# Patient Record
Sex: Female | Born: 1940 | Race: White | Hispanic: No | Smoking: Former smoker
Health system: Southern US, Community
[De-identification: ages and names within clinical notes are randomized; demographics above are authoritative.]

## PROBLEM LIST (undated history)

## (undated) DIAGNOSIS — F32A Depression, unspecified: Secondary | ICD-10-CM

## (undated) DIAGNOSIS — Z9889 Other specified postprocedural states: Secondary | ICD-10-CM

## (undated) DIAGNOSIS — J302 Other seasonal allergic rhinitis: Secondary | ICD-10-CM

## (undated) DIAGNOSIS — I10 Essential (primary) hypertension: Secondary | ICD-10-CM

## (undated) DIAGNOSIS — G8929 Other chronic pain: Secondary | ICD-10-CM

## (undated) DIAGNOSIS — R519 Headache, unspecified: Secondary | ICD-10-CM

## (undated) DIAGNOSIS — M1990 Unspecified osteoarthritis, unspecified site: Secondary | ICD-10-CM

## (undated) DIAGNOSIS — M545 Low back pain, unspecified: Secondary | ICD-10-CM

## (undated) DIAGNOSIS — E7800 Pure hypercholesterolemia, unspecified: Secondary | ICD-10-CM

## (undated) DIAGNOSIS — R51 Headache: Secondary | ICD-10-CM

## (undated) DIAGNOSIS — R112 Nausea with vomiting, unspecified: Secondary | ICD-10-CM

## (undated) DIAGNOSIS — F419 Anxiety disorder, unspecified: Secondary | ICD-10-CM

## (undated) DIAGNOSIS — R0600 Dyspnea, unspecified: Secondary | ICD-10-CM

## (undated) DIAGNOSIS — N183 Chronic kidney disease, stage 3 (moderate): Secondary | ICD-10-CM

## (undated) DIAGNOSIS — J189 Pneumonia, unspecified organism: Secondary | ICD-10-CM

## (undated) DIAGNOSIS — K219 Gastro-esophageal reflux disease without esophagitis: Secondary | ICD-10-CM

## (undated) DIAGNOSIS — E039 Hypothyroidism, unspecified: Secondary | ICD-10-CM

## (undated) DIAGNOSIS — Z8719 Personal history of other diseases of the digestive system: Secondary | ICD-10-CM

## (undated) DIAGNOSIS — N390 Urinary tract infection, site not specified: Secondary | ICD-10-CM

## (undated) DIAGNOSIS — E119 Type 2 diabetes mellitus without complications: Secondary | ICD-10-CM

## (undated) DIAGNOSIS — Z9289 Personal history of other medical treatment: Secondary | ICD-10-CM

## (undated) HISTORY — PX: CATARACT EXTRACTION W/ INTRAOCULAR LENS  IMPLANT, BILATERAL: SHX1307

## (undated) HISTORY — PX: TONSILLECTOMY: SUR1361

## (undated) HISTORY — PX: DILATION AND CURETTAGE OF UTERUS: SHX78

## (undated) HISTORY — DX: Unspecified osteoarthritis, unspecified site: M19.90

## (undated) HISTORY — PX: BREAST BIOPSY: SHX20

## (undated) HISTORY — DX: Gastro-esophageal reflux disease without esophagitis: K21.9

## (undated) HISTORY — PX: BACK SURGERY: SHX140

## (undated) HISTORY — PX: POSTERIOR CERVICAL FUSION/FORAMINOTOMY: SHX5038

---
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
## 1966-10-01 HISTORY — PX: PARTIAL HYSTERECTOMY: SHX80

## 1997-12-06 ENCOUNTER — Ambulatory Visit (HOSPITAL_COMMUNITY): Admission: RE | Admit: 1997-12-06 | Discharge: 1997-12-06 | Payer: Self-pay | Admitting: Obstetrics

## 1997-12-15 ENCOUNTER — Ambulatory Visit (HOSPITAL_COMMUNITY): Admission: RE | Admit: 1997-12-15 | Discharge: 1997-12-15 | Payer: Self-pay | Admitting: Obstetrics

## 1998-03-02 ENCOUNTER — Emergency Department (HOSPITAL_COMMUNITY): Admission: EM | Admit: 1998-03-02 | Discharge: 1998-03-02 | Payer: Self-pay | Admitting: Emergency Medicine

## 2000-05-30 ENCOUNTER — Ambulatory Visit (HOSPITAL_COMMUNITY): Admission: RE | Admit: 2000-05-30 | Discharge: 2000-05-30 | Payer: Self-pay | Admitting: *Deleted

## 2004-11-17 ENCOUNTER — Emergency Department (HOSPITAL_COMMUNITY): Admission: EM | Admit: 2004-11-17 | Discharge: 2004-11-17 | Payer: Self-pay | Admitting: Emergency Medicine

## 2005-10-10 ENCOUNTER — Ambulatory Visit: Payer: Self-pay | Admitting: Gastroenterology

## 2005-10-25 ENCOUNTER — Ambulatory Visit: Payer: Self-pay | Admitting: Gastroenterology

## 2005-10-25 HISTORY — PX: COLONOSCOPY: SHX174

## 2007-06-27 ENCOUNTER — Inpatient Hospital Stay (HOSPITAL_COMMUNITY): Admission: RE | Admit: 2007-06-27 | Discharge: 2007-06-29 | Payer: Self-pay | Admitting: Orthopaedic Surgery

## 2011-02-13 NOTE — Op Note (Signed)
NAME:  Amber Gray, Amber Gray           ACCOUNT NO.:  [id number]   MEDICAL RECORD NO.:  [id number]          PATIENT TYPE:  INP   LOCATION:  5038                         FACILITY:  [facility]   PHYSICIAN:  Mark C. Ophelia Charter, M.D.    DATE OF BIRTH:  June 24, 1941   DATE OF PROCEDURE:  06/27/2007  DATE OF DISCHARGE:  06/29/2007                               OPERATIVE REPORT   PREOPERATIVE DIAGNOSIS:  Severe spinal stenosis, C4-5, C5-6 with C6-7  herniated nucleus pulposus.   POSTOPERATIVE DIAGNOSIS:  Severe spinal stenosis, C4-5, C5-6 with C6-7  herniated nucleus pulposus.   PROCEDURE:  C5 corpectomy.  C4-5 and C6-7 microdiskectomy and  decompression.  C6-7 diskectomy and fusion.  Iliac crest structural  graft for C4-C6 fusion and iliac crest bone graft for C6-7 fusion.  C4-  C7 anterior cervical plating and screws.   SURGEON:  Mark C. Ophelia Charter, M.D.   ASSISTANT:  Maud Deed, PA-C   ANESTHESIA:  General plus local.   SKIN CLOSURE:  Subcuticular.   DRAINS:  One Hemovac drain in iliac crest, one Hemovac drain in anterior  neck.   This 70 year old female has had progressive spondylosis with stenosis  and has stenosis 4/5 to 6 mm away from the C4-5, C5-6 level.  She had a  focal disk herniation at C6-7 with mild-to-moderate spondylosis at this  level.  Due to the long area of stenosis, simple diskectomy alone was  not elected, and the patient required corpectomy in order to decompress  the areas of stenosis for decompression of the cervical spinal cord.   PROCEDURE:  After induction of general anesthesia, head halter traction,  horseshoe head holder, arms tucked to the side, a gel bag underneath the  left iliac crest, standard prepping and draping was performed.  Preoperative antibiotics were given.  Timeout procedure was taken after  draping.  A sterile skin marker had been used on the neck and Betadine  Vi-Drape.  Sterile Mayo stand at the head and thyroid sheets and drapes.   Incision was  made with transverse incision starting at the midline and  extending to the left.  The platysma was divided in line with the  fibers.  Dissection down to the level of longus colli was performed, and  considerable undermining underneath the platysma was performed since  this required a three-level exposure.  Cross-table lateral C-arm was  used after draping for confirmation of the appropriate level.  Diskectomy was performed at C4-5 and C5-6.  The operative microscope was  draped and brought in.  The bur was used.  A combination of bone wax,  FloSeal, and thrombin-soaked Gelfoam was used for hemostasis.  Corpectomy was performed using a combination of the bur after diskectomy  was performed on each end.  The posterior longitudinal ligament was  taken down using the operative microscope, and then the bone of C5 was  burred away.  Once the bone was thinned down to the posterior cortex, it  was removed with a Kerrison rongeur, with care taken to make sure that  the dura was free and was not adherent.  There was significant bone spur  overlap at C4-5 and C5-6 and ligaments and spurs removed.  The  uncovertebral joints were stripped with the Cloward curettes as well as  the Carlen curettes.  Once the dura was completely exposed after the  corpectomy and spurs removed at the C4-5 and C5-6 level, a larger patty  was placed moist over the dura.  After irrigation, diskectomy was  performed at C6-7 level.  At this level, the disk was herniated and a  portion of it extruded through the ligament. Once the ligament was taken  down, all disk was removed.  Spurs removed with 1 and 2-mm Kerrisons.  After the dura was decompressed, an incision was made over the left  iliac crest.  A bone graft was harvested initially based on measurements  made in the neck for the single large corpectomy level.  Bone was cut a  little extra long so that the single level at C6-7 could be harvested  from that and was trimmed.   Bone plugs were placed at the C6-7 level,  with the good stability tricortical based on depth gauge measurement.  Bone plug had to be thinned slightly with the power bur and burred down  on the ends gradually until finally with the Halter traction it would  fit in.  Care was taken not to make it loose and also to make sure that  there was no overdistraction when it was inserted.  Basically about 3 mm  of extra length from the measurements with mild traction was used.  This  gave a good restoration of the original height, considering there had  been collapse with the spondylosis.  The graft was secure.  The patty  had been removed from the dura and field was dry and dura was intact.  Palpation with a black nerve hook along the edges showed no areas of  remaining compression.  After graft was secure, the plate was selected  and screws were inserted.  A Vue[employer] EBI plate was selected.  No  screws were placed in the corpectomy graft.  C-arm was used, AP and  lateral, to confirm good placement of the plate, screws, and corpectomy  graft as well as the C6-7 graft.  After irrigation with saline solution,  a Hemovac was placed with the in-and-out technique using the trocar in  line with the skin incision.  The platysma was closed with 3-0 Vicryl  and 4-0 Vicryl subcuticular closure.  Tincture of Benzoin, Steri-Strips.  Iliac crest had a drain placed.  Edges of the bone graft that was  harvested were rounded and smoothed.  Deep fascia closed with 0 Vicryl  and 2-0 in the subcutaneous tissue, subcuticular skin closure, and  postop dressing.  Instrument and needle count was correct.  The patient  was transferred to the recovery room and was neurologically intact in  the recovery room.      Mark C. Ophelia Charter, M.D.  Electronically Signed     MCY/MEDQ  D:  08/01/2007  T:  08/01/2007  Job:  [id number]

## 2011-07-12 LAB — APTT: aPTT: 30

## 2011-07-12 LAB — BASIC METABOLIC PANEL
BUN: 10
BUN: 5 — ABNORMAL LOW
CO2: 22
CO2: 26
Calcium: 9.2
Chloride: 106
Chloride: 108
Creatinine, Ser: 0.83
GFR calc Af Amer: >60
GFR calc non Af Amer: >60
Glucose, Bld: 110 — ABNORMAL HIGH
Glucose, Bld: 140 — ABNORMAL HIGH
Potassium: 3.9
Potassium: 4.2
Sodium: 140

## 2011-07-12 LAB — URINALYSIS, ROUTINE W REFLEX MICROSCOPIC
Ketones, ur: NEGATIVE
Nitrite: NEGATIVE
Protein, ur: NEGATIVE
Urobilinogen, UA: 0.2
pH: 6.5

## 2011-07-12 LAB — DIFFERENTIAL
Basophils Absolute: 0
Lymphocytes Relative: 32
Monocytes Absolute: 0.5
Neutro Abs: 5.4

## 2011-07-12 LAB — CBC
Hemoglobin: 14
RBC: 4.54
RDW: 13

## 2011-07-12 LAB — URINE MICROSCOPIC-ADD ON

## 2012-09-25 ENCOUNTER — Observation Stay (HOSPITAL_COMMUNITY)
Admission: EM | Admit: 2012-09-25 | Discharge: 2012-09-26 | Disposition: A | Discharge status: Home / Self Care | Payer: Medicare HMO | Attending: Emergency Medicine | Admitting: Emergency Medicine

## 2012-09-25 ENCOUNTER — Emergency Department (HOSPITAL_COMMUNITY): Payer: Medicare HMO

## 2012-09-25 ENCOUNTER — Encounter (HOSPITAL_COMMUNITY): Payer: Self-pay | Admitting: *Deleted

## 2012-09-25 DIAGNOSIS — I10 Essential (primary) hypertension: Secondary | ICD-10-CM | POA: Insufficient documentation

## 2012-09-25 DIAGNOSIS — F411 Generalized anxiety disorder: Secondary | ICD-10-CM | POA: Insufficient documentation

## 2012-09-25 DIAGNOSIS — E039 Hypothyroidism, unspecified: Secondary | ICD-10-CM | POA: Insufficient documentation

## 2012-09-25 DIAGNOSIS — R300 Dysuria: Secondary | ICD-10-CM | POA: Insufficient documentation

## 2012-09-25 DIAGNOSIS — E785 Hyperlipidemia, unspecified: Secondary | ICD-10-CM | POA: Insufficient documentation

## 2012-09-25 DIAGNOSIS — Z79899 Other long term (current) drug therapy: Secondary | ICD-10-CM | POA: Insufficient documentation

## 2012-09-25 DIAGNOSIS — R5381 Other malaise: Secondary | ICD-10-CM | POA: Insufficient documentation

## 2012-09-25 DIAGNOSIS — R079 Chest pain, unspecified: Principal | ICD-10-CM | POA: Insufficient documentation

## 2012-09-25 DIAGNOSIS — R5383 Other fatigue: Secondary | ICD-10-CM | POA: Insufficient documentation

## 2012-09-25 HISTORY — DX: Essential (primary) hypertension: I10

## 2012-09-25 HISTORY — DX: Pure hypercholesterolemia, unspecified: E78.00

## 2012-09-25 HISTORY — DX: Hypothyroidism, unspecified: E03.9

## 2012-09-25 LAB — URINALYSIS, ROUTINE W REFLEX MICROSCOPIC
Glucose, UA: NEGATIVE mg/dL
Hgb urine dipstick: NEGATIVE
Ketones, ur: 15 mg/dL — AB
Protein, ur: NEGATIVE mg/dL
Urobilinogen, UA: 1 mg/dL (ref 0.0–1.0)

## 2012-09-25 LAB — POCT I-STAT TROPONIN I
Troponin i, poc: 0 ng/mL (ref 0.00–0.08)
Troponin i, poc: 0 ng/mL (ref 0.00–0.08)

## 2012-09-25 LAB — COMPREHENSIVE METABOLIC PANEL
Alkaline Phosphatase: 67 U/L (ref 39–117)
BUN: 11 mg/dL (ref 6–23)
CO2: 20 mEq/L (ref 19–32)
Calcium: 9.2 mg/dL (ref 8.4–10.5)
GFR calc Af Amer: >90 mL/min (ref >90)
GFR calc non Af Amer: 81 mL/min — ABNORMAL LOW (ref >90)
Glucose, Bld: 110 mg/dL — ABNORMAL HIGH (ref 70–99)
Potassium: 3.7 mEq/L (ref 3.5–5.1)
Total Protein: 8.1 g/dL (ref 6.0–8.3)

## 2012-09-25 LAB — CBC WITH DIFFERENTIAL/PLATELET
Eosinophils Absolute: 0.1 10*3/uL (ref 0.0–0.7)
Eosinophils Relative: 1 % (ref 0–5)
HCT: 40.5 % (ref 36.0–46.0)
Hemoglobin: 14.3 g/dL (ref 12.0–15.0)
Lymphocytes Relative: 31 % (ref 12–46)
Lymphs Abs: 2.7 10*3/uL (ref 0.7–4.0)
MCH: 31 pg (ref 26.0–34.0)
MCV: 87.9 fL (ref 78.0–100.0)
Monocytes Relative: 7 % (ref 3–12)
RBC: 4.61 MIL/uL (ref 3.87–5.11)

## 2012-09-25 LAB — URINE MICROSCOPIC-ADD ON

## 2012-09-25 LAB — TROPONIN I: Troponin I: <0.3 ng/mL (ref <0.30)

## 2012-09-25 MED ORDER — CIPROFLOXACIN IN D5W 400 MG/200ML IV SOLN
400.0000 mg | Freq: Once | INTRAVENOUS | Status: AC
  Administered 2012-09-25: 400 mg via INTRAVENOUS
  Filled 2012-09-25: qty 200

## 2012-09-25 MED ORDER — ZOLPIDEM TARTRATE 5 MG PO TABS: 5.0000 mg | ORAL_TABLET | Freq: Every evening | ORAL | Status: DC | PRN

## 2012-09-25 MED ORDER — MORPHINE SULFATE 4 MG/ML IJ SOLN
4.0000 mg | Freq: Once | INTRAMUSCULAR | Status: AC
  Administered 2012-09-25: 4 mg via INTRAVENOUS
  Filled 2012-09-25: qty 1

## 2012-09-25 MED ORDER — ASPIRIN 325 MG PO TABS
325.0000 mg | ORAL_TABLET | Freq: Once | ORAL | Status: AC
  Administered 2012-09-25: 325 mg via ORAL
  Filled 2012-09-25: qty 1

## 2012-09-25 MED ORDER — ALPRAZOLAM 0.5 MG PO TABS
0.5000 mg | ORAL_TABLET | Freq: Once | ORAL | Status: AC
  Administered 2012-09-26: 0.5 mg via ORAL
  Filled 2012-09-25: qty 1

## 2012-09-25 NOTE — ED Notes (Signed)
Dr Rhunette Croft notified that pt took home medication and has requested something to help her sleep tonight.

## 2012-09-25 NOTE — ED Provider Notes (Signed)
History     CSN: [id number]  Arrival date & time 09/25/12  1129   First MD Initiated Contact with Patient 09/25/12 1301      Chief Complaint  Patient presents with  . Weakness  . Shortness of Breath    (Consider location/radiation/quality/duration/timing/severity/associated sxs/prior treatment) The history is provided by the patient.  Amber Gray is a 71 y.o. female hx of HTN, HL here with SOB, chest pain. She felt weak for several days. + SOB since yesterday. She said that she gets these symptoms during holidays and felt anxious. No hx of CAD or stents. No cardiologist. + dysuria but no fevers. Patient not suicidal or homicidal.     Past Medical History  Diagnosis Date  . Hypertension   . Hypercholesteremia   . Hypothyroid     History reviewed. No pertinent past surgical history.  History reviewed. No pertinent family history.  History  Substance Use Topics  . Smoking status: Never Smoker   . Smokeless tobacco: Not on file  . Alcohol Use: No    OB History    Grav Para Term Preterm Abortions TAB SAB Ect Mult Living                  Review of Systems  Respiratory: Positive for shortness of breath.   Neurological: Positive for weakness.  All other systems reviewed and are negative.    Allergies  Review of patient's allergies indicates no known allergies.  Home Medications   Current Outpatient Rx  Name  Route  Sig  Dispense  Refill  . ALBUTEROL SULFATE HFA 108 (90 BASE) MCG/ACT IN AERS   Inhalation   Inhale 2 puffs into the lungs every 6 (six) hours as needed. For wheezing.         Marland Kitchen ALPRAZOLAM 0.5 MG PO TABS   Oral   Take 0.25-0.5 mg by mouth at bedtime as needed. For sleep.         . OMEGA-3 180-270 MG PO CAPS   Oral   Take 1 capsule by mouth daily.         Marland Kitchen LEVOTHYROXINE SODIUM 75 MCG PO TABS   Oral   Take 75 mcg by mouth daily.         Marland Kitchen LISINOPRIL 20 MG PO TABS   Oral   Take 20 mg by mouth daily.         Marland Kitchen SIMVASTATIN  20 MG PO TABS   Oral   Take 20 mg by mouth daily.           BP 151/62  Pulse 64  Temp 98.2 F (36.8 C)  Resp 14  SpO2 99%  Physical Exam  Nursing note and vitals reviewed. Constitutional: She is oriented to person, place, and time. She appears well-developed.       Anxious, crying   HENT:  Head: Normocephalic.  Mouth/Throat: Oropharynx is clear and moist.  Eyes: Conjunctivae normal are normal. Pupils are equal, round, and reactive to light.  Neck: Normal range of motion. Neck supple.  Cardiovascular: Normal rate, regular rhythm and normal heart sounds.   Pulmonary/Chest: Effort normal and breath sounds normal. No respiratory distress.       ? Crackles on bases   Abdominal: Soft. Bowel sounds are normal. She exhibits no distension. There is no tenderness. There is no rebound.  Musculoskeletal: Normal range of motion.  Neurological: She is alert and oriented to person, place, and time.  Skin: Skin is warm and dry.  Psychiatric: She has a normal mood and affect. Her behavior is normal. Judgment and thought content normal.    ED Course  Procedures (including critical care time)  Labs Reviewed  COMPREHENSIVE METABOLIC PANEL - Abnormal; Notable for the following:    Glucose, Bld 110 (*)     GFR calc non Af Amer 81 (*)     All other components within normal limits  PRO B NATRIURETIC PEPTIDE - Abnormal; Notable for the following:    Pro B Natriuretic peptide (BNP) 133.3 (*)     All other components within normal limits  URINALYSIS, ROUTINE W REFLEX MICROSCOPIC - Abnormal; Notable for the following:    Ketones, ur 15 (*)     Leukocytes, UA MODERATE (*)     All other components within normal limits  URINE MICROSCOPIC-ADD ON - Abnormal; Notable for the following:    Squamous Epithelial / LPF FEW (*)     Bacteria, UA FEW (*)     All other components within normal limits  CBC WITH DIFFERENTIAL  TROPONIN I  URINE CULTURE   Dg Chest 2 View  09/25/2012  *RADIOLOGY REPORT*   Clinical Data: Weakness and short of breath  CHEST - 2 VIEW  Comparison: 06/26/2007  Findings: Heart size is mildly enlarged.  Negative for heart failure.  The lungs are clear without infiltrate or effusion or mass lesion.  Mild degenerative changes in the thoracic spine. Chronic fracture approximately T12 is unchanged.  IMPRESSION: No active cardiopulmonary abnormality.   Original Report Authenticated By: Janeece Riggers, M.D.      No diagnosis found.   Date: 09/25/2012  Rate: 86  Rhythm: normal sinus rhythm  QRS Axis: normal  Intervals: normal  ST/T Wave abnormalities: nonspecific ST changes  Conduction Disutrbances:none  Narrative Interpretation:   Old EKG Reviewed: none available    MDM  Amber Gray is a 71 y.o. female here with SOB, chest pain. Likely related to anxiety. Will get trop and BNP given crackles in lung bases to r/o CHF. Will reassess.   4:27 PM Patient tearful in the room. Trop neg x 1. CXR nl. BNP nl. She has UTI on UA and is given cipro. I still think she is likely to have anxiety related chest pain but she will need further workup in the CDU. I ordered CT angio coronary to be done tomorrow. I gave report to CDU PA.          Richardean Canal, MD 09/25/12 [phone]

## 2012-09-25 NOTE — ED Notes (Signed)
Daughter at bedside patient seems anxious instructed on breathing techniques

## 2012-09-25 NOTE — ED Notes (Signed)
Report taken from Robin, RN.

## 2012-09-25 NOTE — ED Notes (Signed)
Pt denies pain currently; pt denies numbness and tingling; pt mentating appropriately. Pt states shortness of breath is better. Pt states she took her medication from home--pt showed bottle that pill was taken from simvastatin 20mg ; pt states one pill taken.

## 2012-09-25 NOTE — ED Notes (Signed)
Pt very vague.  Reports that her mother passed away in 06-Mar-2004 and she feels like she is doing the same.  States that she has been feeling weak and SOB x 2 days.  Pt very anxious.  NAD noted.  Pt denies pain.  Pt ambulatory without difficulty, speaking in complete sentences.

## 2012-09-25 NOTE — ED Notes (Signed)
Dr Rhunette Croft notified that pt normally takes Xanax at home to help sleep as needed. Rhunette Croft states he will change the order

## 2012-09-26 ENCOUNTER — Observation Stay (HOSPITAL_COMMUNITY): Payer: Medicare HMO

## 2012-09-26 MED ORDER — OMEPRAZOLE 20 MG PO CPDR: 20.0000 mg | DELAYED_RELEASE_CAPSULE | Freq: Two times a day (BID) | ORAL | Status: DC

## 2012-09-26 MED ORDER — METOPROLOL TARTRATE 25 MG PO TABS
50.0000 mg | ORAL_TABLET | Freq: Once | ORAL | Status: AC
  Administered 2012-09-26: 50 mg via ORAL
  Filled 2012-09-26: qty 2

## 2012-09-26 MED ORDER — IOHEXOL 350 MG/ML SOLN
80.0000 mL | Freq: Once | INTRAVENOUS | Status: AC | PRN
  Administered 2012-09-26: 80 mL via INTRAVENOUS

## 2012-09-26 MED ORDER — METOPROLOL TARTRATE 1 MG/ML IV SOLN
INTRAVENOUS | Status: AC
  Filled 2012-09-26: qty 10

## 2012-09-26 MED ORDER — NITROGLYCERIN 0.4 MG SL SUBL
SUBLINGUAL_TABLET | SUBLINGUAL | Status: AC
  Administered 2012-09-26: 0.4 mg via SUBLINGUAL
  Filled 2012-09-26: qty 25

## 2012-09-26 MED ORDER — NITROGLYCERIN 0.4 MG SL SUBL
0.4000 mg | SUBLINGUAL_TABLET | Freq: Once | SUBLINGUAL | Status: AC
  Administered 2012-09-26: 0.4 mg via SUBLINGUAL

## 2012-09-26 NOTE — ED Provider Notes (Signed)
In CDU under CPP with report negative for cardiac disease per Dr. Kearney Hard. Discussed findings with the patient. Recommend Prilosec twice daily for symptoms of possible reflux and follow up with PCP in one week.    Amber Medin, PA-C 09/26/12 1046

## 2012-09-26 NOTE — ED Notes (Signed)
BMI= 27.3 ( WEIGHT= 140 LBS. / HEIGHT 5' ) .

## 2012-09-26 NOTE — Progress Notes (Signed)
Utilization review completed.  P.J. Gustin Zobrist,RN,BSN Case Manager [phone]  

## 2012-09-27 LAB — URINE CULTURE: Colony Count: 45000

## 2012-09-27 NOTE — ED Provider Notes (Signed)
Medical screening examination/treatment/procedure(s) were performed by non-physician practitioner and as supervising physician I was immediately available for consultation/collaboration.  Aarian Cleaver, MD 09/27/12 0741 

## 2012-09-29 NOTE — Progress Notes (Signed)
UR completed for CDU observation pt 

## 2014-10-26 DIAGNOSIS — E039 Hypothyroidism, unspecified: Secondary | ICD-10-CM | POA: Diagnosis not present

## 2014-10-26 DIAGNOSIS — Z0000 Encounter for general adult medical examination without abnormal findings: Secondary | ICD-10-CM | POA: Diagnosis not present

## 2014-10-26 DIAGNOSIS — E785 Hyperlipidemia, unspecified: Secondary | ICD-10-CM | POA: Diagnosis not present

## 2014-10-26 DIAGNOSIS — R7302 Impaired glucose tolerance (oral): Secondary | ICD-10-CM | POA: Diagnosis not present

## 2014-10-26 DIAGNOSIS — I10 Essential (primary) hypertension: Secondary | ICD-10-CM | POA: Diagnosis not present

## 2014-10-26 DIAGNOSIS — N390 Urinary tract infection, site not specified: Secondary | ICD-10-CM | POA: Diagnosis not present

## 2014-11-02 DIAGNOSIS — Z1389 Encounter for screening for other disorder: Secondary | ICD-10-CM | POA: Diagnosis not present

## 2014-11-02 DIAGNOSIS — E663 Overweight: Secondary | ICD-10-CM | POA: Diagnosis not present

## 2014-11-02 DIAGNOSIS — K219 Gastro-esophageal reflux disease without esophagitis: Secondary | ICD-10-CM | POA: Diagnosis not present

## 2014-11-02 DIAGNOSIS — E785 Hyperlipidemia, unspecified: Secondary | ICD-10-CM | POA: Diagnosis not present

## 2014-11-02 DIAGNOSIS — F419 Anxiety disorder, unspecified: Secondary | ICD-10-CM | POA: Diagnosis not present

## 2014-11-02 DIAGNOSIS — Z0000 Encounter for general adult medical examination without abnormal findings: Secondary | ICD-10-CM | POA: Diagnosis not present

## 2014-11-02 DIAGNOSIS — R0609 Other forms of dyspnea: Secondary | ICD-10-CM | POA: Diagnosis not present

## 2014-11-02 DIAGNOSIS — I10 Essential (primary) hypertension: Secondary | ICD-10-CM | POA: Diagnosis not present

## 2014-11-02 DIAGNOSIS — R609 Edema, unspecified: Secondary | ICD-10-CM | POA: Diagnosis not present

## 2014-11-04 DIAGNOSIS — Z1212 Encounter for screening for malignant neoplasm of rectum: Secondary | ICD-10-CM | POA: Diagnosis not present

## 2015-02-01 DIAGNOSIS — M1990 Unspecified osteoarthritis, unspecified site: Secondary | ICD-10-CM | POA: Diagnosis not present

## 2015-02-01 DIAGNOSIS — E663 Overweight: Secondary | ICD-10-CM | POA: Diagnosis not present

## 2015-02-01 DIAGNOSIS — I10 Essential (primary) hypertension: Secondary | ICD-10-CM | POA: Diagnosis not present

## 2015-02-01 DIAGNOSIS — F419 Anxiety disorder, unspecified: Secondary | ICD-10-CM | POA: Diagnosis not present

## 2015-02-01 DIAGNOSIS — Z780 Asymptomatic menopausal state: Secondary | ICD-10-CM | POA: Diagnosis not present

## 2015-02-01 DIAGNOSIS — R9431 Abnormal electrocardiogram [ECG] [EKG]: Secondary | ICD-10-CM | POA: Diagnosis not present

## 2015-02-01 DIAGNOSIS — E119 Type 2 diabetes mellitus without complications: Secondary | ICD-10-CM | POA: Diagnosis not present

## 2015-02-01 DIAGNOSIS — R609 Edema, unspecified: Secondary | ICD-10-CM | POA: Diagnosis not present

## 2015-02-01 DIAGNOSIS — E039 Hypothyroidism, unspecified: Secondary | ICD-10-CM | POA: Diagnosis not present

## 2015-02-09 DIAGNOSIS — R140 Abdominal distension (gaseous): Secondary | ICD-10-CM | POA: Diagnosis not present

## 2015-02-09 DIAGNOSIS — R9431 Abnormal electrocardiogram [ECG] [EKG]: Secondary | ICD-10-CM | POA: Diagnosis not present

## 2015-02-09 DIAGNOSIS — R0609 Other forms of dyspnea: Secondary | ICD-10-CM | POA: Diagnosis not present

## 2015-02-09 DIAGNOSIS — R609 Edema, unspecified: Secondary | ICD-10-CM | POA: Diagnosis not present

## 2015-05-03 DIAGNOSIS — Z6829 Body mass index (BMI) 29.0-29.9, adult: Secondary | ICD-10-CM | POA: Diagnosis not present

## 2015-05-03 DIAGNOSIS — R140 Abdominal distension (gaseous): Secondary | ICD-10-CM | POA: Diagnosis not present

## 2015-05-03 DIAGNOSIS — E785 Hyperlipidemia, unspecified: Secondary | ICD-10-CM | POA: Diagnosis not present

## 2015-05-03 DIAGNOSIS — E119 Type 2 diabetes mellitus without complications: Secondary | ICD-10-CM | POA: Diagnosis not present

## 2015-05-03 DIAGNOSIS — I10 Essential (primary) hypertension: Secondary | ICD-10-CM | POA: Diagnosis not present

## 2015-05-03 DIAGNOSIS — E039 Hypothyroidism, unspecified: Secondary | ICD-10-CM | POA: Diagnosis not present

## 2015-06-27 ENCOUNTER — Encounter (HOSPITAL_COMMUNITY): Payer: Self-pay | Admitting: Emergency Medicine

## 2015-06-27 ENCOUNTER — Observation Stay (HOSPITAL_COMMUNITY)
Admission: EM | Admit: 2015-06-27 | Discharge: 2015-06-27 | Disposition: A | Discharge status: Home / Self Care | Payer: Commercial Managed Care - HMO | Attending: Internal Medicine | Admitting: Internal Medicine

## 2015-06-27 ENCOUNTER — Emergency Department (HOSPITAL_COMMUNITY): Payer: Commercial Managed Care - HMO

## 2015-06-27 ENCOUNTER — Observation Stay (HOSPITAL_COMMUNITY): Payer: Commercial Managed Care - HMO

## 2015-06-27 ENCOUNTER — Observation Stay (HOSPITAL_BASED_OUTPATIENT_CLINIC_OR_DEPARTMENT_OTHER): Payer: Commercial Managed Care - HMO

## 2015-06-27 DIAGNOSIS — E039 Hypothyroidism, unspecified: Secondary | ICD-10-CM | POA: Diagnosis present

## 2015-06-27 DIAGNOSIS — I10 Essential (primary) hypertension: Secondary | ICD-10-CM | POA: Diagnosis present

## 2015-06-27 DIAGNOSIS — Z6830 Body mass index (BMI) 30.0-30.9, adult: Secondary | ICD-10-CM | POA: Insufficient documentation

## 2015-06-27 DIAGNOSIS — R609 Edema, unspecified: Secondary | ICD-10-CM | POA: Diagnosis not present

## 2015-06-27 DIAGNOSIS — R079 Chest pain, unspecified: Secondary | ICD-10-CM | POA: Diagnosis present

## 2015-06-27 DIAGNOSIS — R42 Dizziness and giddiness: Secondary | ICD-10-CM | POA: Diagnosis not present

## 2015-06-27 DIAGNOSIS — R072 Precordial pain: Principal | ICD-10-CM | POA: Insufficient documentation

## 2015-06-27 DIAGNOSIS — E11351 Type 2 diabetes mellitus with proliferative diabetic retinopathy with macular edema: Secondary | ICD-10-CM | POA: Diagnosis not present

## 2015-06-27 DIAGNOSIS — Z87891 Personal history of nicotine dependence: Secondary | ICD-10-CM | POA: Insufficient documentation

## 2015-06-27 DIAGNOSIS — R0602 Shortness of breath: Secondary | ICD-10-CM | POA: Insufficient documentation

## 2015-06-27 DIAGNOSIS — Z79899 Other long term (current) drug therapy: Secondary | ICD-10-CM | POA: Insufficient documentation

## 2015-06-27 DIAGNOSIS — R0789 Other chest pain: Secondary | ICD-10-CM | POA: Diagnosis not present

## 2015-06-27 DIAGNOSIS — E119 Type 2 diabetes mellitus without complications: Secondary | ICD-10-CM | POA: Diagnosis not present

## 2015-06-27 DIAGNOSIS — E663 Overweight: Secondary | ICD-10-CM | POA: Diagnosis not present

## 2015-06-27 DIAGNOSIS — R55 Syncope and collapse: Secondary | ICD-10-CM | POA: Diagnosis not present

## 2015-06-27 DIAGNOSIS — R404 Transient alteration of awareness: Secondary | ICD-10-CM | POA: Diagnosis not present

## 2015-06-27 DIAGNOSIS — E780 Pure hypercholesterolemia: Secondary | ICD-10-CM | POA: Insufficient documentation

## 2015-06-27 LAB — CBC WITH DIFFERENTIAL/PLATELET
Basophils Absolute: 0.1 10*3/uL (ref 0.0–0.1)
Basophils Relative: 1 %
Eosinophils Absolute: 0.2 10*3/uL (ref 0.0–0.7)
Eosinophils Relative: 2 %
HEMATOCRIT: 39.6 % (ref 36.0–46.0)
HEMOGLOBIN: 13.3 g/dL (ref 12.0–15.0)
LYMPHS ABS: 2.8 10*3/uL (ref 0.7–4.0)
LYMPHS PCT: 32 %
MCH: 30.4 pg (ref 26.0–34.0)
MCHC: 33.6 g/dL (ref 30.0–36.0)
MCV: 90.4 fL (ref 78.0–100.0)
MONO ABS: 0.5 10*3/uL (ref 0.1–1.0)
MONOS PCT: 6 %
NEUTROS ABS: 5.2 10*3/uL (ref 1.7–7.7)
NEUTROS PCT: 59 %
Platelets: 204 10*3/uL (ref 150–400)
RBC: 4.38 MIL/uL (ref 3.87–5.11)
RDW: 12.9 % (ref 11.5–15.5)
WBC: 8.7 10*3/uL (ref 4.0–10.5)

## 2015-06-27 LAB — NM MYOCAR MULTI W/SPECT W/WALL MOTION / EF
CHL CUP NUCLEAR SDS: 3
CHL CUP NUCLEAR SSS: 6
LHR: 0
LVDIAVOL: 50 mL
LVSYSVOL: 10 mL
Rest HR: 60 {beats}/min
SRS: 3
TID: 1.36

## 2015-06-27 LAB — TROPONIN I
Troponin I: <0.03 ng/mL (ref <0.031)
Troponin I: <0.03 ng/mL (ref <0.031)

## 2015-06-27 LAB — BASIC METABOLIC PANEL
Anion gap: 8 (ref 5–15)
BUN: 11 mg/dL (ref 6–20)
CALCIUM: 9 mg/dL (ref 8.9–10.3)
CHLORIDE: 106 mmol/L (ref 101–111)
CO2: 25 mmol/L (ref 22–32)
CREATININE: 1.04 mg/dL — AB (ref 0.44–1.00)
GFR calc Af Amer: 59 mL/min — ABNORMAL LOW (ref >60)
GFR calc non Af Amer: 51 mL/min — ABNORMAL LOW (ref >60)
GLUCOSE: 147 mg/dL — AB (ref 65–99)
Potassium: 3.8 mmol/L (ref 3.5–5.1)
Sodium: 139 mmol/L (ref 135–145)

## 2015-06-27 LAB — I-STAT TROPONIN, ED: TROPONIN I, POC: 0 ng/mL (ref 0.00–0.08)

## 2015-06-27 LAB — LIPID PANEL
CHOL/HDL RATIO: 3.9 ratio
CHOLESTEROL: 174 mg/dL (ref 0–200)
HDL: 45 mg/dL (ref >40)
LDL Cholesterol: 93 mg/dL (ref 0–99)
TRIGLYCERIDES: 178 mg/dL — AB (ref <150)
VLDL: 36 mg/dL (ref 0–40)

## 2015-06-27 LAB — MAGNESIUM: MAGNESIUM: 2.4 mg/dL (ref 1.7–2.4)

## 2015-06-27 LAB — BRAIN NATRIURETIC PEPTIDE: B Natriuretic Peptide: 28.1 pg/mL (ref 0.0–100.0)

## 2015-06-27 MED ORDER — REGADENOSON 0.4 MG/5ML IV SOLN
INTRAVENOUS | Status: AC
  Administered 2015-06-27: 0.4 mg via INTRAVENOUS
  Filled 2015-06-27: qty 5

## 2015-06-27 MED ORDER — SIMVASTATIN 20 MG PO TABS: 20.0000 mg | ORAL_TABLET | Freq: Every day | ORAL | Status: DC

## 2015-06-27 MED ORDER — INFLUENZA VAC SPLIT QUAD 0.5 ML IM SUSY: 0.5000 mL | PREFILLED_SYRINGE | INTRAMUSCULAR | Status: DC

## 2015-06-27 MED ORDER — TECHNETIUM TC 99M SESTAMIBI GENERIC - CARDIOLITE
10.0000 | Freq: Once | INTRAVENOUS | Status: AC | PRN
  Administered 2015-06-27: 10 via INTRAVENOUS

## 2015-06-27 MED ORDER — TECHNETIUM TC 99M SESTAMIBI - CARDIOLITE
30.0000 | Freq: Once | INTRAVENOUS | Status: AC | PRN
  Administered 2015-06-27: 30 via INTRAVENOUS

## 2015-06-27 MED ORDER — ONDANSETRON HCL 4 MG/2ML IJ SOLN: 4.0000 mg | Freq: Four times a day (QID) | INTRAMUSCULAR | Status: DC | PRN

## 2015-06-27 MED ORDER — REGADENOSON 0.4 MG/5ML IV SOLN
0.4000 mg | Freq: Once | INTRAVENOUS | Status: AC
  Administered 2015-06-27: 0.4 mg via INTRAVENOUS
  Filled 2015-06-27: qty 5

## 2015-06-27 MED ORDER — ALPRAZOLAM 0.25 MG PO TABS: 0.2500 mg | ORAL_TABLET | Freq: Three times a day (TID) | ORAL | Status: DC | PRN

## 2015-06-27 MED ORDER — LISINOPRIL 10 MG PO TABS
20.0000 mg | ORAL_TABLET | Freq: Two times a day (BID) | ORAL | Status: DC
  Administered 2015-06-27: 20 mg via ORAL
  Filled 2015-06-27: qty 2

## 2015-06-27 MED ORDER — ACETAMINOPHEN 500 MG PO TABS
1000.0000 mg | ORAL_TABLET | Freq: Four times a day (QID) | ORAL | Status: DC | PRN
  Administered 2015-06-27: 1000 mg via ORAL
  Filled 2015-06-27: qty 2

## 2015-06-27 MED ORDER — ASPIRIN 325 MG PO TABS
325.0000 mg | ORAL_TABLET | Freq: Once | ORAL | Status: DC
  Filled 2015-06-27: qty 1

## 2015-06-27 MED ORDER — LEVOTHYROXINE SODIUM 75 MCG PO TABS
75.0000 ug | ORAL_TABLET | Freq: Every day | ORAL | Status: DC
  Administered 2015-06-27: 75 ug via ORAL
  Filled 2015-06-27: qty 1

## 2015-06-27 MED ORDER — ACETAMINOPHEN 325 MG PO TABS: 650.0000 mg | ORAL_TABLET | Freq: Four times a day (QID) | ORAL | Status: DC | PRN

## 2015-06-27 MED ORDER — GI COCKTAIL ~~LOC~~: 30.0000 mL | Freq: Four times a day (QID) | ORAL | Status: DC | PRN

## 2015-06-27 MED ORDER — NITROGLYCERIN 0.4 MG SL SUBL
0.4000 mg | SUBLINGUAL_TABLET | SUBLINGUAL | Status: DC | PRN
  Filled 2015-06-27: qty 1

## 2015-06-27 MED ORDER — SODIUM CHLORIDE 0.9 % IV BOLUS (SEPSIS): 1000.0000 mL | Freq: Once | INTRAVENOUS | Status: DC

## 2015-06-27 MED ORDER — FUROSEMIDE 20 MG PO TABS
20.0000 mg | ORAL_TABLET | Freq: Every day | ORAL | Status: DC
  Administered 2015-06-27: 20 mg via ORAL
  Filled 2015-06-27: qty 1

## 2015-06-27 NOTE — H&P (Addendum)
History and Physical  Amber Gray  EUM:[id number]  DOB: 05/16/1941  DOA: 06/27/2015  Referring physician: Everlene Balls, MD PCP: Precious Reel, MD   Chief Complaint: "I got this chest pain and I was so scared about it."  HPI: Amber Gray is a 74 y.o. female with a past medical history significant for hypertension, NIDDM, and hypothyroidism who presents with an episode of substernal chest discomfort.  The patient was in her usual state of health until tonight around suppertime when she was sitting on the couch getting ready for bed and she had onset of central chest pressure that was moderate in intensity did not radiate but was associated with feeling nausea and subjective dyspnea. She took some allergy medicine and her inhaler and the sensation subsided gradually. She went to bed but then woke up around midnight felt dizzy, shaky, and pain over her whole body, and called EMS.  In the ED, the patient's ECG showed old T-wave inversions in anterior and inferior leads, her initial troponin was negative, and a chest x-ray and BNP level were normal.  She was admitted to [facility] for chest pain rule out.   Review of Systems:  Patient seen 4:56 AM on 06/27/2015. Pt complains of headache, resolved chest discomfort. Otherwise, twelve systems were reviewed and were negative except as noted above in the history of present illness.  Past Medical History  Diagnosis Date  . Hypertension   . Hypercholesteremia   . Hypothyroid   . Diabetes mellitus without complication   The above past medical history was reviewed.  Past Surgical History  Procedure Laterality Date  . Cervical laminectomy    The above surgical history was reviewed.   Social History:  reports that she has never smoked. She does not have any smokeless tobacco history on file. She reports that she does not drink alcohol or use illicit drugs. Patient lives alone.  She is retired from [employer]. She is a former smoker  but quit 50 years ago. She does not drink alcohol. She is independent with all ADLs.  No Known Allergies  Family History  Problem Relation Age of Onset  . Alzheimer's disease Mother   . Heart attack Father   . Dwarfism Sister     Prior to admission medications: The patient believes she takes a dose of levothyroxine, a hypertension medicine, a diabetes medicine that starts with T, and a diuretic. She does not know any of these medicines or their doses. Her pharmacy is closed.     Physical Exam: BP 177/65 mmHg  Pulse 62  Temp(Src) 98 F (36.7 C) (Oral)  Resp 16  Ht 5' (1.524 m)  Wt 67.132 kg (148 lb)  BMI 28.90 kg/m2  SpO2 96% General: Elderly adult female, tearful, alert and in mild distress from anxiety.  Responds appropriately to questions.  Eye contact, dress and hygiene appropriate. HEENT: Head normal.  Corneas clear, conjunctivae somewhat injected and sclerae normal without icterus, lids and lashes normal.  PERRL and EOMI.  Nose normal.    Skin: Warm and dry.  No jaundice.  No suspicious rashes or lesions. Cardiac: RRR, nl S1-S2, no murmurs appreciated.  Capillary refill is less than 2 seconds.  JVP normal.  0+ pitting ankle edema.  Radial and DP pulses 2+ and symmetric. Respiratory: Normal respiratory rate and rhythm.  CTAB without rales or wheezes. Abdomen: BS present.  No TTP or rebound all quadrants.  No masses or organomegaly. No ascites, distension. Neuro: Sensorium intact.  Cranial nerves 3-12 intact.  Speech is fluent.  Naming is grossly intact, and the patient's recall, recent and remote, as well as general fund of knowledge seem within normal limits.  Muscle bulk normal.  5/5 grip strength and elbow and wrist flexion/extension, symmetrically.  5/5 hip flexion symmetrically.  Moves all extremities equally and with normal coordination.    Psych: Tearful affect.  Normal rate and rhythm of speech.  Thought content appropriate, and thought process linear.  No evidence of  aural or visual hallucinations or delusions. Attention and concentration are normal.           Labs on Admission:  The metabolic panel is notable for normal sodium, normal potassium, normal bicarbonate, and normal renal function. An initial troponin and BNP is normal. Magnesium is 2.4 mmol per liter. The complete blood count is notable for absence of leukocytosis, anemia or thrombocytopenia.      Radiological Exams on Admission: Personally reviewed: Dg Chest 2 View  06/27/2015   CLINICAL DATA:  Centralized chest pain.  Shortness of breath.  EXAM: CHEST  2 VIEW  COMPARISON:  Radiographs 09/25/2012  FINDINGS: Lung volumes are low. The cardiomediastinal contours are unchanged with stable cardiomegaly allowing for differences in technique. Hiatal hernia is again seen. Pulmonary vasculature is normal. No consolidation, pleural effusion, or pneumothorax. No acute osseous abnormalities are seen. Chronic compression deformity of T12, stable. Post surgical change in the lower cervical spine, partially included.  IMPRESSION: Hypoventilatory chest without acute abnormality.   Electronically Signed   By: Jeb Levering M.D.   On: 06/27/2015 03:33    EKG: Independently reviewed. NSR with TWI in V1-2 and II, which are stable from 2013 when the patient had a CP rule out and normal coronary angiogram.    Assessment/Plan Present on Admission:  . Hypertension . Hypothyroidism . Chest pain   1. Chest pain:  The patient's pain is atypical in that it was non-exertional, but is described as substernal pressure associated with nausea and dyspnea in a patient whose risk factors are age, hypertension, diabetes, and a remote history of smoking. Although I think it likely she had a panic attack, either spontaneously or possibly triggered by reflux, I recommend observation and evaluation by Cardiology for possible expedited stress testing.  -Telemetry -Serial troponins -Consult to cardiology, appreciate  recommendations regarding stress testing   2. Hypertension, diabetes, hypothyroidism: -Continue home lisinopril 20 mg -Continue home simvastatin -Continue home levothyroxine -Continue home furosemide -Hemoglobin A1c and lipid panel this morning.      DVT PPx: Low risk Diet: NPO in the morning Consultants: Cardiology Code Status: Full  Disposition Plan:  Admission for observation is judged to be reasonable and necessary at this time because the patient's presenting symptoms, physical exam findings, and initial radiographic and laboratory data in the context of chronic comorbidities is felt to place him/her at high risk for further clinical deterioration but it is anticipated at this time that the patient may be medically stable for discharge from the hospital within 2 midnights of admission.  A. The patient's presenting symptoms include chest pain B. The patient's chronic comorbidities include diabetes, hypertension and overweight.      Edwin Dada [facility] Pager [phone]

## 2015-06-27 NOTE — Consult Note (Signed)
CARDIOLOGY CONSULT NOTE  Patient ID: Amber Gray, MRN: [id number], DOB/AGE: [id number] 75 y.o. Admit date: 06/27/2015 Date of Consult: 06/27/2015  Primary Physician: Precious Reel, MD Primary Cardiologist: none Referring Physician: Dr Loleta Books  Chief Complaint: Chest Pain  Reason for Consultation: Chest Pain  HPI: 74 year old woman with no history of cardiac disease who presented overnight with chest pain. Chronic medical problems include hypertension and diabetes. Last night after dinner, the patient began to experience a headache. She checked her blood pressure with an automated wrist cuff and noted that her blood pressure was elevated in the range of 170/86. She then began to develop substernal chest discomfort which was nonradiating. She took some allergy medication but this did not help her symptoms. She called EMS because she was concerned about her blood pressure and chest discomfort. She describes the chest discomfort as a pressure-like sensation, nonradiating, associated with nausea and shortness of breath. Her symptoms resolved over the course of 30-45 minutes without intervention. She specifically did not receive sublingual nitroglycerin. She was evaluated in the emergency department and overnight observation with etiology consultation was recommended. She does have episodes of chest pressure on occasion. These have not been related to physical exertion. She denies problems with gastroesophageal reflux disease. She denies a prominent family history of cardiac disease. Her father had an unidentified heart problem in his older years. No specific history of CAD in the family.  Initial evaluation has demonstrated normal troponin values, chest x-ray, BNP, and no significant changes on EKG. The patient is chest pain-free this morning.  Medical History:  Past Medical History  Diagnosis Date  . Hypertension   . Hypercholesteremia   . Hypothyroid   . Diabetes mellitus without  complication       Surgical History:  Past Surgical History  Procedure Laterality Date  . Cervical laminectomy       Home Meds: Prior to Admission medications   Medication Sig Start Date End Date Taking? Authorizing Provider  acetaminophen (TYLENOL) 325 MG tablet Take 650 mg by mouth every 6 (six) hours as needed for mild pain.   Yes Historical Provider, MD  albuterol (PROVENTIL HFA;VENTOLIN HFA) 108 (90 BASE) MCG/ACT inhaler Inhale 2 puffs into the lungs every 6 (six) hours as needed. For wheezing.   Yes Historical Provider, MD  fexofenadine (ALLEGRA) 30 MG tablet Take 30 mg by mouth daily.   Yes Historical Provider, MD  furosemide (LASIX) 20 MG tablet Take 20 mg by mouth daily. 05/31/15  Yes Historical Provider, MD  levothyroxine (SYNTHROID, LEVOTHROID) 75 MCG tablet Take 75 mcg by mouth daily.   Yes Historical Provider, MD  lisinopril (PRINIVIL,ZESTRIL) 20 MG tablet Take 20 mg by mouth 2 (two) times daily. 05/31/15  Yes Historical Provider, MD  MELATONIN PO Take 1 capsule by mouth at bedtime.   Yes Historical Provider, MD  simvastatin (ZOCOR) 20 MG tablet Take 20 mg by mouth daily. 06/03/15  Yes Historical Provider, MD    Inpatient Medications:  . aspirin  325 mg Oral Once  . furosemide  20 mg Oral Daily  . [START ON 06/28/2015] Influenza vac split quadrivalent PF  0.5 mL Intramuscular Tomorrow-1000  . levothyroxine  75 mcg Oral QAC breakfast  . lisinopril  20 mg Oral BID  . simvastatin  20 mg Oral Daily      Allergies: No Known Allergies  Social History   Social History  . Marital Status: Single    Spouse Name: N/A  . Number of Children: N/A  .  Years of Education: N/A   Occupational History  . Not on file.   Social History Main Topics  . Smoking status: Former Smoker    Types: Cigarettes    Quit date: 10/01/1964  . Smokeless tobacco: Not on file  . Alcohol Use: No  . Drug Use: No  . Sexual Activity: No   Other Topics Concern  . Not on file   Social History  Narrative     Family History  Problem Relation Age of Onset  . Alzheimer's disease Mother   . Heart attack Father   . Dwarfism Sister      Review of Systems: General: negative for chills, fever, night sweats or weight changes.  ENT: negative for rhinorrhea or epistaxis Cardiovascular: HPI Dermatological: negative for rash Respiratory: negative for cough or wheezing GI: negative for vomiting, diarrhea, bright red blood per rectum, melena, or hematemesis. Positive for nausea GU: no hematuria, urgency, or frequency Neurologic: negative for visual changes, syncope, or dizziness. Positive headache Heme: no easy bruising or bleeding Endo: negative for excessive thirst, thyroid disorder, or flushing Musculoskeletal: positive for arthritis in her hands All other systems reviewed and are otherwise negative except as noted above.  Physical Exam: Blood pressure 155/50, pulse 58, temperature 98 F (36.7 C), temperature source Oral, resp. rate 16, height 5' (1.524 m), weight 153 lb 10.6 oz (69.7 kg), SpO2 99 %. Pt is alert and oriented, WD, WN, pleasant elderly woman in no distress. HEENT: normal Neck: JVP normal. Carotid upstrokes normal without bruits. No thyromegaly. Lungs: equal expansion, clear bilaterally CV: Apex is discrete and nondisplaced, RRR without murmur or gallop Abd: soft, NT, +BS, no bruit, no hepatosplenomegaly Back: no CVA tenderness Ext: trace pretibial edema        DP/PT pulses intact and = Skin: warm and dry without rash Neuro: CNII-XII intact             Strength intact = bilaterally    Labs:  Recent Labs  06/27/15 0640  TROPONINI <0.03   Lab Results  Component Value Date   WBC 8.7 06/27/2015   HGB 13.3 06/27/2015   HCT 39.6 06/27/2015   MCV 90.4 06/27/2015   PLT 204 06/27/2015    Recent Labs Lab 06/27/15 0249  NA 139  K 3.8  CL 106  CO2 25  BUN 11  CREATININE 1.04*  CALCIUM 9.0  GLUCOSE 147*   Lab Results  Component Value Date   CHOL  174 06/27/2015   HDL 45 06/27/2015   LDLCALC 93 06/27/2015   TRIG 178* 06/27/2015   No results found for: DDIMER  Radiology/Studies:  Dg Chest 2 View  06/27/2015   CLINICAL DATA:  Centralized chest pain.  Shortness of breath.  EXAM: CHEST  2 VIEW  COMPARISON:  Radiographs 09/25/2012  FINDINGS: Lung volumes are low. The cardiomediastinal contours are unchanged with stable cardiomegaly allowing for differences in technique. Hiatal hernia is again seen. Pulmonary vasculature is normal. No consolidation, pleural effusion, or pneumothorax. No acute osseous abnormalities are seen. Chronic compression deformity of T12, stable. Post surgical change in the lower cervical spine, partially included.  IMPRESSION: Hypoventilatory chest without acute abnormality.   Electronically Signed   By: Jeb Levering M.D.   On: 06/27/2015 03:33    EKG: NSR, nonspecific T wave changes, otherwise normal  Cardiac Studies: CXR, labs reviewed  ASSESSMENT AND PLAN:  Precordial Chest Pain: Typical and atypical features. This is on a background of hypertension, hyperlipidemia, and diabetes. No objective evidence of  ischemia or infarction. I discussed diagnostic options with the patient. She clearly has at least a moderate risk of obstructive CAD considering her background risk factors and age. I have recommended a Lexiscan Myoview stress test for further risk stratification. Will order for this morning. Further plans pending the results of her stress test. She is intolerant to ASA and would add at discharge only if CAD is identified on stress testing.  Other problems of hypertension, hyperlipidemia, and diabetes appear to be stable and the patient follows regularly with Dr Virgina Jock.   SignedSherren Mocha MD, [facility] 06/27/2015, 8:22 AM

## 2015-06-27 NOTE — ED Notes (Signed)
Per EMS, pt started feeling lightheaded when standing around midnight. Pt denied LOC or pain. Pt is now stating that she was having chest pain earlier this evening with SOB. Pt in NAD at this time.

## 2015-06-27 NOTE — ED Notes (Signed)
MD at bedside. 

## 2015-06-27 NOTE — Progress Notes (Signed)
   06/27/15 1112  Clinical Encounter Type  Visited With Patient  Visit Type Initial;Spiritual support  Referral From Nurse  CHaplain was set to visit with Pt; Chaplain noticed Pt was not in room; Chaplain intends to return

## 2015-06-27 NOTE — Progress Notes (Signed)
Lexiscan Myoview performed, pt tolerated well, final results pending.  Kerin Ransom PA-C 06/27/2015 11:07 AM

## 2015-06-27 NOTE — Progress Notes (Signed)
06/27/2015 1700 Discharge AVS meds taken today and those due this evening reviewed.  Follow-up appointments and when to call md reviewed.  D/C IV and TELE.  Questions and concerns addressed.   D/C home per orders. [place]

## 2015-06-27 NOTE — ED Provider Notes (Signed)
CSN: [id number]     Arrival date & time 06/27/15  0220 History   First MD Initiated Contact with Patient 06/27/15 0229     Chief Complaint  Patient presents with  . Near Syncope  . Dizziness  . Shortness of Breath     (Consider location/radiation/quality/duration/timing/severity/associated sxs/prior Treatment) HPI  Amber Gray is a 74 y.o. female with past history of hypertension, hyperlipidemia, diabetes, presenting today with chest pain. Patient states it began when she went to lay down. She describes substernal chest pain, is described as pain without any other descriptors. She has shortness of breath as well. She denies vomiting or diaphoresis. Since then she feels lightheaded when standing. She denies syncope. She has bilateral large of edema which she states is unchanged. Her chest pain has improved but is still mildly present. She has no further complaints.  10 Systems reviewed and are negative for acute change except as noted in the HPI.     Past Medical History  Diagnosis Date  . Hypertension   . Hypercholesteremia   . Hypothyroid    No past surgical history on file. No family history on file. Social History  Substance Use Topics  . Smoking status: Never Smoker   . Smokeless tobacco: Not on file  . Alcohol Use: No   OB History    No data available     Review of Systems    Allergies  Review of patient's allergies indicates no known allergies.  Home Medications   Prior to Admission medications   Medication Sig Start Date End Date Taking? Authorizing Provider  albuterol (PROVENTIL HFA;VENTOLIN HFA) 108 (90 BASE) MCG/ACT inhaler Inhale 2 puffs into the lungs every 6 (six) hours as needed. For wheezing.    Historical Provider, MD  ALPRAZolam Duanne Moron) 0.5 MG tablet Take 0.25-0.5 mg by mouth at bedtime as needed. For sleep.    Historical Provider, MD  Docosahexaenoic Acid-EPA (OMEGA-3) 180-270 MG CAPS Take 1 capsule by mouth daily.    Historical Provider, MD   levothyroxine (SYNTHROID, LEVOTHROID) 75 MCG tablet Take 75 mcg by mouth daily.    Historical Provider, MD  lisinopril (PRINIVIL,ZESTRIL) 20 MG tablet Take 20 mg by mouth daily.    Historical Provider, MD  omeprazole (PRILOSEC) 20 MG capsule Take 1 capsule (20 mg total) by mouth 2 (two) times daily. 09/26/12   Charlann Lange, PA-C  simvastatin (ZOCOR) 20 MG tablet Take 20 mg by mouth daily.    Historical Provider, MD   There were no vitals taken for this visit. Physical Exam  Constitutional: She is oriented to person, place, and time. She appears well-developed and well-nourished. No distress.  HENT:  Head: Normocephalic and atraumatic.  Nose: Nose normal.  Mouth/Throat: Oropharynx is clear and moist. No oropharyngeal exudate.  Eyes: Conjunctivae and EOM are normal. Pupils are equal, round, and reactive to light. No scleral icterus.  Neck: Normal range of motion. Neck supple. No JVD present. No tracheal deviation present. No thyromegaly present.  Cardiovascular: Normal rate, regular rhythm and normal heart sounds.  Exam reveals no gallop and no friction rub.   No murmur heard. Pulmonary/Chest: Effort normal and breath sounds normal. No respiratory distress. She has no wheezes. She exhibits no tenderness.  Abdominal: Soft. Bowel sounds are normal. She exhibits no distension and no mass. There is no tenderness. There is no rebound and no guarding.  Musculoskeletal: Normal range of motion. She exhibits edema. She exhibits no tenderness.  Lymphadenopathy:    She has no cervical  adenopathy.  Neurological: She is alert and oriented to person, place, and time. No cranial nerve deficit. She exhibits normal muscle tone.  Skin: Skin is warm and dry. No rash noted. No erythema. No pallor.  Nursing note and vitals reviewed.   ED Course  Procedures (including critical care time) Labs Review Labs Reviewed  BASIC METABOLIC PANEL - Abnormal; Notable for the following:    Glucose, Bld 147 (*)     Creatinine, Ser 1.04 (*)    GFR calc non Af Amer 51 (*)    GFR calc Af Amer 59 (*)    All other components within normal limits  LIPID PANEL - Abnormal; Notable for the following:    Triglycerides 178 (*)    All other components within normal limits  CBC WITH DIFFERENTIAL/PLATELET  BRAIN NATRIURETIC PEPTIDE  MAGNESIUM  TROPONIN I  TROPONIN I  TROPONIN I  HEMOGLOBIN A1C  I-STAT TROPOININ, ED    Imaging Review Dg Chest 2 View  06/27/2015   CLINICAL DATA:  Centralized chest pain.  Shortness of breath.  EXAM: CHEST  2 VIEW  COMPARISON:  Radiographs 09/25/2012  FINDINGS: Lung volumes are low. The cardiomediastinal contours are unchanged with stable cardiomegaly allowing for differences in technique. Hiatal hernia is again seen. Pulmonary vasculature is normal. No consolidation, pleural effusion, or pneumothorax. No acute osseous abnormalities are seen. Chronic compression deformity of T12, stable. Post surgical change in the lower cervical spine, partially included.  IMPRESSION: Hypoventilatory chest without acute abnormality.   Electronically Signed   By: Jeb Levering M.D.   On: 06/27/2015 03:33   Nm Myocar Multi W/spect W/wall Motion / Ef  06/27/2015    There was no ST segment deviation noted during stress.  No T wave inversion was noted during stress.  The study is normal.  This is a low risk study.  The left ventricular ejection fraction is hyperdynamic (>65%).  Nuclear stress EF: 81%.   Low risk stress nuclear study with normal perfusion and normal left  ventricular regional and global systolic function.    I have personally reviewed and evaluated these images and lab results as part of my medical decision-making.   EKG Interpretation   Date/Time:  Monday June 27 2015 02:37:55 EDT Ventricular Rate:  64 PR Interval:    QRS Duration: 96 QT Interval:  433 QTC Calculation: 447 R Axis:   0 Text Interpretation:  Normal sinus rhythm Artifact Low voltage, precordial  leads  Borderline T abnormalities, anterior leads No significant change  since last tracing Confirmed by Glynn Octave [phone]) on 06/27/2015  2:54:25 AM      MDM   Final diagnoses:  None   Patient presents to the ED for CP, SOB, light headedness.  I have concern for ACS.  Patient will require inpatient work up.  EKG shows mild changes.  Troponin negative.  ASA given.  Everlene Balls, MD 06/27/15 1715

## 2015-06-27 NOTE — Progress Notes (Signed)
Myoview read as normal (Dr Sallyanne Kuster). Further work up per Dr Broadus John.  Kerin Ransom PA-C 06/27/2015 3:21 PM

## 2015-06-28 LAB — HEMOGLOBIN A1C
HEMOGLOBIN A1C: 7.8 % — AB (ref 4.8–5.6)
MEAN PLASMA GLUCOSE: 177 mg/dL

## 2015-06-28 NOTE — Discharge Summary (Signed)
Physician Discharge Summary  Amber Gray ZOX:[id number] DOB: Jun 04, 1941 DOA: 06/27/2015  PCP: Precious Reel, MD  Admit date: 06/27/2015 Discharge date: 06/27/2015  Time spent: 45 minutes  Recommendations for Outpatient Follow-up:  1. PCP in 1 week  Discharge Diagnoses:  Principal Problem:   Chest pain Active Problems:   Type 2 diabetes mellitus   Hypertension   Hypothyroidism   Precordial chest pain   Discharge Condition: stable  Diet recommendation: diabetic  Filed Weights   06/27/15 0228 06/27/15 0600  Weight: 67.132 kg (148 lb) 69.7 kg (153 lb 10.6 oz)    History of present illness:  Chief Complaint: "I got this chest pain and I was so scared about it."  HPI: Amber Gray is a 74 y.o. female with a past medical history significant for hypertension, NIDDM, and hypothyroidism who presents with an episode of substernal chest discomfort.  The patient was in her usual state of health until tonight around suppertime when she was sitting on the couch getting ready for bed and she had onset of central chest pressure that was moderate in intensity did not radiate but was associated with feeling nausea and subjective dyspnea. She took some allergy medicine and her inhaler and the sensation subsided gradually. She went to bed but then woke up around midnight felt dizzy, shaky, and pain over her whole body, and called EMS  Hospital Course:  Atypical chest pain -ruled out for ACS, EKG and Troponin unremarkable -resolved -seen by Cardiology and underwent Myoview which was negative for ischemia -discharged home in a stable condition  DM -stable, not on meds  HTN -continue lisinopril and Lasix  Procedures:  Myoview: There was no ST segment deviation noted during stress.  No T wave inversion was noted during stress.  The study is normal.  This is a low risk study.  The left ventricular ejection fraction is hyperdynamic (>65%).  Nuclear stress EF:  81%.   Low risk stress nuclear study with normal perfusion and normal left ventricular regional and global systolic function.  Consultations:  Cardiology  Discharge Exam: Filed Vitals:   06/27/15 1425  BP: 117/56  Pulse: 67  Temp: 97.4 F (36.3 C)  Resp: 18    General: AAOx3 Cardiovascular: S1S2/RRR Respiratory: CTAB  Discharge Instructions   Discharge Instructions    Diet - low sodium heart healthy    Complete by:  As directed      Diet Carb Modified    Complete by:  As directed      Increase activity slowly    Complete by:  As directed           Discharge Medication List as of 06/27/2015  4:48 PM    CONTINUE these medications which have NOT CHANGED   Details  acetaminophen (TYLENOL) 325 MG tablet Take 650 mg by mouth every 6 (six) hours as needed for mild pain., Until Discontinued, Historical Med    albuterol (PROVENTIL HFA;VENTOLIN HFA) 108 (90 BASE) MCG/ACT inhaler Inhale 2 puffs into the lungs every 6 (six) hours as needed. For wheezing., Until Discontinued, Historical Med    fexofenadine (ALLEGRA) 30 MG tablet Take 30 mg by mouth daily., Until Discontinued, Historical Med    furosemide (LASIX) 20 MG tablet Take 20 mg by mouth daily., Starting 05/31/2015, Until Discontinued, Historical Med    levothyroxine (SYNTHROID, LEVOTHROID) 75 MCG tablet Take 75 mcg by mouth daily., Until Discontinued, Historical Med    lisinopril (PRINIVIL,ZESTRIL) 20 MG tablet Take 20 mg by mouth 2 (two)  times daily., Starting 05/31/2015, Until Discontinued, Historical Med    MELATONIN PO Take 1 capsule by mouth at bedtime., Until Discontinued, Historical Med    simvastatin (ZOCOR) 20 MG tablet Take 20 mg by mouth daily., Starting 06/03/2015, Until Discontinued, Historical Med      STOP taking these medications     ALPRAZolam (XANAX) 0.5 MG tablet        No Known Allergies Follow-up Information    Follow up with Precious Reel, MD In 1 week.   Specialty:  Internal Medicine    Contact information:   [place] [address] [phone]        The results of significant diagnostics from this hospitalization (including imaging, microbiology, ancillary and laboratory) are listed below for reference.    Significant Diagnostic Studies: Dg Chest 2 View  06/27/2015   CLINICAL DATA:  Centralized chest pain.  Shortness of breath.  EXAM: CHEST  2 VIEW  COMPARISON:  Radiographs 09/25/2012  FINDINGS: Lung volumes are low. The cardiomediastinal contours are unchanged with stable cardiomegaly allowing for differences in technique. Hiatal hernia is again seen. Pulmonary vasculature is normal. No consolidation, pleural effusion, or pneumothorax. No acute osseous abnormalities are seen. Chronic compression deformity of T12, stable. Post surgical change in the lower cervical spine, partially included.  IMPRESSION: Hypoventilatory chest without acute abnormality.   Electronically Signed   By: Jeb Levering M.D.   On: 06/27/2015 03:33   Nm Myocar Multi W/spect W/wall Motion / Ef  06/27/2015    There was no ST segment deviation noted during stress.  No T wave inversion was noted during stress.  The study is normal.  This is a low risk study.  The left ventricular ejection fraction is hyperdynamic (>65%).  Nuclear stress EF: 81%.   Low risk stress nuclear study with normal perfusion and normal left  ventricular regional and global systolic function.     Microbiology: No results found for this or any previous visit (from the past 240 hour(s)).   Labs: Basic Metabolic Panel:  Recent Labs Lab 06/27/15 0249  NA 139  K 3.8  CL 106  CO2 25  GLUCOSE 147*  BUN 11  CREATININE 1.04*  CALCIUM 9.0  MG 2.4   Liver Function Tests: No results for input(s): AST, ALT, ALKPHOS, BILITOT, PROT, ALBUMIN in the last 168 hours. No results for input(s): LIPASE, AMYLASE in the last 168 hours. No results for input(s): AMMONIA in the last 168 hours. CBC:  Recent  Labs Lab 06/27/15 0249  WBC 8.7  NEUTROABS 5.2  HGB 13.3  HCT 39.6  MCV 90.4  PLT 204   Cardiac Enzymes:  Recent Labs Lab 06/27/15 0640 06/27/15 0850 06/27/15 1256  TROPONINI <0.03 <0.03 <0.03   BNP: BNP (last 3 results)  Recent Labs  06/27/15 0249  BNP 28.1    ProBNP (last 3 results) No results for input(s): PROBNP in the last 8760 hours.  CBG: No results for input(s): GLUCAP in the last 168 hours.     SignedDomenic Polite  [facility] 06/28/2015, 1:14 PM

## 2015-07-23 DIAGNOSIS — S50862A Insect bite (nonvenomous) of left forearm, initial encounter: Secondary | ICD-10-CM | POA: Diagnosis not present

## 2015-07-23 DIAGNOSIS — S50362A Insect bite (nonvenomous) of left elbow, initial encounter: Secondary | ICD-10-CM | POA: Diagnosis not present

## 2015-07-23 DIAGNOSIS — S60862A Insect bite (nonvenomous) of left wrist, initial encounter: Secondary | ICD-10-CM | POA: Diagnosis not present

## 2015-07-26 DIAGNOSIS — Z6828 Body mass index (BMI) 28.0-28.9, adult: Secondary | ICD-10-CM | POA: Diagnosis not present

## 2015-07-26 DIAGNOSIS — E785 Hyperlipidemia, unspecified: Secondary | ICD-10-CM | POA: Diagnosis not present

## 2015-07-26 DIAGNOSIS — F419 Anxiety disorder, unspecified: Secondary | ICD-10-CM | POA: Diagnosis not present

## 2015-07-26 DIAGNOSIS — R079 Chest pain, unspecified: Secondary | ICD-10-CM | POA: Diagnosis not present

## 2015-07-26 DIAGNOSIS — E119 Type 2 diabetes mellitus without complications: Secondary | ICD-10-CM | POA: Diagnosis not present

## 2015-07-26 DIAGNOSIS — E039 Hypothyroidism, unspecified: Secondary | ICD-10-CM | POA: Diagnosis not present

## 2015-07-26 DIAGNOSIS — I10 Essential (primary) hypertension: Secondary | ICD-10-CM | POA: Diagnosis not present

## 2015-08-04 DIAGNOSIS — I10 Essential (primary) hypertension: Secondary | ICD-10-CM | POA: Diagnosis not present

## 2015-08-04 DIAGNOSIS — E785 Hyperlipidemia, unspecified: Secondary | ICD-10-CM | POA: Diagnosis not present

## 2015-08-04 DIAGNOSIS — R140 Abdominal distension (gaseous): Secondary | ICD-10-CM | POA: Diagnosis not present

## 2015-08-04 DIAGNOSIS — R079 Chest pain, unspecified: Secondary | ICD-10-CM | POA: Diagnosis not present

## 2015-08-04 DIAGNOSIS — F419 Anxiety disorder, unspecified: Secondary | ICD-10-CM | POA: Diagnosis not present

## 2015-08-04 DIAGNOSIS — E119 Type 2 diabetes mellitus without complications: Secondary | ICD-10-CM | POA: Diagnosis not present

## 2015-08-04 DIAGNOSIS — E039 Hypothyroidism, unspecified: Secondary | ICD-10-CM | POA: Diagnosis not present

## 2015-08-04 DIAGNOSIS — Z6829 Body mass index (BMI) 29.0-29.9, adult: Secondary | ICD-10-CM | POA: Diagnosis not present

## 2015-11-04 DIAGNOSIS — I10 Essential (primary) hypertension: Secondary | ICD-10-CM | POA: Diagnosis not present

## 2015-11-04 DIAGNOSIS — E038 Other specified hypothyroidism: Secondary | ICD-10-CM | POA: Diagnosis not present

## 2015-11-04 DIAGNOSIS — Z0000 Encounter for general adult medical examination without abnormal findings: Secondary | ICD-10-CM | POA: Diagnosis not present

## 2015-11-04 DIAGNOSIS — N390 Urinary tract infection, site not specified: Secondary | ICD-10-CM | POA: Diagnosis not present

## 2015-11-04 DIAGNOSIS — E119 Type 2 diabetes mellitus without complications: Secondary | ICD-10-CM | POA: Diagnosis not present

## 2015-11-11 DIAGNOSIS — E784 Other hyperlipidemia: Secondary | ICD-10-CM | POA: Diagnosis not present

## 2015-11-11 DIAGNOSIS — Z1389 Encounter for screening for other disorder: Secondary | ICD-10-CM | POA: Diagnosis not present

## 2015-11-11 DIAGNOSIS — F418 Other specified anxiety disorders: Secondary | ICD-10-CM | POA: Diagnosis not present

## 2015-11-11 DIAGNOSIS — Z23 Encounter for immunization: Secondary | ICD-10-CM | POA: Diagnosis not present

## 2015-11-11 DIAGNOSIS — Z6829 Body mass index (BMI) 29.0-29.9, adult: Secondary | ICD-10-CM | POA: Diagnosis not present

## 2015-11-11 DIAGNOSIS — R609 Edema, unspecified: Secondary | ICD-10-CM | POA: Diagnosis not present

## 2015-11-11 DIAGNOSIS — Z0000 Encounter for general adult medical examination without abnormal findings: Secondary | ICD-10-CM | POA: Diagnosis not present

## 2015-11-23 DIAGNOSIS — Z1212 Encounter for screening for malignant neoplasm of rectum: Secondary | ICD-10-CM | POA: Diagnosis not present

## 2015-11-25 ENCOUNTER — Encounter: Payer: Self-pay | Admitting: Gastroenterology

## 2015-12-08 DIAGNOSIS — Z23 Encounter for immunization: Secondary | ICD-10-CM | POA: Diagnosis not present

## 2016-01-16 DIAGNOSIS — H5200 Hypermetropia, unspecified eye: Secondary | ICD-10-CM | POA: Diagnosis not present

## 2016-01-16 DIAGNOSIS — H5210 Myopia, unspecified eye: Secondary | ICD-10-CM | POA: Diagnosis not present

## 2016-03-12 DIAGNOSIS — E663 Overweight: Secondary | ICD-10-CM | POA: Diagnosis not present

## 2016-03-12 DIAGNOSIS — E1122 Type 2 diabetes mellitus with diabetic chronic kidney disease: Secondary | ICD-10-CM | POA: Diagnosis not present

## 2016-03-12 DIAGNOSIS — E038 Other specified hypothyroidism: Secondary | ICD-10-CM | POA: Diagnosis not present

## 2016-03-12 DIAGNOSIS — R600 Localized edema: Secondary | ICD-10-CM | POA: Diagnosis not present

## 2016-03-12 DIAGNOSIS — Z6829 Body mass index (BMI) 29.0-29.9, adult: Secondary | ICD-10-CM | POA: Diagnosis not present

## 2016-03-12 DIAGNOSIS — N183 Chronic kidney disease, stage 3 (moderate): Secondary | ICD-10-CM | POA: Diagnosis not present

## 2016-03-12 DIAGNOSIS — F418 Other specified anxiety disorders: Secondary | ICD-10-CM | POA: Diagnosis not present

## 2016-03-12 DIAGNOSIS — I129 Hypertensive chronic kidney disease with stage 1 through stage 4 chronic kidney disease, or unspecified chronic kidney disease: Secondary | ICD-10-CM | POA: Diagnosis not present

## 2016-03-12 DIAGNOSIS — R5383 Other fatigue: Secondary | ICD-10-CM | POA: Diagnosis not present

## 2016-05-07 DIAGNOSIS — B351 Tinea unguium: Secondary | ICD-10-CM | POA: Diagnosis not present

## 2016-05-07 DIAGNOSIS — L600 Ingrowing nail: Secondary | ICD-10-CM | POA: Diagnosis not present

## 2016-05-07 DIAGNOSIS — E119 Type 2 diabetes mellitus without complications: Secondary | ICD-10-CM | POA: Diagnosis not present

## 2016-05-08 DIAGNOSIS — B351 Tinea unguium: Secondary | ICD-10-CM | POA: Diagnosis not present

## 2016-05-24 DIAGNOSIS — I10 Essential (primary) hypertension: Secondary | ICD-10-CM | POA: Diagnosis not present

## 2016-05-24 DIAGNOSIS — I129 Hypertensive chronic kidney disease with stage 1 through stage 4 chronic kidney disease, or unspecified chronic kidney disease: Secondary | ICD-10-CM | POA: Diagnosis not present

## 2016-05-24 DIAGNOSIS — L089 Local infection of the skin and subcutaneous tissue, unspecified: Secondary | ICD-10-CM | POA: Diagnosis not present

## 2016-05-24 DIAGNOSIS — Z6829 Body mass index (BMI) 29.0-29.9, adult: Secondary | ICD-10-CM | POA: Diagnosis not present

## 2016-07-09 DIAGNOSIS — Z6830 Body mass index (BMI) 30.0-30.9, adult: Secondary | ICD-10-CM | POA: Diagnosis not present

## 2016-07-09 DIAGNOSIS — N183 Chronic kidney disease, stage 3 (moderate): Secondary | ICD-10-CM | POA: Diagnosis not present

## 2016-07-09 DIAGNOSIS — R609 Edema, unspecified: Secondary | ICD-10-CM | POA: Diagnosis not present

## 2016-07-09 DIAGNOSIS — E1122 Type 2 diabetes mellitus with diabetic chronic kidney disease: Secondary | ICD-10-CM | POA: Diagnosis not present

## 2016-07-09 DIAGNOSIS — E038 Other specified hypothyroidism: Secondary | ICD-10-CM | POA: Diagnosis not present

## 2016-07-09 DIAGNOSIS — F418 Other specified anxiety disorders: Secondary | ICD-10-CM | POA: Diagnosis not present

## 2016-07-09 DIAGNOSIS — R0609 Other forms of dyspnea: Secondary | ICD-10-CM | POA: Diagnosis not present

## 2016-07-09 DIAGNOSIS — I129 Hypertensive chronic kidney disease with stage 1 through stage 4 chronic kidney disease, or unspecified chronic kidney disease: Secondary | ICD-10-CM | POA: Diagnosis not present

## 2016-07-17 DIAGNOSIS — Z961 Presence of intraocular lens: Secondary | ICD-10-CM | POA: Diagnosis not present

## 2016-07-17 DIAGNOSIS — Z23 Encounter for immunization: Secondary | ICD-10-CM | POA: Diagnosis not present

## 2016-07-17 DIAGNOSIS — E119 Type 2 diabetes mellitus without complications: Secondary | ICD-10-CM | POA: Diagnosis not present

## 2016-07-24 ENCOUNTER — Ambulatory Visit: Payer: Commercial Managed Care - HMO | Admitting: Sports Medicine

## 2016-08-14 ENCOUNTER — Ambulatory Visit (INDEPENDENT_AMBULATORY_CARE_PROVIDER_SITE_OTHER): Payer: Commercial Managed Care - HMO | Admitting: Sports Medicine

## 2016-08-14 ENCOUNTER — Encounter: Payer: Self-pay | Admitting: Sports Medicine

## 2016-08-14 DIAGNOSIS — M2040 Other hammer toe(s) (acquired), unspecified foot: Secondary | ICD-10-CM

## 2016-08-14 DIAGNOSIS — E1142 Type 2 diabetes mellitus with diabetic polyneuropathy: Secondary | ICD-10-CM

## 2016-08-14 DIAGNOSIS — M2142 Flat foot [pes planus] (acquired), left foot: Secondary | ICD-10-CM

## 2016-08-14 DIAGNOSIS — M2141 Flat foot [pes planus] (acquired), right foot: Secondary | ICD-10-CM | POA: Diagnosis not present

## 2016-08-14 DIAGNOSIS — M79671 Pain in right foot: Secondary | ICD-10-CM

## 2016-08-14 DIAGNOSIS — B351 Tinea unguium: Secondary | ICD-10-CM

## 2016-08-14 DIAGNOSIS — M79672 Pain in left foot: Secondary | ICD-10-CM | POA: Diagnosis not present

## 2016-08-14 DIAGNOSIS — M21619 Bunion of unspecified foot: Secondary | ICD-10-CM

## 2016-08-14 NOTE — Patient Instructions (Signed)

## 2016-08-14 NOTE — Progress Notes (Signed)
Subjective: Amber Gray is a 75 y.o. female patient with history of diabetes who presents to office today complaining of long, painful nails  while ambulating in shoes; unable to trim. Patient states that the glucose reading this morning was not recorded but last A1c was 7.6. Patient denies any new changes in medication or new problems. Patient denies any new cramping, numbness, burning or tingling in the legs. States that she always has pin and needles sensations to feet that she will discuss with PCP for possible medication.  Patient Active Problem List   Diagnosis Date Noted  . Type 2 diabetes mellitus ([place]) 06/27/2015  . Hypertension 06/27/2015  . Hypothyroidism 06/27/2015  . Chest pain 06/27/2015  . Precordial chest pain    Current Outpatient Prescriptions on File Prior to Visit  Medication Sig Dispense Refill  . acetaminophen (TYLENOL) 325 MG tablet Take 650 mg by mouth every 6 (six) hours as needed for mild pain.    Marland Kitchen albuterol (PROVENTIL HFA;VENTOLIN HFA) 108 (90 BASE) MCG/ACT inhaler Inhale 2 puffs into the lungs every 6 (six) hours as needed. For wheezing.    . fexofenadine (ALLEGRA) 30 MG tablet Take 30 mg by mouth daily.    . furosemide (LASIX) 20 MG tablet Take 20 mg by mouth daily.  11  . levothyroxine (SYNTHROID, LEVOTHROID) 75 MCG tablet Take 75 mcg by mouth daily.    Marland Kitchen lisinopril (PRINIVIL,ZESTRIL) 20 MG tablet Take 20 mg by mouth 2 (two) times daily.  6  . MELATONIN PO Take 1 capsule by mouth at bedtime.    . simvastatin (ZOCOR) 20 MG tablet Take 20 mg by mouth daily.  11   No current facility-administered medications on file prior to visit.    No Known Allergies  No results found for this or any previous visit (from the past 2160 hour(s)).  Objective: General: Patient is awake, alert, and oriented x 3 and in no acute distress.  Integument: Skin is warm, dry and supple bilateral. Nails are tender, long, thickened and dystrophic with subungual debris,  consistent with onychomycosis, 1-5 bilateral. No signs of infection. No open lesions or preulcerative lesions present bilateral. Remaining integument unremarkable.  Vasculature:  Dorsalis Pedis pulse 1/4 bilateral. Posterior Tibial pulse  1/4 bilateral.  Capillary fill time <3 sec 1-5 bilateral. Scant hair growth to the level of the digits.Temperature gradient within normal limits. Mild varicosities present bilateral. No edema present bilateral.   Neurology: The patient has diminished sensation measured with a 5.07/10g Semmes Weinstein Monofilament at all pedal sites bilateral. Vibratory sensation decreased bilateral with tuning fork. No Babinski sign present bilateral.   Musculoskeletal: Asymptomatic hammertoe, bunion, pes planus pedal deformities noted bilateral. Muscular strength 5/5 in all lower extremity muscular groups bilateral without pain on range of motion. No tenderness with calf compression bilateral.  Assessment and Plan: Problem List Items Addressed This Visit    None    Visit Diagnoses    Dermatophytosis of nail    -  Primary   Diabetic polyneuropathy associated with type 2 diabetes mellitus (HCC)       Foot pain, bilateral       Bunion       Hammer toe, unspecified laterality       Pes planus of both feet          -Examined patient. -Discussed and educated patient on diabetic foot care, especially with  regards to the vascular, neurological and musculoskeletal systems.  -Stressed the importance of good glycemic control and the  detriment of not controlling glucose levels in relation to the foot. -Mechanically debrided all nails 1-5 bilateral using sterile nail nipper and filed with dremel without incident  -Recommend good supportive shoes for foot type -Patient to follow up with PCP for neuropathy medication. Meanwhile patient can use topicals. -Answered all patient questions -Patient to return  in 3 months for at risk foot care -Patient advised to call the office if  any problems or questions arise in the meantime.  Landis Martins, DPM

## 2016-08-17 DIAGNOSIS — E1122 Type 2 diabetes mellitus with diabetic chronic kidney disease: Secondary | ICD-10-CM | POA: Diagnosis not present

## 2016-08-17 DIAGNOSIS — M1990 Unspecified osteoarthritis, unspecified site: Secondary | ICD-10-CM | POA: Diagnosis not present

## 2016-08-17 DIAGNOSIS — I129 Hypertensive chronic kidney disease with stage 1 through stage 4 chronic kidney disease, or unspecified chronic kidney disease: Secondary | ICD-10-CM | POA: Diagnosis not present

## 2016-08-17 DIAGNOSIS — E1140 Type 2 diabetes mellitus with diabetic neuropathy, unspecified: Secondary | ICD-10-CM | POA: Diagnosis not present

## 2016-08-17 DIAGNOSIS — R0609 Other forms of dyspnea: Secondary | ICD-10-CM | POA: Diagnosis not present

## 2016-08-17 DIAGNOSIS — I10 Essential (primary) hypertension: Secondary | ICD-10-CM | POA: Diagnosis not present

## 2016-08-17 DIAGNOSIS — Z6830 Body mass index (BMI) 30.0-30.9, adult: Secondary | ICD-10-CM | POA: Diagnosis not present

## 2016-08-27 DIAGNOSIS — J018 Other acute sinusitis: Secondary | ICD-10-CM | POA: Diagnosis not present

## 2016-08-31 DIAGNOSIS — Z6830 Body mass index (BMI) 30.0-30.9, adult: Secondary | ICD-10-CM | POA: Diagnosis not present

## 2016-08-31 DIAGNOSIS — E1122 Type 2 diabetes mellitus with diabetic chronic kidney disease: Secondary | ICD-10-CM | POA: Diagnosis not present

## 2016-08-31 DIAGNOSIS — R5383 Other fatigue: Secondary | ICD-10-CM | POA: Diagnosis not present

## 2016-08-31 DIAGNOSIS — I10 Essential (primary) hypertension: Secondary | ICD-10-CM | POA: Diagnosis not present

## 2016-11-08 DIAGNOSIS — E038 Other specified hypothyroidism: Secondary | ICD-10-CM | POA: Diagnosis not present

## 2016-11-08 DIAGNOSIS — I10 Essential (primary) hypertension: Secondary | ICD-10-CM | POA: Diagnosis not present

## 2016-11-08 DIAGNOSIS — E784 Other hyperlipidemia: Secondary | ICD-10-CM | POA: Diagnosis not present

## 2016-11-08 DIAGNOSIS — E1122 Type 2 diabetes mellitus with diabetic chronic kidney disease: Secondary | ICD-10-CM | POA: Diagnosis not present

## 2016-11-13 ENCOUNTER — Ambulatory Visit (INDEPENDENT_AMBULATORY_CARE_PROVIDER_SITE_OTHER): Payer: Medicare HMO | Admitting: Sports Medicine

## 2016-11-13 ENCOUNTER — Encounter: Payer: Self-pay | Admitting: Sports Medicine

## 2016-11-13 DIAGNOSIS — M79672 Pain in left foot: Secondary | ICD-10-CM | POA: Diagnosis not present

## 2016-11-13 DIAGNOSIS — M79671 Pain in right foot: Secondary | ICD-10-CM | POA: Diagnosis not present

## 2016-11-13 DIAGNOSIS — E1142 Type 2 diabetes mellitus with diabetic polyneuropathy: Secondary | ICD-10-CM

## 2016-11-13 DIAGNOSIS — B351 Tinea unguium: Secondary | ICD-10-CM | POA: Diagnosis not present

## 2016-11-13 NOTE — Progress Notes (Signed)
Subjective: Amber Gray is a 76 y.o. female patient with history of diabetes who presents to office today complaining of long, painful nails  while ambulating in shoes; unable to trim. Patient states that the glucose reading this morning was not recorded. Patient denies any new changes in medication or new problems. Patient denies any new cramping, numbness, burning or tingling in the legs. Talked with her PCP about neuropathy medication, but she thinks that he thought it wasn't a big deal. Also states getting swelling in legs after doctor cut back on her water pill.  Patient Active Problem List   Diagnosis Date Noted  . Type 2 diabetes mellitus ([place]) 06/27/2015  . Hypertension 06/27/2015  . Hypothyroidism 06/27/2015  . Chest pain 06/27/2015  . Precordial chest pain    Current Outpatient Prescriptions on File Prior to Visit  Medication Sig Dispense Refill  . acetaminophen (TYLENOL) 325 MG tablet Take 650 mg by mouth every 6 (six) hours as needed for mild pain.    Marland Kitchen albuterol (PROVENTIL HFA;VENTOLIN HFA) 108 (90 BASE) MCG/ACT inhaler Inhale 2 puffs into the lungs every 6 (six) hours as needed. For wheezing.    . fexofenadine (ALLEGRA) 30 MG tablet Take 30 mg by mouth daily.    . furosemide (LASIX) 20 MG tablet Take 20 mg by mouth daily.  11  . levothyroxine (SYNTHROID, LEVOTHROID) 75 MCG tablet Take 75 mcg by mouth daily.    Marland Kitchen lisinopril (PRINIVIL,ZESTRIL) 20 MG tablet Take 20 mg by mouth 2 (two) times daily.  6  . MELATONIN PO Take 1 capsule by mouth at bedtime.    . simvastatin (ZOCOR) 20 MG tablet Take 20 mg by mouth daily.  11   No current facility-administered medications on file prior to visit.    No Known Allergies  No results found for this or any previous visit (from the past 2160 hour(s)).  Objective: General: Patient is awake, alert, and oriented x 3 and in no acute distress.  Integument: Skin is warm, dry and supple bilateral. Nails are tender, long, thickened and  dystrophic with subungual debris, consistent with onychomycosis, 1-5 bilateral. No signs of infection. No open lesions or preulcerative lesions present bilateral. Remaining integument unremarkable.  Vasculature:  Dorsalis Pedis pulse 1/4 bilateral. Posterior Tibial pulse  1/4 bilateral.  Capillary fill time <3 sec 1-5 bilateral. Scant hair growth to the level of the digits.Temperature gradient within normal limits. Mild varicosities present bilateral. Trace edema present bilateral.   Neurology: The patient has diminished sensation measured with a 5.07/10g Semmes Weinstein Monofilament at all pedal sites bilateral. Vibratory sensation decreased bilateral with tuning fork. No Babinski sign present bilateral.   Musculoskeletal: Asymptomatic hammertoe, bunion, pes planus pedal deformities noted bilateral. Muscular strength 5/5 in all lower extremity muscular groups bilateral without pain on range of motion. No tenderness with calf compression bilateral.  Assessment and Plan: Problem List Items Addressed This Visit    None    Visit Diagnoses    Dermatophytosis of nail    -  Primary   Diabetic polyneuropathy associated with type 2 diabetes mellitus (HCC)       Foot pain, bilateral          -Examined patient. -Discussed and educated patient on diabetic foot care, especially with regards to the vascular, neurological and musculoskeletal systems.  -Stressed the importance of good glycemic control and the detriment of not controlling glucose levels in relation to the foot. -Mechanically debrided all nails 1-5 bilateral using sterile nail nipper and  filed with dremel without incident  -Recommend good supportive shoes for foot type -Recommend topicals for neuropathy -Recommend to discuss with PCP diuretics for swelling  -Answered all patient questions -Patient to return  in 3 months for at risk foot care -Patient advised to call the office if any problems or questions arise in the  meantime.  Landis Martins, DPM

## 2016-11-15 DIAGNOSIS — R0609 Other forms of dyspnea: Secondary | ICD-10-CM | POA: Diagnosis not present

## 2016-11-15 DIAGNOSIS — Z0000 Encounter for general adult medical examination without abnormal findings: Secondary | ICD-10-CM | POA: Diagnosis not present

## 2016-11-15 DIAGNOSIS — E663 Overweight: Secondary | ICD-10-CM | POA: Diagnosis not present

## 2016-11-15 DIAGNOSIS — E038 Other specified hypothyroidism: Secondary | ICD-10-CM | POA: Diagnosis not present

## 2016-11-15 DIAGNOSIS — E784 Other hyperlipidemia: Secondary | ICD-10-CM | POA: Diagnosis not present

## 2016-11-15 DIAGNOSIS — E1122 Type 2 diabetes mellitus with diabetic chronic kidney disease: Secondary | ICD-10-CM | POA: Diagnosis not present

## 2016-11-15 DIAGNOSIS — N183 Chronic kidney disease, stage 3 (moderate): Secondary | ICD-10-CM | POA: Diagnosis not present

## 2016-11-15 DIAGNOSIS — R9431 Abnormal electrocardiogram [ECG] [EKG]: Secondary | ICD-10-CM | POA: Diagnosis not present

## 2016-11-15 DIAGNOSIS — I129 Hypertensive chronic kidney disease with stage 1 through stage 4 chronic kidney disease, or unspecified chronic kidney disease: Secondary | ICD-10-CM | POA: Diagnosis not present

## 2016-11-16 ENCOUNTER — Encounter: Payer: Self-pay | Admitting: Gastroenterology

## 2016-11-20 DIAGNOSIS — Z1212 Encounter for screening for malignant neoplasm of rectum: Secondary | ICD-10-CM | POA: Diagnosis not present

## 2016-11-29 DIAGNOSIS — R197 Diarrhea, unspecified: Secondary | ICD-10-CM | POA: Diagnosis not present

## 2016-11-29 DIAGNOSIS — L0889 Other specified local infections of the skin and subcutaneous tissue: Secondary | ICD-10-CM | POA: Diagnosis not present

## 2016-11-29 DIAGNOSIS — Z6830 Body mass index (BMI) 30.0-30.9, adult: Secondary | ICD-10-CM | POA: Diagnosis not present

## 2016-11-29 DIAGNOSIS — E1122 Type 2 diabetes mellitus with diabetic chronic kidney disease: Secondary | ICD-10-CM | POA: Diagnosis not present

## 2016-12-26 ENCOUNTER — Ambulatory Visit (AMBULATORY_SURGERY_CENTER): Payer: Self-pay | Admitting: *Deleted

## 2016-12-26 VITALS — Ht 60.0 in | Wt 154.6 lb

## 2016-12-26 DIAGNOSIS — Z1211 Encounter for screening for malignant neoplasm of colon: Secondary | ICD-10-CM

## 2016-12-26 MED ORDER — NA SULFATE-K SULFATE-MG SULF 17.5-3.13-1.6 GM/177ML PO SOLN: 1.0000 | Freq: Once | ORAL | 0 refills | Status: AC

## 2016-12-26 NOTE — Progress Notes (Signed)
No egg or soy allergy known to patient  No issues with past sedation with any surgeries  or procedures, no intubation problems  No diet pills per patient No home 02 use per patient  No blood thinners per patient  Pt denies issues with constipation  No A fib or A flutter   Pneumonia shot caused arm to be very swollen and red- no egg allergy

## 2016-12-27 ENCOUNTER — Encounter: Payer: Self-pay | Admitting: Gastroenterology

## 2017-01-09 ENCOUNTER — Ambulatory Visit (AMBULATORY_SURGERY_CENTER): Payer: Medicare HMO | Admitting: Gastroenterology

## 2017-01-09 ENCOUNTER — Encounter: Payer: Self-pay | Admitting: Gastroenterology

## 2017-01-09 VITALS — BP 110/63 | HR 67 | Temp 97.3°F | Resp 27 | Ht 60.0 in | Wt 154.0 lb

## 2017-01-09 DIAGNOSIS — D123 Benign neoplasm of transverse colon: Secondary | ICD-10-CM

## 2017-01-09 DIAGNOSIS — K635 Polyp of colon: Secondary | ICD-10-CM

## 2017-01-09 DIAGNOSIS — Z1211 Encounter for screening for malignant neoplasm of colon: Secondary | ICD-10-CM

## 2017-01-09 DIAGNOSIS — D125 Benign neoplasm of sigmoid colon: Secondary | ICD-10-CM

## 2017-01-09 DIAGNOSIS — Z1212 Encounter for screening for malignant neoplasm of rectum: Secondary | ICD-10-CM | POA: Diagnosis not present

## 2017-01-09 DIAGNOSIS — I10 Essential (primary) hypertension: Secondary | ICD-10-CM | POA: Diagnosis not present

## 2017-01-09 DIAGNOSIS — E119 Type 2 diabetes mellitus without complications: Secondary | ICD-10-CM | POA: Diagnosis not present

## 2017-01-09 DIAGNOSIS — E039 Hypothyroidism, unspecified: Secondary | ICD-10-CM | POA: Diagnosis not present

## 2017-01-09 MED ORDER — FLEET ENEMA 7-19 GM/118ML RE ENEM
1.0000 | ENEMA | Freq: Once | RECTAL | Status: AC
  Administered 2017-01-09: 1 via RECTAL

## 2017-01-09 MED ORDER — SODIUM CHLORIDE 0.9 % IV SOLN: 500.0000 mL | INTRAVENOUS | Status: DC

## 2017-01-09 NOTE — Progress Notes (Signed)
Pt reports that she ate yesterday.  She completed her prep but reports that her stools are brown with solid pieces.  Spoke with Dr. Silverio Decamp and we will give pt 1 enema to see what the results are.   Results are cloudy, yellow liquid but there are no solid pieces.  Reported results to MD.  We will plan to proceed with procedure.  Patient is aware that if she is not properly prepped, she may need to repeat colonoscopy.

## 2017-01-09 NOTE — Op Note (Signed)
[facility] Patient Name: Amber Gray Procedure Date: 01/09/2017 9:08 AM MRN: [id number] Endoscopist: Mauri Pole , MD Age: 76 Referring MD:  Date of Birth: [birth year] Gender: Female Account #: [id number] Procedure:                Colonoscopy Indications:              Screening for malignant neoplasm in the rectum,                            Last colonoscopy: 2007 Medicines:                Monitored Anesthesia Care Procedure:                Pre-Anesthesia Assessment:                           - Prior to the procedure, a History and Physical                            was performed, and patient medications and                            allergies were reviewed. The patient's tolerance of                            previous anesthesia was also reviewed. The risks                            and benefits of the procedure and the sedation                            options and risks were discussed with the patient.                            All questions were answered, and informed consent                            was obtained. Prior Anticoagulants: The patient has                            taken no previous anticoagulant or antiplatelet                            agents. ASA Grade Assessment: III - A patient with                            severe systemic disease. After reviewing the risks                            and benefits, the patient was deemed in                            satisfactory condition to undergo the procedure.  After obtaining informed consent, the colonoscope                            was passed under direct vision. Throughout the                            procedure, the patient's blood pressure, pulse, and                            oxygen saturations were monitored continuously. The                            Colonoscope was introduced through the anus and                            advanced to the the cecum,  identified by                            appendiceal orifice and ileocecal valve. The                            colonoscopy was performed without difficulty. The                            patient tolerated the procedure well. The quality                            of the bowel preparation was adequate. The                            ileocecal valve, appendiceal orifice, and rectum                            were photographed. Scope In: 9:15:47 AM Scope Out: 9:31:55 AM Scope Withdrawal Time: 0 hours 7 minutes 51 seconds  Total Procedure Duration: 0 hours 16 minutes 8 seconds  Findings:                 The perianal and digital rectal examinations were                            normal.                           Two sessile polyps were found in the sigmoid colon                            and hepatic flexure. The polyps were 4 to 6 mm in                            size. These polyps were removed with a cold snare.                            Resection and retrieval were complete.  The exam was otherwise without abnormality. Complications:            No immediate complications. Estimated Blood Loss:     Estimated blood loss was minimal. Impression:               - Two 4 to 6 mm polyps in the sigmoid colon and at                            the hepatic flexure, removed with a cold snare.                            Resected and retrieved.                           - The examination was otherwise normal. Recommendation:           - Patient has a contact number available for                            emergencies. The signs and symptoms of potential                            delayed complications were discussed with the                            patient. Return to normal activities tomorrow.                            Written discharge instructions were provided to the                            patient.                           - Resume previous diet.                            - Continue present medications.                           - Await pathology results.                           - Repeat colonoscopy in 5-10 years for surveillance                            based on pathology results. Mauri Pole, MD 01/09/2017 9:40:54 AM This report has been signed electronically.

## 2017-01-09 NOTE — Patient Instructions (Signed)
Discharge instructions given. Handout on polyps. Resume previous medications. YOU HAD AN ENDOSCOPIC PROCEDURE TODAY AT THE [facility]:   Refer to the procedure report that was given to you for any specific questions about what was found during the examination.  If the procedure report does not answer your questions, please call your gastroenterologist to clarify.  If you requested that your care partner not be given the details of your procedure findings, then the procedure report has been included in a sealed envelope for you to review at your convenience later.  YOU SHOULD EXPECT: Some feelings of bloating in the abdomen. Passage of more gas than usual.  Walking can help get rid of the air that was put into your GI tract during the procedure and reduce the bloating. If you had a lower endoscopy (such as a colonoscopy or flexible sigmoidoscopy) you may notice spotting of blood in your stool or on the toilet paper. If you underwent a bowel prep for your procedure, you may not have a normal bowel movement for a few days.  Please Note:  You might notice some irritation and congestion in your nose or some drainage.  This is from the oxygen used during your procedure.  There is no need for concern and it should clear up in a day or so.  SYMPTOMS TO REPORT IMMEDIATELY:   Following lower endoscopy (colonoscopy or flexible sigmoidoscopy):  Excessive amounts of blood in the stool  Significant tenderness or worsening of abdominal pains  Swelling of the abdomen that is new, acute  Fever of 100F or higher   For urgent or emergent issues, a gastroenterologist can be reached at any hour by calling [phone].   DIET:  We do recommend a small meal at first, but then you may proceed to your regular diet.  Drink plenty of fluids but you should avoid alcoholic beverages for 24 hours.  ACTIVITY:  You should plan to take it easy for the rest of today and you should NOT DRIVE or use heavy  machinery until tomorrow (because of the sedation medicines used during the test).    FOLLOW UP: Our staff will call the number listed on your records the next business day following your procedure to check on you and address any questions or concerns that you may have regarding the information given to you following your procedure. If we do not reach you, we will leave a message.  However, if you are feeling well and you are not experiencing any problems, there is no need to return our call.  We will assume that you have returned to your regular daily activities without incident.  If any biopsies were taken you will be contacted by phone or by letter within the next 1-3 weeks.  Please call us at [phone] if you have not heard about the biopsies in 3 weeks.    SIGNATURES/CONFIDENTIALITY: You and/or your care partner have signed paperwork which will be entered into your electronic medical record.  These signatures attest to the fact that that the information above on your After Visit Summary has been reviewed and is understood.  Full responsibility of the confidentiality of this discharge information lies with you and/or your care-partner. 

## 2017-01-09 NOTE — Progress Notes (Signed)
Called to room to assist during endoscopic procedure.  Patient ID and intended procedure confirmed with present staff. Received instructions for my participation in the procedure from the performing physician.  

## 2017-01-09 NOTE — Progress Notes (Signed)
To recovery, report to McCoy, RN, VSS 

## 2017-01-10 ENCOUNTER — Telehealth: Payer: Self-pay

## 2017-01-10 DIAGNOSIS — L0889 Other specified local infections of the skin and subcutaneous tissue: Secondary | ICD-10-CM | POA: Diagnosis not present

## 2017-01-10 DIAGNOSIS — E1140 Type 2 diabetes mellitus with diabetic neuropathy, unspecified: Secondary | ICD-10-CM | POA: Diagnosis not present

## 2017-01-10 DIAGNOSIS — I129 Hypertensive chronic kidney disease with stage 1 through stage 4 chronic kidney disease, or unspecified chronic kidney disease: Secondary | ICD-10-CM | POA: Diagnosis not present

## 2017-01-10 DIAGNOSIS — E1122 Type 2 diabetes mellitus with diabetic chronic kidney disease: Secondary | ICD-10-CM | POA: Diagnosis not present

## 2017-01-10 DIAGNOSIS — Z6830 Body mass index (BMI) 30.0-30.9, adult: Secondary | ICD-10-CM | POA: Diagnosis not present

## 2017-01-10 NOTE — Telephone Encounter (Signed)
  Follow up Call-  Call back number 01/09/2017  Post procedure Call Back phone  # [phone]  Permission to leave phone message Yes  Some recent data might be hidden     Patient questions:  Do you have a fever, pain , or abdominal swelling? No. Pain Score  0 *  Have you tolerated food without any problems? Yes.    Have you been able to return to your normal activities? Yes.    Do you have any questions about your discharge instructions: Diet   No. Medications  No. Follow up visit  No.  Do you have questions or concerns about your Care? No.  Actions: * If pain score is 4 or above: No action needed, pain <4.

## 2017-01-21 ENCOUNTER — Encounter: Payer: Self-pay | Admitting: Gastroenterology

## 2017-01-31 DIAGNOSIS — H5200 Hypermetropia, unspecified eye: Secondary | ICD-10-CM | POA: Diagnosis not present

## 2017-01-31 DIAGNOSIS — I10 Essential (primary) hypertension: Secondary | ICD-10-CM | POA: Diagnosis not present

## 2017-02-07 DIAGNOSIS — I129 Hypertensive chronic kidney disease with stage 1 through stage 4 chronic kidney disease, or unspecified chronic kidney disease: Secondary | ICD-10-CM | POA: Diagnosis not present

## 2017-02-07 DIAGNOSIS — E1140 Type 2 diabetes mellitus with diabetic neuropathy, unspecified: Secondary | ICD-10-CM | POA: Diagnosis not present

## 2017-02-07 DIAGNOSIS — M25561 Pain in right knee: Secondary | ICD-10-CM | POA: Diagnosis not present

## 2017-02-07 DIAGNOSIS — E1122 Type 2 diabetes mellitus with diabetic chronic kidney disease: Secondary | ICD-10-CM | POA: Diagnosis not present

## 2017-02-12 ENCOUNTER — Encounter: Payer: Self-pay | Admitting: Sports Medicine

## 2017-02-12 ENCOUNTER — Ambulatory Visit (INDEPENDENT_AMBULATORY_CARE_PROVIDER_SITE_OTHER): Payer: Medicare HMO | Admitting: Sports Medicine

## 2017-02-12 DIAGNOSIS — B351 Tinea unguium: Secondary | ICD-10-CM

## 2017-02-12 DIAGNOSIS — M79671 Pain in right foot: Secondary | ICD-10-CM

## 2017-02-12 DIAGNOSIS — E1142 Type 2 diabetes mellitus with diabetic polyneuropathy: Secondary | ICD-10-CM

## 2017-02-12 DIAGNOSIS — M79672 Pain in left foot: Secondary | ICD-10-CM

## 2017-02-12 NOTE — Progress Notes (Signed)
Subjective: Amber Gray is a 76 y.o. female patient with history of diabetes who returns to office today complaining of long, painful nails  while ambulating in shoes; unable to trim. Patient states that the glucose reading this morning was 168. Patient denies any new changes in medication or new problems. Patient denies any new cramping, numbness, burning or tingling in the legs.  Patient Active Problem List   Diagnosis Date Noted  . Type 2 diabetes mellitus ([place]) 06/27/2015  . Hypertension 06/27/2015  . Hypothyroidism 06/27/2015  . Chest pain 06/27/2015  . Precordial chest pain    Current Outpatient Prescriptions on File Prior to Visit  Medication Sig Dispense Refill  . acetaminophen (TYLENOL) 325 MG tablet Take 650 mg by mouth every 6 (six) hours as needed for mild pain.    Marland Kitchen albuterol (PROVENTIL HFA;VENTOLIN HFA) 108 (90 BASE) MCG/ACT inhaler Inhale 2 puffs into the lungs every 6 (six) hours as needed. For wheezing.    . cetirizine (ZYRTEC) 10 MG tablet Take 10 mg by mouth daily.    . empagliflozin (JARDIANCE) 10 MG TABS tablet Take 10 mg by mouth daily.    . furosemide (LASIX) 20 MG tablet Take 20 mg by mouth daily.  11  . levothyroxine (SYNTHROID, LEVOTHROID) 75 MCG tablet Take 75 mcg by mouth daily.    Marland Kitchen lisinopril (PRINIVIL,ZESTRIL) 20 MG tablet Take 20 mg by mouth 2 (two) times daily.  6  . MELATONIN PO Take 1 capsule by mouth at bedtime.    Marland Kitchen omeprazole (PRILOSEC) 20 MG capsule     . simvastatin (ZOCOR) 20 MG tablet Take 20 mg by mouth daily.  11  . TRADJENTA 5 MG TABS tablet      Current Facility-Administered Medications on File Prior to Visit  Medication Dose Route Frequency Provider Last Rate Last Dose  . 0.9 %  sodium chloride infusion  500 mL Intravenous Continuous Nandigam, Venia Minks, MD       Allergies  Allergen Reactions  . Metformin And Related Other (See Comments)    No results found for this or any previous visit (from the past 2160  hour(s)).  Objective: General: Patient is awake, alert, and oriented x 3 and in no acute distress.  Integument: Skin is warm, dry and supple bilateral. Nails are tender, long, thickened and dystrophic with subungual debris, consistent with onychomycosis, 1-5 bilateral. No signs of infection. No open lesions or preulcerative lesions present bilateral. Remaining integument unremarkable.  Vasculature:  Dorsalis Pedis pulse 1/4 bilateral. Posterior Tibial pulse  1/4 bilateral.  Capillary fill time <3 sec 1-5 bilateral. Scant hair growth to the level of the digits.Temperature gradient within normal limits. Mild varicosities present bilateral. Trace edema present bilateral.   Neurology: The patient has diminished sensation measured with a 5.07/10g Semmes Weinstein Monofilament at all pedal sites bilateral. Vibratory sensation decreased bilateral with tuning fork. No Babinski sign present bilateral.   Musculoskeletal: Asymptomatic hammertoe, bunion, pes planus pedal deformities noted bilateral. Muscular strength 5/5 in all lower extremity muscular groups bilateral without pain on range of motion. No tenderness with calf compression bilateral.  Assessment and Plan: Problem List Items Addressed This Visit    None    Visit Diagnoses    Dermatophytosis of nail    -  Primary   Diabetic polyneuropathy associated with type 2 diabetes mellitus (HCC)       Foot pain, bilateral          -Examined patient. -Discussed and educated patient on diabetic foot  care, especially with regards to the vascular, neurological and musculoskeletal systems.  -Stressed the importance of good glycemic control and the detriment of not controlling glucose levels in relation to the foot. -Mechanically debrided all nails 1-5 bilateral using sterile nail nipper and filed with dremel without incident  -Recommend good supportive shoes for foot type -Recommend topicals for neuropathy -Recommend to cont with Rx from PCP of  diuretics for swelling  -Answered all patient questions -Patient to return  in 3 months for at risk foot care -Patient advised to call the office if any problems or questions arise in the meantime.  Landis Martins, DPM

## 2017-04-09 ENCOUNTER — Ambulatory Visit (INDEPENDENT_AMBULATORY_CARE_PROVIDER_SITE_OTHER): Payer: Medicare HMO

## 2017-04-09 ENCOUNTER — Ambulatory Visit (INDEPENDENT_AMBULATORY_CARE_PROVIDER_SITE_OTHER): Payer: Medicare HMO | Admitting: Orthopaedic Surgery

## 2017-04-09 ENCOUNTER — Encounter (INDEPENDENT_AMBULATORY_CARE_PROVIDER_SITE_OTHER): Payer: Self-pay | Admitting: Orthopaedic Surgery

## 2017-04-09 VITALS — BP 125/63 | HR 61 | Temp 97.3°F | Ht 60.0 in | Wt 140.0 lb

## 2017-04-09 DIAGNOSIS — M25561 Pain in right knee: Secondary | ICD-10-CM

## 2017-04-09 DIAGNOSIS — G8929 Other chronic pain: Secondary | ICD-10-CM | POA: Diagnosis not present

## 2017-04-09 DIAGNOSIS — M94261 Chondromalacia, right knee: Secondary | ICD-10-CM | POA: Diagnosis not present

## 2017-04-09 NOTE — Progress Notes (Signed)
Office Visit Note   Patient: Amber Gray.           Date of Birth: 12/15/1940           MRN: [id number] Visit Date: 04/09/2017              Requested by: [name], [place], [place] [address] PCP: [name], MD   Assessment & Plan: Visit Diagnoses:  1. Chronic pain of right knee     Plan: Patient is a knee sleeve she's been using Tylenol she can intermittently try some ibuprofen as long as it doesn't bother her stomach. She needs to see her primary care physician about her diabetes was run sugars running in the 200s to 300s. She can return if she develops locking or increased symptoms. With her current CBGs would not recommend intra-articular injection for her right knee. If she has increased symptoms she can return.  Follow-Up Instructions: Return if symptoms worsen or fail to improve.   Orders:  Orders Placed This Encounter  Procedures  . XR KNEE 3 VIEW RIGHT   No orders of the defined types were placed in this encounter.     Procedures: No procedures performed   Clinical Data: No additional findings.   Subjective: Chief Complaint  Patient presents with  . Right Knee - Pain    HPI 76 year old female have not seen in about 10 years 0 with a new problem which is right knee pain. She denies any specific injury. She notes swelling in her knee by the end of the day. Chest some problems with stairs. She has some popping occasionally and it feels like her knee will give way but she is not actually fallen. She denies fever chills no rheumatologic conditions. She does have diabetes and her sugars run in the 200s occasionally low 300s she is only on metformin.  Review of Systems  Constitutional: Negative for chills and diaphoresis.  HENT: Negative for ear discharge, ear pain and nosebleeds.   Eyes: Negative for discharge and visual disturbance.  Respiratory: Negative for cough, choking and shortness of breath.   Cardiovascular: Negative for  chest pain and palpitations.  Gastrointestinal: Negative for abdominal distention and abdominal pain.       Recent colonoscopy with the 2 polyps biopsied.  Endocrine: Negative for cold intolerance and heat intolerance.       Positive for diabetes on metformin, type 2 diabetes  Genitourinary: Negative for flank pain and hematuria.  Musculoskeletal:       Post for right knee pain with the swelling.  Skin: Negative for rash and wound.  Neurological: Negative for seizures and speech difficulty.  Hematological: Negative for adenopathy. Does not bruise/bleed easily.  Psychiatric/Behavioral: Negative for agitation and suicidal ideas.     Objective: Vital Signs: BP 125/63 (BP Location: Right Arm, Patient Position: Sitting, Cuff Size: Normal)   Pulse 61   Temp (!) 97.3 F (36.3 C) (Oral)   Ht 5' (1.524 m)   Wt 140 lb (63.5 kg)   BMI 27.34 kg/m   Physical Exam  Constitutional: She is oriented to person, place, and time. She appears well-developed.  HENT:  Head: Normocephalic.  Right Ear: External ear normal.  Left Ear: External ear normal.  Eyes: Pupils are equal, round, and reactive to light.  Neck: No tracheal deviation present. No thyromegaly present.  Cardiovascular: Normal rate.   Pulmonary/Chest: Effort normal.  Abdominal: Soft.  Neurological: She is alert and oriented to person, place, and time.  Skin: Skin is warm and dry.  Psychiatric: She has a normal mood and affect. Her behavior is normal.    Ortho Exam patient has normal hip range of motion. Spinous drape pelvis is level. Well-healed anterior incision from cervical fusion. Large semi-reflexes are 2+. No venous stasis changes distal pulses are palpable. No pain with hip range of motion minimal trochanteric bursal tenderness. She has medial joint line tenderness palpable medial osteophytes on the distal femur which are tender bilaterally. Negative Lachman negative shift collateral ligaments are stable no palpable Baker's  cyst. Mild crepitus with knee extension normal patellar tracking. Pes bursa is the normal no tenderness of the patellar tendon.  Specialty Comments:  No specialty comments available.  Imaging: Xr Knee 3 View Right  Result Date: 04/09/2017 AP lateral sunrise patellar x-rays obtained including standing AP both knees. This shows mild medial joint line narrowing minimal patellofemoral spurring. Negative for acute changes. 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 Negative for acute fracture. Bony architecture is normal.    PMFS History: Patient Active Problem List   Diagnosis Date Noted  . Type 2 diabetes mellitus ([place]) 06/27/2015  . Hypertension 06/27/2015  . Hypothyroidism 06/27/2015  . Chest pain 06/27/2015  . Precordial chest pain    Past Medical History:  Diagnosis Date  . Allergy   . Arthritis    hands and feet   . Cataract    removed bilateral  . Chronic kidney disease    stageIII  . Diabetes mellitus without complication ([place])   . GERD (gastroesophageal reflux disease)   . Hypercholesteremia   . Hypertension   . Hypothyroid     Family History  Problem Relation Age of Onset  . Alzheimer's disease Mother   . Heart attack Father   . Heart disease Father   . Dwarfism Sister   . Colon cancer Neg Hx   . Colon polyps Neg Hx   . Esophageal cancer Neg Hx   . Rectal cancer Neg Hx   . Stomach cancer Neg Hx     Past Surgical History:  Procedure Laterality Date  . CERVICAL LAMINECTOMY    . COLONOSCOPY  10/25/2005   normal   Social History   Occupational History  . Not on file.   Social History Main Topics  . Smoking status: Former Smoker    Types: Cigarettes    Quit date: 10/01/1964  . Smokeless tobacco: Never Used  . Alcohol use No  . Drug use: No  . Sexual activity: No

## 2017-04-23 DIAGNOSIS — M25561 Pain in right knee: Secondary | ICD-10-CM | POA: Diagnosis not present

## 2017-04-23 DIAGNOSIS — E1122 Type 2 diabetes mellitus with diabetic chronic kidney disease: Secondary | ICD-10-CM | POA: Diagnosis not present

## 2017-04-23 DIAGNOSIS — Z6828 Body mass index (BMI) 28.0-28.9, adult: Secondary | ICD-10-CM | POA: Diagnosis not present

## 2017-05-15 DIAGNOSIS — H903 Sensorineural hearing loss, bilateral: Secondary | ICD-10-CM | POA: Diagnosis not present

## 2017-05-15 DIAGNOSIS — H6123 Impacted cerumen, bilateral: Secondary | ICD-10-CM | POA: Diagnosis not present

## 2017-05-15 DIAGNOSIS — Z87891 Personal history of nicotine dependence: Secondary | ICD-10-CM | POA: Diagnosis not present

## 2017-05-21 ENCOUNTER — Ambulatory Visit: Payer: Medicare HMO | Admitting: Sports Medicine

## 2017-05-27 ENCOUNTER — Encounter (HOSPITAL_COMMUNITY): Payer: Self-pay | Admitting: Emergency Medicine

## 2017-05-27 ENCOUNTER — Inpatient Hospital Stay (HOSPITAL_COMMUNITY)
Admission: EM | Admit: 2017-05-27 | Discharge: 2017-05-31 | DRG: 871 | Disposition: A | Discharge status: Home Health | Payer: Medicare HMO | Attending: Family Medicine | Admitting: Family Medicine

## 2017-05-27 ENCOUNTER — Emergency Department (HOSPITAL_COMMUNITY): Payer: Medicare HMO

## 2017-05-27 DIAGNOSIS — N183 Chronic kidney disease, stage 3 unspecified: Secondary | ICD-10-CM | POA: Diagnosis present

## 2017-05-27 DIAGNOSIS — E7800 Pure hypercholesterolemia, unspecified: Secondary | ICD-10-CM | POA: Diagnosis present

## 2017-05-27 DIAGNOSIS — E118 Type 2 diabetes mellitus with unspecified complications: Secondary | ICD-10-CM | POA: Diagnosis not present

## 2017-05-27 DIAGNOSIS — M19071 Primary osteoarthritis, right ankle and foot: Secondary | ICD-10-CM | POA: Diagnosis present

## 2017-05-27 DIAGNOSIS — Z66 Do not resuscitate: Secondary | ICD-10-CM | POA: Diagnosis present

## 2017-05-27 DIAGNOSIS — J449 Chronic obstructive pulmonary disease, unspecified: Secondary | ICD-10-CM | POA: Diagnosis present

## 2017-05-27 DIAGNOSIS — I517 Cardiomegaly: Secondary | ICD-10-CM | POA: Diagnosis not present

## 2017-05-27 DIAGNOSIS — E039 Hypothyroidism, unspecified: Secondary | ICD-10-CM | POA: Diagnosis present

## 2017-05-27 DIAGNOSIS — Z7984 Long term (current) use of oral hypoglycemic drugs: Secondary | ICD-10-CM

## 2017-05-27 DIAGNOSIS — E876 Hypokalemia: Secondary | ICD-10-CM | POA: Diagnosis not present

## 2017-05-27 DIAGNOSIS — M19041 Primary osteoarthritis, right hand: Secondary | ICD-10-CM | POA: Diagnosis present

## 2017-05-27 DIAGNOSIS — E869 Volume depletion, unspecified: Secondary | ICD-10-CM | POA: Diagnosis present

## 2017-05-27 DIAGNOSIS — B9789 Other viral agents as the cause of diseases classified elsewhere: Secondary | ICD-10-CM | POA: Diagnosis present

## 2017-05-27 DIAGNOSIS — N179 Acute kidney failure, unspecified: Secondary | ICD-10-CM | POA: Diagnosis not present

## 2017-05-27 DIAGNOSIS — Z79899 Other long term (current) drug therapy: Secondary | ICD-10-CM

## 2017-05-27 DIAGNOSIS — J9601 Acute respiratory failure with hypoxia: Secondary | ICD-10-CM | POA: Diagnosis present

## 2017-05-27 DIAGNOSIS — A419 Sepsis, unspecified organism: Secondary | ICD-10-CM | POA: Diagnosis not present

## 2017-05-27 DIAGNOSIS — J189 Pneumonia, unspecified organism: Secondary | ICD-10-CM | POA: Diagnosis not present

## 2017-05-27 DIAGNOSIS — K219 Gastro-esophageal reflux disease without esophagitis: Secondary | ICD-10-CM | POA: Diagnosis present

## 2017-05-27 DIAGNOSIS — I509 Heart failure, unspecified: Secondary | ICD-10-CM | POA: Diagnosis not present

## 2017-05-27 DIAGNOSIS — E1165 Type 2 diabetes mellitus with hyperglycemia: Secondary | ICD-10-CM | POA: Diagnosis present

## 2017-05-27 DIAGNOSIS — E1122 Type 2 diabetes mellitus with diabetic chronic kidney disease: Secondary | ICD-10-CM | POA: Diagnosis present

## 2017-05-27 DIAGNOSIS — E1121 Type 2 diabetes mellitus with diabetic nephropathy: Secondary | ICD-10-CM | POA: Diagnosis not present

## 2017-05-27 DIAGNOSIS — I130 Hypertensive heart and chronic kidney disease with heart failure and stage 1 through stage 4 chronic kidney disease, or unspecified chronic kidney disease: Secondary | ICD-10-CM | POA: Diagnosis not present

## 2017-05-27 DIAGNOSIS — E785 Hyperlipidemia, unspecified: Secondary | ICD-10-CM | POA: Diagnosis not present

## 2017-05-27 DIAGNOSIS — M19072 Primary osteoarthritis, left ankle and foot: Secondary | ICD-10-CM | POA: Diagnosis present

## 2017-05-27 DIAGNOSIS — M19042 Primary osteoarthritis, left hand: Secondary | ICD-10-CM | POA: Diagnosis present

## 2017-05-27 DIAGNOSIS — IMO0002 Reserved for concepts with insufficient information to code with codable children: Secondary | ICD-10-CM | POA: Diagnosis present

## 2017-05-27 DIAGNOSIS — J181 Lobar pneumonia, unspecified organism: Secondary | ICD-10-CM | POA: Diagnosis not present

## 2017-05-27 DIAGNOSIS — I10 Essential (primary) hypertension: Secondary | ICD-10-CM | POA: Diagnosis present

## 2017-05-27 DIAGNOSIS — E784 Other hyperlipidemia: Secondary | ICD-10-CM | POA: Diagnosis not present

## 2017-05-27 DIAGNOSIS — Z87891 Personal history of nicotine dependence: Secondary | ICD-10-CM

## 2017-05-27 DIAGNOSIS — R05 Cough: Secondary | ICD-10-CM | POA: Diagnosis not present

## 2017-05-27 DIAGNOSIS — R0602 Shortness of breath: Secondary | ICD-10-CM | POA: Diagnosis not present

## 2017-05-27 DIAGNOSIS — R069 Unspecified abnormalities of breathing: Secondary | ICD-10-CM | POA: Diagnosis not present

## 2017-05-27 DIAGNOSIS — Z881 Allergy status to other antibiotic agents status: Secondary | ICD-10-CM

## 2017-05-27 HISTORY — DX: Low back pain, unspecified: M54.50

## 2017-05-27 HISTORY — DX: Personal history of other diseases of the digestive system: Z87.19

## 2017-05-27 HISTORY — DX: Other seasonal allergic rhinitis: J30.2

## 2017-05-27 HISTORY — DX: Headache: R51

## 2017-05-27 HISTORY — DX: Pneumonia, unspecified organism: J18.9

## 2017-05-27 HISTORY — DX: Chronic kidney disease, stage 3 (moderate): N18.3

## 2017-05-27 HISTORY — DX: Type 2 diabetes mellitus without complications: E11.9

## 2017-05-27 HISTORY — DX: Other chronic pain: G89.29

## 2017-05-27 HISTORY — DX: Personal history of other medical treatment: Z92.89

## 2017-05-27 HISTORY — DX: Low back pain: M54.5

## 2017-05-27 HISTORY — DX: Urinary tract infection, site not specified: N39.0

## 2017-05-27 LAB — URINALYSIS, ROUTINE W REFLEX MICROSCOPIC
Bacteria, UA: NONE SEEN
Bilirubin Urine: NEGATIVE
Hgb urine dipstick: NEGATIVE
Ketones, ur: NEGATIVE mg/dL
Leukocytes, UA: NEGATIVE
Nitrite: NEGATIVE
PROTEIN: NEGATIVE mg/dL
RBC / HPF: NONE SEEN RBC/hpf (ref 0–5)
Specific Gravity, Urine: 1.011 (ref 1.005–1.030)
pH: 6 (ref 5.0–8.0)

## 2017-05-27 LAB — RESPIRATORY PANEL BY PCR
Adenovirus: NOT DETECTED
BORDETELLA PERTUSSIS-RVPCR: NOT DETECTED
CHLAMYDOPHILA PNEUMONIAE-RVPPCR: NOT DETECTED
CORONAVIRUS NL63-RVPPCR: NOT DETECTED
Coronavirus 229E: NOT DETECTED
Coronavirus HKU1: NOT DETECTED
Coronavirus OC43: NOT DETECTED
INFLUENZA A-RVPPCR: NOT DETECTED
INFLUENZA B-RVPPCR: NOT DETECTED
Metapneumovirus: NOT DETECTED
Mycoplasma pneumoniae: NOT DETECTED
PARAINFLUENZA VIRUS 3-RVPPCR: NOT DETECTED
PARAINFLUENZA VIRUS 4-RVPPCR: NOT DETECTED
Parainfluenza Virus 1: NOT DETECTED
Parainfluenza Virus 2: NOT DETECTED
RESPIRATORY SYNCYTIAL VIRUS-RVPPCR: NOT DETECTED
RHINOVIRUS / ENTEROVIRUS - RVPPCR: DETECTED — AB

## 2017-05-27 LAB — COMPREHENSIVE METABOLIC PANEL
ALBUMIN: 3.2 g/dL — AB (ref 3.5–5.0)
ALT: 12 U/L — AB (ref 14–54)
ANION GAP: 12 (ref 5–15)
AST: 24 U/L (ref 15–41)
Alkaline Phosphatase: 49 U/L (ref 38–126)
BUN: 15 mg/dL (ref 6–20)
CHLORIDE: 109 mmol/L (ref 101–111)
CO2: 17 mmol/L — AB (ref 22–32)
Calcium: 7.9 mg/dL — ABNORMAL LOW (ref 8.9–10.3)
Creatinine, Ser: 1.18 mg/dL — ABNORMAL HIGH (ref 0.44–1.00)
GFR, EST AFRICAN AMERICAN: 51 mL/min — AB (ref >60)
GFR, EST NON AFRICAN AMERICAN: 44 mL/min — AB (ref >60)
Glucose, Bld: 230 mg/dL — ABNORMAL HIGH (ref 65–99)
POTASSIUM: 3.2 mmol/L — AB (ref 3.5–5.1)
SODIUM: 138 mmol/L (ref 135–145)
Total Bilirubin: 0.5 mg/dL (ref 0.3–1.2)
Total Protein: 6.4 g/dL — ABNORMAL LOW (ref 6.5–8.1)

## 2017-05-27 LAB — I-STAT CG4 LACTIC ACID, ED
LACTIC ACID, VENOUS: 5.38 mmol/L — AB (ref 0.5–1.9)
Lactic Acid, Venous: 2.29 mmol/L (ref 0.5–1.9)

## 2017-05-27 LAB — LACTIC ACID, PLASMA
LACTIC ACID, VENOUS: 3.6 mmol/L — AB (ref 0.5–1.9)
LACTIC ACID, VENOUS: 4.1 mmol/L — AB (ref 0.5–1.9)
LACTIC ACID, VENOUS: 3.7 mmol/L — AB (ref 0.5–1.9)
LACTIC ACID, VENOUS: 3.6 mmol/L — AB (ref 0.5–1.9)

## 2017-05-27 LAB — CBC WITH DIFFERENTIAL/PLATELET
Basophils Absolute: 0 10*3/uL (ref 0.0–0.1)
Basophils Relative: 0 %
EOS PCT: 1 %
Eosinophils Absolute: 0.1 10*3/uL (ref 0.0–0.7)
HCT: 39.1 % (ref 36.0–46.0)
Hemoglobin: 13.3 g/dL (ref 12.0–15.0)
LYMPHS ABS: 2.9 10*3/uL (ref 0.7–4.0)
LYMPHS PCT: 23 %
MCH: 30.4 pg (ref 26.0–34.0)
MCHC: 34 g/dL (ref 30.0–36.0)
MCV: 89.3 fL (ref 78.0–100.0)
MONO ABS: 0.8 10*3/uL (ref 0.1–1.0)
Monocytes Relative: 7 %
Neutro Abs: 8.7 10*3/uL — ABNORMAL HIGH (ref 1.7–7.7)
Neutrophils Relative %: 69 %
PLATELETS: 271 10*3/uL (ref 150–400)
RBC: 4.38 MIL/uL (ref 3.87–5.11)
RDW: 14.2 % (ref 11.5–15.5)
WBC: 12.6 10*3/uL — ABNORMAL HIGH (ref 4.0–10.5)

## 2017-05-27 LAB — CBG MONITORING, ED: GLUCOSE-CAPILLARY: 235 mg/dL — AB (ref 65–99)

## 2017-05-27 LAB — STREP PNEUMONIAE URINARY ANTIGEN: Strep Pneumo Urinary Antigen: NEGATIVE

## 2017-05-27 LAB — EXPECTORATED SPUTUM ASSESSMENT W GRAM STAIN, RFLX TO RESP C

## 2017-05-27 LAB — EXPECTORATED SPUTUM ASSESSMENT W REFEX TO RESP CULTURE

## 2017-05-27 LAB — MRSA PCR SCREENING: MRSA BY PCR: NEGATIVE

## 2017-05-27 LAB — MAGNESIUM: MAGNESIUM: 2.1 mg/dL (ref 1.7–2.4)

## 2017-05-27 LAB — GLUCOSE, CAPILLARY
GLUCOSE-CAPILLARY: 200 mg/dL — AB (ref 65–99)
Glucose-Capillary: 239 mg/dL — ABNORMAL HIGH (ref 65–99)

## 2017-05-27 LAB — PROCALCITONIN

## 2017-05-27 MED ORDER — PIPERACILLIN-TAZOBACTAM 3.375 G IVPB 30 MIN
3.3750 g | Freq: Once | INTRAVENOUS | Status: AC
  Administered 2017-05-27: 3.375 g via INTRAVENOUS
  Filled 2017-05-27: qty 50

## 2017-05-27 MED ORDER — SODIUM CHLORIDE 0.9 % IV SOLN
1000.0000 mL | INTRAVENOUS | Status: DC
  Administered 2017-05-27: 1000 mL via INTRAVENOUS

## 2017-05-27 MED ORDER — IPRATROPIUM-ALBUTEROL 0.5-2.5 (3) MG/3ML IN SOLN
3.0000 mL | Freq: Once | RESPIRATORY_TRACT | Status: AC
  Administered 2017-05-27: 3 mL via RESPIRATORY_TRACT
  Filled 2017-05-27: qty 3

## 2017-05-27 MED ORDER — ACETAMINOPHEN 325 MG PO TABS
650.0000 mg | ORAL_TABLET | Freq: Four times a day (QID) | ORAL | Status: DC | PRN
  Administered 2017-05-28 – 2017-05-31 (×2): 650 mg via ORAL
  Filled 2017-05-27 (×2): qty 2

## 2017-05-27 MED ORDER — METHYLPREDNISOLONE SODIUM SUCC 125 MG IJ SOLR
60.0000 mg | Freq: Two times a day (BID) | INTRAMUSCULAR | Status: DC
  Administered 2017-05-27 – 2017-05-30 (×8): 60 mg via INTRAVENOUS
  Filled 2017-05-27 (×8): qty 2

## 2017-05-27 MED ORDER — ACETAMINOPHEN 650 MG RE SUPP: 650.0000 mg | Freq: Four times a day (QID) | RECTAL | Status: DC | PRN

## 2017-05-27 MED ORDER — BUDESONIDE 0.25 MG/2ML IN SUSP
0.2500 mg | Freq: Two times a day (BID) | RESPIRATORY_TRACT | Status: DC
  Administered 2017-05-27 – 2017-05-30 (×7): 0.25 mg via RESPIRATORY_TRACT
  Filled 2017-05-27 (×9): qty 2

## 2017-05-27 MED ORDER — PHENOL 1.4 % MT LIQD
1.0000 | OROMUCOSAL | Status: DC | PRN
  Administered 2017-05-28: 1 via OROMUCOSAL
  Filled 2017-05-27 (×2): qty 177

## 2017-05-27 MED ORDER — LEVOTHYROXINE SODIUM 75 MCG PO TABS
75.0000 ug | ORAL_TABLET | Freq: Every day | ORAL | Status: DC
  Administered 2017-05-28 – 2017-05-31 (×4): 75 ug via ORAL
  Filled 2017-05-27 (×4): qty 1

## 2017-05-27 MED ORDER — IPRATROPIUM-ALBUTEROL 0.5-2.5 (3) MG/3ML IN SOLN
3.0000 mL | Freq: Four times a day (QID) | RESPIRATORY_TRACT | Status: DC
  Administered 2017-05-27 – 2017-05-28 (×4): 3 mL via RESPIRATORY_TRACT
  Filled 2017-05-27 (×5): qty 3

## 2017-05-27 MED ORDER — ENOXAPARIN SODIUM 40 MG/0.4ML ~~LOC~~ SOLN
40.0000 mg | SUBCUTANEOUS | Status: DC
  Administered 2017-05-27 – 2017-05-30 (×4): 40 mg via SUBCUTANEOUS
  Filled 2017-05-27 (×4): qty 0.4

## 2017-05-27 MED ORDER — SODIUM CHLORIDE 0.9 % IV SOLN
1000.0000 mL | INTRAVENOUS | Status: DC
  Administered 2017-05-27 – 2017-05-29 (×7): 1000 mL via INTRAVENOUS

## 2017-05-27 MED ORDER — LINAGLIPTIN 5 MG PO TABS
5.0000 mg | ORAL_TABLET | Freq: Every evening | ORAL | Status: DC
  Administered 2017-05-27 – 2017-05-30 (×4): 5 mg via ORAL
  Filled 2017-05-27 (×5): qty 1

## 2017-05-27 MED ORDER — ONDANSETRON HCL 4 MG/2ML IJ SOLN: 4.0000 mg | Freq: Four times a day (QID) | INTRAMUSCULAR | Status: DC | PRN

## 2017-05-27 MED ORDER — SIMVASTATIN 20 MG PO TABS
20.0000 mg | ORAL_TABLET | Freq: Every day | ORAL | Status: DC
  Administered 2017-05-27 – 2017-05-30 (×4): 20 mg via ORAL
  Filled 2017-05-27 (×4): qty 1

## 2017-05-27 MED ORDER — PANTOPRAZOLE SODIUM 40 MG PO TBEC
40.0000 mg | DELAYED_RELEASE_TABLET | Freq: Every day | ORAL | Status: DC
  Administered 2017-05-27 – 2017-05-31 (×5): 40 mg via ORAL
  Filled 2017-05-27 (×5): qty 1

## 2017-05-27 MED ORDER — SODIUM CHLORIDE 0.9 % IV BOLUS (SEPSIS)
1000.0000 mL | Freq: Once | INTRAVENOUS | Status: AC
  Administered 2017-05-27: 1000 mL via INTRAVENOUS

## 2017-05-27 MED ORDER — LORATADINE 10 MG PO TABS
10.0000 mg | ORAL_TABLET | Freq: Every day | ORAL | Status: DC
  Administered 2017-05-27 – 2017-05-31 (×5): 10 mg via ORAL
  Filled 2017-05-27 (×5): qty 1

## 2017-05-27 MED ORDER — INSULIN ASPART 100 UNIT/ML ~~LOC~~ SOLN
0.0000 [IU] | SUBCUTANEOUS | Status: DC
  Administered 2017-05-27: 3 [IU] via SUBCUTANEOUS
  Administered 2017-05-27 (×2): 5 [IU] via SUBCUTANEOUS
  Administered 2017-05-28 (×2): 3 [IU] via SUBCUTANEOUS
  Administered 2017-05-28: 2 [IU] via SUBCUTANEOUS
  Administered 2017-05-28 (×3): 3 [IU] via SUBCUTANEOUS
  Administered 2017-05-29: 5 [IU] via SUBCUTANEOUS
  Administered 2017-05-29: 3 [IU] via SUBCUTANEOUS
  Administered 2017-05-29: 2 [IU] via SUBCUTANEOUS
  Administered 2017-05-29: 3 [IU] via SUBCUTANEOUS
  Administered 2017-05-29 – 2017-05-30 (×2): 5 [IU] via SUBCUTANEOUS
  Administered 2017-05-30 (×3): 3 [IU] via SUBCUTANEOUS
  Administered 2017-05-30 – 2017-05-31 (×3): 2 [IU] via SUBCUTANEOUS
  Filled 2017-05-27: qty 1

## 2017-05-27 MED ORDER — POTASSIUM CHLORIDE 10 MEQ/100ML IV SOLN
10.0000 meq | INTRAVENOUS | Status: AC
  Administered 2017-05-27 (×2): 10 meq via INTRAVENOUS
  Filled 2017-05-27 (×2): qty 100

## 2017-05-27 MED ORDER — GLIMEPIRIDE 1 MG PO TABS
0.5000 mg | ORAL_TABLET | Freq: Every day | ORAL | Status: DC
  Administered 2017-05-27 – 2017-05-31 (×5): 0.5 mg via ORAL
  Filled 2017-05-27 (×5): qty 0.5

## 2017-05-27 MED ORDER — VANCOMYCIN HCL IN DEXTROSE 1-5 GM/200ML-% IV SOLN
1000.0000 mg | Freq: Once | INTRAVENOUS | Status: AC
  Administered 2017-05-27: 1000 mg via INTRAVENOUS
  Filled 2017-05-27: qty 200

## 2017-05-27 MED ORDER — ACETAMINOPHEN 500 MG PO TABS
1000.0000 mg | ORAL_TABLET | Freq: Once | ORAL | Status: AC
Start: 2017-05-27 — End: 2017-05-27
  Administered 2017-05-27: 1000 mg via ORAL
  Filled 2017-05-27: qty 2

## 2017-05-27 MED ORDER — BENZONATATE 100 MG PO CAPS
100.0000 mg | ORAL_CAPSULE | Freq: Three times a day (TID) | ORAL | Status: DC | PRN
  Administered 2017-05-27: 100 mg via ORAL
  Filled 2017-05-27 (×2): qty 1

## 2017-05-27 MED ORDER — GUAIFENESIN-DM 100-10 MG/5ML PO SYRP
5.0000 mL | ORAL_SOLUTION | ORAL | Status: DC | PRN
  Administered 2017-05-30 (×2): 5 mL via ORAL
  Filled 2017-05-27 (×3): qty 5

## 2017-05-27 MED ORDER — ONDANSETRON HCL 4 MG PO TABS
4.0000 mg | ORAL_TABLET | Freq: Four times a day (QID) | ORAL | Status: DC | PRN
  Administered 2017-05-29: 4 mg via ORAL
  Filled 2017-05-27: qty 1

## 2017-05-27 MED ORDER — DEXTROSE 5 % IV SOLN
1.0000 g | INTRAVENOUS | Status: DC
  Administered 2017-05-27 – 2017-05-29 (×3): 1 g via INTRAVENOUS
  Filled 2017-05-27 (×5): qty 10

## 2017-05-27 MED ORDER — DEXTROSE 5 % IV SOLN
500.0000 mg | INTRAVENOUS | Status: DC
  Administered 2017-05-27 – 2017-05-29 (×3): 500 mg via INTRAVENOUS
  Filled 2017-05-27 (×4): qty 500

## 2017-05-27 NOTE — ED Triage Notes (Signed)
Per EMS, pt from home. Pt c/o not feeling well past couple days with increased sob that got worse tonight. Pt used albuterol inhaler with no relief. 101F at home. Wheezing in all fields. EMS gave 5mg  Albuterol tx with some improvement, rt side still wheezing. Another 5mg  albuterol and 0.5 atrovent given. EMS VS 100% room air, HR 80, 157/82. CBG 123 hx diabetes.

## 2017-05-27 NOTE — Progress Notes (Signed)
05/27/2017 Patient came to the emergency room to 27M at 1620. She is alert, oriented and ambulatory. Patient have swelling in bilateral lower extremities, crack of buttocks pink, but blanches and bilateral heels dry pink and blanches. Patient was place on Dro[place], receive CHG bath and MRSA swab. [facility] RN.

## 2017-05-27 NOTE — ED Provider Notes (Addendum)
TIME SEEN: 3:14 AM  CHIEF COMPLAINT: fever, cough, shortness of breath  HPI: Pt is a 76 year old female with history of diabetes, hypertension, hyperlipidemia, hypothyroidism, chronic kidney disease who presents emergency department with feeling poorly for the past several days. Tonight she had a fever at home of 101. She has several days of cough with white and yellow tinged sputum. States she felt more short of breath tonight and called EMS. Tried her albuterol inhaler without relief. She has no history of asthma or COPD. With EMS, patient was not hypoxic. Room air saturation was 100%. They did give her 25 mg albuterol treatments and 0.5 mg of Atrovent with some improvement in symptoms. She denies headache, neck pain or neck stiffness, chest pain, abdominal pain, vomiting or diarrhea, rash. No recent sick contacts, travel or hospitalization. No tick bites.  ROS: See HPI Constitutional:  fever  Eyes: no drainage  ENT: no runny nose   Cardiovascular:  no chest pain  Resp:  SOB  GI: no vomiting GU: no dysuria Integumentary: no rash  Allergy: no hives  Musculoskeletal: no leg swelling  Neurological: no slurred speech ROS otherwise negative  PAST MEDICAL HISTORY/PAST SURGICAL HISTORY:  Past Medical History:  Diagnosis Date  . Allergy   . Arthritis    hands and feet   . Cataract    removed bilateral  . Chronic kidney disease    stageIII  . Diabetes mellitus without complication ([place])   . GERD (gastroesophageal reflux disease)   . Hypercholesteremia   . Hypertension   . Hypothyroid     MEDICATIONS:  Prior to Admission medications   Medication Sig Start Date End Date Taking? Authorizing Provider  acetaminophen (TYLENOL) 325 MG tablet Take 650 mg by mouth every 6 (six) hours as needed for mild pain.    [provider]  albuterol (PROVENTIL HFA;VENTOLIN HFA) 108 (90 BASE) MCG/ACT inhaler Inhale 2 puffs into the lungs every 6 (six) hours as needed. For wheezing.    [provider]  cetirizine (ZYRTEC) 10 MG tablet Take 10 mg by mouth daily.    [provider]  empagliflozin (JARDIANCE) 10 MG TABS tablet Take 10 mg by mouth daily.    [provider]  furosemide (LASIX) 40 MG tablet TAKE 1/2 TO 1 TAB BY MOUTH EVERY DAY FOR EDEMA AS DIRECTED 04/04/17   [provider]  levothyroxine (SYNTHROID, LEVOTHROID) 75 MCG tablet Take 75 mcg by mouth daily.    [provider]  lisinopril (PRINIVIL,ZESTRIL) 20 MG tablet Take 20 mg by mouth 2 (two) times daily. 05/31/15   [provider]  MELATONIN PO Take 1 capsule by mouth at bedtime.    [provider]  omeprazole (PRILOSEC) 20 MG capsule  10/11/16   [provider]  simvastatin (ZOCOR) 20 MG tablet Take 20 mg by mouth daily. 06/03/15   [provider]  TRADJENTA 5 MG TABS tablet  11/13/16   [provider]    ALLERGIES:  Allergies  Allergen Reactions  . Metformin And Related Other (See Comments)    SOCIAL HISTORY:  Social History  Substance Use Topics  . Smoking status: Former Smoker    Types: Cigarettes    Quit date: 10/01/1964  . Smokeless tobacco: Never Used  . Alcohol use No    FAMILY HISTORY: Family History  Problem Relation Age of Onset  . Alzheimer's disease Mother   . Heart attack Father   . Heart disease Father   . Dwarfism Sister   .  Colon cancer Neg Hx   . Colon polyps Neg Hx   . Esophageal cancer Neg Hx   . Rectal cancer Neg Hx   . Stomach cancer Neg Hx     EXAM: BP 135/61   Pulse 90   Temp (S) (!) 100.5 F (38.1 C) (Rectal)   Resp (!) 24   Ht 5' (1.524 m)   Wt 65.3 kg (144 lb)   SpO2 97%   BMI 28.12 kg/m  CONSTITUTIONAL: Alert and oriented and responds appropriately to questions. elderly HEAD: Normocephalic EYES: Conjunctivae clear, pupils appear equal, EOMI ENT: normal nose; moist mucous membranes NECK: Supple, no meningismus, no nuchal rigidity, no LAD  CARD: RRR; S1 and S2 appreciated; no  murmurs, no clicks, no rubs, no gallops RESP: Normal chest excursion without splinting, mild tachypnea, no hypoxia on room air, scattered expert or wheezes, no rhonchi or rales, speaking full sentences ABD/GI: Normal bowel sounds; non-distended; soft, non-tender, no rebound, no guarding, no peritoneal signs, no hepatosplenomegaly BACK:  The back appears normal and is non-tender to palpation, there is no CVA tenderness EXT: Normal ROM in all joints; non-tender to palpation; no edema; normal capillary refill; no cyanosis, no calf tenderness or swelling    SKIN: Normal color for age and race; warm; no rash NEURO: Moves all extremities equally PSYCH: The patient's mood and manner are appropriate. Grooming and personal hygiene are appropriate.  MEDICAL DECISION MAKING: Patient here with fever at home, productive cough. Concern for possible pneumonia. She is tachypneic here but not hypoxic. We'll continue breathing treatments as needed. We'll obtain labs including cultures, chest x-ray and urine. Will give antibiotics and IV fluids. I feel she will need admission. She does not appear volume overloaded.  ED PROGRESS: 4:05 AM  Pt's wheezing has improved with DuoNeb treatments. Chest x-ray shows left lower lobe pneumonia. She has leukocytosis with left shift and mildly elevated lactate. Anticipate admission. We'll give second DuoNeb treatment.  5:55 AM  Pt stable.  We'll discuss with medicine for admission for pneumonia.   6:24 AM Discussed patient's case with hospitalist, Dr. Hal Hope.  I have recommended admission and patient (and family if present) agree with this plan. Admitting physician will place admission orders.   I reviewed all nursing notes, vitals, pertinent previous records, EKGs, lab and urine results, imaging (as available).   6:43 AM Pt's repeat lactate has gone up to 5.38. She has received approximately 1.5 L of IV fluids. Will give another liter bolus. She is sitting upright in the  bed talking on the phone. She is hemodynamically stable. She has no current complaints. No respiratory distress. No hypoxia. Doing well on room air.    EKG Interpretation  Date/Time:  Monday May 27 2017 03:08:35 EDT Ventricular Rate:  84 PR Interval:    QRS Duration: 93 QT Interval:  372 QTC Calculation: 440 R Axis:   -12 Text Interpretation:  Sinus rhythm Low voltage, precordial leads Consider anterior infarct No significant change since last tracing Confirmed by Ward, Cyril Mourning [phone]) on 05/27/2017 3:14:29 AM         Ward, Delice Bison, DO 05/27/17 [place], Delice Bison, DO 05/27/17 [phone]

## 2017-05-27 NOTE — ED Notes (Addendum)
Hospitalist made aware that lactic acid is 4.1.

## 2017-05-27 NOTE — ED Notes (Signed)
No residual urine volume.

## 2017-05-27 NOTE — Progress Notes (Addendum)
Lactic acid has decreased to 3.6-continue to cycle. Consider decrease IVF rate once lactate normalized.  248 pm: Lactic acid up slightly to 4.1- continue to cycle- Resp viral panel positive for rhinovirus  Erin Hearing, ANP

## 2017-05-27 NOTE — Progress Notes (Signed)
CRITICAL VALUE ALERT  Critical Value:  Lactic acid 3.6  Date & Time Notied:  05/27/17 11:04 PM   Provider Notified: Myna Hidalgo, MD  Orders Received/Actions taken:

## 2017-05-27 NOTE — ED Notes (Addendum)
Delay in lab draw,  Pt in bathroom. 

## 2017-05-27 NOTE — Progress Notes (Addendum)
CRITICAL VALUE ALERT  Critical Value:  Lactic acid 3.7  Date & Time Notied:  05/27/17 7:42 PM   Provider Notified: Myna Hidalgo, MD  Orders Received/Actions taken: continue current plan of care and reassess what next lactic acid is.

## 2017-05-27 NOTE — ED Notes (Signed)
Sprite zero given to patient.

## 2017-05-27 NOTE — H&P (Signed)
History and Physical    Amber Gray EXN:[id number] DOB: 1941/01/25 DOA: 05/27/2017   PCP: [name], MD   Attending physician: Marily Memos  Patient coming from/Resides with: Private residence/alone  Chief Complaint: Cough and fever  HPI: Amber Gray is a 76 y.o. female with medical history significant for hypothyroidism, hypertension, dyslipidemia, stage III chronic kidney disease, former tobacco abuse, and diabetes on oral agents. Patient presents after experiencing 2 days of productive cough with yellow sputum, fever and chills. Positive pleuritic chest discomfort with inspiration and coughing. No known sick contacts. In the ER she was found to have a left lower lobe pneumonia. She had positive sepsis physiology with a white count of 12,600 with a lactic acid of 2.29, mild acute kidney injury and hyperglycemia as well as decreased CO2 17. She was given 3 L of IV fluids and started on empiric broad-spectrum antibiotics by the EDP but second lactic acid increased at 5.38 and therefore she will be admitted to the stepdown unit for further evaluation and treatment of sepsis secondary to pneumonia.  ED Course:  Vital Signs: BP (!) 106/51   Pulse 75   Temp 98.3 F (36.8 C) (Oral)   Resp 19   Ht 5' (1.524 m)   Wt 65.3 kg (144 lb)   SpO2 97%   BMI 28.12 kg/m  PCXR: Patchy atelectasis persistent minimal infiltrate left base Lab data: Sodium 138, potassium 3.2, chloride 109, CO2 17, glucose 2:30, BUN 15, creatinine 1.18, calcium 7.9, anion gap 12, albumin 3.2, lactic acid 2.29 with increase to 5.38, white count 12,600 with neutrophils 69% and absolute neutrophils 8.7%, urinalysis unremarkable except for glycosuria greater than 500, blood culture and urine culture obtained in the ER Medications and treatments: Normal saline bolus 3 L, Zosyn 3.375 g IV 1, vancomycin 1 g 1, DuoNeb 1  Review of Systems:  In addition to the HPI above,  No Headache, changes with Vision or  hearing, new weakness, tingling, numbness in any extremity, dizziness, dysarthria or word finding difficulty, gait disturbance or imbalance, tremors or seizure activity No problems swallowing food or Liquids, indigestion/reflux, choking or coughing while eating, abdominal pain with or after eating No Chest pain, Shortness of Breath, palpitations, orthopnea or DOE No Abdominal pain, N/V, melena,hematochezia, dark tarry stools, constipation No dysuria, malodorous urine, hematuria or flank pain No new skin rashes, lesions, masses or bruises, No new joint pains, aches, swelling or redness No recent unintentional weight gain or loss No polyuria, polydypsia or polyphagia   Past Medical History:  Diagnosis Date  . Allergy   . Arthritis    hands and feet   . Cataract    removed bilateral  . Chronic kidney disease    stageIII  . Diabetes mellitus without complication ([place])   . GERD (gastroesophageal reflux disease)   . Hypercholesteremia   . Hypertension   . Hypothyroid     Past Surgical History:  Procedure Laterality Date  . CERVICAL LAMINECTOMY    . COLONOSCOPY  10/25/2005   normal    Social History   Social History  . Marital status: Single    Spouse name: N/A  . Number of children: N/A  . Years of education: N/A   Occupational History  . Not on file.   Social History Main Topics  . Smoking status: Former Smoker    Types: Cigarettes    Quit date: 10/01/1964  . Smokeless tobacco: Never Used  . Alcohol use No  . Drug use: No  .  Sexual activity: No   Other Topics Concern  . Not on file   Social History Narrative  . No narrative on file    Mobility: Rolling walker: Would like to have a walker with wheels if possible Work history: Not obtained   Allergies  Allergen Reactions  . Metformin And Related Other (See Comments)    Family History  Problem Relation Age of Onset  . Alzheimer's disease Mother   . Heart attack Father   . Heart disease Father   .  Dwarfism Sister   . Colon cancer Neg Hx   . Colon polyps Neg Hx   . Esophageal cancer Neg Hx   . Rectal cancer Neg Hx   . Stomach cancer Neg Hx      Prior to Admission medications   Medication Sig Start Date End Date Taking? Authorizing Provider  acetaminophen (TYLENOL) 325 MG tablet Take 650 mg by mouth every 6 (six) hours as needed for mild pain.   Yes [provider]  albuterol (PROVENTIL HFA;VENTOLIN HFA) 108 (90 BASE) MCG/ACT inhaler Inhale 2 puffs into the lungs every 6 (six) hours as needed. For wheezing.   Yes [provider]  cetirizine (ZYRTEC) 10 MG tablet Take 10 mg by mouth daily.   Yes [provider]  empagliflozin (JARDIANCE) 10 MG TABS tablet Take 10 mg by mouth daily.   Yes [provider]  furosemide (LASIX) 40 MG tablet TAKE 1/2 TO 1 TAB BY MOUTH EVERY DAY FOR EDEMA AS DIRECTED 04/04/17  Yes [provider]  glimepiride (AMARYL) 1 MG tablet Take 0.5 mg by mouth daily. 05/09/17  Yes [provider]  levothyroxine (SYNTHROID, LEVOTHROID) 75 MCG tablet Take 75 mcg by mouth daily.   Yes [provider]  lisinopril (PRINIVIL,ZESTRIL) 20 MG tablet Take 20 mg by mouth 2 (two) times daily. 05/31/15  Yes [provider]  omeprazole (PRILOSEC) 20 MG capsule Take 20 mg by mouth daily. 04/04/17  Yes [provider]  simvastatin (ZOCOR) 20 MG tablet Take 20 mg by mouth daily. 06/03/15  Yes [provider]  TRADJENTA 5 MG TABS tablet Take 5 mg by mouth every evening.  11/13/16  Yes [provider]    Physical Exam: Vitals:   05/27/17 0530 05/27/17 0545 05/27/17 0603 05/27/17 0645  BP: 120/62 (!) 119/55  (!) 106/51  Pulse: 96 83  75  Resp: (!) 22 18  19   Temp:   98.3 F (36.8 C)   TempSrc:   Oral   SpO2: 93% 94%  97%  Weight:      Height:          Constitutional: NAD, calm, comfortable Eyes: PERRL, lids and conjunctivae normal-Bilateral scleral injection ENMT: Mucous membranes are  dry. Posterior pharynx clear of any exudate or lesions.Normal dentition.  Neck: normal, supple, no masses, no thyromegaly Respiratory: Coarse to auscultation with crackles/rhonchi left mid lung field and scattered fine expiratory wheezing. Normal respiratory effort without accessory muscle use. RA Cardiovascular: Regular rate and rhythm, no murmurs / rubs / gallops. No extremity edema. 2+ pedal pulses. No carotid bruits.  Abdomen: no tenderness, no masses palpated. No hepatosplenomegaly. Bowel sounds positive.  Musculoskeletal: no clubbing / cyanosis. No joint deformity upper and lower extremities. Good ROM, no contractures. Normal muscle tone.  Skin: no rashes, lesions, ulcers. No induration Neurologic: CN 2-12 grossly intact. Sensation intact, DTR normal. Strength 5/5 x all 4 extremities.  Psychiatric: Normal judgment and insight. Alert and oriented x 3. Normal mood.  Labs on Admission: I have personally reviewed following labs and imaging studies  CBC:  Recent Labs Lab 05/27/17 0308  WBC 12.6*  NEUTROABS 8.7*  HGB 13.3  HCT 39.1  MCV 89.3  PLT 672   Basic Metabolic Panel:  Recent Labs Lab 05/27/17 0457  NA 138  K 3.2*  CL 109  CO2 17*  GLUCOSE 230*  BUN 15  CREATININE 1.18*  CALCIUM 7.9*   GFR: Estimated Creatinine Clearance: 34.2 mL/min (A) (by C-G formula based on SCr of 1.18 mg/dL (H)). Liver Function Tests:  Recent Labs Lab 05/27/17 0457  AST 24  ALT 12*  ALKPHOS 49  BILITOT 0.5  PROT 6.4*  ALBUMIN 3.2*   No results for input(s): LIPASE, AMYLASE in the last 168 hours. No results for input(s): AMMONIA in the last 168 hours. Coagulation Profile: No results for input(s): INR, PROTIME in the last 168 hours. Cardiac Enzymes: No results for input(s): CKTOTAL, CKMB, CKMBINDEX, TROPONINI in the last 168 hours. BNP (last 3 results) No results for input(s): PROBNP in the last 8760 hours. HbA1C: No results for input(s): HGBA1C in the last 72  hours. CBG: No results for input(s): GLUCAP in the last 168 hours. Lipid Profile: No results for input(s): CHOL, HDL, LDLCALC, TRIG, CHOLHDL, LDLDIRECT in the last 72 hours. Thyroid Function Tests: No results for input(s): TSH, T4TOTAL, FREET4, T3FREE, THYROIDAB in the last 72 hours. Anemia Panel: No results for input(s): VITAMINB12, FOLATE, FERRITIN, TIBC, IRON, RETICCTPCT in the last 72 hours. Urine analysis:    Component Value Date/Time   COLORURINE STRAW (A) 05/27/2017 0627   APPEARANCEUR CLEAR 05/27/2017 0627   LABSPEC 1.011 05/27/2017 0627   PHURINE 6.0 05/27/2017 0627   GLUCOSEU >=500 (A) 05/27/2017 0627   HGBUR NEGATIVE 05/27/2017 0627   BILIRUBINUR NEGATIVE 05/27/2017 0627   KETONESUR NEGATIVE 05/27/2017 0627   PROTEINUR NEGATIVE 05/27/2017 0627   UROBILINOGEN 1.0 09/25/2012 1352   NITRITE NEGATIVE 05/27/2017 0627   LEUKOCYTESUR NEGATIVE 05/27/2017 0627   Sepsis Labs: @LABRCNTIP (procalcitonin:4,lacticidven:4) )No results found for this or any previous visit (from the past 240 hour(s)).   Radiological Exams on Admission: Dg Chest Port 1 View  Result Date: 05/27/2017 CLINICAL DATA:  Fever and cough EXAM: PORTABLE CHEST 1 VIEW COMPARISON:  06/26/2017 FINDINGS: Post surgical hardware in the cervical spine. Low lung volumes. Patchy atelectasis versus small infiltrate at the left base. Stable cardiomediastinal silhouette with atherosclerosis. No pneumothorax. IMPRESSION: Low lung volumes with patchy atelectasis versus minimal infiltrate at the left base Electronically Signed   By: Donavan Foil M.D.   On: 05/27/2017 03:34    EKG: (Independently reviewed) Sinus rhythm with ventricular response 84 bpm, QTC 440 ms, normal R-wave a, low voltage complexes  Assessment/Plan Principal Problem:   Sepsis due to pneumonia  -Presents with fever, chills, productive cough with findings of left-sided pneumonia on chest x-ray -Lactic acid has trended upward so admit to stepdown and  continue to cycle -Normal saline 1 L 1 and increase maintenance fluids to 150/hr -Procalcitonin -Respiratory viral panel -Community-acquired pneumonia so we'll narrow antibiotics to Rocephin and Zithromax -Follow-up on blood cultures (as well as urine culture ordered in the ER) -Sputum culture, urinary strep, HIV per pneumonia order set -Volume depleted so hold Jardiance and Lasix -DuoNeb's -Solu-Medrol 60 mg IV every 12 hours plus budesonide nebs  Active Problems:   Diabetes mellitus type 2, uncontrolled  -CBGs greater than 200 with normal anion gap -SSI q 4 hrs during at least the first 24 hours -Continue  low-dose Amaryl and Tradjenta -Holding Jardiance    CKD (chronic kidney disease), stage III -Renal function stable with only slight elevation in creatinine i.e. mild acute kidney injury in setting of volume depletion and sepsis physiology -Follow labs    Acute hypokalemia -KCL 10 meq IV bolus 4    HTN (hypertension) -Hold preadmission ACE inhibitor and Lasix    Hypothyroid -Continue Synthroid    Hypercholesteremia -Continue statin      DVT prophylaxis: Lovenox Code Status: DO NOT RESUSCITATE: Okay with pressors but if ineffective do not escalate care  Family Communication: None Disposition Plan: Home pending PT evaluation Consults called: None     Tylar Merendino L. ANP-BC [facility] Pager [phone]   If 7PM-7AM, please contact night-coverage [email or web address] Password [email or web address]  05/27/2017, 7:23 AM

## 2017-05-27 NOTE — ED Notes (Signed)
Patient has been given specimen cup for sputum sample and made aware that one is needed.

## 2017-05-28 ENCOUNTER — Inpatient Hospital Stay (HOSPITAL_COMMUNITY): Payer: Medicare HMO

## 2017-05-28 DIAGNOSIS — J181 Lobar pneumonia, unspecified organism: Secondary | ICD-10-CM

## 2017-05-28 DIAGNOSIS — I10 Essential (primary) hypertension: Secondary | ICD-10-CM

## 2017-05-28 DIAGNOSIS — N183 Chronic kidney disease, stage 3 (moderate): Secondary | ICD-10-CM

## 2017-05-28 DIAGNOSIS — J9601 Acute respiratory failure with hypoxia: Secondary | ICD-10-CM

## 2017-05-28 DIAGNOSIS — E1121 Type 2 diabetes mellitus with diabetic nephropathy: Secondary | ICD-10-CM

## 2017-05-28 DIAGNOSIS — E784 Other hyperlipidemia: Secondary | ICD-10-CM

## 2017-05-28 LAB — HIV ANTIBODY (ROUTINE TESTING W REFLEX): HIV Screen 4th Generation wRfx: NONREACTIVE

## 2017-05-28 LAB — COMPREHENSIVE METABOLIC PANEL
ALK PHOS: 47 U/L (ref 38–126)
ALT: 13 U/L — AB (ref 14–54)
AST: 13 U/L — AB (ref 15–41)
Albumin: 3 g/dL — ABNORMAL LOW (ref 3.5–5.0)
Anion gap: 6 (ref 5–15)
BILIRUBIN TOTAL: 0.6 mg/dL (ref 0.3–1.2)
BUN: 12 mg/dL (ref 6–20)
CALCIUM: 7.5 mg/dL — AB (ref 8.9–10.3)
CHLORIDE: 117 mmol/L — AB (ref 101–111)
CO2: 17 mmol/L — ABNORMAL LOW (ref 22–32)
CREATININE: 0.87 mg/dL (ref 0.44–1.00)
Glucose, Bld: 172 mg/dL — ABNORMAL HIGH (ref 65–99)
Potassium: 4.3 mmol/L (ref 3.5–5.1)
Sodium: 140 mmol/L (ref 135–145)
TOTAL PROTEIN: 6.3 g/dL — AB (ref 6.5–8.1)

## 2017-05-28 LAB — GLUCOSE, CAPILLARY
GLUCOSE-CAPILLARY: 154 mg/dL — AB (ref 65–99)
GLUCOSE-CAPILLARY: 162 mg/dL — AB (ref 65–99)
GLUCOSE-CAPILLARY: 184 mg/dL — AB (ref 65–99)
GLUCOSE-CAPILLARY: 178 mg/dL — AB (ref 65–99)
GLUCOSE-CAPILLARY: 185 mg/dL — AB (ref 65–99)
Glucose-Capillary: 143 mg/dL — ABNORMAL HIGH (ref 65–99)

## 2017-05-28 LAB — LACTIC ACID, PLASMA
Lactic Acid, Venous: 0.9 mmol/L (ref 0.5–1.9)
Lactic Acid, Venous: 0.9 mmol/L (ref 0.5–1.9)

## 2017-05-28 LAB — URINE CULTURE: Culture: NO GROWTH

## 2017-05-28 LAB — CBC
HCT: 33.9 % — ABNORMAL LOW (ref 36.0–46.0)
Hemoglobin: 10.9 g/dL — ABNORMAL LOW (ref 12.0–15.0)
MCH: 29.2 pg (ref 26.0–34.0)
MCHC: 32.2 g/dL (ref 30.0–36.0)
MCV: 90.9 fL (ref 78.0–100.0)
PLATELETS: 173 10*3/uL (ref 150–400)
RBC: 3.73 MIL/uL — AB (ref 3.87–5.11)
RDW: 14.1 % (ref 11.5–15.5)
WBC: 15 10*3/uL — ABNORMAL HIGH (ref 4.0–10.5)

## 2017-05-28 LAB — LEGIONELLA PNEUMOPHILA SEROGP 1 UR AG: L. PNEUMOPHILA SEROGP 1 UR AG: NEGATIVE

## 2017-05-28 MED ORDER — IPRATROPIUM-ALBUTEROL 0.5-2.5 (3) MG/3ML IN SOLN
3.0000 mL | Freq: Four times a day (QID) | RESPIRATORY_TRACT | Status: DC
  Administered 2017-05-29 – 2017-05-31 (×8): 3 mL via RESPIRATORY_TRACT
  Filled 2017-05-28 (×9): qty 3

## 2017-05-28 MED ORDER — ALBUTEROL SULFATE (2.5 MG/3ML) 0.083% IN NEBU
2.5000 mg | INHALATION_SOLUTION | RESPIRATORY_TRACT | Status: DC | PRN
  Administered 2017-05-28 – 2017-05-29 (×3): 2.5 mg via RESPIRATORY_TRACT
  Filled 2017-05-28 (×3): qty 3

## 2017-05-28 MED ORDER — IPRATROPIUM-ALBUTEROL 0.5-2.5 (3) MG/3ML IN SOLN
3.0000 mL | Freq: Two times a day (BID) | RESPIRATORY_TRACT | Status: DC
  Administered 2017-05-28: 3 mL via RESPIRATORY_TRACT
  Filled 2017-05-28: qty 3

## 2017-05-28 NOTE — Progress Notes (Signed)
SATURATION QUALIFICATIONS: (This note is used to comply with regulatory documentation for home oxygen)  Patient Saturations on Room Air at Rest = 95  Patient Saturations on Room Air while Ambulating = 99%  Patient Saturations on 0 Liters of oxygen while Ambulating = 99%  Please briefly explain why patient needs home oxygen: patient has no need for home oxygen

## 2017-05-28 NOTE — Progress Notes (Signed)
PROGRESS NOTE    Amber Gray  IRS:[id number] DOB: 1940-12-19 DOA: 05/27/2017 PCP: [name], MD   Brief Narrative:  76 y.o. WF PMHx Hypothyroidism, HTN, Dyslipidemia, CKD stage III, former Tobacco abuse Diabetes Type 2  uncontrolled with renal complication on PO agents.   Presents after experiencing 2 days of productive cough with yellow sputum, fever and chills. Positive pleuritic chest discomfort with inspiration and coughing. No known sick contacts. In the ER she was found to have a left lower lobe pneumonia. She had positive sepsis physiology with a white count of 12,600 with a lactic acid of 2.29, mild acute kidney injury and hyperglycemia as well as decreased CO2 17. She was given 3 L of IV fluids and started on empiric broad-spectrum antibiotics by the EDP but second lactic acid increased at 5.38 and therefore she will be admitted to the stepdown unit for further evaluation and treatment of sepsis secondary to pneumonia.    Subjective:    Assessment & Plan:   Principal Problem:   Sepsis due to pneumonia [facility]) Active Problems:   CKD (chronic kidney disease), stage III   Diabetes mellitus type 2, uncontrolled ([facility])   Hypothyroid   Hypercholesteremia   Acute hypokalemia   HTN (hypertension)   Sepsis due to pneumonia/ positive rhinovirus/enterovirus/CAP -Presents with fever, chills, productive cough with findings of left-sided pneumonia on chest x-ray -Normal saline 119ml/hr -Complete seven-day course antibiotics. Transition over to PO in the a.m. -DuoNeb QID -Solu-Medrol 60 mg BID  Acute respiratory failure with hypoxia -Although patient not dropping her SPO2 on short walks, becomes extremely fatigued, lightheaded (dizzy), must sit down quickly. Not appropriate for discharge   Diabetes mellitus type 2, uncontrolled with renal complication -0/09 start Lantus 5 units daily -Moderate SSI -SSI q 4 hrs during at least the first 24 hours -Continue low-dose Amaryl  and Tradjenta -Holding Jardiance   CKD (chronic kidney disease), stage III -Resolved -Continue to trend     Acute hypokalemia -Resolved     HTN (hypertension) -Hold preadmission ACE inhibitor and Lasix     Hypothyroid -Continue Synthroid     Hypercholesteremia -Continue statin  Hypocalcemia    DVT prophylaxis: Lovenox Code Status: DO NOT RESUSCITATE Family Communication: None Disposition Plan: 24 -48 hours   Consultants:  None   Procedures/Significant Events:  None   I have personally reviewed and interpreted all radiology studies and my findings are as above.  VENTILATOR SETTINGS: None   Cultures 8/27 blood NGTD 8/27 urine negative 8/27 sputum not acceptable 8/27 MRSA by PCR negative 8/27 HIV negative 8/27 respiratory virus panel positive rhinovirus/enterovirus   Antimicrobials: Anti-infectives    Start     Stop   05/27/17 1000  azithromycin (ZITHROMAX) 500 mg in dextrose 5 % 250 mL IVPB     06/03/17 0959   05/27/17 0800  cefTRIAXone (ROCEPHIN) 1 g in dextrose 5 % 50 mL IVPB     06/03/17 0759   05/27/17 0330  piperacillin-tazobactam (ZOSYN) IVPB 3.375 g     05/27/17 0438   05/27/17 0330  vancomycin (VANCOCIN) IVPB 1000 mg/200 mL premix     05/27/17 0508        Devices    LINES / TUBES:      Continuous Infusions: . sodium chloride 1,000 mL (05/28/17 0625)  . azithromycin Stopped (05/28/17 1150)  . cefTRIAXone (ROCEPHIN)  IV Stopped (05/28/17 0830)     Objective: Vitals:   05/28/17 0700 05/28/17 0714 05/28/17 0715 05/28/17 0751  BP: 119/60  134/64  Pulse: 65   92  Resp: 17   (!) 21  Temp:    97.9 F (36.6 C)  TempSrc:    Oral  SpO2: 95% 96% 96% 94%  Weight:      Height:        Intake/Output Summary (Last 24 hours) at 05/28/17 1223 Last data filed at 05/27/17 1501  Gross per 24 hour  Intake              450 ml  Output                0 ml  Net              450 ml   Filed Weights   05/27/17 0330 05/28/17 0455    Weight: 144 lb (65.3 kg) 156 lb 12 oz (71.1 kg)    Examination:  General: A/O 4, positive Acute respiratory distress Neck:  Negative scars, masses, torticollis, lymphadenopathy, JVD Lungs: diffuse expiratory wheezes, positive bibasilar crackles  Cardiovascular: Regular rate and rhythm without murmur gallop or rub normal S1 and S2 Abdomen: negative abdominal pain, nondistended, positive soft, bowel sounds, no rebound, no ascites, no appreciable mass Extremities: No significant cyanosis, clubbing, or edema bilateral lower extremities Skin: Negative rashes, lesions, ulcers Psychiatric:  Negative depression, negative anxiety, negative fatigue, negative mania  Central nervous system:  Cranial nerves II through XII intact, tongue/uvula midline, all extremities muscle strength 5/5, sensation intact throughout, fnegative dysarthria, negative expressive aphasia, negative receptive aphasia.  .     Data Reviewed: Care during the described time interval was provided by me .  I have reviewed this patient's available data, including medical history, events of note, physical examination, and all test results as part of my evaluation.   CBC:  Recent Labs Lab 05/27/17 0308 05/28/17 0425  WBC 12.6* 15.0*  NEUTROABS 8.7*  --   HGB 13.3 10.9*  HCT 39.1 33.9*  MCV 89.3 90.9  PLT 271 017   Basic Metabolic Panel:  Recent Labs Lab 05/27/17 0457 05/27/17 1834 05/28/17 0425  NA 138  --  140  K 3.2*  --  4.3  CL 109  --  117*  CO2 17*  --  17*  GLUCOSE 230*  --  172*  BUN 15  --  12  CREATININE 1.18*  --  0.87  CALCIUM 7.9*  --  7.5*  MG  --  2.1  --    GFR: Estimated Creatinine Clearance: 48.4 mL/min (by C-G formula based on SCr of 0.87 mg/dL). Liver Function Tests:  Recent Labs Lab 05/27/17 0457 05/28/17 0425  AST 24 13*  ALT 12* 13*  ALKPHOS 49 47  BILITOT 0.5 0.6  PROT 6.4* 6.3*  ALBUMIN 3.2* 3.0*   No results for input(s): LIPASE, AMYLASE in the last 168 hours. No  results for input(s): AMMONIA in the last 168 hours. Coagulation Profile: No results for input(s): INR, PROTIME in the last 168 hours. Cardiac Enzymes: No results for input(s): CKTOTAL, CKMB, CKMBINDEX, TROPONINI in the last 168 hours. BNP (last 3 results) No results for input(s): PROBNP in the last 8760 hours. HbA1C: No results for input(s): HGBA1C in the last 72 hours. CBG:  Recent Labs Lab 05/27/17 1952 05/28/17 0010 05/28/17 0505 05/28/17 0750 05/28/17 1154  GLUCAP 200* 143* 154* 162* 184*   Lipid Profile: No results for input(s): CHOL, HDL, LDLCALC, TRIG, CHOLHDL, LDLDIRECT in the last 72 hours. Thyroid Function Tests: No results for input(s): TSH, T4TOTAL, FREET4, T3FREE,  THYROIDAB in the last 72 hours. Anemia Panel: No results for input(s): VITAMINB12, FOLATE, FERRITIN, TIBC, IRON, RETICCTPCT in the last 72 hours. Urine analysis:    Component Value Date/Time   COLORURINE STRAW (A) 05/27/2017 0627   APPEARANCEUR CLEAR 05/27/2017 0627   LABSPEC 1.011 05/27/2017 0627   PHURINE 6.0 05/27/2017 0627   GLUCOSEU >=500 (A) 05/27/2017 0627   HGBUR NEGATIVE 05/27/2017 0627   BILIRUBINUR NEGATIVE 05/27/2017 0627   KETONESUR NEGATIVE 05/27/2017 0627   PROTEINUR NEGATIVE 05/27/2017 0627   UROBILINOGEN 1.0 09/25/2012 1352   NITRITE NEGATIVE 05/27/2017 0627   LEUKOCYTESUR NEGATIVE 05/27/2017 0627   Sepsis Labs: @LABRCNTIP (procalcitonin:4,lacticidven:4)  ) Recent Results (from the past 240 hour(s))  Blood Culture (routine x 2)     Status: None (Preliminary result)   Collection Time: 05/27/17  3:40 AM  Result Value Ref Range Status   Specimen Description BLOOD RIGHT ANTECUBITAL  Final   Special Requests   Final    BOTTLES DRAWN AEROBIC AND ANAEROBIC Blood Culture adequate volume   Culture NO GROWTH 1 DAY  Final   Report Status PENDING  Incomplete  Blood Culture (routine x 2)     Status: None (Preliminary result)   Collection Time: 05/27/17  3:40 AM  Result Value Ref  Range Status   Specimen Description BLOOD LEFT HAND  Final   Special Requests   Final    BOTTLES DRAWN AEROBIC ONLY Blood Culture adequate volume   Culture NO GROWTH 1 DAY  Final   Report Status PENDING  Incomplete  Urine culture     Status: None   Collection Time: 05/27/17  6:27 AM  Result Value Ref Range Status   Specimen Description URINE, RANDOM  Final   Special Requests NONE  Final   Culture NO GROWTH  Final   Report Status 05/28/2017 FINAL  Final  Respiratory Panel by PCR     Status: Abnormal   Collection Time: 05/27/17 12:35 PM  Result Value Ref Range Status   Adenovirus NOT DETECTED NOT DETECTED Final   Coronavirus 229E NOT DETECTED NOT DETECTED Final   Coronavirus HKU1 NOT DETECTED NOT DETECTED Final   Coronavirus NL63 NOT DETECTED NOT DETECTED Final   Coronavirus OC43 NOT DETECTED NOT DETECTED Final   Metapneumovirus NOT DETECTED NOT DETECTED Final   Rhinovirus / Enterovirus DETECTED (A) NOT DETECTED Final   Influenza A NOT DETECTED NOT DETECTED Final   Influenza B NOT DETECTED NOT DETECTED Final   Parainfluenza Virus 1 NOT DETECTED NOT DETECTED Final   Parainfluenza Virus 2 NOT DETECTED NOT DETECTED Final   Parainfluenza Virus 3 NOT DETECTED NOT DETECTED Final   Parainfluenza Virus 4 NOT DETECTED NOT DETECTED Final   Respiratory Syncytial Virus NOT DETECTED NOT DETECTED Final   Bordetella pertussis NOT DETECTED NOT DETECTED Final   Chlamydophila pneumoniae NOT DETECTED NOT DETECTED Final   Mycoplasma pneumoniae NOT DETECTED NOT DETECTED Final  Culture, sputum-assessment     Status: None   Collection Time: 05/27/17  1:40 PM  Result Value Ref Range Status   Specimen Description EXPECTORATED SPUTUM  Final   Special Requests NONE  Final   Sputum evaluation   Final    Sputum specimen not acceptable for testing.  Please recollect.   Gram Stain Report Called to,Read Back By and Verified With: RN S.WOODY [id number] HDAY    Report Status 05/27/2017 FINAL  Final  MRSA PCR  Screening     Status: None   Collection Time: 05/27/17  4:55 PM  Result Value Ref Range Status   MRSA by PCR NEGATIVE NEGATIVE Final    Comment:        The GeneXpert MRSA Assay (FDA approved for NASAL specimens only), is one component of a comprehensive MRSA colonization surveillance program. It is not intended to diagnose MRSA infection nor to guide or monitor treatment for MRSA infections.          Radiology Studies: Dg Chest 2 View  Result Date: 05/28/2017 CLINICAL DATA:  Sepsis. EXAM: CHEST  2 VIEW COMPARISON:  05/27/2017 . FINDINGS: Mediastinum and hilar structures normal. Cardiomegaly mild pulmonary vascular prominence and interstitial prominence. Mild CHF cannot be excluded. Mild pneumonitis cannot be excluded. Small pleural effusions cannot be excluded . Pneumothorax. Prior cervical spine fusion. IMPRESSION: Cardiomegaly with mild pulmonary vascular prominence and interstitial prominence. Small pleural effusions cannot be excluded . Mild CHF cannot be excluded. Mild pneumonitis cannot be excluded. Electronically Signed   By: Marcello Moores  Register   On: 05/28/2017 09:02   Dg Chest Port 1 View  Result Date: 05/27/2017 CLINICAL DATA:  Fever and cough EXAM: PORTABLE CHEST 1 VIEW COMPARISON:  06/26/2017 FINDINGS: Post surgical hardware in the cervical spine. Low lung volumes. Patchy atelectasis versus small infiltrate at the left base. Stable cardiomediastinal silhouette with atherosclerosis. No pneumothorax. IMPRESSION: Low lung volumes with patchy atelectasis versus minimal infiltrate at the left base Electronically Signed   By: Donavan Foil M.D.   On: 05/27/2017 03:34        Scheduled Meds: . budesonide (PULMICORT) nebulizer solution  0.25 mg Nebulization BID  . enoxaparin (LOVENOX) injection  40 mg Subcutaneous Q24H  . glimepiride  0.5 mg Oral Daily  . insulin aspart  0-15 Units Subcutaneous Q4H  . ipratropium-albuterol  3 mL Nebulization Q6H  . levothyroxine  75 mcg Oral  QAC breakfast  . linagliptin  5 mg Oral QPM  . loratadine  10 mg Oral Daily  . methylPREDNISolone (SOLU-MEDROL) injection  60 mg Intravenous Q12H  . pantoprazole  40 mg Oral Daily  . simvastatin  20 mg Oral q1800   Continuous Infusions: . sodium chloride 1,000 mL (05/28/17 0625)  . azithromycin Stopped (05/28/17 1150)  . cefTRIAXone (ROCEPHIN)  IV Stopped (05/28/17 0830)     LOS: 1 day    Time spent: 40 minutes    Tenecia Ignasiak, Geraldo Docker, MD [facility] Pager [phone]   If 7PM-7AM, please contact night-coverage [email or web address] Password [facility] 05/28/2017, 12:23 PM

## 2017-05-28 NOTE — Progress Notes (Signed)
Inpatient Diabetes Program Recommendations  AACE/ADA: New Consensus Statement on Inpatient Glycemic Control (2015)  Target Ranges:  Prepandial:   less than 140 mg/dL      Peak postprandial:   less than 180 mg/dL (1-2 hours)      Critically ill patients:  140 - 180 mg/dL   Lab Results  Component Value Date   GLUCAP 162 (H) 05/28/2017   HGBA1C 7.8 (H) 06/27/2015    Review of Glycemic Control Results for RONI, FRIBERG (MRN [id number]) as of 05/28/2017 11:27  Ref. Range 05/27/2017 17:36 05/27/2017 19:52 05/28/2017 00:10 05/28/2017 05:05 05/28/2017 07:50  Glucose-Capillary Latest Ref Range: 65 - 99 mg/dL 239 (H) 200 (H) 143 (H) 154 (H) 162 (H)   Diabetes history: DM2 Outpatient Diabetes medications: Amaryl 1 mg + Tradjenta 5 mg + Jardiance 10 Current orders for Inpatient glycemic control: Amaryl 0.5 mg + Tradjenta 5 mg + Novolog correction 0-15 q 4 hrs.  Inpatient Diabetes Program Recommendations:    -A1c to determine prehospital glycemic control -Decrease Novolog correction to 0-9 units tid + 0-5 units hs -Consider D/C oral DM medications while in the hospital and transition to SQ insulin  Will follow.  Thank you, Nani Gasser. Genia Perin, RN, MSN, CDE  Diabetes Coordinator Inpatient Glycemic Control Team Team Pager [phone] (8am-5pm) 05/28/2017 11:45 AM

## 2017-05-28 NOTE — Plan of Care (Signed)
Problem: Fluid Volume: Goal: Hemodynamic stability will improve Outcome: Progressing Patient Lactic Acid level has returned to normal post multiple bolus and continuous infusions

## 2017-05-28 NOTE — Evaluation (Addendum)
Physical Therapy Evaluation/Discharge Patient Details Name: Amber Gray MRN: [id number] DOB: 12/19/1940 Today's Date: 05/28/2017   History of Present Illness  Pt adm with sepsis due to PNA. PMH - DM, HTN, neck surgery  Clinical Impression  Pt doing well with mobility and no further PT needed.  Can benefit from rollator at home.      Follow Up Recommendations No PT follow up    Equipment Recommendations  Other (comment) (rollator)    Recommendations for Other Services       Precautions / Restrictions Precautions Precautions: None Restrictions Weight Bearing Restrictions: No      Mobility  Bed Mobility Overal bed mobility: Modified Independent                Transfers Overall transfer level: Modified independent Equipment used: None                Ambulation/Gait Ambulation/Gait assistance: Modified independent (Device/Increase time) Ambulation Distance (Feet): 170 Feet Assistive device: 4-wheeled walker Gait Pattern/deviations: Step-through pattern;Decreased stride length Gait velocity: decr Gait velocity interpretation: Below normal speed for age/gender General Gait Details: Steady gait with rollator. Pt demonstrated good use rollator  Stairs            Wheelchair Mobility    Modified Rankin (Stroke Patients Only)       Balance Overall balance assessment: Needs assistance Sitting-balance support: No upper extremity supported;Feet supported Sitting balance-Leahy Scale: Normal     Standing balance support: No upper extremity supported;During functional activity Standing balance-Leahy Scale: Good                               Pertinent Vitals/Pain Pain Assessment: No/denies pain    Home Living Family/patient expects to be discharged to:: Private residence Living Arrangements: Alone   Type of Home: Apartment Home Access: Level entry     Home Layout: One level Home Equipment: Environmental consultant - standard       Prior Function Level of Independence: Independent with assistive device(s)         Comments: Pt has been using standard walker     Hand Dominance        Extremity/Trunk Assessment   Upper Extremity Assessment Upper Extremity Assessment: Overall WFL for tasks assessed    Lower Extremity Assessment Lower Extremity Assessment: Overall WFL for tasks assessed       Communication   Communication: No difficulties  Cognition Arousal/Alertness: Awake/alert Behavior During Therapy: WFL for tasks assessed/performed Overall Cognitive Status: Within Functional Limits for tasks assessed                                        General Comments      Exercises     Assessment/Plan    PT Assessment Patent does not need any further PT services  PT Problem List         PT Treatment Interventions      PT Goals (Current goals can be found in the Care Plan section)  Acute Rehab PT Goals PT Goal Formulation: All assessment and education complete, DC therapy    Frequency     Barriers to discharge        Co-evaluation               AM-PAC PT "6 Clicks" Daily Activity  Outcome Measure Difficulty turning over in  bed (including adjusting bedclothes, sheets and blankets)?: None Difficulty moving from lying on back to sitting on the side of the bed? : None Difficulty sitting down on and standing up from a chair with arms (e.g., wheelchair, bedside commode, etc,.)?: None Help needed moving to and from a bed to chair (including a wheelchair)?: None Help needed walking in hospital room?: None Help needed climbing 3-5 steps with a railing? : A Little 6 Click Score: 23    End of Session   Activity Tolerance: Patient tolerated treatment well Patient left: in chair;with call bell/phone within reach Nurse Communication: Mobility status PT Visit Diagnosis: Unsteadiness on feet (R26.81)    Time: 1040-1057 PT Time Calculation (min) (ACUTE ONLY): 17  min   Charges:   PT Evaluation $PT Eval Low Complexity: 1 Low     PT G CodesMarland Kitchen        [facility] [place] 05/28/2017, 12:35 PM

## 2017-05-29 LAB — GLUCOSE, CAPILLARY
GLUCOSE-CAPILLARY: 131 mg/dL — AB (ref 65–99)
GLUCOSE-CAPILLARY: 132 mg/dL — AB (ref 65–99)
GLUCOSE-CAPILLARY: 135 mg/dL — AB (ref 65–99)
GLUCOSE-CAPILLARY: 165 mg/dL — AB (ref 65–99)
GLUCOSE-CAPILLARY: 239 mg/dL — AB (ref 65–99)
Glucose-Capillary: 205 mg/dL — ABNORMAL HIGH (ref 65–99)

## 2017-05-29 LAB — PROCALCITONIN

## 2017-05-29 MED ORDER — AZITHROMYCIN 500 MG PO TABS
500.0000 mg | ORAL_TABLET | Freq: Every day | ORAL | Status: DC
  Administered 2017-05-29 – 2017-05-30 (×2): 500 mg via ORAL
  Filled 2017-05-29 (×2): qty 1

## 2017-05-29 MED ORDER — SODIUM CHLORIDE 0.9 % IV SOLN
1.0000 g | Freq: Once | INTRAVENOUS | Status: AC
  Administered 2017-05-29: 1 g via INTRAVENOUS
  Filled 2017-05-29: qty 10

## 2017-05-29 MED ORDER — CEFIXIME 400 MG PO CAPS: 400.0000 mg | ORAL_CAPSULE | Freq: Every day | ORAL | Status: DC

## 2017-05-29 MED ORDER — INSULIN GLARGINE 100 UNIT/ML ~~LOC~~ SOLN
5.0000 [IU] | Freq: Every day | SUBCUTANEOUS | Status: DC
  Administered 2017-05-29 – 2017-05-30 (×2): 5 [IU] via SUBCUTANEOUS
  Filled 2017-05-29 (×3): qty 0.05

## 2017-05-29 MED ORDER — ORAL CARE MOUTH RINSE
15.0000 mL | Freq: Two times a day (BID) | OROMUCOSAL | Status: DC
  Administered 2017-05-30 – 2017-05-31 (×2): 15 mL via OROMUCOSAL

## 2017-05-29 MED ORDER — CEFIXIME 400 MG PO CAPS
400.0000 mg | ORAL_CAPSULE | Freq: Every day | ORAL | Status: DC
  Administered 2017-05-29 – 2017-05-31 (×3): 400 mg via ORAL
  Filled 2017-05-29 (×3): qty 1

## 2017-05-29 NOTE — Progress Notes (Signed)
PROGRESS NOTE    Amber Gray  HLK:[id number] DOB: 1941/09/05 DOA: 05/27/2017 PCP: [name], MD   Brief Narrative:  76 y.o. WF PMHx Hypothyroidism, HTN, Dyslipidemia, CKD stage III, former Tobacco abuse Diabetes Type 2  uncontrolled with renal complication on PO agents.   Presents after experiencing 2 days of productive cough with yellow sputum, fever and chills. Positive pleuritic chest discomfort with inspiration and coughing. No known sick contacts. In the ER she was found to have a left lower lobe pneumonia. She had positive sepsis physiology with a white count of 12,600 with a lactic acid of 2.29, mild acute kidney injury and hyperglycemia as well as decreased CO2 17. She was given 3 L of IV fluids and started on empiric broad-spectrum antibiotics by the EDP but second lactic acid increased at 5.38 and therefore she will be admitted to the stepdown unit for further evaluation and treatment of sepsis secondary to pneumonia.    Subjective: 8/29 A/O 4, negative CP, positive DOE, negative SOB, positive wheezing, negative abdominal pain, negative N/V     Assessment & Plan:   Principal Problem:   Sepsis due to pneumonia ([place]) Active Problems:   CKD (chronic kidney disease), stage III   Diabetes mellitus type 2, uncontrolled ([place])   Hypothyroid   Hypercholesteremia   Acute hypokalemia   HTN (hypertension)   Sepsis due to pneumonia/ positive rhinovirus/enterovirus/CAP -Presents with fever, chills, productive cough with findings of left-sided pneumonia on chest x-ray -Complete seven-day course antibiotics. Transitioned over to PO  -DuoNeb QID -Medrol 60 mg BID  Acute respiratory failure with hypoxia -Today patient became extremely dyspneic when ambulating around ward. Dropped SPO2 into the 80s had to be placed back on O2. Received multiple bronchodilator treatments. Not appropriate for discharge. -Titrate O2 to maintain SPO2> 93%. Currently on 2 L O2 -Patient lives  alone at home prior to discharge reevaluate for SNF placement   Diabetes mellitus type 2, uncontrolled with renal complication -7/34 start Lantus 5 units daily -Moderate SSI -Low-dose Amaryl and Tradjenta  -Holding Jardiance   CKD (chronic kidney disease), stage III -Resolved -Continue to trend     Acute hypokalemia -Resolved      Essential HTN (hypertension) -Hold preadmission ACE inhibitor and Lasix     Hypothyroid -Synthroid 75 g daily     Hypercholesteremia -Zocor 20 mg daily  Hypocalcemia -Corrected calcium= 8.5 -Calcium gluconate 1 g   DVT prophylaxis: Lovenox Code Status: DO NOT RESUSCITATE Family Communication: None Disposition Plan: 24 -48 hours   Consultants:  None   Procedures/Significant Events:  None   I have personally reviewed and interpreted all radiology studies and my findings are as above.  VENTILATOR SETTINGS: None   Cultures 8/27 blood NGTD 8/27 urine negative 8/27 sputum not acceptable 8/27 MRSA by PCR negative 8/27 HIV negative 8/27 respiratory virus panel positive rhinovirus/enterovirus     Antimicrobials:Anti-infectives    Start     Stop   05/29/17 2000  azithromycin (ZITHROMAX) tablet 500 mg     06/03/17 2359   05/29/17 1915  Cefixime (SUPRAX) capsule 400 mg  Status:  Discontinued     05/29/17 1901   05/29/17 1915  Cefixime (SUPRAX) capsule 400 mg     06/03/17 2359   05/27/17 1000  azithromycin (ZITHROMAX) 500 mg in dextrose 5 % 250 mL IVPB  Status:  Discontinued     05/29/17 1900   05/27/17 0800  cefTRIAXone (ROCEPHIN) 1 g in dextrose 5 % 50 mL IVPB  Status:  Discontinued     05/29/17 1900   05/27/17 0330  piperacillin-tazobactam (ZOSYN) IVPB 3.375 g     05/27/17 0438   05/27/17 0330  vancomycin (VANCOCIN) IVPB 1000 mg/200 mL premix     05/27/17 0508        Devices    LINES / TUBES:      Continuous Infusions: . sodium chloride 1,000 mL (05/29/17 1131)  . azithromycin 500 mg (05/29/17 1132)  .  cefTRIAXone (ROCEPHIN)  IV Stopped (05/29/17 1055)     Objective: Vitals:   05/29/17 0926 05/29/17 0952 05/29/17 1213 05/29/17 1220  BP:    127/77  Pulse:    85  Resp:    15  Temp:    (!) 96.9 F (36.1 C)  TempSrc:    Axillary  SpO2: 92% 94% 94% 95%  Weight:      Height:        Intake/Output Summary (Last 24 hours) at 05/29/17 1506 Last data filed at 05/29/17 0400  Gross per 24 hour  Intake             2250 ml  Output                0 ml  Net             2250 ml   Filed Weights   05/27/17 0330 05/28/17 0455 05/29/17 0411  Weight: 144 lb (65.3 kg) 156 lb 12 oz (71.1 kg) 161 lb 6 oz (73.2 kg)    Examination:  General: A/O 4, positive acute respiratory distress  Neck:  Negative scars, masses, torticollis, lymphadenopathy, JVD  Lungs: tachypnea diffuse moderate to severe expiratory wheeze, negative crackles   Cardiovascular: Regular rhythm and rate, negative murmurs rubs or gallops, normal S1 and S2  Abdomen: Negative abdominal pain, nondistended, positive soft bowel sounds, no rebound, no ascites, no appreciable mass  Extremities: No significant cyanosis, clubbing, or edema  Skin: Negative rashes, lesions, ulcers Psychiatric:  Negative depression, negative anxiety, negative fatigue, negative mania  Central nervous system:  Cranial nerves II through XII intact, tongue/full midline, all extremity muscle strength 5/5, sensation intact throughout, negative dysarthria, negative expressive aphasia, negative receptive aphasia .  Marland Kitchen     Data Reviewed: Care during the described time interval was provided by me .  I have reviewed this patient's available data, including medical history, events of note, physical examination, and all test results as part of my evaluation.   CBC:  Recent Labs Lab 05/27/17 0308 05/28/17 0425  WBC 12.6* 15.0*  NEUTROABS 8.7*  --   HGB 13.3 10.9*  HCT 39.1 33.9*  MCV 89.3 90.9  PLT 271 269   Basic Metabolic Panel:  Recent Labs Lab  05/27/17 0457 05/27/17 1834 05/28/17 0425  NA 138  --  140  K 3.2*  --  4.3  CL 109  --  117*  CO2 17*  --  17*  GLUCOSE 230*  --  172*  BUN 15  --  12  CREATININE 1.18*  --  0.87  CALCIUM 7.9*  --  7.5*  MG  --  2.1  --    GFR: Estimated Creatinine Clearance: 48.4 mL/min (by C-G formula based on SCr of 0.87 mg/dL). Liver Function Tests:  Recent Labs Lab 05/27/17 0457 05/28/17 0425  AST 24 13*  ALT 12* 13*  ALKPHOS 49 47  BILITOT 0.5 0.6  PROT 6.4* 6.3*  ALBUMIN 3.2* 3.0*   No results for input(s): LIPASE, AMYLASE in the  last 168 hours. No results for input(s): AMMONIA in the last 168 hours. Coagulation Profile: No results for input(s): INR, PROTIME in the last 168 hours. Cardiac Enzymes: No results for input(s): CKTOTAL, CKMB, CKMBINDEX, TROPONINI in the last 168 hours. BNP (last 3 results) No results for input(s): PROBNP in the last 8760 hours. HbA1C: No results for input(s): HGBA1C in the last 72 hours. CBG:  Recent Labs Lab 05/28/17 2018 05/29/17 0006 05/29/17 0408 05/29/17 0900 05/29/17 1219  GLUCAP 185* 131* 165* 132* 239*   Lipid Profile: No results for input(s): CHOL, HDL, LDLCALC, TRIG, CHOLHDL, LDLDIRECT in the last 72 hours. Thyroid Function Tests: No results for input(s): TSH, T4TOTAL, FREET4, T3FREE, THYROIDAB in the last 72 hours. Anemia Panel: No results for input(s): VITAMINB12, FOLATE, FERRITIN, TIBC, IRON, RETICCTPCT in the last 72 hours. Urine analysis:    Component Value Date/Time   COLORURINE STRAW (A) 05/27/2017 0627   APPEARANCEUR CLEAR 05/27/2017 0627   LABSPEC 1.011 05/27/2017 0627   PHURINE 6.0 05/27/2017 0627   GLUCOSEU >=500 (A) 05/27/2017 0627   HGBUR NEGATIVE 05/27/2017 0627   BILIRUBINUR NEGATIVE 05/27/2017 0627   KETONESUR NEGATIVE 05/27/2017 0627   PROTEINUR NEGATIVE 05/27/2017 0627   UROBILINOGEN 1.0 09/25/2012 1352   NITRITE NEGATIVE 05/27/2017 0627   LEUKOCYTESUR NEGATIVE 05/27/2017 0627   Sepsis  Labs: @LABRCNTIP (procalcitonin:4,lacticidven:4)  ) Recent Results (from the past 240 hour(s))  Blood Culture (routine x 2)     Status: None (Preliminary result)   Collection Time: 05/27/17  3:40 AM  Result Value Ref Range Status   Specimen Description BLOOD RIGHT ANTECUBITAL  Final   Special Requests   Final    BOTTLES DRAWN AEROBIC AND ANAEROBIC Blood Culture adequate volume   Culture NO GROWTH 2 DAYS  Final   Report Status PENDING  Incomplete  Blood Culture (routine x 2)     Status: None (Preliminary result)   Collection Time: 05/27/17  3:40 AM  Result Value Ref Range Status   Specimen Description BLOOD LEFT HAND  Final   Special Requests   Final    BOTTLES DRAWN AEROBIC ONLY Blood Culture adequate volume   Culture NO GROWTH 2 DAYS  Final   Report Status PENDING  Incomplete  Urine culture     Status: None   Collection Time: 05/27/17  6:27 AM  Result Value Ref Range Status   Specimen Description URINE, RANDOM  Final   Special Requests NONE  Final   Culture NO GROWTH  Final   Report Status 05/28/2017 FINAL  Final  Respiratory Panel by PCR     Status: Abnormal   Collection Time: 05/27/17 12:35 PM  Result Value Ref Range Status   Adenovirus NOT DETECTED NOT DETECTED Final   Coronavirus 229E NOT DETECTED NOT DETECTED Final   Coronavirus HKU1 NOT DETECTED NOT DETECTED Final   Coronavirus NL63 NOT DETECTED NOT DETECTED Final   Coronavirus OC43 NOT DETECTED NOT DETECTED Final   Metapneumovirus NOT DETECTED NOT DETECTED Final   Rhinovirus / Enterovirus DETECTED (A) NOT DETECTED Final   Influenza A NOT DETECTED NOT DETECTED Final   Influenza B NOT DETECTED NOT DETECTED Final   Parainfluenza Virus 1 NOT DETECTED NOT DETECTED Final   Parainfluenza Virus 2 NOT DETECTED NOT DETECTED Final   Parainfluenza Virus 3 NOT DETECTED NOT DETECTED Final   Parainfluenza Virus 4 NOT DETECTED NOT DETECTED Final   Respiratory Syncytial Virus NOT DETECTED NOT DETECTED Final   Bordetella pertussis  NOT DETECTED NOT DETECTED Final  Chlamydophila pneumoniae NOT DETECTED NOT DETECTED Final   Mycoplasma pneumoniae NOT DETECTED NOT DETECTED Final  Culture, sputum-assessment     Status: None   Collection Time: 05/27/17  1:40 PM  Result Value Ref Range Status   Specimen Description EXPECTORATED SPUTUM  Final   Special Requests NONE  Final   Sputum evaluation   Final    Sputum specimen not acceptable for testing.  Please recollect.   Gram Stain Report Called to,Read Back By and Verified With: RN S.WOODY [phone] HDAY    Report Status 05/27/2017 FINAL  Final  MRSA PCR Screening     Status: None   Collection Time: 05/27/17  4:55 PM  Result Value Ref Range Status   MRSA by PCR NEGATIVE NEGATIVE Final    Comment:        The GeneXpert MRSA Assay (FDA approved for NASAL specimens only), is one component of a comprehensive MRSA colonization surveillance program. It is not intended to diagnose MRSA infection nor to guide or monitor treatment for MRSA infections.          Radiology Studies: Dg Chest 2 View  Result Date: 05/28/2017 CLINICAL DATA:  Sepsis. EXAM: CHEST  2 VIEW COMPARISON:  05/27/2017 . FINDINGS: Mediastinum and hilar structures normal. Cardiomegaly mild pulmonary vascular prominence and interstitial prominence. Mild CHF cannot be excluded. Mild pneumonitis cannot be excluded. Small pleural effusions cannot be excluded . Pneumothorax. Prior cervical spine fusion. IMPRESSION: Cardiomegaly with mild pulmonary vascular prominence and interstitial prominence. Small pleural effusions cannot be excluded . Mild CHF cannot be excluded. Mild pneumonitis cannot be excluded. Electronically Signed   By: Marcello Moores  Register   On: 05/28/2017 09:02        Scheduled Meds: . budesonide (PULMICORT) nebulizer solution  0.25 mg Nebulization BID  . enoxaparin (LOVENOX) injection  40 mg Subcutaneous Q24H  . glimepiride  0.5 mg Oral Daily  . insulin aspart  0-15 Units Subcutaneous Q4H  .  ipratropium-albuterol  3 mL Nebulization QID  . levothyroxine  75 mcg Oral QAC breakfast  . linagliptin  5 mg Oral QPM  . loratadine  10 mg Oral Daily  . methylPREDNISolone (SOLU-MEDROL) injection  60 mg Intravenous Q12H  . pantoprazole  40 mg Oral Daily  . simvastatin  20 mg Oral q1800   Continuous Infusions: . sodium chloride 1,000 mL (05/29/17 1131)  . azithromycin 500 mg (05/29/17 1132)  . cefTRIAXone (ROCEPHIN)  IV Stopped (05/29/17 1055)     LOS: 2 days    Time spent: 40 minutes    WOODS, Geraldo Docker, MD [facility] Pager [phone]   If 7PM-7AM, please contact night-coverage [email or web address] Password [email or web address] 05/29/2017, 3:06 PM

## 2017-05-29 NOTE — Progress Notes (Signed)
Chaplain presented to the patient to provide spiritual care support. Offered ministry of presence as the patient shared her need for prayer for healing for both physical and emotional needs.. She was very anxious and worried about other family members, Chaplain encouraged her to focus on  her own needs.  Chaplain will continue to follow up. Chaplain Yaakov Guthrie [phone]

## 2017-05-30 ENCOUNTER — Inpatient Hospital Stay (HOSPITAL_COMMUNITY): Payer: Medicare HMO

## 2017-05-30 DIAGNOSIS — A419 Sepsis, unspecified organism: Principal | ICD-10-CM

## 2017-05-30 DIAGNOSIS — J189 Pneumonia, unspecified organism: Secondary | ICD-10-CM

## 2017-05-30 LAB — CBC WITH DIFFERENTIAL/PLATELET
BASOS ABS: 0 10*3/uL (ref 0.0–0.1)
BASOS PCT: 0 %
EOS PCT: 0 %
Eosinophils Absolute: 0 10*3/uL (ref 0.0–0.7)
HCT: 37.1 % (ref 36.0–46.0)
Hemoglobin: 11.3 g/dL — ABNORMAL LOW (ref 12.0–15.0)
LYMPHS PCT: 6 %
Lymphs Abs: 0.9 10*3/uL (ref 0.7–4.0)
MCH: 28.5 pg (ref 26.0–34.0)
MCHC: 30.5 g/dL (ref 30.0–36.0)
MCV: 93.5 fL (ref 78.0–100.0)
MONO ABS: 0.5 10*3/uL (ref 0.1–1.0)
Monocytes Relative: 4 %
Neutro Abs: 13.7 10*3/uL — ABNORMAL HIGH (ref 1.7–7.7)
Neutrophils Relative %: 90 %
PLATELETS: 206 10*3/uL (ref 150–400)
RBC: 3.97 MIL/uL (ref 3.87–5.11)
RDW: 14.6 % (ref 11.5–15.5)
WBC: 15.1 10*3/uL — AB (ref 4.0–10.5)

## 2017-05-30 LAB — BLOOD GAS, ARTERIAL
ACID-BASE DEFICIT: 7.6 mmol/L — AB (ref 0.0–2.0)
BICARBONATE: 17.1 mmol/L — AB (ref 20.0–28.0)
DRAWN BY: 511911
O2 CONTENT: 2 L/min
O2 SAT: 93 %
PATIENT TEMPERATURE: 97.7
PH ART: 7.339 — AB (ref 7.350–7.450)
pCO2 arterial: 32.5 mmHg (ref 32.0–48.0)
pO2, Arterial: 63.9 mmHg — ABNORMAL LOW (ref 83.0–108.0)

## 2017-05-30 LAB — BASIC METABOLIC PANEL
Anion gap: 6 (ref 5–15)
BUN: 20 mg/dL (ref 6–20)
CHLORIDE: 119 mmol/L — AB (ref 101–111)
CO2: 17 mmol/L — ABNORMAL LOW (ref 22–32)
CREATININE: 0.99 mg/dL (ref 0.44–1.00)
Calcium: 8.3 mg/dL — ABNORMAL LOW (ref 8.9–10.3)
GFR, EST NON AFRICAN AMERICAN: 54 mL/min — AB (ref >60)
Glucose, Bld: 119 mg/dL — ABNORMAL HIGH (ref 65–99)
POTASSIUM: 4.2 mmol/L (ref 3.5–5.1)
SODIUM: 142 mmol/L (ref 135–145)

## 2017-05-30 LAB — GLUCOSE, CAPILLARY
GLUCOSE-CAPILLARY: 113 mg/dL — AB (ref 65–99)
GLUCOSE-CAPILLARY: 141 mg/dL — AB (ref 65–99)
GLUCOSE-CAPILLARY: 166 mg/dL — AB (ref 65–99)
GLUCOSE-CAPILLARY: 199 mg/dL — AB (ref 65–99)
Glucose-Capillary: 187 mg/dL — ABNORMAL HIGH (ref 65–99)
Glucose-Capillary: 214 mg/dL — ABNORMAL HIGH (ref 65–99)

## 2017-05-30 LAB — MAGNESIUM: MAGNESIUM: 2.4 mg/dL (ref 1.7–2.4)

## 2017-05-30 MED ORDER — FUROSEMIDE 40 MG PO TABS
40.0000 mg | ORAL_TABLET | Freq: Every day | ORAL | Status: DC
  Administered 2017-05-30 – 2017-05-31 (×2): 40 mg via ORAL
  Filled 2017-05-30 (×2): qty 1

## 2017-05-30 NOTE — Care Management Obs Status (Deleted)
[facility] NOTIFICATION   Patient Details  Name: Amber Gray MRN: [id number] Date of Birth: September 12, 1941   Medicare Observation Status Notification Given:  Yes    Emillee Talsma, Rory Percy, RN 05/30/2017, 11:37 AM

## 2017-05-30 NOTE — Progress Notes (Signed)
Notified [name] RRT that patient was ready for her breathing treatment at this time.

## 2017-05-30 NOTE — Care Management Note (Signed)
Case Management Note  Patient Details  Name: Amber Gray MRN: [id number] Date of Birth: Jul 22, 1941  Subjective/Objective:     CM following for progression and d/c planning.                Action/Plan: 05/30/2017 Met with pt to discuss d/c planning. Pt lives in independent living at [facility] and is independent of ADL. This apartment complex is primarily senior citizens and does not offer assisted living. Pt states that she has used [facility] in the past and would like to use that agency again upon d/c . [place] notified and will follow for d/c needs.    Expected Discharge Date:                  Expected Discharge Plan:  [place]  In-House Referral:  NA  Discharge planning Services  CM Consult  Post Acute Care Choice:  Home Health Choice offered to:  Patient  DME Arranged:    DME Agency:     HH Arranged:  RN, PT, Nurse's Aide [place] Agency:  [place]  Status of Service:  In process, will continue to follow  If discussed at Long Length of Stay Meetings, dates discussed:    Additional Comments:  Adron Bene, RN 05/30/2017, 11:38 AM

## 2017-05-30 NOTE — Progress Notes (Signed)
PROGRESS NOTE    Amber Gray  JIR:[id number] DOB: 1941-03-21 DOA: 05/27/2017 PCP: [name], MD   Brief Narrative:  76 y.o. WF PMHx Hypothyroidism, HTN, Dyslipidemia, CKD stage III, former Tobacco abuse Diabetes Type 2  uncontrolled with renal complication on PO agents.   Presents after experiencing 2 days of productive cough with yellow sputum, fever and chills. Positive pleuritic chest discomfort with inspiration and coughing. No known sick contacts. In the ER she was found to have a left lower lobe pneumonia. She had positive sepsis physiology with a white count of 12,600 with a lactic acid of 2.29, mild acute kidney injury and hyperglycemia as well as decreased CO2 17. She was given 3 L of IV fluids and started on empiric broad-spectrum antibiotics by the EDP but second lactic acid increased at 5.38 and therefore she will be admitted to the stepdown unit for further evaluation and treatment of sepsis secondary to pneumonia.    Subjective:  Awake alert anxious Talkative tangential No cp--verbose.  No sob while talking   Assessment & Plan:   Principal Problem:   Sepsis due to pneumonia [facility]) Active Problems:   CKD (chronic kidney disease), stage III   Diabetes mellitus type 2, uncontrolled (HCC)   Hypothyroid   Hypercholesteremia   Acute hypokalemia   HTN (hypertension)   Sepsis due to pneumonia/ positive rhinovirus/enterovirus/CAP -Presents with fever, chills, productive cough with findings of left-sided pneumonia on chest x-ray -Complete seven-day course antibiotics. Transitioned over to PO suprax 400 od, azithro 500 daily--stop date  9/1 -DuoNeb QID -Medrol 60 mg BID  Acute respiratory failure with hypoxia -dyspneic on ambulation earlier-attempt desat screen again with walking -Titrate O2 to maintain SPO2> 93%. Currently on 2 L O2 -Patient lives alone at home prior to discharge reevaluate for SNF placement   Diabetes mellitus type 2, uncontrolled with renal  complication -7/51 start Lantus 5 units daily -Moderate SSI   --166-199 -Low-dose Amaryl 0.5mg  and Tradjenta 5 mg -Holding Jardiance   CKD (chronic kidney disease), stage III -Resolved -Continue to trend     Acute hypokalemia -Resolved      Essential HTN (hypertension) -Hold preadmission ACE inhibitor and Lasix     Hypothyroid -Synthroid 75 g daily     Hypercholesteremia -Zocor 20 mg daily  Hypocalcemia -Corrected calcium= 8.5 -Calcium gluconate 1 g   DVT prophylaxis: Lovenox Code Status: DO NOT RESUSCITATE Family Communication: None present today Disposition Plan: not ready for d/c--on IV steroids Need desat screen will attempt to transition to PO steoirds if sounds clear in am   Consultants:  None   Procedures/Significant Events:  None   I have personally reviewed and interpreted all radiology studies and my findings are as above.  VENTILATOR SETTINGS: None   Cultures 8/27 blood NGTD 8/27 urine negative 8/27 sputum not acceptable 8/27 MRSA by PCR negative 8/27 HIV negative 8/27 respiratory virus panel positive rhinovirus/enterovirus     Antimicrobials:Anti-infectives    Start     Stop   05/29/17 2000  azithromycin (ZITHROMAX) tablet 500 mg     06/03/17 2359   05/29/17 1915  Cefixime (SUPRAX) capsule 400 mg  Status:  Discontinued     05/29/17 1901   05/29/17 1915  Cefixime (SUPRAX) capsule 400 mg     06/03/17 2359   05/27/17 1000  azithromycin (ZITHROMAX) 500 mg in dextrose 5 % 250 mL IVPB  Status:  Discontinued     05/29/17 1900   05/27/17 0800  cefTRIAXone (ROCEPHIN) 1 g in dextrose  5 % 50 mL IVPB  Status:  Discontinued     05/29/17 1900   05/27/17 0330  piperacillin-tazobactam (ZOSYN) IVPB 3.375 g     05/27/17 0438   05/27/17 0330  vancomycin (VANCOCIN) IVPB 1000 mg/200 mL premix     05/27/17 0508        Devices    LINES / TUBES:      Continuous Infusions:    Objective: Vitals:   05/30/17 0900 05/30/17 0930 05/30/17  1400 05/30/17 1510  BP: 103/73  (!) 161/88   Pulse: 77  83   Resp: 20  (!) 24   Temp: 98 F (36.7 C)  98.2 F (36.8 C)   TempSrc:   Oral   SpO2: 96% 97% 98% 98%  Weight:      Height:        Intake/Output Summary (Last 24 hours) at 05/30/17 1558 Last data filed at 05/30/17 1500  Gross per 24 hour  Intake             2090 ml  Output             1176 ml  Net              914 ml   Filed Weights   05/27/17 0330 05/28/17 0455 05/29/17 0411  Weight: 65.3 kg (144 lb) 71.1 kg (156 lb 12 oz) 73.2 kg (161 lb 6 oz)    Examination:  Obese pleasant mallampatti 3 No jvd No bruit abd soft nt nd  Chest wheezy with crackles grd 2 LE edema anxious   Data Reviewed: Care during the described time interval was provided by me .  I have reviewed this patient's available data, including medical history, events of note, physical examination, and all test results as part of my evaluation.   CBC:  Recent Labs Lab 05/27/17 0308 05/28/17 0425 05/30/17 0415  WBC 12.6* 15.0* 15.1*  NEUTROABS 8.7*  --  13.7*  HGB 13.3 10.9* 11.3*  HCT 39.1 33.9* 37.1  MCV 89.3 90.9 93.5  PLT 271 173 160   Basic Metabolic Panel:  Recent Labs Lab 05/27/17 0457 05/27/17 1834 05/28/17 0425 05/30/17 0415  NA 138  --  140 142  K 3.2*  --  4.3 4.2  CL 109  --  117* 119*  CO2 17*  --  17* 17*  GLUCOSE 230*  --  172* 119*  BUN 15  --  12 20  CREATININE 1.18*  --  0.87 0.99  CALCIUM 7.9*  --  7.5* 8.3*  MG  --  2.1  --  2.4   GFR: Estimated Creatinine Clearance: 42.5 mL/min (by C-G formula based on SCr of 0.99 mg/dL). Liver Function Tests:  Recent Labs Lab 05/27/17 0457 05/28/17 0425  AST 24 13*  ALT 12* 13*  ALKPHOS 49 47  BILITOT 0.5 0.6  PROT 6.4* 6.3*  ALBUMIN 3.2* 3.0*   No results for input(s): LIPASE, AMYLASE in the last 168 hours. No results for input(s): AMMONIA in the last 168 hours. Coagulation Profile: No results for input(s): INR, PROTIME in the last 168 hours. Cardiac  Enzymes: No results for input(s): CKTOTAL, CKMB, CKMBINDEX, TROPONINI in the last 168 hours. BNP (last 3 results) No results for input(s): PROBNP in the last 8760 hours. HbA1C: No results for input(s): HGBA1C in the last 72 hours. CBG:  Recent Labs Lab 05/29/17 1513 05/30/17 0000 05/30/17 0350 05/30/17 0726 05/30/17 1139  GLUCAP 205* 135* 113* 166* 199*   Lipid  Profile: No results for input(s): CHOL, HDL, LDLCALC, TRIG, CHOLHDL, LDLDIRECT in the last 72 hours. Thyroid Function Tests: No results for input(s): TSH, T4TOTAL, FREET4, T3FREE, THYROIDAB in the last 72 hours. Anemia Panel: No results for input(s): VITAMINB12, FOLATE, FERRITIN, TIBC, IRON, RETICCTPCT in the last 72 hours. Urine analysis:    Component Value Date/Time   COLORURINE STRAW (A) 05/27/2017 0627   APPEARANCEUR CLEAR 05/27/2017 0627   LABSPEC 1.011 05/27/2017 0627   PHURINE 6.0 05/27/2017 0627   GLUCOSEU >=500 (A) 05/27/2017 0627   HGBUR NEGATIVE 05/27/2017 0627   BILIRUBINUR NEGATIVE 05/27/2017 0627   KETONESUR NEGATIVE 05/27/2017 0627   PROTEINUR NEGATIVE 05/27/2017 0627   UROBILINOGEN 1.0 09/25/2012 1352   NITRITE NEGATIVE 05/27/2017 0627   LEUKOCYTESUR NEGATIVE 05/27/2017 0627   Sepsis Labs: @LABRCNTIP (procalcitonin:4,lacticidven:4)  ) Recent Results (from the past 240 hour(s))  Blood Culture (routine x 2)     Status: None (Preliminary result)   Collection Time: 05/27/17  3:40 AM  Result Value Ref Range Status   Specimen Description BLOOD RIGHT ANTECUBITAL  Final   Special Requests   Final    BOTTLES DRAWN AEROBIC AND ANAEROBIC Blood Culture adequate volume   Culture NO GROWTH 3 DAYS  Final   Report Status PENDING  Incomplete  Blood Culture (routine x 2)     Status: None (Preliminary result)   Collection Time: 05/27/17  3:40 AM  Result Value Ref Range Status   Specimen Description BLOOD LEFT HAND  Final   Special Requests   Final    BOTTLES DRAWN AEROBIC ONLY Blood Culture adequate volume     Culture NO GROWTH 3 DAYS  Final   Report Status PENDING  Incomplete  Urine culture     Status: None   Collection Time: 05/27/17  6:27 AM  Result Value Ref Range Status   Specimen Description URINE, RANDOM  Final   Special Requests NONE  Final   Culture NO GROWTH  Final   Report Status 05/28/2017 FINAL  Final  Respiratory Panel by PCR     Status: Abnormal   Collection Time: 05/27/17 12:35 PM  Result Value Ref Range Status   Adenovirus NOT DETECTED NOT DETECTED Final   Coronavirus 229E NOT DETECTED NOT DETECTED Final   Coronavirus HKU1 NOT DETECTED NOT DETECTED Final   Coronavirus NL63 NOT DETECTED NOT DETECTED Final   Coronavirus OC43 NOT DETECTED NOT DETECTED Final   Metapneumovirus NOT DETECTED NOT DETECTED Final   Rhinovirus / Enterovirus DETECTED (A) NOT DETECTED Final   Influenza A NOT DETECTED NOT DETECTED Final   Influenza B NOT DETECTED NOT DETECTED Final   Parainfluenza Virus 1 NOT DETECTED NOT DETECTED Final   Parainfluenza Virus 2 NOT DETECTED NOT DETECTED Final   Parainfluenza Virus 3 NOT DETECTED NOT DETECTED Final   Parainfluenza Virus 4 NOT DETECTED NOT DETECTED Final   Respiratory Syncytial Virus NOT DETECTED NOT DETECTED Final   Bordetella pertussis NOT DETECTED NOT DETECTED Final   Chlamydophila pneumoniae NOT DETECTED NOT DETECTED Final   Mycoplasma pneumoniae NOT DETECTED NOT DETECTED Final  Culture, sputum-assessment     Status: None   Collection Time: 05/27/17  1:40 PM  Result Value Ref Range Status   Specimen Description EXPECTORATED SPUTUM  Final   Special Requests NONE  Final   Sputum evaluation   Final    Sputum specimen not acceptable for testing.  Please recollect.   Gram Stain Report Called to,Read Back By and Verified With: RN S.WOODY 1524 [id number] HDAY  Report Status 05/27/2017 FINAL  Final  MRSA PCR Screening     Status: None   Collection Time: 05/27/17  4:55 PM  Result Value Ref Range Status   MRSA by PCR NEGATIVE NEGATIVE Final     Comment:        The GeneXpert MRSA Assay (FDA approved for NASAL specimens only), is one component of a comprehensive MRSA colonization surveillance program. It is not intended to diagnose MRSA infection nor to guide or monitor treatment for MRSA infections.      Radiology Studies: Dg Chest Port 1 View  Result Date: 05/30/2017 CLINICAL DATA:  Cough.  Shortness of breath. EXAM: PORTABLE CHEST 1 VIEW COMPARISON:  05/28/2017. FINDINGS: Cardiomegaly with diffuse bilateral pulmonary interstitial prominence and left pleural effusion consistent CHF. No pneumothorax. Cervical spine fusion. IMPRESSION: Congestive heart failure with bilateral pulmonary interstitial edema and small left pleural effusion . Bibasilar pneumonia cannot be excluded. Electronically Signed   By: Marcello Moores  Register   On: 05/30/2017 07:25   Scheduled Meds: . azithromycin  500 mg Oral Q2000  . budesonide (PULMICORT) nebulizer solution  0.25 mg Nebulization BID  . Cefixime  400 mg Oral Daily  . enoxaparin (LOVENOX) injection  40 mg Subcutaneous Q24H  . glimepiride  0.5 mg Oral Daily  . insulin aspart  0-15 Units Subcutaneous Q4H  . insulin glargine  5 Units Subcutaneous QHS  . ipratropium-albuterol  3 mL Nebulization QID  . levothyroxine  75 mcg Oral QAC breakfast  . linagliptin  5 mg Oral QPM  . loratadine  10 mg Oral Daily  . mouth rinse  15 mL Mouth Rinse BID  . methylPREDNISolone (SOLU-MEDROL) injection  60 mg Intravenous Q12H  . pantoprazole  40 mg Oral Daily  . simvastatin  20 mg Oral q1800   Continuous Infusions:    LOS: 3 days    Time spent: 40 minutes   Verneita Griffes, MD [facility] [phone]

## 2017-05-30 NOTE — Care Management Important Message (Signed)
Important Message  Patient Details  Name: Amber Gray MRN: [id number] Date of Birth: 11-24-1940   Medicare Important Message Given:  Yes    Phillippe Orlick, Rory Percy, RN 05/30/2017, 11:50 AM

## 2017-05-30 NOTE — Plan of Care (Signed)
Problem: Activity: Goal: Risk for activity intolerance will decrease Outcome: Progressing Pt able to transfer from bed to chair with 1 assist. Pt becomes short of breath on exertion, but tolerates activity well.

## 2017-05-31 LAB — MAGNESIUM: MAGNESIUM: 2.2 mg/dL (ref 1.7–2.4)

## 2017-05-31 LAB — BASIC METABOLIC PANEL
ANION GAP: 7 (ref 5–15)
BUN: 29 mg/dL — ABNORMAL HIGH (ref 6–20)
CALCIUM: 8.7 mg/dL — AB (ref 8.9–10.3)
CO2: 21 mmol/L — ABNORMAL LOW (ref 22–32)
CREATININE: 1.14 mg/dL — AB (ref 0.44–1.00)
Chloride: 113 mmol/L — ABNORMAL HIGH (ref 101–111)
GFR calc non Af Amer: 45 mL/min — ABNORMAL LOW (ref >60)
GFR, EST AFRICAN AMERICAN: 52 mL/min — AB (ref >60)
Glucose, Bld: 91 mg/dL (ref 65–99)
Potassium: 3.7 mmol/L (ref 3.5–5.1)
SODIUM: 141 mmol/L (ref 135–145)

## 2017-05-31 LAB — PROCALCITONIN: Procalcitonin: <0.1 ng/mL

## 2017-05-31 LAB — GLUCOSE, CAPILLARY
Glucose-Capillary: 93 mg/dL (ref 65–99)
Glucose-Capillary: 104 mg/dL — ABNORMAL HIGH (ref 65–99)
Glucose-Capillary: 149 mg/dL — ABNORMAL HIGH (ref 65–99)

## 2017-05-31 MED ORDER — AZITHROMYCIN 500 MG PO TABS: 500.0000 mg | ORAL_TABLET | Freq: Every day | ORAL | 0 refills | Status: DC

## 2017-05-31 MED ORDER — BUDESONIDE 0.25 MG/2ML IN SUSP: 0.2500 mg | Freq: Two times a day (BID) | RESPIRATORY_TRACT | 12 refills | Status: DC

## 2017-05-31 MED ORDER — CEFIXIME 400 MG PO CAPS: 400.0000 mg | ORAL_CAPSULE | Freq: Every day | ORAL | 0 refills | Status: DC

## 2017-05-31 MED ORDER — PREDNISONE 50 MG PO TABS
60.0000 mg | ORAL_TABLET | Freq: Every day | ORAL | Status: DC
  Administered 2017-05-31: 60 mg via ORAL
  Filled 2017-05-31: qty 1

## 2017-05-31 MED ORDER — GUAIFENESIN-DM 100-10 MG/5ML PO SYRP: 5.0000 mL | ORAL_SOLUTION | ORAL | 0 refills | Status: DC | PRN

## 2017-05-31 MED ORDER — PREDNISONE 20 MG PO TABS: 60.0000 mg | ORAL_TABLET | Freq: Every day | ORAL | 0 refills | Status: DC

## 2017-05-31 MED ORDER — BENZONATATE 100 MG PO CAPS: 100.0000 mg | ORAL_CAPSULE | Freq: Three times a day (TID) | ORAL | 0 refills | Status: DC | PRN

## 2017-05-31 NOTE — Progress Notes (Signed)
SATURATION QUALIFICATIONS: (This note is used to comply with regulatory documentation for home oxygen)  Patient Saturations on Room Air at Rest = 97%  Patient Saturations on Room Air while Ambulating = 96%  Patient had no use for oxygen at rest or ambulation. Will page MD and let him know.

## 2017-05-31 NOTE — Progress Notes (Signed)
Pt discharged home with daughter. AVS reviewed in full with patient and daughter. Both acknowledge follow up appointment in one month with PCP and that all prescriptions are called into their [facility]. Rollator sent with patient. VSS. BP (!) 147/84 (BP Location: Right Arm)   Pulse 78   Temp 98.6 F (37 C) (Oral)   Resp 20   Ht 5' (1.524 m)   Wt 73 kg (160 lb 15 oz)   SpO2 98%   BMI 31.43 kg/m

## 2017-05-31 NOTE — Care Management Note (Signed)
Case Management Note  Patient Details  Name: Maelin Kurkowski MRN: [id number] Date of Birth: June 19, 1941  Subjective/Objective:       CM following for progression and d/c planning.              Action/Plan: 05/31/2017 Met with pt who is for d/c to home today, pt will need walker and is requesting a rollator, this has been ordered from [facility]. Noted order for home oxygen, however this pt did not qualify for home oxygen as her O2 sats on room air remained in the high 90's and in the high 90"s ambulating on room air.  Rich notified of pending d/c and will deliver walker to room and provide [facility] services within 48-72 hr.   Expected Discharge Date:  05/31/17               Expected Discharge Plan:  [place]  In-House Referral:  NA  Discharge planning Services  CM Consult  Post Acute Care Choice:  Home Health Choice offered to:  Patient  DME Arranged:  Walker rolling with seat DME Agency:  [place]:  RN, PT, Nurse's Aide [place] Agency:  [place]  Status of Service:  Completed, signed off  If discussed at [place] of Stay Meetings, dates discussed:    Additional Comments:  Adron Bene, RN 05/31/2017, 12:08 PM

## 2017-05-31 NOTE — Discharge Summary (Signed)
Physician Discharge Summary  Amber Gray RWE:[id number] DOB: [birth year] DOA: 05/27/2017  PCP: [name], MD  Admit date: 05/27/2017 Discharge date: 05/31/2017  Time spent: 25 minutes  Recommendations for Outpatient Follow-up:  1. Recommend blood work with CBC and complete metabolic panel in about one week 2. Will need chest x-ray in one month to denote clearing 3. Will return to her apartment complex and will order advanced home care 4. Complete antibiotics on 9/1 and she will also complete burst of redness on 60 mg in 5 days from discharge   Discharge Diagnoses:  Principal Problem:   Sepsis due to pneumonia [facility]) Active Problems:   CKD (chronic kidney disease), stage III   Diabetes mellitus type 2, uncontrolled ([place])   Hypothyroid   Hypercholesteremia   Acute hypokalemia   HTN (hypertension)   Discharge Condition: Improved  Diet recommendation: Heart healthy diabetic  Filed Weights   05/28/17 0455 05/29/17 0411 05/30/17 2032  Weight: 71.1 kg (156 lb 12 oz) 73.2 kg (161 lb 6 oz) 73 kg (160 lb 15 oz)    History of present illness:  76 year female with multiple medical illnesses including hypothyroidism hypertension hyperlipidemia CK D stage 3 4 tobacco type 2 diabetes mellitus on oral agents admitted with obstructive cough sputum fever chills pleuritic pain found to have sepsis physiology White count 12 lactic acid 2. 2 and given 3 L of fluids in the ED   Eventually found to have a left lower lung pneumonia and positive rhinovirus see below   Hospital Course:  Left-sided pneumonia and rhinovirus  Treated initially with IV antibiotics transitioned to orals stop date 9/1  Acute hypoxic respiratory failure superimposed on COPD-per smoker Desats screen attempted to probably need 2 L of oxygen ambulating home health ordered  Diabetes mellitus type 2 blood sugars well controlled on insulin and Lantus 5 in the hospital resumed home medications on discharge    Hypertension resumed diuretics on discharge as well as ACE inhibitor needs follow-up labs  Discharge Exam: Vitals:   05/31/17 1026 05/31/17 1033  BP: (!) 151/84   Pulse: 76   Resp: 18   Temp: 98.7 F (37.1 C)   SpO2: 98% 98%    General: Alert pleasant oriented ambulating  Cardiovascular: S1-S2 no murmur rub or gallop  Respiratory: Clinically clear no added sound  Discharge Instructions   Discharge Instructions    Diet - low sodium heart healthy    Complete by:  As directed    Discharge instructions    Complete by:  As directed    Please look at your medication list- please use the antibiotics and complete them in the next.today , He will also probably need some oxygen and we will order a scooter to help you mobilize around the place He will need an x-ray in about one month by her regular physician he will also need some labwork done in the next month   Increase activity slowly    Complete by:  As directed      Current Discharge Medication List    START taking these medications   Details  azithromycin (ZITHROMAX) 500 MG tablet Take 1 tablet (500 mg total) by mouth daily at 8 pm. Qty: 2 tablet, Refills: 0    benzonatate (TESSALON) 100 MG capsule Take 1 capsule (100 mg total) by mouth 3 (three) times daily as needed for cough. Qty: 20 capsule, Refills: 0    budesonide (PULMICORT) 0.25 MG/2ML nebulizer solution Take 2 mLs (0.25 mg total) by  nebulization 2 (two) times daily. Qty: 60 mL, Refills: 12    Cefixime (SUPRAX) 400 MG CAPS capsule Take 1 capsule (400 mg total) by mouth daily. Qty: 2 capsule, Refills: 0    guaiFENesin-dextromethorphan (ROBITUSSIN DM) 100-10 MG/5ML syrup Take 5 mLs by mouth every 4 (four) hours as needed for cough (chest congestion). Qty: 118 mL, Refills: 0    predniSONE (DELTASONE) 20 MG tablet Take 3 tablets (60 mg total) by mouth daily before breakfast. Qty: 15 tablet, Refills: 0      CONTINUE these medications which have NOT CHANGED    Details  acetaminophen (TYLENOL) 325 MG tablet Take 650 mg by mouth every 6 (six) hours as needed for mild pain.    albuterol (PROVENTIL HFA;VENTOLIN HFA) 108 (90 BASE) MCG/ACT inhaler Inhale 2 puffs into the lungs every 6 (six) hours as needed. For wheezing.    cetirizine (ZYRTEC) 10 MG tablet Take 10 mg by mouth daily.    empagliflozin (JARDIANCE) 10 MG TABS tablet Take 10 mg by mouth daily.    furosemide (LASIX) 40 MG tablet TAKE 1/2 TO 1 TAB BY MOUTH EVERY DAY FOR EDEMA AS DIRECTED Refills: 11    glimepiride (AMARYL) 1 MG tablet Take 0.5 mg by mouth daily.    levothyroxine (SYNTHROID, LEVOTHROID) 75 MCG tablet Take 75 mcg by mouth daily.    omeprazole (PRILOSEC) 20 MG capsule Take 20 mg by mouth daily.    simvastatin (ZOCOR) 20 MG tablet Take 20 mg by mouth daily. Refills: 11    TRADJENTA 5 MG TABS tablet Take 5 mg by mouth every evening.       STOP taking these medications     lisinopril (PRINIVIL,ZESTRIL) 20 MG tablet        Allergies  Allergen Reactions  . Metformin And Related Other (See Comments)      The results of significant diagnostics from this hospitalization (including imaging, microbiology, ancillary and laboratory) are listed below for reference.    Significant Diagnostic Studies: Dg Chest 2 View  Result Date: 05/28/2017 CLINICAL DATA:  Sepsis. EXAM: CHEST  2 VIEW COMPARISON:  05/27/2017 . FINDINGS: Mediastinum and hilar structures normal. Cardiomegaly mild pulmonary vascular prominence and interstitial prominence. Mild CHF cannot be excluded. Mild pneumonitis cannot be excluded. Small pleural effusions cannot be excluded . Pneumothorax. Prior cervical spine fusion. IMPRESSION: Cardiomegaly with mild pulmonary vascular prominence and interstitial prominence. Small pleural effusions cannot be excluded . Mild CHF cannot be excluded. Mild pneumonitis cannot be excluded. Electronically Signed   By: Marcello Moores  Register   On: 05/28/2017 09:02   Dg Chest Port 1  View  Result Date: 05/30/2017 CLINICAL DATA:  Cough.  Shortness of breath. EXAM: PORTABLE CHEST 1 VIEW COMPARISON:  05/28/2017. FINDINGS: Cardiomegaly with diffuse bilateral pulmonary interstitial prominence and left pleural effusion consistent CHF. No pneumothorax. Cervical spine fusion. IMPRESSION: Congestive heart failure with bilateral pulmonary interstitial edema and small left pleural effusion . Bibasilar pneumonia cannot be excluded. Electronically Signed   By: Marcello Moores  Register   On: 05/30/2017 07:25   Dg Chest Port 1 View  Result Date: 05/27/2017 CLINICAL DATA:  Fever and cough EXAM: PORTABLE CHEST 1 VIEW COMPARISON:  06/26/2017 FINDINGS: Post surgical hardware in the cervical spine. Low lung volumes. Patchy atelectasis versus small infiltrate at the left base. Stable cardiomediastinal silhouette with atherosclerosis. No pneumothorax. IMPRESSION: Low lung volumes with patchy atelectasis versus minimal infiltrate at the left base Electronically Signed   By: Donavan Foil M.D.   On: 05/27/2017 03:34  Microbiology: Recent Results (from the past 240 hour(s))  Blood Culture (routine x 2)     Status: None (Preliminary result)   Collection Time: 05/27/17  3:40 AM  Result Value Ref Range Status   Specimen Description BLOOD RIGHT ANTECUBITAL  Final   Special Requests   Final    BOTTLES DRAWN AEROBIC AND ANAEROBIC Blood Culture adequate volume   Culture NO GROWTH 3 DAYS  Final   Report Status PENDING  Incomplete  Blood Culture (routine x 2)     Status: None (Preliminary result)   Collection Time: 05/27/17  3:40 AM  Result Value Ref Range Status   Specimen Description BLOOD LEFT HAND  Final   Special Requests   Final    BOTTLES DRAWN AEROBIC ONLY Blood Culture adequate volume   Culture NO GROWTH 3 DAYS  Final   Report Status PENDING  Incomplete  Urine culture     Status: None   Collection Time: 05/27/17  6:27 AM  Result Value Ref Range Status   Specimen Description URINE, RANDOM  Final    Special Requests NONE  Final   Culture NO GROWTH  Final   Report Status 05/28/2017 FINAL  Final  Respiratory Panel by PCR     Status: Abnormal   Collection Time: 05/27/17 12:35 PM  Result Value Ref Range Status   Adenovirus NOT DETECTED NOT DETECTED Final   Coronavirus 229E NOT DETECTED NOT DETECTED Final   Coronavirus HKU1 NOT DETECTED NOT DETECTED Final   Coronavirus NL63 NOT DETECTED NOT DETECTED Final   Coronavirus OC43 NOT DETECTED NOT DETECTED Final   Metapneumovirus NOT DETECTED NOT DETECTED Final   Rhinovirus / Enterovirus DETECTED (A) NOT DETECTED Final   Influenza A NOT DETECTED NOT DETECTED Final   Influenza B NOT DETECTED NOT DETECTED Final   Parainfluenza Virus 1 NOT DETECTED NOT DETECTED Final   Parainfluenza Virus 2 NOT DETECTED NOT DETECTED Final   Parainfluenza Virus 3 NOT DETECTED NOT DETECTED Final   Parainfluenza Virus 4 NOT DETECTED NOT DETECTED Final   Respiratory Syncytial Virus NOT DETECTED NOT DETECTED Final   Bordetella pertussis NOT DETECTED NOT DETECTED Final   Chlamydophila pneumoniae NOT DETECTED NOT DETECTED Final   Mycoplasma pneumoniae NOT DETECTED NOT DETECTED Final  Culture, sputum-assessment     Status: None   Collection Time: 05/27/17  1:40 PM  Result Value Ref Range Status   Specimen Description EXPECTORATED SPUTUM  Final   Special Requests NONE  Final   Sputum evaluation   Final    Sputum specimen not acceptable for testing.  Please recollect.   Gram Stain Report Called to,Read Back By and Verified With: RN S.WOODY [phone] HDAY    Report Status 05/27/2017 FINAL  Final  MRSA PCR Screening     Status: None   Collection Time: 05/27/17  4:55 PM  Result Value Ref Range Status   MRSA by PCR NEGATIVE NEGATIVE Final    Comment:        The GeneXpert MRSA Assay (FDA approved for NASAL specimens only), is one component of a comprehensive MRSA colonization surveillance program. It is not intended to diagnose MRSA infection nor to guide  or monitor treatment for MRSA infections.      Labs: Basic Metabolic Panel:  Recent Labs Lab 05/27/17 0457 05/27/17 1834 05/28/17 0425 05/30/17 0415 05/31/17 0413  NA 138  --  140 142 141  K 3.2*  --  4.3 4.2 3.7  CL 109  --  117* 119* 113*  CO2 17*  --  17* 17* 21*  GLUCOSE 230*  --  172* 119* 91  BUN 15  --  12 20 29*  CREATININE 1.18*  --  0.87 0.99 1.14*  CALCIUM 7.9*  --  7.5* 8.3* 8.7*  MG  --  2.1  --  2.4 2.2   Liver Function Tests:  Recent Labs Lab 05/27/17 0457 05/28/17 0425  AST 24 13*  ALT 12* 13*  ALKPHOS 49 47  BILITOT 0.5 0.6  PROT 6.4* 6.3*  ALBUMIN 3.2* 3.0*   No results for input(s): LIPASE, AMYLASE in the last 168 hours. No results for input(s): AMMONIA in the last 168 hours. CBC:  Recent Labs Lab 05/27/17 0308 05/28/17 0425 05/30/17 0415  WBC 12.6* 15.0* 15.1*  NEUTROABS 8.7*  --  13.7*  HGB 13.3 10.9* 11.3*  HCT 39.1 33.9* 37.1  MCV 89.3 90.9 93.5  PLT 271 173 206   Cardiac Enzymes: No results for input(s): CKTOTAL, CKMB, CKMBINDEX, TROPONINI in the last 168 hours. BNP: BNP (last 3 results) No results for input(s): BNP in the last 8760 hours.  ProBNP (last 3 results) No results for input(s): PROBNP in the last 8760 hours.  CBG:  Recent Labs Lab 05/30/17 1622 05/30/17 2019 05/30/17 2359 05/31/17 0413 05/31/17 0755  GLUCAP 187* 214* 141* 93 104*       Signed:  Nita Sells MD   [facility] 05/31/2017, 11:03 AM

## 2017-06-01 LAB — CULTURE, BLOOD (ROUTINE X 2)
Culture: NO GROWTH
Culture: NO GROWTH
SPECIAL REQUESTS: ADEQUATE
Special Requests: ADEQUATE

## 2017-06-03 DIAGNOSIS — E785 Hyperlipidemia, unspecified: Secondary | ICD-10-CM | POA: Diagnosis not present

## 2017-06-03 DIAGNOSIS — M1711 Unilateral primary osteoarthritis, right knee: Secondary | ICD-10-CM | POA: Diagnosis not present

## 2017-06-03 DIAGNOSIS — N183 Chronic kidney disease, stage 3 (moderate): Secondary | ICD-10-CM | POA: Diagnosis not present

## 2017-06-03 DIAGNOSIS — J189 Pneumonia, unspecified organism: Secondary | ICD-10-CM | POA: Diagnosis not present

## 2017-06-03 DIAGNOSIS — E1122 Type 2 diabetes mellitus with diabetic chronic kidney disease: Secondary | ICD-10-CM | POA: Diagnosis not present

## 2017-06-03 DIAGNOSIS — E039 Hypothyroidism, unspecified: Secondary | ICD-10-CM | POA: Diagnosis not present

## 2017-06-03 DIAGNOSIS — K219 Gastro-esophageal reflux disease without esophagitis: Secondary | ICD-10-CM | POA: Diagnosis not present

## 2017-06-03 DIAGNOSIS — I129 Hypertensive chronic kidney disease with stage 1 through stage 4 chronic kidney disease, or unspecified chronic kidney disease: Secondary | ICD-10-CM | POA: Diagnosis not present

## 2017-06-03 DIAGNOSIS — E1165 Type 2 diabetes mellitus with hyperglycemia: Secondary | ICD-10-CM | POA: Diagnosis not present

## 2017-06-04 ENCOUNTER — Ambulatory Visit (INDEPENDENT_AMBULATORY_CARE_PROVIDER_SITE_OTHER): Payer: Medicare HMO | Admitting: Orthopaedic Surgery

## 2017-06-04 DIAGNOSIS — K219 Gastro-esophageal reflux disease without esophagitis: Secondary | ICD-10-CM | POA: Diagnosis not present

## 2017-06-04 DIAGNOSIS — J189 Pneumonia, unspecified organism: Secondary | ICD-10-CM | POA: Diagnosis not present

## 2017-06-04 DIAGNOSIS — E1122 Type 2 diabetes mellitus with diabetic chronic kidney disease: Secondary | ICD-10-CM | POA: Diagnosis not present

## 2017-06-04 DIAGNOSIS — N183 Chronic kidney disease, stage 3 (moderate): Secondary | ICD-10-CM | POA: Diagnosis not present

## 2017-06-04 DIAGNOSIS — I129 Hypertensive chronic kidney disease with stage 1 through stage 4 chronic kidney disease, or unspecified chronic kidney disease: Secondary | ICD-10-CM | POA: Diagnosis not present

## 2017-06-04 DIAGNOSIS — M1711 Unilateral primary osteoarthritis, right knee: Secondary | ICD-10-CM | POA: Diagnosis not present

## 2017-06-04 DIAGNOSIS — E039 Hypothyroidism, unspecified: Secondary | ICD-10-CM | POA: Diagnosis not present

## 2017-06-04 DIAGNOSIS — E1165 Type 2 diabetes mellitus with hyperglycemia: Secondary | ICD-10-CM | POA: Diagnosis not present

## 2017-06-04 DIAGNOSIS — E785 Hyperlipidemia, unspecified: Secondary | ICD-10-CM | POA: Diagnosis not present

## 2017-06-05 DIAGNOSIS — M1711 Unilateral primary osteoarthritis, right knee: Secondary | ICD-10-CM | POA: Diagnosis not present

## 2017-06-05 DIAGNOSIS — E039 Hypothyroidism, unspecified: Secondary | ICD-10-CM | POA: Diagnosis not present

## 2017-06-05 DIAGNOSIS — E785 Hyperlipidemia, unspecified: Secondary | ICD-10-CM | POA: Diagnosis not present

## 2017-06-05 DIAGNOSIS — E1165 Type 2 diabetes mellitus with hyperglycemia: Secondary | ICD-10-CM | POA: Diagnosis not present

## 2017-06-05 DIAGNOSIS — N183 Chronic kidney disease, stage 3 (moderate): Secondary | ICD-10-CM | POA: Diagnosis not present

## 2017-06-05 DIAGNOSIS — K219 Gastro-esophageal reflux disease without esophagitis: Secondary | ICD-10-CM | POA: Diagnosis not present

## 2017-06-05 DIAGNOSIS — I129 Hypertensive chronic kidney disease with stage 1 through stage 4 chronic kidney disease, or unspecified chronic kidney disease: Secondary | ICD-10-CM | POA: Diagnosis not present

## 2017-06-05 DIAGNOSIS — E1122 Type 2 diabetes mellitus with diabetic chronic kidney disease: Secondary | ICD-10-CM | POA: Diagnosis not present

## 2017-06-05 DIAGNOSIS — J189 Pneumonia, unspecified organism: Secondary | ICD-10-CM | POA: Diagnosis not present

## 2017-06-06 DIAGNOSIS — E1122 Type 2 diabetes mellitus with diabetic chronic kidney disease: Secondary | ICD-10-CM | POA: Diagnosis not present

## 2017-06-06 DIAGNOSIS — K921 Melena: Secondary | ICD-10-CM | POA: Diagnosis not present

## 2017-06-06 DIAGNOSIS — I10 Essential (primary) hypertension: Secondary | ICD-10-CM | POA: Diagnosis not present

## 2017-06-06 DIAGNOSIS — J189 Pneumonia, unspecified organism: Secondary | ICD-10-CM | POA: Diagnosis not present

## 2017-06-06 DIAGNOSIS — E784 Other hyperlipidemia: Secondary | ICD-10-CM | POA: Diagnosis not present

## 2017-06-06 DIAGNOSIS — K219 Gastro-esophageal reflux disease without esophagitis: Secondary | ICD-10-CM | POA: Diagnosis not present

## 2017-06-06 DIAGNOSIS — E1140 Type 2 diabetes mellitus with diabetic neuropathy, unspecified: Secondary | ICD-10-CM | POA: Diagnosis not present

## 2017-06-06 DIAGNOSIS — N183 Chronic kidney disease, stage 3 (moderate): Secondary | ICD-10-CM | POA: Diagnosis not present

## 2017-06-06 DIAGNOSIS — E038 Other specified hypothyroidism: Secondary | ICD-10-CM | POA: Diagnosis not present

## 2017-06-06 DIAGNOSIS — Z23 Encounter for immunization: Secondary | ICD-10-CM | POA: Diagnosis not present

## 2017-06-10 DIAGNOSIS — E1165 Type 2 diabetes mellitus with hyperglycemia: Secondary | ICD-10-CM | POA: Diagnosis not present

## 2017-06-10 DIAGNOSIS — E785 Hyperlipidemia, unspecified: Secondary | ICD-10-CM | POA: Diagnosis not present

## 2017-06-10 DIAGNOSIS — M1711 Unilateral primary osteoarthritis, right knee: Secondary | ICD-10-CM | POA: Diagnosis not present

## 2017-06-10 DIAGNOSIS — J189 Pneumonia, unspecified organism: Secondary | ICD-10-CM | POA: Diagnosis not present

## 2017-06-10 DIAGNOSIS — E039 Hypothyroidism, unspecified: Secondary | ICD-10-CM | POA: Diagnosis not present

## 2017-06-10 DIAGNOSIS — I129 Hypertensive chronic kidney disease with stage 1 through stage 4 chronic kidney disease, or unspecified chronic kidney disease: Secondary | ICD-10-CM | POA: Diagnosis not present

## 2017-06-10 DIAGNOSIS — N183 Chronic kidney disease, stage 3 (moderate): Secondary | ICD-10-CM | POA: Diagnosis not present

## 2017-06-10 DIAGNOSIS — K219 Gastro-esophageal reflux disease without esophagitis: Secondary | ICD-10-CM | POA: Diagnosis not present

## 2017-06-10 DIAGNOSIS — E1122 Type 2 diabetes mellitus with diabetic chronic kidney disease: Secondary | ICD-10-CM | POA: Diagnosis not present

## 2017-06-11 ENCOUNTER — Ambulatory Visit (INDEPENDENT_AMBULATORY_CARE_PROVIDER_SITE_OTHER): Payer: Medicare HMO | Admitting: Sports Medicine

## 2017-06-11 ENCOUNTER — Encounter: Payer: Self-pay | Admitting: Sports Medicine

## 2017-06-11 DIAGNOSIS — M79671 Pain in right foot: Secondary | ICD-10-CM

## 2017-06-11 DIAGNOSIS — E1142 Type 2 diabetes mellitus with diabetic polyneuropathy: Secondary | ICD-10-CM

## 2017-06-11 DIAGNOSIS — B351 Tinea unguium: Secondary | ICD-10-CM | POA: Diagnosis not present

## 2017-06-11 DIAGNOSIS — M79672 Pain in left foot: Secondary | ICD-10-CM

## 2017-06-11 NOTE — Progress Notes (Signed)
Subjective: Amber Gray is a 76 y.o. female patient with history of diabetes who returns to office today complaining of long, painful nails while ambulating in shoes; unable to trim. Patient states that the glucose reading this morning was not recorded. Patient states that she was in the hospital and is now feel better is getting over Rhinovirus. Patient admits to changes with medications at hospital and has given the nurse an updated list.. Patient denies any other issues.   Patient Active Problem List   Diagnosis Date Noted  . Sepsis due to pneumonia ([place]) 05/27/2017  . CKD (chronic kidney disease), stage III 05/27/2017  . Diabetes mellitus type 2, uncontrolled ([place]) 05/27/2017  . Hypothyroid 05/27/2017  . Hypercholesteremia 05/27/2017  . Acute hypokalemia 05/27/2017  . HTN (hypertension) 05/27/2017  . Community acquired pneumonia of left lower lobe of lung ([place])   . Diabetes mellitus with complication ([place])   . Type 2 diabetes mellitus ([place]) 06/27/2015  . Hypertension 06/27/2015  . Hypothyroidism 06/27/2015  . Chest pain 06/27/2015  . Precordial chest pain    Current Outpatient Prescriptions on File Prior to Visit  Medication Sig Dispense Refill  . acetaminophen (TYLENOL) 325 MG tablet Take 650 mg by mouth every 6 (six) hours as needed for mild pain.    Marland Kitchen albuterol (PROVENTIL HFA;VENTOLIN HFA) 108 (90 BASE) MCG/ACT inhaler Inhale 2 puffs into the lungs every 6 (six) hours as needed. For wheezing.    Marland Kitchen azithromycin (ZITHROMAX) 500 MG tablet Take 1 tablet (500 mg total) by mouth daily at 8 pm. 2 tablet 0  . benzonatate (TESSALON) 100 MG capsule Take 1 capsule (100 mg total) by mouth 3 (three) times daily as needed for cough. 20 capsule 0  . budesonide (PULMICORT) 0.25 MG/2ML nebulizer solution Take 2 mLs (0.25 mg total) by nebulization 2 (two) times daily. 60 mL 12  . Cefixime (SUPRAX) 400 MG CAPS capsule Take 1 capsule (400 mg total) by mouth daily. 2 capsule 0  . cetirizine  (ZYRTEC) 10 MG tablet Take 10 mg by mouth daily.    . empagliflozin (JARDIANCE) 10 MG TABS tablet Take 10 mg by mouth daily.    . furosemide (LASIX) 40 MG tablet TAKE 1/2 TO 1 TAB BY MOUTH EVERY DAY FOR EDEMA AS DIRECTED  11  . glimepiride (AMARYL) 1 MG tablet Take 0.5 mg by mouth daily.    Marland Kitchen guaiFENesin-dextromethorphan (ROBITUSSIN DM) 100-10 MG/5ML syrup Take 5 mLs by mouth every 4 (four) hours as needed for cough (chest congestion). 118 mL 0  . levothyroxine (SYNTHROID, LEVOTHROID) 75 MCG tablet Take 75 mcg by mouth daily.    Marland Kitchen omeprazole (PRILOSEC) 20 MG capsule Take 20 mg by mouth daily.    . predniSONE (DELTASONE) 20 MG tablet Take 3 tablets (60 mg total) by mouth daily before breakfast. 15 tablet 0  . simvastatin (ZOCOR) 20 MG tablet Take 20 mg by mouth daily.  11  . TRADJENTA 5 MG TABS tablet Take 5 mg by mouth every evening.      No current facility-administered medications on file prior to visit.    Allergies  Allergen Reactions  . Metformin And Related Other (See Comments)     Objective: General: Patient is awake, alert, and oriented x 3 and in no acute distress.  Integument: Skin is warm, dry and supple bilateral. Nails are tender, long, thickened and dystrophic with subungual debris, consistent with onychomycosis, 1-5 bilateral. No signs of infection. No open lesions or preulcerative lesions present bilateral. Remaining  integument unremarkable.  Vasculature:  Dorsalis Pedis pulse 1/4 bilateral. Posterior Tibial pulse  1/4 bilateral.  Capillary fill time <3 sec 1-5 bilateral. Scant hair growth to the level of the digits.Temperature gradient within normal limits. Mild varicosities present bilateral. Trace edema present bilateral.   Neurology: The patient has diminished sensation measured with a 5.07/10g Semmes Weinstein Monofilament at all pedal sites bilateral. Vibratory sensation decreased bilateral with tuning fork. No Babinski sign present bilateral.   Musculoskeletal:  Asymptomatic hammertoe, bunion, pes planus pedal deformities noted bilateral. Muscular strength 5/5 in all lower extremity muscular groups bilateral without pain on range of motion. No tenderness with calf compression bilateral.  Assessment and Plan: Problem List Items Addressed This Visit    None    Visit Diagnoses    Dermatophytosis of nail    -  Primary   Diabetic polyneuropathy associated with type 2 diabetes mellitus (HCC)       Foot pain, bilateral          -Examined patient. -Discussed and educated patient on diabetic foot care, especially with regards to the vascular, neurological and musculoskeletal systems.  -Stressed the importance of good glycemic control and the detriment of not controlling glucose levels in relation to the foot. -Mechanically debrided all nails 1-5 bilateral using sterile nail nipper and filed with dremel without incident  -Recommend good supportive shoes for foot type -Recommend topicals for neuropathy -Recommend to cont with Rx from PCP of diuretics for swelling  -Answered all patient questions -Patient to return  in 3 months for at risk foot care -Patient advised to call the office if any problems or questions arise in the meantime.  Landis Martins, DPM

## 2017-06-12 DIAGNOSIS — N183 Chronic kidney disease, stage 3 (moderate): Secondary | ICD-10-CM | POA: Diagnosis not present

## 2017-06-12 DIAGNOSIS — E785 Hyperlipidemia, unspecified: Secondary | ICD-10-CM | POA: Diagnosis not present

## 2017-06-12 DIAGNOSIS — J189 Pneumonia, unspecified organism: Secondary | ICD-10-CM | POA: Diagnosis not present

## 2017-06-12 DIAGNOSIS — E1122 Type 2 diabetes mellitus with diabetic chronic kidney disease: Secondary | ICD-10-CM | POA: Diagnosis not present

## 2017-06-12 DIAGNOSIS — K219 Gastro-esophageal reflux disease without esophagitis: Secondary | ICD-10-CM | POA: Diagnosis not present

## 2017-06-12 DIAGNOSIS — E039 Hypothyroidism, unspecified: Secondary | ICD-10-CM | POA: Diagnosis not present

## 2017-06-12 DIAGNOSIS — E1165 Type 2 diabetes mellitus with hyperglycemia: Secondary | ICD-10-CM | POA: Diagnosis not present

## 2017-06-12 DIAGNOSIS — M1711 Unilateral primary osteoarthritis, right knee: Secondary | ICD-10-CM | POA: Diagnosis not present

## 2017-06-12 DIAGNOSIS — I129 Hypertensive chronic kidney disease with stage 1 through stage 4 chronic kidney disease, or unspecified chronic kidney disease: Secondary | ICD-10-CM | POA: Diagnosis not present

## 2017-06-13 DIAGNOSIS — J189 Pneumonia, unspecified organism: Secondary | ICD-10-CM | POA: Diagnosis not present

## 2017-06-13 DIAGNOSIS — R609 Edema, unspecified: Secondary | ICD-10-CM | POA: Diagnosis not present

## 2017-06-13 DIAGNOSIS — E1140 Type 2 diabetes mellitus with diabetic neuropathy, unspecified: Secondary | ICD-10-CM | POA: Diagnosis not present

## 2017-06-13 DIAGNOSIS — E784 Other hyperlipidemia: Secondary | ICD-10-CM | POA: Diagnosis not present

## 2017-06-13 DIAGNOSIS — N183 Chronic kidney disease, stage 3 (moderate): Secondary | ICD-10-CM | POA: Diagnosis not present

## 2017-06-13 DIAGNOSIS — E663 Overweight: Secondary | ICD-10-CM | POA: Diagnosis not present

## 2017-06-13 DIAGNOSIS — K921 Melena: Secondary | ICD-10-CM | POA: Diagnosis not present

## 2017-06-13 DIAGNOSIS — E1122 Type 2 diabetes mellitus with diabetic chronic kidney disease: Secondary | ICD-10-CM | POA: Diagnosis not present

## 2017-06-13 DIAGNOSIS — I129 Hypertensive chronic kidney disease with stage 1 through stage 4 chronic kidney disease, or unspecified chronic kidney disease: Secondary | ICD-10-CM | POA: Diagnosis not present

## 2017-06-14 DIAGNOSIS — K219 Gastro-esophageal reflux disease without esophagitis: Secondary | ICD-10-CM | POA: Diagnosis not present

## 2017-06-14 DIAGNOSIS — M1711 Unilateral primary osteoarthritis, right knee: Secondary | ICD-10-CM | POA: Diagnosis not present

## 2017-06-14 DIAGNOSIS — I129 Hypertensive chronic kidney disease with stage 1 through stage 4 chronic kidney disease, or unspecified chronic kidney disease: Secondary | ICD-10-CM | POA: Diagnosis not present

## 2017-06-14 DIAGNOSIS — E1165 Type 2 diabetes mellitus with hyperglycemia: Secondary | ICD-10-CM | POA: Diagnosis not present

## 2017-06-14 DIAGNOSIS — E1122 Type 2 diabetes mellitus with diabetic chronic kidney disease: Secondary | ICD-10-CM | POA: Diagnosis not present

## 2017-06-14 DIAGNOSIS — E039 Hypothyroidism, unspecified: Secondary | ICD-10-CM | POA: Diagnosis not present

## 2017-06-14 DIAGNOSIS — N183 Chronic kidney disease, stage 3 (moderate): Secondary | ICD-10-CM | POA: Diagnosis not present

## 2017-06-14 DIAGNOSIS — E785 Hyperlipidemia, unspecified: Secondary | ICD-10-CM | POA: Diagnosis not present

## 2017-06-14 DIAGNOSIS — J189 Pneumonia, unspecified organism: Secondary | ICD-10-CM | POA: Diagnosis not present

## 2017-06-17 DIAGNOSIS — I129 Hypertensive chronic kidney disease with stage 1 through stage 4 chronic kidney disease, or unspecified chronic kidney disease: Secondary | ICD-10-CM | POA: Diagnosis not present

## 2017-06-17 DIAGNOSIS — J189 Pneumonia, unspecified organism: Secondary | ICD-10-CM | POA: Diagnosis not present

## 2017-06-17 DIAGNOSIS — K219 Gastro-esophageal reflux disease without esophagitis: Secondary | ICD-10-CM | POA: Diagnosis not present

## 2017-06-17 DIAGNOSIS — E039 Hypothyroidism, unspecified: Secondary | ICD-10-CM | POA: Diagnosis not present

## 2017-06-17 DIAGNOSIS — E785 Hyperlipidemia, unspecified: Secondary | ICD-10-CM | POA: Diagnosis not present

## 2017-06-17 DIAGNOSIS — E1165 Type 2 diabetes mellitus with hyperglycemia: Secondary | ICD-10-CM | POA: Diagnosis not present

## 2017-06-17 DIAGNOSIS — N183 Chronic kidney disease, stage 3 (moderate): Secondary | ICD-10-CM | POA: Diagnosis not present

## 2017-06-17 DIAGNOSIS — E1122 Type 2 diabetes mellitus with diabetic chronic kidney disease: Secondary | ICD-10-CM | POA: Diagnosis not present

## 2017-06-17 DIAGNOSIS — M1711 Unilateral primary osteoarthritis, right knee: Secondary | ICD-10-CM | POA: Diagnosis not present

## 2017-06-20 DIAGNOSIS — E1165 Type 2 diabetes mellitus with hyperglycemia: Secondary | ICD-10-CM | POA: Diagnosis not present

## 2017-06-20 DIAGNOSIS — J189 Pneumonia, unspecified organism: Secondary | ICD-10-CM | POA: Diagnosis not present

## 2017-06-20 DIAGNOSIS — N183 Chronic kidney disease, stage 3 (moderate): Secondary | ICD-10-CM | POA: Diagnosis not present

## 2017-06-20 DIAGNOSIS — I129 Hypertensive chronic kidney disease with stage 1 through stage 4 chronic kidney disease, or unspecified chronic kidney disease: Secondary | ICD-10-CM | POA: Diagnosis not present

## 2017-06-20 DIAGNOSIS — E785 Hyperlipidemia, unspecified: Secondary | ICD-10-CM | POA: Diagnosis not present

## 2017-06-20 DIAGNOSIS — M1711 Unilateral primary osteoarthritis, right knee: Secondary | ICD-10-CM | POA: Diagnosis not present

## 2017-06-20 DIAGNOSIS — E1122 Type 2 diabetes mellitus with diabetic chronic kidney disease: Secondary | ICD-10-CM | POA: Diagnosis not present

## 2017-06-20 DIAGNOSIS — E039 Hypothyroidism, unspecified: Secondary | ICD-10-CM | POA: Diagnosis not present

## 2017-06-20 DIAGNOSIS — K219 Gastro-esophageal reflux disease without esophagitis: Secondary | ICD-10-CM | POA: Diagnosis not present

## 2017-06-25 DIAGNOSIS — E039 Hypothyroidism, unspecified: Secondary | ICD-10-CM | POA: Diagnosis not present

## 2017-06-25 DIAGNOSIS — N183 Chronic kidney disease, stage 3 (moderate): Secondary | ICD-10-CM | POA: Diagnosis not present

## 2017-06-25 DIAGNOSIS — J189 Pneumonia, unspecified organism: Secondary | ICD-10-CM | POA: Diagnosis not present

## 2017-06-25 DIAGNOSIS — M1711 Unilateral primary osteoarthritis, right knee: Secondary | ICD-10-CM | POA: Diagnosis not present

## 2017-06-25 DIAGNOSIS — E785 Hyperlipidemia, unspecified: Secondary | ICD-10-CM | POA: Diagnosis not present

## 2017-06-25 DIAGNOSIS — E1122 Type 2 diabetes mellitus with diabetic chronic kidney disease: Secondary | ICD-10-CM | POA: Diagnosis not present

## 2017-06-25 DIAGNOSIS — E1165 Type 2 diabetes mellitus with hyperglycemia: Secondary | ICD-10-CM | POA: Diagnosis not present

## 2017-06-25 DIAGNOSIS — K219 Gastro-esophageal reflux disease without esophagitis: Secondary | ICD-10-CM | POA: Diagnosis not present

## 2017-06-25 DIAGNOSIS — I129 Hypertensive chronic kidney disease with stage 1 through stage 4 chronic kidney disease, or unspecified chronic kidney disease: Secondary | ICD-10-CM | POA: Diagnosis not present

## 2017-06-26 DIAGNOSIS — I129 Hypertensive chronic kidney disease with stage 1 through stage 4 chronic kidney disease, or unspecified chronic kidney disease: Secondary | ICD-10-CM | POA: Diagnosis not present

## 2017-06-26 DIAGNOSIS — E1122 Type 2 diabetes mellitus with diabetic chronic kidney disease: Secondary | ICD-10-CM | POA: Diagnosis not present

## 2017-06-26 DIAGNOSIS — J189 Pneumonia, unspecified organism: Secondary | ICD-10-CM | POA: Diagnosis not present

## 2017-06-26 DIAGNOSIS — E785 Hyperlipidemia, unspecified: Secondary | ICD-10-CM | POA: Diagnosis not present

## 2017-06-26 DIAGNOSIS — M1711 Unilateral primary osteoarthritis, right knee: Secondary | ICD-10-CM | POA: Diagnosis not present

## 2017-06-26 DIAGNOSIS — N183 Chronic kidney disease, stage 3 (moderate): Secondary | ICD-10-CM | POA: Diagnosis not present

## 2017-06-26 DIAGNOSIS — E1165 Type 2 diabetes mellitus with hyperglycemia: Secondary | ICD-10-CM | POA: Diagnosis not present

## 2017-06-26 DIAGNOSIS — K219 Gastro-esophageal reflux disease without esophagitis: Secondary | ICD-10-CM | POA: Diagnosis not present

## 2017-06-26 DIAGNOSIS — E039 Hypothyroidism, unspecified: Secondary | ICD-10-CM | POA: Diagnosis not present

## 2017-06-27 DIAGNOSIS — J189 Pneumonia, unspecified organism: Secondary | ICD-10-CM | POA: Diagnosis not present

## 2017-06-27 DIAGNOSIS — E785 Hyperlipidemia, unspecified: Secondary | ICD-10-CM | POA: Diagnosis not present

## 2017-06-27 DIAGNOSIS — E1165 Type 2 diabetes mellitus with hyperglycemia: Secondary | ICD-10-CM | POA: Diagnosis not present

## 2017-06-27 DIAGNOSIS — M1711 Unilateral primary osteoarthritis, right knee: Secondary | ICD-10-CM | POA: Diagnosis not present

## 2017-06-27 DIAGNOSIS — I129 Hypertensive chronic kidney disease with stage 1 through stage 4 chronic kidney disease, or unspecified chronic kidney disease: Secondary | ICD-10-CM | POA: Diagnosis not present

## 2017-06-27 DIAGNOSIS — E1122 Type 2 diabetes mellitus with diabetic chronic kidney disease: Secondary | ICD-10-CM | POA: Diagnosis not present

## 2017-06-27 DIAGNOSIS — N183 Chronic kidney disease, stage 3 (moderate): Secondary | ICD-10-CM | POA: Diagnosis not present

## 2017-06-27 DIAGNOSIS — E039 Hypothyroidism, unspecified: Secondary | ICD-10-CM | POA: Diagnosis not present

## 2017-06-27 DIAGNOSIS — K219 Gastro-esophageal reflux disease without esophagitis: Secondary | ICD-10-CM | POA: Diagnosis not present

## 2017-07-01 DIAGNOSIS — I129 Hypertensive chronic kidney disease with stage 1 through stage 4 chronic kidney disease, or unspecified chronic kidney disease: Secondary | ICD-10-CM | POA: Diagnosis not present

## 2017-07-01 DIAGNOSIS — E785 Hyperlipidemia, unspecified: Secondary | ICD-10-CM | POA: Diagnosis not present

## 2017-07-01 DIAGNOSIS — J189 Pneumonia, unspecified organism: Secondary | ICD-10-CM | POA: Diagnosis not present

## 2017-07-01 DIAGNOSIS — M1711 Unilateral primary osteoarthritis, right knee: Secondary | ICD-10-CM | POA: Diagnosis not present

## 2017-07-01 DIAGNOSIS — E1122 Type 2 diabetes mellitus with diabetic chronic kidney disease: Secondary | ICD-10-CM | POA: Diagnosis not present

## 2017-07-01 DIAGNOSIS — E1165 Type 2 diabetes mellitus with hyperglycemia: Secondary | ICD-10-CM | POA: Diagnosis not present

## 2017-07-01 DIAGNOSIS — E039 Hypothyroidism, unspecified: Secondary | ICD-10-CM | POA: Diagnosis not present

## 2017-07-01 DIAGNOSIS — K219 Gastro-esophageal reflux disease without esophagitis: Secondary | ICD-10-CM | POA: Diagnosis not present

## 2017-07-01 DIAGNOSIS — N183 Chronic kidney disease, stage 3 (moderate): Secondary | ICD-10-CM | POA: Diagnosis not present

## 2017-07-08 DIAGNOSIS — N183 Chronic kidney disease, stage 3 (moderate): Secondary | ICD-10-CM | POA: Diagnosis not present

## 2017-07-08 DIAGNOSIS — E1165 Type 2 diabetes mellitus with hyperglycemia: Secondary | ICD-10-CM | POA: Diagnosis not present

## 2017-07-08 DIAGNOSIS — I129 Hypertensive chronic kidney disease with stage 1 through stage 4 chronic kidney disease, or unspecified chronic kidney disease: Secondary | ICD-10-CM | POA: Diagnosis not present

## 2017-07-08 DIAGNOSIS — K219 Gastro-esophageal reflux disease without esophagitis: Secondary | ICD-10-CM | POA: Diagnosis not present

## 2017-07-08 DIAGNOSIS — M1711 Unilateral primary osteoarthritis, right knee: Secondary | ICD-10-CM | POA: Diagnosis not present

## 2017-07-08 DIAGNOSIS — E1122 Type 2 diabetes mellitus with diabetic chronic kidney disease: Secondary | ICD-10-CM | POA: Diagnosis not present

## 2017-07-08 DIAGNOSIS — J189 Pneumonia, unspecified organism: Secondary | ICD-10-CM | POA: Diagnosis not present

## 2017-07-08 DIAGNOSIS — E785 Hyperlipidemia, unspecified: Secondary | ICD-10-CM | POA: Diagnosis not present

## 2017-07-08 DIAGNOSIS — E039 Hypothyroidism, unspecified: Secondary | ICD-10-CM | POA: Diagnosis not present

## 2017-07-09 DIAGNOSIS — K219 Gastro-esophageal reflux disease without esophagitis: Secondary | ICD-10-CM | POA: Diagnosis not present

## 2017-07-09 DIAGNOSIS — E1165 Type 2 diabetes mellitus with hyperglycemia: Secondary | ICD-10-CM | POA: Diagnosis not present

## 2017-07-09 DIAGNOSIS — M1711 Unilateral primary osteoarthritis, right knee: Secondary | ICD-10-CM | POA: Diagnosis not present

## 2017-07-09 DIAGNOSIS — E1122 Type 2 diabetes mellitus with diabetic chronic kidney disease: Secondary | ICD-10-CM | POA: Diagnosis not present

## 2017-07-09 DIAGNOSIS — E785 Hyperlipidemia, unspecified: Secondary | ICD-10-CM | POA: Diagnosis not present

## 2017-07-09 DIAGNOSIS — J189 Pneumonia, unspecified organism: Secondary | ICD-10-CM | POA: Diagnosis not present

## 2017-07-09 DIAGNOSIS — I129 Hypertensive chronic kidney disease with stage 1 through stage 4 chronic kidney disease, or unspecified chronic kidney disease: Secondary | ICD-10-CM | POA: Diagnosis not present

## 2017-07-09 DIAGNOSIS — E039 Hypothyroidism, unspecified: Secondary | ICD-10-CM | POA: Diagnosis not present

## 2017-07-09 DIAGNOSIS — N183 Chronic kidney disease, stage 3 (moderate): Secondary | ICD-10-CM | POA: Diagnosis not present

## 2017-07-17 DIAGNOSIS — E1165 Type 2 diabetes mellitus with hyperglycemia: Secondary | ICD-10-CM | POA: Diagnosis not present

## 2017-07-17 DIAGNOSIS — K219 Gastro-esophageal reflux disease without esophagitis: Secondary | ICD-10-CM | POA: Diagnosis not present

## 2017-07-17 DIAGNOSIS — J189 Pneumonia, unspecified organism: Secondary | ICD-10-CM | POA: Diagnosis not present

## 2017-07-17 DIAGNOSIS — I129 Hypertensive chronic kidney disease with stage 1 through stage 4 chronic kidney disease, or unspecified chronic kidney disease: Secondary | ICD-10-CM | POA: Diagnosis not present

## 2017-07-17 DIAGNOSIS — M1711 Unilateral primary osteoarthritis, right knee: Secondary | ICD-10-CM | POA: Diagnosis not present

## 2017-07-17 DIAGNOSIS — E1122 Type 2 diabetes mellitus with diabetic chronic kidney disease: Secondary | ICD-10-CM | POA: Diagnosis not present

## 2017-07-17 DIAGNOSIS — E039 Hypothyroidism, unspecified: Secondary | ICD-10-CM | POA: Diagnosis not present

## 2017-07-17 DIAGNOSIS — N183 Chronic kidney disease, stage 3 (moderate): Secondary | ICD-10-CM | POA: Diagnosis not present

## 2017-07-17 DIAGNOSIS — E785 Hyperlipidemia, unspecified: Secondary | ICD-10-CM | POA: Diagnosis not present

## 2017-07-22 DIAGNOSIS — N183 Chronic kidney disease, stage 3 (moderate): Secondary | ICD-10-CM | POA: Diagnosis not present

## 2017-07-22 DIAGNOSIS — K219 Gastro-esophageal reflux disease without esophagitis: Secondary | ICD-10-CM | POA: Diagnosis not present

## 2017-07-22 DIAGNOSIS — E1122 Type 2 diabetes mellitus with diabetic chronic kidney disease: Secondary | ICD-10-CM | POA: Diagnosis not present

## 2017-07-22 DIAGNOSIS — M1711 Unilateral primary osteoarthritis, right knee: Secondary | ICD-10-CM | POA: Diagnosis not present

## 2017-07-22 DIAGNOSIS — E785 Hyperlipidemia, unspecified: Secondary | ICD-10-CM | POA: Diagnosis not present

## 2017-07-22 DIAGNOSIS — J189 Pneumonia, unspecified organism: Secondary | ICD-10-CM | POA: Diagnosis not present

## 2017-07-22 DIAGNOSIS — I129 Hypertensive chronic kidney disease with stage 1 through stage 4 chronic kidney disease, or unspecified chronic kidney disease: Secondary | ICD-10-CM | POA: Diagnosis not present

## 2017-07-22 DIAGNOSIS — E1165 Type 2 diabetes mellitus with hyperglycemia: Secondary | ICD-10-CM | POA: Diagnosis not present

## 2017-07-22 DIAGNOSIS — E039 Hypothyroidism, unspecified: Secondary | ICD-10-CM | POA: Diagnosis not present

## 2017-07-30 DIAGNOSIS — I129 Hypertensive chronic kidney disease with stage 1 through stage 4 chronic kidney disease, or unspecified chronic kidney disease: Secondary | ICD-10-CM | POA: Diagnosis not present

## 2017-07-30 DIAGNOSIS — N183 Chronic kidney disease, stage 3 (moderate): Secondary | ICD-10-CM | POA: Diagnosis not present

## 2017-07-30 DIAGNOSIS — K219 Gastro-esophageal reflux disease without esophagitis: Secondary | ICD-10-CM | POA: Diagnosis not present

## 2017-07-30 DIAGNOSIS — J189 Pneumonia, unspecified organism: Secondary | ICD-10-CM | POA: Diagnosis not present

## 2017-07-30 DIAGNOSIS — E039 Hypothyroidism, unspecified: Secondary | ICD-10-CM | POA: Diagnosis not present

## 2017-07-30 DIAGNOSIS — E1122 Type 2 diabetes mellitus with diabetic chronic kidney disease: Secondary | ICD-10-CM | POA: Diagnosis not present

## 2017-07-30 DIAGNOSIS — M1711 Unilateral primary osteoarthritis, right knee: Secondary | ICD-10-CM | POA: Diagnosis not present

## 2017-07-30 DIAGNOSIS — E1165 Type 2 diabetes mellitus with hyperglycemia: Secondary | ICD-10-CM | POA: Diagnosis not present

## 2017-07-30 DIAGNOSIS — E785 Hyperlipidemia, unspecified: Secondary | ICD-10-CM | POA: Diagnosis not present

## 2017-08-16 DIAGNOSIS — E119 Type 2 diabetes mellitus without complications: Secondary | ICD-10-CM | POA: Diagnosis not present

## 2017-08-16 DIAGNOSIS — Z961 Presence of intraocular lens: Secondary | ICD-10-CM | POA: Diagnosis not present

## 2017-08-27 DIAGNOSIS — M1990 Unspecified osteoarthritis, unspecified site: Secondary | ICD-10-CM | POA: Diagnosis not present

## 2017-08-27 DIAGNOSIS — E1122 Type 2 diabetes mellitus with diabetic chronic kidney disease: Secondary | ICD-10-CM | POA: Diagnosis not present

## 2017-08-27 DIAGNOSIS — E038 Other specified hypothyroidism: Secondary | ICD-10-CM | POA: Diagnosis not present

## 2017-08-27 DIAGNOSIS — R627 Adult failure to thrive: Secondary | ICD-10-CM | POA: Diagnosis not present

## 2017-08-27 DIAGNOSIS — I129 Hypertensive chronic kidney disease with stage 1 through stage 4 chronic kidney disease, or unspecified chronic kidney disease: Secondary | ICD-10-CM | POA: Diagnosis not present

## 2017-08-27 DIAGNOSIS — E7849 Other hyperlipidemia: Secondary | ICD-10-CM | POA: Diagnosis not present

## 2017-08-27 DIAGNOSIS — Z6827 Body mass index (BMI) 27.0-27.9, adult: Secondary | ICD-10-CM | POA: Diagnosis not present

## 2017-08-27 DIAGNOSIS — N183 Chronic kidney disease, stage 3 (moderate): Secondary | ICD-10-CM | POA: Diagnosis not present

## 2017-08-27 DIAGNOSIS — E663 Overweight: Secondary | ICD-10-CM | POA: Diagnosis not present

## 2017-09-10 ENCOUNTER — Ambulatory Visit: Payer: Medicare HMO | Admitting: Sports Medicine

## 2017-10-15 ENCOUNTER — Encounter (INDEPENDENT_AMBULATORY_CARE_PROVIDER_SITE_OTHER): Payer: Self-pay | Admitting: Orthopaedic Surgery

## 2017-10-15 ENCOUNTER — Ambulatory Visit (INDEPENDENT_AMBULATORY_CARE_PROVIDER_SITE_OTHER): Payer: Medicare HMO | Admitting: Orthopaedic Surgery

## 2017-10-15 VITALS — BP 117/67 | HR 70 | Ht 60.0 in | Wt 140.0 lb

## 2017-10-15 DIAGNOSIS — M170 Bilateral primary osteoarthritis of knee: Secondary | ICD-10-CM | POA: Insufficient documentation

## 2017-10-15 MED ORDER — BUPIVACAINE HCL 0.25 % IJ SOLN
4.0000 mL | INTRAMUSCULAR | Status: AC | PRN
  Administered 2017-10-15: 4 mL via INTRA_ARTICULAR

## 2017-10-15 MED ORDER — METHYLPREDNISOLONE ACETATE 40 MG/ML IJ SUSP
40.0000 mg | INTRAMUSCULAR | Status: AC | PRN
  Administered 2017-10-15: 40 mg via INTRA_ARTICULAR

## 2017-10-15 MED ORDER — LIDOCAINE HCL 1 % IJ SOLN
0.5000 mL | INTRAMUSCULAR | Status: AC | PRN
  Administered 2017-10-15: .5 mL

## 2017-10-15 NOTE — Progress Notes (Signed)
Office Visit Note   Patient: Amber Gray.           Date of Birth: [birth year]           MRN: [id number] Visit Date: 10/15/2017              Requested by: [name], [address], [place] [address] PCP: [name], MD   Assessment & Plan: Visit Diagnoses:  1. Bilateral primary osteoarthritis of knee     Plan: Intra-articular injection performed right knee.  She tolerated the procedure well return if she has persistent problems.  Follow-Up Instructions: Return if symptoms worsen or fail to improve.   Orders:  Orders Placed This Encounter  Procedures  . Large Joint Inj: R knee   No orders of the defined types were placed in this encounter.     Procedures: Large Joint Inj: R knee on 10/15/2017 3:23 PM Indications: pain and joint swelling Details: 22 G 1.5 in needle, anterolateral approach  Arthrogram: No  Medications: 40 mg methylPREDNISolone acetate 40 MG/ML; 0.5 mL lidocaine 1 %; 4 mL bupivacaine 0.25 % Outcome: tolerated well, no immediate complications Procedure, treatment alternatives, risks and benefits explained, specific risks discussed. Consent was given by the patient. Immediately prior to procedure a time out was called to verify the correct patient, procedure, equipment, support staff and site/side marked as required. Patient was prepped and draped in the usual sterile fashion.       Clinical Data: No additional findings.   Subjective: Chief Complaint  Patient presents with  . Right Knee - Pain    HPI 77 year old female returns with recurrent right knee pain.  Previously she had problems with hyperglycemia and to be in the hospital 5 days states her diabetes is doing well now and CBG was 130.  She is amatory with the right knee limp.  She has more pain medial than lateral joint line.  Occasionally has some catching but no true locking.  Review of Systems 14 point review of systems updated unchanged from 04/09/2017 office note  other than as mentioned in HPI.   Objective: Vital Signs: BP 117/67   Pulse 70   Ht 5' (1.524 m)   Wt 140 lb (63.5 kg)   BMI 27.34 kg/m   Physical Exam  Constitutional: She is oriented to person, place, and time. She appears well-developed.  HENT:  Head: Normocephalic.  Right Ear: External ear normal.  Left Ear: External ear normal.  Eyes: Pupils are equal, round, and reactive to light.  Neck: No tracheal deviation present. No thyromegaly present.  Cardiovascular: Normal rate.  Pulmonary/Chest: Effort normal.  Abdominal: Soft.  Neurological: She is alert and oriented to person, place, and time.  Skin: Skin is warm and dry.  Psychiatric: She has a normal mood and affect. Her behavior is normal.    Ortho Exam there is medial joint line tenderness right knee.  Collateral ligaments are stable.  Crepitus with knee range of motion no increased warmth.  No pain with hip range of motion negative popliteal compression test.  Distal pulses are intact.  No venous stasis changes.  Specialty Comments:  No specialty comments available.  Imaging: No results found.   PMFS History: Patient Active Problem List   Diagnosis Date Noted  . 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 Sepsis due to pneumonia ([place]) 05/27/2017  . CKD (chronic kidney disease), stage III ([place]) 05/27/2017  . Diabetes mellitus type 2, uncontrolled ([place]) 05/27/2017  . Hypothyroid  05/27/2017  . Hypercholesteremia 05/27/2017  . Acute hypokalemia 05/27/2017  . HTN (hypertension) 05/27/2017  . Community acquired pneumonia of left lower lobe of lung ([place])   . Diabetes mellitus with complication ([place])   . Type 2 diabetes mellitus ([place]) 06/27/2015  . Hypertension 06/27/2015  . Hypothyroidism 06/27/2015  . Chest pain 06/27/2015  . Precordial chest pain    Past Medical History:  Diagnosis Date  . Arthritis    "all over my body" (05/27/2017)  . Chronic kidney disease (CKD), stage III (moderate) (HCC)   .  Chronic lower back pain   . Frequent UTI   . GERD (gastroesophageal reflux disease)   . Headache    "monthly" (05/27/2017)  . History of blood transfusion    "when I had my neck fusion"  . History of hiatal hernia   . Hypercholesteremia   . Hypertension   . Hypothyroid   . Pneumonia 2000s X 1; 05/27/2017  . Seasonal allergies   . Type II diabetes mellitus (HCC)     Family History  Problem Relation Age of Onset  . Alzheimer's disease Mother   . Heart attack Father   . Heart disease Father   . Dwarfism Sister   . Colon cancer Neg Hx   . Colon polyps Neg Hx   . Esophageal cancer Neg Hx   . Rectal cancer Neg Hx   . Stomach cancer Neg Hx     Past Surgical History:  Procedure Laterality Date  . BACK SURGERY    . BREAST BIOPSY Left   . CATARACT EXTRACTION W/ INTRAOCULAR LENS  IMPLANT, BILATERAL Bilateral   . COLONOSCOPY  10/25/2005   normal  . DILATION AND CURETTAGE OF UTERUS    . POSTERIOR CERVICAL FUSION/FORAMINOTOMY     "fused 4,5,6"  . TONSILLECTOMY     Social History   Occupational History  . Not on file  Tobacco Use  . Smoking status: Former Smoker    Packs/day: 1.00    Years: 6.00    Pack years: 6.00    Types: Cigarettes    Last attempt to quit: 10/01/1964    Years since quitting: 53.0  . Smokeless tobacco: Never Used  Substance and Sexual Activity  . Alcohol use: No  . Drug use: No  . Sexual activity: No

## 2017-10-22 ENCOUNTER — Ambulatory Visit: Payer: Medicare HMO | Admitting: Sports Medicine

## 2017-10-22 ENCOUNTER — Encounter: Payer: Self-pay | Admitting: Sports Medicine

## 2017-10-22 DIAGNOSIS — M79671 Pain in right foot: Secondary | ICD-10-CM | POA: Diagnosis not present

## 2017-10-22 DIAGNOSIS — B351 Tinea unguium: Secondary | ICD-10-CM | POA: Diagnosis not present

## 2017-10-22 DIAGNOSIS — M79672 Pain in left foot: Secondary | ICD-10-CM

## 2017-10-22 DIAGNOSIS — E1142 Type 2 diabetes mellitus with diabetic polyneuropathy: Secondary | ICD-10-CM | POA: Diagnosis not present

## 2017-10-22 NOTE — Progress Notes (Signed)
Subjective: Amber Gray is a 77 y.o. female patient with history of diabetes who returns to office today complaining of long, painful nails while ambulating in shoes; unable to trim. Patient states that the glucose reading this morning was good, "controlled". Patient states that A1c was 6. She had injections in R Knee that is doing better. Patient denies any other issues.   Patient Active Problem List   Diagnosis Date Noted  . 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 Sepsis due to pneumonia ([place]) 05/27/2017  . CKD (chronic kidney disease), stage III ([place]) 05/27/2017  . Diabetes mellitus type 2, uncontrolled ([place]) 05/27/2017  . Hypothyroid 05/27/2017  . Hypercholesteremia 05/27/2017  . Acute hypokalemia 05/27/2017  . HTN (hypertension) 05/27/2017  . Community acquired pneumonia of left lower lobe of lung ([place])   . Diabetes mellitus with complication ([place])   . Type 2 diabetes mellitus ([place]) 06/27/2015  . Hypertension 06/27/2015  . Hypothyroidism 06/27/2015  . Chest pain 06/27/2015  . Precordial chest pain    Current Outpatient Medications on File Prior to Visit  Medication Sig Dispense Refill  . acetaminophen (TYLENOL) 325 MG tablet Take 650 mg by mouth every 6 (six) hours as needed for mild pain.    Marland Kitchen albuterol (PROVENTIL HFA;VENTOLIN HFA) 108 (90 BASE) MCG/ACT inhaler Inhale 2 puffs into the lungs every 6 (six) hours as needed. For wheezing.    Marland Kitchen azithromycin (ZITHROMAX) 500 MG tablet Take 1 tablet (500 mg total) by mouth daily at 8 pm. (Patient not taking: Reported on 10/15/2017) 2 tablet 0  . benzonatate (TESSALON) 100 MG capsule Take 1 capsule (100 mg total) by mouth 3 (three) times daily as needed for cough. (Patient not taking: Reported on 10/15/2017) 20 capsule 0  . budesonide (PULMICORT) 0.25 MG/2ML nebulizer solution Take 2 mLs (0.25 mg total) by nebulization 2 (two) times daily. (Patient not taking: Reported on 10/15/2017) 60 mL 12  . Cefixime (SUPRAX) 400 MG  CAPS capsule Take 1 capsule (400 mg total) by mouth daily. (Patient not taking: Reported on 10/15/2017) 2 capsule 0  . cetirizine (ZYRTEC) 10 MG tablet Take 10 mg by mouth daily.    . empagliflozin (JARDIANCE) 10 MG TABS tablet Take 10 mg by mouth daily.    . furosemide (LASIX) 40 MG tablet TAKE 1/2 TO 1 TAB BY MOUTH EVERY DAY FOR EDEMA AS DIRECTED  11  . glimepiride (AMARYL) 1 MG tablet Take 0.5 mg by mouth daily.    Marland Kitchen guaiFENesin-dextromethorphan (ROBITUSSIN DM) 100-10 MG/5ML syrup Take 5 mLs by mouth every 4 (four) hours as needed for cough (chest congestion). (Patient not taking: Reported on 10/15/2017) 118 mL 0  . levothyroxine (SYNTHROID, LEVOTHROID) 75 MCG tablet Take 75 mcg by mouth daily.    Marland Kitchen linaclotide (LINZESS) 72 MCG capsule Take 72 mcg by mouth daily before breakfast.    . lisinopril (PRINIVIL,ZESTRIL) 20 MG tablet Take 20 mg by mouth 2 (two) times daily.  6  . omeprazole (PRILOSEC) 20 MG capsule Take 20 mg by mouth daily.    . predniSONE (DELTASONE) 20 MG tablet Take 3 tablets (60 mg total) by mouth daily before breakfast. (Patient not taking: Reported on 10/15/2017) 15 tablet 0  . simvastatin (ZOCOR) 20 MG tablet Take 20 mg by mouth daily.  11  . TRADJENTA 5 MG TABS tablet Take 5 mg by mouth every evening.      No current facility-administered medications on file prior to visit.    Allergies  Allergen Reactions  .  Metformin And Related Other (See Comments)     Objective: General: Patient is awake, alert, and oriented x 3 and in no acute distress.  Integument: Skin is warm, dry and supple bilateral. Nails are tender, long, thickened and dystrophic with subungual debris, consistent with onychomycosis, 1-5 bilateral. No signs of infection. No open lesions or preulcerative lesions present bilateral. Remaining integument unremarkable.  Vasculature:  Dorsalis Pedis pulse 1/4 bilateral. Posterior Tibial pulse  1/4 bilateral.  Capillary fill time <3 sec 1-5 bilateral. Scant hair  growth to the level of the digits.Temperature gradient within normal limits. Mild varicosities present bilateral. Trace edema present bilateral.   Neurology: The patient has diminished sensation measured with a 5.07/10g Semmes Weinstein Monofilament at all pedal sites bilateral. Vibratory sensation decreased bilateral with tuning fork. No Babinski sign present bilateral.   Musculoskeletal: Asymptomatic hammertoe, bunion, pes planus pedal deformities noted bilateral. Muscular strength 5/5 in all lower extremity muscular groups bilateral without pain on range of motion. No tenderness with calf compression bilateral.  Assessment and Plan: Problem List Items Addressed This Visit    None    Visit Diagnoses    Dermatophytosis of nail    -  Primary   Diabetic polyneuropathy associated with type 2 diabetes mellitus (HCC)       Foot pain, bilateral          -Examined patient. -Discussed and educated patient on diabetic foot care, especially with regards to the vascular, neurological and musculoskeletal systems.  -Stressed the importance of good glycemic control and the detriment of not controlling glucose levels in relation to the foot. -Mechanically debrided all nails 1-5 bilateral using sterile nail nipper and filed with dremel without incident  -Recommend good supportive shoes for foot type -Recommend topicals for neuropathy -Recommend to cont with ortho follow up for knee -Answered all patient questions -Patient to return  in 3 months for at risk foot care -Patient advised to call the office if any problems or questions arise in the meantime.  Amber Gray, DPM

## 2017-11-08 DIAGNOSIS — E7849 Other hyperlipidemia: Secondary | ICD-10-CM | POA: Diagnosis not present

## 2017-11-08 DIAGNOSIS — E038 Other specified hypothyroidism: Secondary | ICD-10-CM | POA: Diagnosis not present

## 2017-11-08 DIAGNOSIS — I10 Essential (primary) hypertension: Secondary | ICD-10-CM | POA: Diagnosis not present

## 2017-11-08 DIAGNOSIS — E1122 Type 2 diabetes mellitus with diabetic chronic kidney disease: Secondary | ICD-10-CM | POA: Diagnosis not present

## 2017-11-28 DIAGNOSIS — I129 Hypertensive chronic kidney disease with stage 1 through stage 4 chronic kidney disease, or unspecified chronic kidney disease: Secondary | ICD-10-CM | POA: Diagnosis not present

## 2017-11-28 DIAGNOSIS — E1140 Type 2 diabetes mellitus with diabetic neuropathy, unspecified: Secondary | ICD-10-CM | POA: Diagnosis not present

## 2017-11-28 DIAGNOSIS — E1122 Type 2 diabetes mellitus with diabetic chronic kidney disease: Secondary | ICD-10-CM | POA: Diagnosis not present

## 2017-11-28 DIAGNOSIS — R9431 Abnormal electrocardiogram [ECG] [EKG]: Secondary | ICD-10-CM | POA: Diagnosis not present

## 2017-11-28 DIAGNOSIS — Z0000 Encounter for general adult medical examination without abnormal findings: Secondary | ICD-10-CM | POA: Diagnosis not present

## 2017-11-28 DIAGNOSIS — R609 Edema, unspecified: Secondary | ICD-10-CM | POA: Diagnosis not present

## 2017-11-28 DIAGNOSIS — N183 Chronic kidney disease, stage 3 (moderate): Secondary | ICD-10-CM | POA: Diagnosis not present

## 2017-11-28 DIAGNOSIS — E663 Overweight: Secondary | ICD-10-CM | POA: Diagnosis not present

## 2017-11-28 DIAGNOSIS — R82998 Other abnormal findings in urine: Secondary | ICD-10-CM | POA: Diagnosis not present

## 2017-11-28 DIAGNOSIS — E7849 Other hyperlipidemia: Secondary | ICD-10-CM | POA: Diagnosis not present

## 2017-12-10 DIAGNOSIS — Z1212 Encounter for screening for malignant neoplasm of rectum: Secondary | ICD-10-CM | POA: Diagnosis not present

## 2018-01-02 ENCOUNTER — Encounter (INDEPENDENT_AMBULATORY_CARE_PROVIDER_SITE_OTHER): Payer: Self-pay | Admitting: Surgery

## 2018-01-02 ENCOUNTER — Ambulatory Visit (INDEPENDENT_AMBULATORY_CARE_PROVIDER_SITE_OTHER): Payer: Medicare HMO | Admitting: Surgery

## 2018-01-02 VITALS — BP 117/66 | HR 62 | Ht 60.0 in | Wt 140.0 lb

## 2018-01-02 DIAGNOSIS — G8929 Other chronic pain: Secondary | ICD-10-CM

## 2018-01-02 DIAGNOSIS — M25561 Pain in right knee: Secondary | ICD-10-CM

## 2018-01-02 NOTE — Progress Notes (Signed)
Office Visit Note   Patient: Amber Gray           Date of Birth: Apr 27, 1941           MRN: [id number] Visit Date: 01/02/2018              Requested by: [name], [place], [place] [address] PCP: [name], MD   Assessment & Plan: Visit Diagnoses:  1. Mechanical knee pain, right   2. Chronic pain of right knee     Plan: With patient's ongoing knee pain with mechanical symptoms I will schedule MRI to evaluate the extent of her DJD and rule out large meniscal tear.  Follow-up with Dr. Lorin Mercy after completion to discuss results.  I want to see if she is a candidate for possible outpatient arthroscopy versus total knee replacement.  Follow-Up Instructions: Return in about 3 weeks (around 01/23/2018) for with yates to review mri right knee.   Orders:  Orders Placed This Encounter  Procedures  . MR Knee Right w/o contrast   No orders of the defined types were placed in this encounter.     Procedures: No procedures performed   Clinical Data: No additional findings.   Subjective: Chief Complaint  Patient presents with  . Right Knee - Pain, Follow-up    HPI Patient returns with complaints of right knee pain mechanical symptoms.  She describes having ongoing knee pain with feeling of her knee locking and catching.  States that it buckled on her the other day.  Reviewed previous knee x-rays and these did not show significant bone-on-bone changes.  Still maintaining  joint space.  Had temporary improvement with previous intra-articular Marcaine/Depo-Medrol injection in January. Review of Systems No current cardiac pulmonary GI GU issues  Objective: Vital Signs: BP 117/66   Pulse 62   Ht 5' (1.524 m)   Wt 140 lb (63.5 kg)   BMI 27.34 kg/m   Physical Exam  Constitutional: She is oriented to person, place, and time. No distress.  HENT:  Head: Normocephalic and atraumatic.  Eyes: Pupils are equal, round, and reactive to light. EOM are normal.    Neck: Normal range of motion.  Pulmonary/Chest: No respiratory distress.  Musculoskeletal:  Gait is antalgic.  Right knee she does have 1-2+ effusion.  Medial greater than lateral joint line tenderness.  Minimal patellofemoral crepitus.  Positive McMurray's test.  Cruciate collateral ligaments are stable.  Calf nontender.  Neurovascular intact.  Neurological: She is alert and oriented to person, place, and time.  Skin: Skin is warm and dry.  Psychiatric: She has a normal mood and affect.    Ortho Exam  Specialty Comments:  No specialty comments available.  Imaging: No results found.   PMFS History: Patient Active Problem List   Diagnosis Date Noted  . 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 Sepsis due to pneumonia ([place]) 05/27/2017  . CKD (chronic kidney disease), stage III ([place]) 05/27/2017  . Diabetes mellitus type 2, uncontrolled ([place]) 05/27/2017  . Hypothyroid 05/27/2017  . Hypercholesteremia 05/27/2017  . Acute hypokalemia 05/27/2017  . HTN (hypertension) 05/27/2017  . Community acquired pneumonia of left lower lobe of lung ([place])   . Diabetes mellitus with complication ([place])   . Type 2 diabetes mellitus ([place]) 06/27/2015  . Hypertension 06/27/2015  . Hypothyroidism 06/27/2015  . Chest pain 06/27/2015  . Precordial chest pain    Past Medical History:  Diagnosis Date  . Arthritis    "all over my  body" (05/27/2017)  . Chronic kidney disease (CKD), stage III (moderate) (HCC)   . Chronic lower back pain   . Frequent UTI   . GERD (gastroesophageal reflux disease)   . Headache    "monthly" (05/27/2017)  . History of blood transfusion    "when I had my neck fusion"  . History of hiatal hernia   . Hypercholesteremia   . Hypertension   . Hypothyroid   . Pneumonia 2000s X 1; 05/27/2017  . Seasonal allergies   . Type II diabetes mellitus (HCC)     Family History  Problem Relation Age of Onset  . Alzheimer's disease Mother   . Heart attack Father   .  Heart disease Father   . Dwarfism Sister   . Colon cancer Neg Hx   . Colon polyps Neg Hx   . Esophageal cancer Neg Hx   . Rectal cancer Neg Hx   . Stomach cancer Neg Hx     Past Surgical History:  Procedure Laterality Date  . BACK SURGERY    . BREAST BIOPSY Left   . CATARACT EXTRACTION W/ INTRAOCULAR LENS  IMPLANT, BILATERAL Bilateral   . COLONOSCOPY  10/25/2005   normal  . DILATION AND CURETTAGE OF UTERUS    . POSTERIOR CERVICAL FUSION/FORAMINOTOMY     "fused 4,5,6"  . TONSILLECTOMY     Social History   Occupational History  . Not on file  Tobacco Use  . Smoking status: Former Smoker    Packs/day: 1.00    Years: 6.00    Pack years: 6.00    Types: Cigarettes    Last attempt to quit: 10/01/1964    Years since quitting: 53.2  . Smokeless tobacco: Never Used  Substance and Sexual Activity  . Alcohol use: No  . Drug use: No  . Sexual activity: Never

## 2018-01-24 ENCOUNTER — Ambulatory Visit (INDEPENDENT_AMBULATORY_CARE_PROVIDER_SITE_OTHER): Payer: Medicare HMO | Admitting: Orthopaedic Surgery

## 2018-01-28 ENCOUNTER — Ambulatory Visit: Payer: Medicare HMO | Admitting: Sports Medicine

## 2018-01-28 ENCOUNTER — Ambulatory Visit: Payer: Medicare HMO | Admitting: Podiatry

## 2018-01-28 ENCOUNTER — Encounter: Payer: Self-pay | Admitting: Podiatry

## 2018-01-28 DIAGNOSIS — B351 Tinea unguium: Secondary | ICD-10-CM | POA: Diagnosis not present

## 2018-01-28 DIAGNOSIS — E1142 Type 2 diabetes mellitus with diabetic polyneuropathy: Secondary | ICD-10-CM

## 2018-01-28 DIAGNOSIS — M2142 Flat foot [pes planus] (acquired), left foot: Secondary | ICD-10-CM

## 2018-01-28 DIAGNOSIS — M21619 Bunion of unspecified foot: Secondary | ICD-10-CM

## 2018-01-28 DIAGNOSIS — M2141 Flat foot [pes planus] (acquired), right foot: Secondary | ICD-10-CM

## 2018-01-28 NOTE — Progress Notes (Signed)
Complaint:  Visit Type: Patient returns to my office for continued preventative foot care services. Complaint: Patient states" my nails have grown long and thick and become painful to walk and wear shoes" Patient has been diagnosed with DM with no foot complications. The patient presents for preventative foot care services. No changes to ROS  Podiatric Exam: Vascular: dorsalis pedis and posterior tibial pulses are palpable bilateral. Capillary return is immediate. Temperature gradient is WNL. Skin turgor WNL  Sensorium: Normal Semmes Weinstein monofilament test. Normal tactile sensation bilaterally. Nail Exam: Pt has thick disfigured discolored nails with subungual debris noted bilateral entire nail hallux through fifth toenails Ulcer Exam: There is no evidence of ulcer or pre-ulcerative changes or infection. Orthopedic Exam: Muscle tone and strength are WNL. No limitations in general ROM. No crepitus or effusions noted. HAV  B/L.  Pes planus Skin: No Porokeratosis. No infection or ulcers  Diagnosis:  Onychomycosis, , Pain in right toe, pain in left toes  Treatment & Plan Procedures and Treatment: Consent by patient was obtained for treatment procedures.   Debridement of mycotic and hypertrophic toenails, 1 through 5 bilateral and clearing of subungual debris. No ulceration, no infection noted.  Return Visit-Office Procedure: Patient instructed to return to the office for a follow up visit 3 months for continued evaluation and treatment.    Gardiner Barefoot DPM

## 2018-02-01 ENCOUNTER — Ambulatory Visit
Admission: RE | Admit: 2018-02-01 | Discharge: 2018-02-01 | Disposition: A | Discharge status: Home / Self Care | Payer: Medicare HMO | Source: Ambulatory Visit | Attending: Surgery | Admitting: Surgery

## 2018-02-01 DIAGNOSIS — G8929 Other chronic pain: Secondary | ICD-10-CM

## 2018-02-01 DIAGNOSIS — M25561 Pain in right knee: Principal | ICD-10-CM

## 2018-02-05 ENCOUNTER — Ambulatory Visit (INDEPENDENT_AMBULATORY_CARE_PROVIDER_SITE_OTHER): Payer: Medicare HMO | Admitting: Orthopaedic Surgery

## 2018-02-05 ENCOUNTER — Encounter (INDEPENDENT_AMBULATORY_CARE_PROVIDER_SITE_OTHER): Payer: Self-pay | Admitting: Orthopaedic Surgery

## 2018-02-05 VITALS — BP 129/73 | HR 58 | Ht 60.0 in | Wt 140.0 lb

## 2018-02-05 DIAGNOSIS — M25561 Pain in right knee: Secondary | ICD-10-CM

## 2018-02-05 DIAGNOSIS — M659 Synovitis and tenosynovitis, unspecified: Secondary | ICD-10-CM

## 2018-02-05 DIAGNOSIS — M65861 Other synovitis and tenosynovitis, right lower leg: Secondary | ICD-10-CM | POA: Diagnosis not present

## 2018-02-05 MED ORDER — LIDOCAINE HCL 1 % IJ SOLN
0.5000 mL | INTRAMUSCULAR | Status: AC | PRN
  Administered 2018-02-05: .5 mL

## 2018-02-05 MED ORDER — BUPIVACAINE HCL 0.25 % IJ SOLN
4.0000 mL | INTRAMUSCULAR | Status: AC | PRN
  Administered 2018-02-05: 4 mL via INTRA_ARTICULAR

## 2018-02-05 MED ORDER — METHYLPREDNISOLONE ACETATE 40 MG/ML IJ SUSP
40.0000 mg | INTRAMUSCULAR | Status: AC | PRN
  Administered 2018-02-05: 40 mg via INTRA_ARTICULAR

## 2018-02-05 NOTE — Progress Notes (Signed)
Office Visit Note   Patient: Amber Gray           Date of Birth: April 10, 1941           MRN: [id number] Visit Date: 02/05/2018              Requested by: [name], [place],  [address] PCP: [name], MD   Assessment & Plan: Visit Diagnoses:  1. Synovitis of knee     Plan: Patient tolerated the injection in her knee.  She will call if she has persistent problems.  MRI results were reviewed as well as plain radiographs.  Follow-Up Instructions: Return if symptoms worsen or fail to improve.   Orders:  Orders Placed This Encounter  Procedures  . Large Joint Inj: R knee   No orders of the defined types were placed in this encounter.     Procedures: Large Joint Inj: R knee on 02/05/2018 11:01 AM Indications: pain and joint swelling Details: 22 G 1.5 in needle, anterolateral approach  Arthrogram: No  Medications: 40 mg methylPREDNISolone acetate 40 MG/ML; 0.5 mL lidocaine 1 %; 4 mL bupivacaine 0.25 % Outcome: tolerated well, no immediate complications Procedure, treatment alternatives, risks and benefits explained, specific risks discussed. Consent was given by the patient. Immediately prior to procedure a time out was called to verify the correct patient, procedure, equipment, support staff and site/side marked as required. Patient was prepped and draped in the usual sterile fashion.       Clinical Data: No additional findings.   Subjective: Chief Complaint  Patient presents with  . Right Knee - Pain, Follow-up    MRI review    HPI 77 year old female returns with ongoing problems with the right knee.  She states she still has problems with stairs going from sitting to standing.  She is used ibuprofen and also Aleve with some improvement.  MRI scan has been obtained and is available for review.  She denies locking.  She has more discomfort medial and also adjacent to the patella.  No fever or chills denies groin pain no associated  back pain.  Review of Systems updated unchanged from last office visit.  Of note his diabetes and kidney disease.   Objective: Vital Signs: BP 129/73   Pulse (!) 58   Ht 5' (1.524 m)   Wt 140 lb (63.5 kg)   BMI 27.34 kg/m   Physical Exam  Constitutional: She is oriented to person, place, and time. She appears well-developed.  HENT:  Head: Normocephalic.  Right Ear: External ear normal.  Left Ear: External ear normal.  Eyes: Pupils are equal, round, and reactive to light.  Neck: No tracheal deviation present. No thyromegaly present.  Cardiovascular: Normal rate.  Pulmonary/Chest: Effort normal.  Abdominal: Soft.  Neurological: She is alert and oriented to person, place, and time.  Skin: Skin is warm and dry.  Psychiatric: She has a normal mood and affect. Her behavior is normal.    Ortho Exam patient has a medial joint line pain some crepitus with knee extension.  No pain with hyperextension ACL PCL is normal.  Distal pulses are intact no pain with hip range of motion.  Pain with patellofemoral loading negative apprehension and negative patellar subluxation.  2+ synovitis is not  Specialty Comments:  No specialty comments available.  Imaging: CLINICAL DATA:  Global right knee pain and swelling for 1 year. No specific injury or prior relevant surgery. Diabetes.  EXAM: MRI OF THE RIGHT KNEE  WITHOUT CONTRAST  TECHNIQUE: Multiplanar, multisequence MR imaging of the knee was performed. No intravenous contrast was administered.  COMPARISON:  Knee radiograph 04/09/2017.  FINDINGS: MENISCI  Medial meniscus:  Intact with normal morphology.  Lateral meniscus:  Intact with normal morphology.  LIGAMENTS  Cruciates:  Intact.  Collaterals:  Intact.  CARTILAGE  Patellofemoral: Mild patellar chondral thinning with minimal subchondral cyst formation near the apex.  Medial:  Preserved.  Lateral:  Preserved.  MISCELLANEOUS  Joint:  Minimal joint  fluid.  No evidence of loose body.  Popliteal Fossa:  Unremarkable. No significant Baker's cyst.  Extensor Mechanism:  Intact.  Prominent patellar spurring.  Bones:  No acute or significant extra-articular osseous findings.  Other: Mild prepatellar subcutaneous edema.  IMPRESSION: 1. No acute findings or evidence of internal derangement. The menisci, cruciate and collateral ligaments are intact. 2. Mild patellar chondromalacia.  No acute osseous findings. 3. Prominent patellar spurring.   Electronically Signed   By: Richardean Sale M.D.   On: 02/02/2018 17:29   PMFS History: Patient Active Problem List   Diagnosis Date Noted  . Synovitis of knee 02/05/2018  . 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 Sepsis due to pneumonia ([place]) 05/27/2017  . CKD (chronic kidney disease), stage III ([place]) 05/27/2017  . Diabetes mellitus type 2, uncontrolled ([place]) 05/27/2017  . Hypothyroid 05/27/2017  . Hypercholesteremia 05/27/2017  . Acute hypokalemia 05/27/2017  . HTN (hypertension) 05/27/2017  . Community acquired pneumonia of left lower lobe of lung ([place])   . Diabetes mellitus with complication ([place])   . Type 2 diabetes mellitus ([place]) 06/27/2015  . Hypertension 06/27/2015  . Hypothyroidism 06/27/2015  . Chest pain 06/27/2015  . Precordial chest pain    Past Medical History:  Diagnosis Date  . Arthritis    "all over my body" (05/27/2017)  . Chronic kidney disease (CKD), stage III (moderate) (HCC)   . Chronic lower back pain   . Frequent UTI   . GERD (gastroesophageal reflux disease)   . Headache    "monthly" (05/27/2017)  . History of blood transfusion    "when I had my neck fusion"  . History of hiatal hernia   . Hypercholesteremia   . Hypertension   . Hypothyroid   . Pneumonia 2000s X 1; 05/27/2017  . Seasonal allergies   . Type II diabetes mellitus (HCC)     Family History  Problem Relation Age of Onset  . Alzheimer's disease Mother   . Heart  attack Father   . Heart disease Father   . Dwarfism Sister   . Colon cancer Neg Hx   . Colon polyps Neg Hx   . Esophageal cancer Neg Hx   . Rectal cancer Neg Hx   . Stomach cancer Neg Hx     Past Surgical History:  Procedure Laterality Date  . BACK SURGERY    . BREAST BIOPSY Left   . CATARACT EXTRACTION W/ INTRAOCULAR LENS  IMPLANT, BILATERAL Bilateral   . COLONOSCOPY  10/25/2005   normal  . DILATION AND CURETTAGE OF UTERUS    . POSTERIOR CERVICAL FUSION/FORAMINOTOMY     "fused 4,5,6"  . TONSILLECTOMY     Social History   Occupational History  . Not on file  Tobacco Use  . Smoking status: Former Smoker    Packs/day: 1.00    Years: 6.00    Pack years: 6.00    Types: Cigarettes    Last attempt to quit: 10/01/1964    Years since quitting: 53.3  .  Smokeless tobacco: Never Used  Substance and Sexual Activity  . Alcohol use: No  . Drug use: No  . Sexual activity: Never

## 2018-03-11 DIAGNOSIS — E663 Overweight: Secondary | ICD-10-CM | POA: Diagnosis not present

## 2018-03-11 DIAGNOSIS — E1122 Type 2 diabetes mellitus with diabetic chronic kidney disease: Secondary | ICD-10-CM | POA: Diagnosis not present

## 2018-03-11 DIAGNOSIS — I129 Hypertensive chronic kidney disease with stage 1 through stage 4 chronic kidney disease, or unspecified chronic kidney disease: Secondary | ICD-10-CM | POA: Diagnosis not present

## 2018-03-11 DIAGNOSIS — R627 Adult failure to thrive: Secondary | ICD-10-CM | POA: Diagnosis not present

## 2018-03-11 DIAGNOSIS — E038 Other specified hypothyroidism: Secondary | ICD-10-CM | POA: Diagnosis not present

## 2018-03-11 DIAGNOSIS — Z6828 Body mass index (BMI) 28.0-28.9, adult: Secondary | ICD-10-CM | POA: Diagnosis not present

## 2018-03-11 DIAGNOSIS — N183 Chronic kidney disease, stage 3 (moderate): Secondary | ICD-10-CM | POA: Diagnosis not present

## 2018-03-11 DIAGNOSIS — M1990 Unspecified osteoarthritis, unspecified site: Secondary | ICD-10-CM | POA: Diagnosis not present

## 2018-04-22 ENCOUNTER — Encounter (INDEPENDENT_AMBULATORY_CARE_PROVIDER_SITE_OTHER): Payer: Self-pay | Admitting: Orthopaedic Surgery

## 2018-04-22 ENCOUNTER — Ambulatory Visit (INDEPENDENT_AMBULATORY_CARE_PROVIDER_SITE_OTHER): Payer: 59 | Admitting: Orthopaedic Surgery

## 2018-04-22 VITALS — BP 120/74 | HR 68 | Ht 60.0 in | Wt 140.0 lb

## 2018-04-22 DIAGNOSIS — M65969 Unspecified synovitis and tenosynovitis, unspecified lower leg: Secondary | ICD-10-CM

## 2018-04-22 DIAGNOSIS — M659 Synovitis and tenosynovitis, unspecified: Secondary | ICD-10-CM | POA: Diagnosis not present

## 2018-04-22 DIAGNOSIS — N183 Chronic kidney disease, stage 3 (moderate): Secondary | ICD-10-CM | POA: Diagnosis not present

## 2018-04-22 DIAGNOSIS — E1122 Type 2 diabetes mellitus with diabetic chronic kidney disease: Secondary | ICD-10-CM | POA: Diagnosis not present

## 2018-04-22 NOTE — Progress Notes (Signed)
Office Visit Note   Patient: Amber Gray           Date of Birth: Jan 12, 1941           MRN: [id number] Visit Date: 04/22/2018              Requested by: [name], [place], [place] [address] PCP: [name], MD   Assessment & Plan: Visit Diagnoses:  1. Synovitis of knee     Plan: She can return if she has increased problems with her knee.  We discussed daily walking straight leg raising isometric quad exercises.  Continue use of a compressive knee sleeve.  Avoiding anti-inflammatories due to her kidney disease.  If she has increased problems she will return.  Follow-Up Instructions: Return if symptoms worsen or fail to improve.   Orders:  No orders of the defined types were placed in this encounter.  No orders of the defined types were placed in this encounter.     Procedures: No procedures performed   Clinical Data: No additional findings.   Subjective: Chief Complaint  Patient presents with  . Right Knee - Pain, Follow-up    HPI 77 year old female returns with ongoing problems with the right knee she had an injection in May and she states this is been effective in giving her pain relief.  She is walking better she states she can go about 3 blocks she uses a rolling walker to help with balance.  Recently 1 of her medications was discontinued .  She has been using a crypt compressive knee sleeve.  She is avoided anti-inflammatories due to his stage III kidney disease. Review of Systems positive for hypertension, diabetes, hypothyroidism, previous pneumonia, increased cholesterol.  Patellar chondromalacia.  Otherwise negative as it pertains HPI.  14 point review of systems updated.   Objective: Vital Signs: BP 120/74   Pulse 68   Ht 5' (1.524 m)   Wt 140 lb (63.5 kg)   BMI 27.34 kg/m   Physical Exam  Constitutional: She is oriented to person, place, and time. She appears well-developed.  HENT:  Head: Normocephalic.  Right Ear:  External ear normal.  Left Ear: External ear normal.  Eyes: Pupils are equal, round, and reactive to light.  Neck: No tracheal deviation present. No thyromegaly present.  Cardiovascular: Normal rate.  Pulmonary/Chest: Effort normal.  Abdominal: Soft.  Neurological: She is alert and oriented to person, place, and time.  Skin: Skin is warm and dry.  Psychiatric: She has a normal mood and affect. Her behavior is normal.    Ortho Exam patient has some crepitus with knee extension.  Minimal effusion no pain with hip range of motion no sciatic notch tenderness.  No rash over exposed skin plantar feet shows no ulcerations.   Specialty Comments:  No specialty comments available.  Imaging: No results found.   PMFS History: Patient Active Problem List   Diagnosis Date Noted  . Synovitis of knee 02/05/2018  . 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 Sepsis due to pneumonia ([place]) 05/27/2017  . CKD (chronic kidney disease), stage III ([place]) 05/27/2017  . Diabetes mellitus type 2, uncontrolled ([place]) 05/27/2017  . Hypothyroid 05/27/2017  . Hypercholesteremia 05/27/2017  . Acute hypokalemia 05/27/2017  . HTN (hypertension) 05/27/2017  . Community acquired pneumonia of left lower lobe of lung ([place])   . Diabetes mellitus with complication ([place])   . Type 2 diabetes mellitus ([place]) 06/27/2015  . Hypertension 06/27/2015  . Hypothyroidism 06/27/2015  .  Chest pain 06/27/2015  . Precordial chest pain    Past Medical History:  Diagnosis Date  . Arthritis    "all over my body" (05/27/2017)  . Chronic kidney disease (CKD), stage III (moderate) (HCC)   . Chronic lower back pain   . Frequent UTI   . GERD (gastroesophageal reflux disease)   . Headache    "monthly" (05/27/2017)  . History of blood transfusion    "when I had my neck fusion"  . History of hiatal hernia   . Hypercholesteremia   . Hypertension   . Hypothyroid   . Pneumonia 2000s X 1; 05/27/2017  . Seasonal allergies     . Type II diabetes mellitus (HCC)     Family History  Problem Relation Age of Onset  . Alzheimer's disease Mother   . Heart attack Father   . Heart disease Father   . Dwarfism Sister   . Colon cancer Neg Hx   . Colon polyps Neg Hx   . Esophageal cancer Neg Hx   . Rectal cancer Neg Hx   . Stomach cancer Neg Hx     Past Surgical History:  Procedure Laterality Date  . BACK SURGERY    . BREAST BIOPSY Left   . CATARACT EXTRACTION W/ INTRAOCULAR LENS  IMPLANT, BILATERAL Bilateral   . COLONOSCOPY  10/25/2005   normal  . DILATION AND CURETTAGE OF UTERUS    . POSTERIOR CERVICAL FUSION/FORAMINOTOMY     "fused 4,5,6"  . TONSILLECTOMY     Social History   Occupational History  . Not on file  Tobacco Use  . Smoking status: Former Smoker    Packs/day: 1.00    Years: 6.00    Pack years: 6.00    Types: Cigarettes    Last attempt to quit: 10/01/1964    Years since quitting: 53.5  . Smokeless tobacco: Never Used  Substance and Sexual Activity  . Alcohol use: No  . Drug use: No  . Sexual activity: Never

## 2018-04-29 ENCOUNTER — Encounter: Payer: Self-pay | Admitting: Podiatry

## 2018-04-29 ENCOUNTER — Ambulatory Visit: Payer: 59 | Admitting: Podiatry

## 2018-04-29 DIAGNOSIS — M79674 Pain in right toe(s): Secondary | ICD-10-CM | POA: Diagnosis not present

## 2018-04-29 DIAGNOSIS — M79675 Pain in left toe(s): Secondary | ICD-10-CM | POA: Diagnosis not present

## 2018-04-29 DIAGNOSIS — B351 Tinea unguium: Secondary | ICD-10-CM

## 2018-04-29 DIAGNOSIS — E1142 Type 2 diabetes mellitus with diabetic polyneuropathy: Secondary | ICD-10-CM

## 2018-04-29 NOTE — Progress Notes (Signed)
Complaint:  Visit Type: Patient returns to my office for continued preventative foot care services. Complaint: Patient states" my nails have grown long and thick and become painful to walk and wear shoes" Patient has been diagnosed with DM with no foot complications. The patient presents for preventative foot care services. No changes to ROS  Podiatric Exam: Vascular: dorsalis pedis and posterior tibial pulses are palpable bilateral. Capillary return is immediate. Temperature gradient is WNL. Skin turgor WNL  Sensorium: Normal Semmes Weinstein monofilament test. Normal tactile sensation bilaterally. Nail Exam: Pt has thick disfigured discolored nails with subungual debris noted bilateral entire nail hallux through fifth toenails Ulcer Exam: There is no evidence of ulcer or pre-ulcerative changes or infection. Orthopedic Exam: Muscle tone and strength are WNL. No limitations in general ROM. No crepitus or effusions noted. HAV  B/L.  Pes planus Skin: No Porokeratosis. No infection or ulcers  Diagnosis:  Onychomycosis, , Pain in right toe, pain in left toes  Treatment & Plan Procedures and Treatment: Consent by patient was obtained for treatment procedures.   Debridement of mycotic and hypertrophic toenails, 1 through 5 bilateral and clearing of subungual debris. No ulceration, no infection noted.  Return Visit-Office Procedure: Patient instructed to return to the office for a follow up visit  10 weeks  for continued evaluation and treatment.    Meredyth Hornung DPM 

## 2018-05-05 DIAGNOSIS — H524 Presbyopia: Secondary | ICD-10-CM | POA: Diagnosis not present

## 2018-05-05 DIAGNOSIS — I10 Essential (primary) hypertension: Secondary | ICD-10-CM | POA: Diagnosis not present

## 2018-06-25 DIAGNOSIS — F419 Anxiety disorder, unspecified: Secondary | ICD-10-CM | POA: Diagnosis not present

## 2018-06-25 DIAGNOSIS — F329 Major depressive disorder, single episode, unspecified: Secondary | ICD-10-CM | POA: Diagnosis not present

## 2018-07-08 ENCOUNTER — Encounter: Payer: Self-pay | Admitting: Podiatry

## 2018-07-08 ENCOUNTER — Ambulatory Visit (INDEPENDENT_AMBULATORY_CARE_PROVIDER_SITE_OTHER): Payer: Medicare HMO | Admitting: Podiatry

## 2018-07-08 DIAGNOSIS — M79675 Pain in left toe(s): Secondary | ICD-10-CM | POA: Diagnosis not present

## 2018-07-08 DIAGNOSIS — B351 Tinea unguium: Secondary | ICD-10-CM | POA: Diagnosis not present

## 2018-07-08 DIAGNOSIS — M79674 Pain in right toe(s): Secondary | ICD-10-CM

## 2018-07-08 DIAGNOSIS — E1142 Type 2 diabetes mellitus with diabetic polyneuropathy: Secondary | ICD-10-CM | POA: Diagnosis not present

## 2018-07-08 DIAGNOSIS — M2142 Flat foot [pes planus] (acquired), left foot: Secondary | ICD-10-CM

## 2018-07-08 DIAGNOSIS — M2141 Flat foot [pes planus] (acquired), right foot: Secondary | ICD-10-CM

## 2018-07-08 NOTE — Progress Notes (Signed)
Complaint:  Visit Type: Patient returns to my office for continued preventative foot care services. Complaint: Patient states" my nails have grown long and thick and become painful to walk and wear shoes" Patient has been diagnosed with DM with no foot complications. The patient presents for preventative foot care services. No changes to ROS  Podiatric Exam: Vascular: dorsalis pedis and posterior tibial pulses are palpable bilateral. Capillary return is immediate. Temperature gradient is WNL. Skin turgor WNL  Sensorium: Normal Semmes Weinstein monofilament test. Normal tactile sensation bilaterally. Nail Exam: Pt has thick disfigured discolored nails with subungual debris noted bilateral entire nail hallux through fifth toenails Ulcer Exam: There is no evidence of ulcer or pre-ulcerative changes or infection. Orthopedic Exam: Muscle tone and strength are WNL. No limitations in general ROM. No crepitus or effusions noted. HAV  B/L.  Pes planus Skin: No Porokeratosis. No infection or ulcers  Diagnosis:  Onychomycosis, , Pain in right toe, pain in left toes  Treatment & Plan Procedures and Treatment: Consent by patient was obtained for treatment procedures.   Debridement of mycotic and hypertrophic toenails, 1 through 5 bilateral and clearing of subungual debris. No ulceration, no infection noted.  Return Visit-Office Procedure: Patient instructed to return to the office for a follow up visit  10 weeks  for continued evaluation and treatment.    Twinkle Sockwell DPM 

## 2018-07-18 DIAGNOSIS — F329 Major depressive disorder, single episode, unspecified: Secondary | ICD-10-CM | POA: Diagnosis not present

## 2018-07-21 ENCOUNTER — Encounter: Payer: Self-pay | Admitting: Physician Assistant

## 2018-07-21 ENCOUNTER — Ambulatory Visit (INDEPENDENT_AMBULATORY_CARE_PROVIDER_SITE_OTHER): Payer: Medicare HMO | Admitting: Physician Assistant

## 2018-07-21 ENCOUNTER — Ambulatory Visit (INDEPENDENT_AMBULATORY_CARE_PROVIDER_SITE_OTHER)
Admission: RE | Admit: 2018-07-21 | Discharge: 2018-07-21 | Disposition: A | Discharge status: Home / Self Care | Payer: Medicare HMO | Source: Ambulatory Visit | Attending: Physician Assistant | Admitting: Physician Assistant

## 2018-07-21 VITALS — BP 132/66 | HR 68 | Ht 60.0 in | Wt 152.0 lb

## 2018-07-21 DIAGNOSIS — R194 Change in bowel habit: Secondary | ICD-10-CM

## 2018-07-21 DIAGNOSIS — K5900 Constipation, unspecified: Secondary | ICD-10-CM | POA: Diagnosis not present

## 2018-07-21 NOTE — Progress Notes (Signed)
Chief Complaint: Change in bowel habits  HPI:    Amber Gray is a 77 year old female with a past medical history as listed below, follows with Dr. Silverio Decamp, presents to clinic today for complaint of change in bowel habits.    01/09/2017 colonoscopy with 2 4-6 mm polyps in the sigmoid colon and at the hepatic flexure removed with a cold snare.  Pathology revealed a sessile serrated polyp and a tubular adenoma.   Repeat was discussed in 5 years, the patient will be 79 at that time and it was recommended she not have a repeat unless needed for other reasons.    Today, the patient expresses that over the past year since being in the hospital, she has had a change in bowel habits towards "hard little marbles".  Patient tells me that this can occur 3-4 times a day.  Can also have some breakthrough watery/loose stool and tells me there is some urgency when this occurs, in fact she has not made it to the bathroom on 3 separate occasions.  Per her, has been tried on multiple laxatives including what sounds like Linzess and possibly Movantik by her PCP.  Explains that the "M one" worked but the [employer] was too strong for her.    Denies fever, chills, weight loss, anorexia, nausea, vomiting, abdominal pain, blood in her stool or symptoms that awaken her from sleep.  Past Medical History:  Diagnosis Date  . Arthritis    "all over my body" (05/27/2017)  . Chronic kidney disease (CKD), stage III (moderate) (HCC)   . Chronic lower back pain   . Frequent UTI   . GERD (gastroesophageal reflux disease)   . Headache    "monthly" (05/27/2017)  . History of blood transfusion    "when I had my neck fusion"  . History of hiatal hernia   . Hypercholesteremia   . Hypertension   . Hypothyroid   . Pneumonia 2000s X 1; 05/27/2017  . Seasonal allergies   . Type II diabetes mellitus ([place])     Past Surgical History:  Procedure Laterality Date  . BACK SURGERY    . BREAST BIOPSY Left   . CATARACT EXTRACTION W/  INTRAOCULAR LENS  IMPLANT, BILATERAL Bilateral   . COLONOSCOPY  10/25/2005   normal  . DILATION AND CURETTAGE OF UTERUS    . POSTERIOR CERVICAL FUSION/FORAMINOTOMY     "fused 4,5,6"  . TONSILLECTOMY      Current Outpatient Medications  Medication Sig Dispense Refill  . acetaminophen (TYLENOL) 325 MG tablet Take 650 mg by mouth every 6 (six) hours as needed for mild pain.    Marland Kitchen albuterol (PROVENTIL HFA;VENTOLIN HFA) 108 (90 BASE) MCG/ACT inhaler Inhale 2 puffs into the lungs every 6 (six) hours as needed. For wheezing.    Marland Kitchen ALPRAZolam (XANAX) 0.25 MG tablet Take 0.25 mg by mouth daily as needed.  0  . azithromycin (ZITHROMAX) 500 MG tablet Take 1 tablet (500 mg total) by mouth daily at 8 pm. 2 tablet 0  . benzonatate (TESSALON) 100 MG capsule Take 1 capsule (100 mg total) by mouth 3 (three) times daily as needed for cough. 20 capsule 0  . budesonide (PULMICORT) 0.25 MG/2ML nebulizer solution Take 2 mLs (0.25 mg total) by nebulization 2 (two) times daily. 60 mL 12  . Cefixime (SUPRAX) 400 MG CAPS capsule Take 1 capsule (400 mg total) by mouth daily. 2 capsule 0  . cephALEXin (KEFLEX) 500 MG capsule cephalexin 500 mg capsule  TAKE 1 CAPSULE(S)  3 TIMES A DAY BY ORAL ROUTE FOR 10 DAYS.    Marland Kitchen cetirizine (ZYRTEC) 10 MG tablet Take 10 mg by mouth daily.    . empagliflozin (JARDIANCE) 10 MG TABS tablet Take 10 mg by mouth daily.    . furosemide (LASIX) 40 MG tablet TAKE 1/2 TO 1 TAB BY MOUTH EVERY DAY FOR EDEMA AS DIRECTED  11  . glimepiride (AMARYL) 1 MG tablet Take 0.5 mg by mouth daily.    Marland Kitchen guaiFENesin-dextromethorphan (ROBITUSSIN DM) 100-10 MG/5ML syrup Take 5 mLs by mouth every 4 (four) hours as needed for cough (chest congestion). 118 mL 0  . levothyroxine (SYNTHROID, LEVOTHROID) 75 MCG tablet Take 75 mcg by mouth daily.    Marland Kitchen linaclotide (LINZESS) 72 MCG capsule Take 72 mcg by mouth daily before breakfast.    . lisinopril (PRINIVIL,ZESTRIL) 20 MG tablet Take 20 mg by mouth 2 (two) times daily.   6  . metroNIDAZOLE (FLAGYL) 500 MG tablet Take 500 mg by mouth 3 (three) times daily.  0  . omeprazole (PRILOSEC) 20 MG capsule Take 20 mg by mouth daily.    . predniSONE (DELTASONE) 20 MG tablet Take 3 tablets (60 mg total) by mouth daily before breakfast. 15 tablet 0  . sertraline (ZOLOFT) 50 MG tablet Take 50 mg by mouth daily.  0  . simvastatin (ZOCOR) 40 MG tablet Take 40 mg by mouth daily.  3  . TRADJENTA 5 MG TABS tablet Take 5 mg by mouth every evening.      No current facility-administered medications for this visit.     Allergies as of 07/21/2018 - Review Complete 07/08/2018  Allergen Reaction Noted  . Metformin and related Other (See Comments) 12/26/2016    Family History  Problem Relation Age of Onset  . Alzheimer's disease Mother   . Heart attack Father   . Heart disease Father   . Dwarfism Sister   . Colon cancer Neg Hx   . Colon polyps Neg Hx   . Esophageal cancer Neg Hx   . Rectal cancer Neg Hx   . Stomach cancer Neg Hx     Social History   Socioeconomic History  . Marital status: Divorced    Spouse name: Not on file  . Number of children: Not on file  . Years of education: Not on file  . Highest education level: Not on file  Occupational History  . Not on file  Social Needs  . Financial resource strain: Not on file  . Food insecurity:    Worry: Not on file    Inability: Not on file  . Transportation needs:    Medical: Not on file    Non-medical: Not on file  Tobacco Use  . Smoking status: Former Smoker    Packs/day: 1.00    Years: 6.00    Pack years: 6.00    Types: Cigarettes    Last attempt to quit: 10/01/1964    Years since quitting: 53.8  . Smokeless tobacco: Never Used  Substance and Sexual Activity  . Alcohol use: No  . Drug use: No  . Sexual activity: Never  Lifestyle  . Physical activity:    Days per week: Not on file    Minutes per session: Not on file  . Stress: Not on file  Relationships  . Social connections:    Talks on  phone: Not on file    Gets together: Not on file    Attends religious service: Not on file  Active member of club or organization: Not on file    Attends meetings of clubs or organizations: Not on file    Relationship status: Not on file  . Intimate partner violence:    Fear of current or ex partner: Not on file    Emotionally abused: Not on file    Physically abused: Not on file    Forced sexual activity: Not on file  Other Topics Concern  . Not on file  Social History Narrative  . Not on file    Review of Systems:    Constitutional: No weight loss, fever or chills Cardiovascular: No chest pain Respiratory: No SOB  Gastrointestinal: See HPI and otherwise negative   Physical Exam:  Vital signs: BP 132/66   Pulse 68   Ht 5' (1.524 m)   Wt 152 lb (68.9 kg)   BMI 29.69 kg/m   Constitutional:   Pleasant Elderly Caucasian female appears to be in NAD, Well developed, Well nourished, alert and cooperative Respiratory: Respirations even and unlabored. Lungs clear to auscultation bilaterally.   No wheezes, crackles, or rhonchi.  Cardiovascular: Normal S1, S2. No MRG. Regular rate and rhythm. No peripheral edema, cyanosis or pallor.  Gastrointestinal:  Soft, mild distension, nontender. No rebound or guarding. Normal bowel sounds. No appreciable masses or hepatomegaly. Rectal:  Not performed.  Msk:  Symmetrical without gross deformities. Without edema, no deformity or joint abnormality. +ambulated with a walker Psychiatric: Demonstrates good judgement and reason without abnormal affect or behaviors.  RELEVANT LABS AND IMAGING: CBC    Component Value Date/Time   WBC 15.1 (H) 05/30/2017 0415   RBC 3.97 05/30/2017 0415   HGB 11.3 (L) 05/30/2017 0415   HCT 37.1 05/30/2017 0415   PLT 206 05/30/2017 0415   MCV 93.5 05/30/2017 0415   MCH 28.5 05/30/2017 0415   MCHC 30.5 05/30/2017 0415   RDW 14.6 05/30/2017 0415   LYMPHSABS 0.9 05/30/2017 0415   MONOABS 0.5 05/30/2017 0415    EOSABS 0.0 05/30/2017 0415   BASOSABS 0.0 05/30/2017 0415    CMP     Component Value Date/Time   NA 141 05/31/2017 0413   K 3.7 05/31/2017 0413   CL 113 (H) 05/31/2017 0413   CO2 21 (L) 05/31/2017 0413   GLUCOSE 91 05/31/2017 0413   BUN 29 (H) 05/31/2017 0413   CREATININE 1.14 (H) 05/31/2017 0413   CALCIUM 8.7 (L) 05/31/2017 0413   PROT 6.3 (L) 05/28/2017 0425   ALBUMIN 3.0 (L) 05/28/2017 0425   AST 13 (L) 05/28/2017 0425   ALT 13 (L) 05/28/2017 0425   ALKPHOS 47 05/28/2017 0425   BILITOT 0.6 05/28/2017 0425   GFRNONAA 45 (L) 05/31/2017 0413   GFRAA 52 (L) 05/31/2017 0413    Assessment: 1.  Change in bowel habits: Describes hard marbles over the past year, apparently has been on 2 separate laxatives by her PCP, not exactly sure what these are though they sound like Linzess and Movantik, apparently the "M one" worked fairly well, denies use of other laxatives in the past, some associated abdominal distention but no other complications; sounds like constipation with some episodes of overflow  Plan: 1.  Ordered a KUB to confirm constipation today. 2.  Recommend the patient start MiraLAX twice daily, discussed that she can titrate this up to 4 times a day or down to once daily.  If this does not work for her we can try a stronger laxative such as possibly Amitiza or Movantik. 3.  Patient tells  me she is going to see her PCP in about a week. 4.  Patient to follow in clinic with me in 4-6 weeks or sooner if necessary.  Ellouise Newer, PA-C [facility] 07/21/2018, 10:01 AM  Cc: [name], MD

## 2018-07-21 NOTE — Patient Instructions (Signed)
If you are age 78 or older, your body mass index should be between 23-30. Your Body mass index is 29.69 kg/m. If this is out of the aforementioned range listed, please consider follow up with your Primary Care Provider.  If you are age 35 or younger, your body mass index should be between 19-25. Your Body mass index is 29.69 kg/m. If this is out of the aformentioned range listed, please consider follow up with your Primary Care Provider.   Your provider has requested that you go to the basement level for an x-ray before leaving today. Press "B" on the elevator. The lab is located at the first door on the left as you exit the elevator.  Start Miralax twice daily. (over-the-counter)  Follow up with me on August 20, 2018 at 10:15 am.  Thank you for choosing me and [facility].   Ellouise Newer, PA-C

## 2018-07-28 DIAGNOSIS — K5900 Constipation, unspecified: Secondary | ICD-10-CM | POA: Diagnosis not present

## 2018-07-28 DIAGNOSIS — E1122 Type 2 diabetes mellitus with diabetic chronic kidney disease: Secondary | ICD-10-CM | POA: Diagnosis not present

## 2018-07-28 DIAGNOSIS — F3289 Other specified depressive episodes: Secondary | ICD-10-CM | POA: Diagnosis not present

## 2018-07-28 DIAGNOSIS — Z23 Encounter for immunization: Secondary | ICD-10-CM | POA: Diagnosis not present

## 2018-07-28 DIAGNOSIS — R627 Adult failure to thrive: Secondary | ICD-10-CM | POA: Diagnosis not present

## 2018-07-28 DIAGNOSIS — I129 Hypertensive chronic kidney disease with stage 1 through stage 4 chronic kidney disease, or unspecified chronic kidney disease: Secondary | ICD-10-CM | POA: Diagnosis not present

## 2018-07-28 DIAGNOSIS — N183 Chronic kidney disease, stage 3 (moderate): Secondary | ICD-10-CM | POA: Diagnosis not present

## 2018-07-28 DIAGNOSIS — E038 Other specified hypothyroidism: Secondary | ICD-10-CM | POA: Diagnosis not present

## 2018-07-28 DIAGNOSIS — H539 Unspecified visual disturbance: Secondary | ICD-10-CM | POA: Diagnosis not present

## 2018-07-28 DIAGNOSIS — E119 Type 2 diabetes mellitus without complications: Secondary | ICD-10-CM | POA: Diagnosis not present

## 2018-08-11 NOTE — Progress Notes (Signed)
Reviewed and agree with documentation and assessment and plan. K. Veena  , MD   

## 2018-08-15 DIAGNOSIS — F329 Major depressive disorder, single episode, unspecified: Secondary | ICD-10-CM | POA: Diagnosis not present

## 2018-08-20 ENCOUNTER — Ambulatory Visit: Payer: Self-pay | Admitting: Physician Assistant

## 2018-09-03 ENCOUNTER — Encounter (INDEPENDENT_AMBULATORY_CARE_PROVIDER_SITE_OTHER): Payer: Self-pay | Admitting: Orthopaedic Surgery

## 2018-09-03 ENCOUNTER — Ambulatory Visit (INDEPENDENT_AMBULATORY_CARE_PROVIDER_SITE_OTHER): Payer: Medicare HMO | Admitting: Orthopaedic Surgery

## 2018-09-03 VITALS — BP 125/65 | HR 57 | Ht 60.0 in | Wt 152.0 lb

## 2018-09-03 DIAGNOSIS — M2241 Chondromalacia patellae, right knee: Secondary | ICD-10-CM | POA: Diagnosis not present

## 2018-09-03 NOTE — Progress Notes (Signed)
Office Visit Note   Patient: Amber Gray           Date of Birth: 1941/02/17           MRN: [id number] Visit Date: 09/03/2018              Requested by: [name], [address] [place], [place] [address] PCP: [name], MD   Assessment & Plan: Visit Diagnoses:  1. Chondromalacia patellae, right knee     Plan: Patient can continue doing some straight leg raising.  She will watch her diet carefully with her diabetes.  Findings of her MRI in May was reviewed with her again today in office follow-up PRN.  Follow-Up Instructions: No follow-ups on file.   Orders:  No orders of the defined types were placed in this encounter.  No orders of the defined types were placed in this encounter.     Procedures: No procedures performed   Clinical Data: No additional findings.   Subjective: Chief Complaint  Patient presents with  . Right Knee - Follow-up    HPI 77 year old female returns and states the injection in her knee in May gave her good improvement.  She is been using a knee sleeve off and on when she does her chores which tends to help.  Previous MRI in May showed some mild chondromalacia otherwise totally normal findings.  Patient does have diabetes.  She is using a rolling walker with reverse seat.  Review of Systems 14 point update unchanged from 04/22/2018.  She was in the hospital with pneumonia in May but responded to treatment.  Stage III kidney disease she avoids anti-inflammatories.  Objective: Vital Signs: BP 125/65   Pulse (!) 57   Ht 5' (1.524 m)   Wt 152 lb (68.9 kg)   BMI 29.69 kg/m   Physical Exam  Constitutional: She is oriented to person, place, and time. She appears well-developed.  HENT:  Head: Normocephalic.  Right Ear: External ear normal.  Left Ear: External ear normal.  Eyes: Pupils are equal, round, and reactive to light.  Neck: No tracheal deviation present. No thyromegaly present.  Cardiovascular: Normal rate.    Pulmonary/Chest: Effort normal.  Abdominal: Soft.  Neurological: She is alert and oriented to person, place, and time.  Skin: Skin is warm and dry.  Psychiatric: She has a normal mood and affect. Her behavior is normal.    Ortho Exam there is minimal crepitus with knee extension no knee effusion collateral crucial ligament exam is normal.  Pes bursa is normal hamstrings are normal no Baker's cyst distal pulses are palpable no pain with hip range of motion.  No sciatic notch tenderness no pitting edema.  Distal pulses palpable.  Specialty Comments:  No specialty comments available.  Imaging: No results found.   PMFS History: Patient Active Problem List   Diagnosis Date Noted  . Synovitis of knee 02/05/2018  . 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 Sepsis due to pneumonia ([place]) 05/27/2017  . CKD (chronic kidney disease), stage III ([place]) 05/27/2017  . Diabetes mellitus type 2, uncontrolled ([place]) 05/27/2017  . Hypothyroid 05/27/2017  . Hypercholesteremia 05/27/2017  . Acute hypokalemia 05/27/2017  . HTN (hypertension) 05/27/2017  . Community acquired pneumonia of left lower lobe of lung ([place])   . Diabetes mellitus with complication ([place])   . Type 2 diabetes mellitus ([place]) 06/27/2015  . Hypertension 06/27/2015  . Hypothyroidism 06/27/2015  . Chest pain 06/27/2015  . Precordial chest pain  Past Medical History:  Diagnosis Date  . Arthritis    "all over my body" (05/27/2017)  . Chronic kidney disease (CKD), stage III (moderate) (HCC)   . Chronic lower back pain   . Frequent UTI   . GERD (gastroesophageal reflux disease)   . Headache    "monthly" (05/27/2017)  . History of blood transfusion    "when I had my neck fusion"  . History of hiatal hernia   . Hypercholesteremia   . Hypertension   . Hypothyroid   . Pneumonia 2000s X 1; 05/27/2017  . Seasonal allergies   . Type II diabetes mellitus (HCC)     Family History  Problem Relation Age of Onset  .  Alzheimer's disease Mother   . Heart attack Father   . Heart disease Father   . Dwarfism Sister   . Colon cancer Neg Hx   . Colon polyps Neg Hx   . Esophageal cancer Neg Hx   . Rectal cancer Neg Hx   . Stomach cancer Neg Hx     Past Surgical History:  Procedure Laterality Date  . BACK SURGERY    . BREAST BIOPSY Left   . CATARACT EXTRACTION W/ INTRAOCULAR LENS  IMPLANT, BILATERAL Bilateral   . COLONOSCOPY  10/25/2005   normal  . DILATION AND CURETTAGE OF UTERUS    . POSTERIOR CERVICAL FUSION/FORAMINOTOMY     "fused 4,5,6"  . TONSILLECTOMY     Social History   Occupational History  . Not on file  Tobacco Use  . Smoking status: Former Smoker    Packs/day: 1.00    Years: 6.00    Pack years: 6.00    Types: Cigarettes    Last attempt to quit: 10/01/1964    Years since quitting: 53.9  . Smokeless tobacco: Never Used  Substance and Sexual Activity  . Alcohol use: No  . Drug use: No  . Sexual activity: Never

## 2018-09-05 ENCOUNTER — Ambulatory Visit (INDEPENDENT_AMBULATORY_CARE_PROVIDER_SITE_OTHER): Payer: Medicare HMO | Admitting: Physician Assistant

## 2018-09-05 ENCOUNTER — Encounter: Payer: Self-pay | Admitting: Physician Assistant

## 2018-09-05 VITALS — BP 112/60 | HR 68 | Ht 60.0 in | Wt 153.0 lb

## 2018-09-05 DIAGNOSIS — R194 Change in bowel habit: Secondary | ICD-10-CM

## 2018-09-05 NOTE — Patient Instructions (Signed)
If you are age 78 or older, your body mass index should be between 23-30. Your Body mass index is 29.88 kg/m. If this is out of the aforementioned range listed, please consider follow up with your Primary Care Provider.  If you are age 54 or younger, your body mass index should be between 19-25. Your Body mass index is 29.88 kg/m. If this is out of the aformentioned range listed, please consider follow up with your Primary Care Provider.   Follow up as needed.  Thank you for choosing me and [facility].   Ellouise Newer, PA-C

## 2018-09-05 NOTE — Progress Notes (Signed)
Chief Complaint: Follow-up change in bowel habits  HPI:    Amber Gray is a 77 year old Caucasian female with a past medical history as listed below, who follows with Dr. Silverio Decamp and returns to clinic today for follow-up of change in bowel habits.    01/09/2017 colonoscopy with two 4-6 mm polyps in the sigmoid colon at hepatic flexure removed with a cold snare, pathology revealed a sessile serrated polyp and a tubular adenoma, repeat recommended in 5 years, but patient will be 79 at that time and repeat not needed unless for other reasons.    07/21/2018 office visit with me to discuss hard little marbles stools as well as some breakthrough watery loose diarrhea.  Had been tried on multiple laxatives but none of them it helped.  At that time KUB was ordered.  Also recommend the patient start MiraLAX twice a day.  Titration of this was discussed.    07/21/2018 KUB showed a moderate amount of fecal matter in the colon within the general range of normal.    Today, patient tells me that she has been using her MiraLAX twice a day and is having solid normal stools.  Very occasionally she will still have some loose stool but says this is dependent upon what she eats.  Overall the patient is very happy and has no further complaints for me at this time.    Denies fever, chills, weight loss, nausea, vomiting, heartburn, reflux or symptoms that awaken her from sleep.  Past Medical History:  Diagnosis Date  . Arthritis    "all over my body" (05/27/2017)  . Chronic kidney disease (CKD), stage III (moderate) (HCC)   . Chronic lower back pain   . Frequent UTI   . GERD (gastroesophageal reflux disease)   . Headache    "monthly" (05/27/2017)  . History of blood transfusion    "when I had my neck fusion"  . History of hiatal hernia   . Hypercholesteremia   . Hypertension   . Hypothyroid   . Pneumonia 2000s X 1; 05/27/2017  . Seasonal allergies   . Type II diabetes mellitus ([place])     Past Surgical  History:  Procedure Laterality Date  . BACK SURGERY    . BREAST BIOPSY Left   . CATARACT EXTRACTION W/ INTRAOCULAR LENS  IMPLANT, BILATERAL Bilateral   . COLONOSCOPY  10/25/2005   normal  . DILATION AND CURETTAGE OF UTERUS    . POSTERIOR CERVICAL FUSION/FORAMINOTOMY     "fused 4,5,6"  . TONSILLECTOMY      Current Outpatient Medications  Medication Sig Dispense Refill  . acetaminophen (TYLENOL) 325 MG tablet Take 650 mg by mouth every 6 (six) hours as needed for mild pain.    Marland Kitchen albuterol (PROVENTIL HFA;VENTOLIN HFA) 108 (90 BASE) MCG/ACT inhaler Inhale 2 puffs into the lungs every 6 (six) hours as needed. For wheezing.    Marland Kitchen ALPRAZolam (XANAX) 0.25 MG tablet Take 0.25 mg by mouth daily as needed.  0  . cetirizine (ZYRTEC) 10 MG tablet Take 10 mg by mouth daily.    . empagliflozin (JARDIANCE) 10 MG TABS tablet Take 10 mg by mouth daily.    . furosemide (LASIX) 40 MG tablet TAKE 1/2 TO 1 TAB BY MOUTH EVERY DAY FOR EDEMA AS DIRECTED  11  . glimepiride (AMARYL) 1 MG tablet Take 0.5 mg by mouth daily.    Marland Kitchen levothyroxine (SYNTHROID, LEVOTHROID) 75 MCG tablet Take 75 mcg by mouth daily.    Marland Kitchen lisinopril (PRINIVIL,ZESTRIL) 20  MG tablet Take 20 mg by mouth 2 (two) times daily.  6  . omeprazole (PRILOSEC) 20 MG capsule Take 20 mg by mouth daily.    . sertraline (ZOLOFT) 50 MG tablet Take 50 mg by mouth daily.  0  . simvastatin (ZOCOR) 40 MG tablet Take 40 mg by mouth daily.  3   No current facility-administered medications for this visit.     Allergies as of 09/05/2018 - Review Complete 09/05/2018  Allergen Reaction Noted  . Metformin and related Other (See Comments) 12/26/2016    Family History  Problem Relation Age of Onset  . Alzheimer's disease Mother   . Heart attack Father   . Heart disease Father   . Dwarfism Sister   . Colon cancer Neg Hx   . Colon polyps Neg Hx   . Esophageal cancer Neg Hx   . Rectal cancer Neg Hx   . Stomach cancer Neg Hx     Social History    Socioeconomic History  . Marital status: Divorced    Spouse name: Not on file  . Number of children: Not on file  . Years of education: Not on file  . Highest education level: Not on file  Occupational History  . Not on file  Social Needs  . Financial resource strain: Not on file  . Food insecurity:    Worry: Not on file    Inability: Not on file  . Transportation needs:    Medical: Not on file    Non-medical: Not on file  Tobacco Use  . Smoking status: Former Smoker    Packs/day: 1.00    Years: 6.00    Pack years: 6.00    Types: Cigarettes    Last attempt to quit: 10/01/1964    Years since quitting: 53.9  . Smokeless tobacco: Never Used  Substance and Sexual Activity  . Alcohol use: No  . Drug use: No  . Sexual activity: Never  Lifestyle  . Physical activity:    Days per week: Not on file    Minutes per session: Not on file  . Stress: Not on file  Relationships  . Social connections:    Talks on phone: Not on file    Gets together: Not on file    Attends religious service: Not on file    Active member of club or organization: Not on file    Attends meetings of clubs or organizations: Not on file    Relationship status: Not on file  . Intimate partner violence:    Fear of current or ex partner: Not on file    Emotionally abused: Not on file    Physically abused: Not on file    Forced sexual activity: Not on file  Other Topics Concern  . Not on file  Social History Narrative  . Not on file    Review of Systems:    Constitutional: No weight loss, fever or chills Cardiovascular: No chest pain Respiratory: No SOB  Gastrointestinal: See HPI and otherwise negative   Physical Exam:  Vital signs: BP 112/60   Pulse 68   Ht 5' (1.524 m)   Wt 153 lb (69.4 kg)   BMI 29.88 kg/m   Constitutional:   Pleasant Elderly Caucasian female appears to be in NAD, Well developed, Well nourished, alert and cooperative Respiratory: Respirations even and unlabored. Lungs  clear to auscultation bilaterally.   No wheezes, crackles, or rhonchi.  Cardiovascular: Normal S1, S2. No MRG. Regular rate and rhythm. No  peripheral edema, cyanosis or pallor.  Gastrointestinal:  Soft, nondistended, nontender. No rebound or guarding. Normal bowel sounds. No appreciable masses or hepatomegaly. Psychiatric: Demonstrates good judgement and reason without abnormal affect or behaviors.  No recent labs.  Assessment: 1.  Change in bowel habits: Towards constipation, corrected with MiraLAX  Plan: 1.  Continue MiraLAX twice daily. 2.  Patient to follow in clinic as needed in the future.  Ellouise Newer, PA-C [facility] 09/05/2018, 12:45 PM  Cc: [name], MD

## 2018-09-17 ENCOUNTER — Encounter: Payer: Self-pay | Admitting: Podiatry

## 2018-09-17 ENCOUNTER — Ambulatory Visit (INDEPENDENT_AMBULATORY_CARE_PROVIDER_SITE_OTHER): Payer: Medicare HMO | Admitting: Podiatry

## 2018-09-17 DIAGNOSIS — M2141 Flat foot [pes planus] (acquired), right foot: Secondary | ICD-10-CM | POA: Diagnosis not present

## 2018-09-17 DIAGNOSIS — M79675 Pain in left toe(s): Secondary | ICD-10-CM | POA: Diagnosis not present

## 2018-09-17 DIAGNOSIS — M2142 Flat foot [pes planus] (acquired), left foot: Secondary | ICD-10-CM

## 2018-09-17 DIAGNOSIS — M79674 Pain in right toe(s): Secondary | ICD-10-CM | POA: Diagnosis not present

## 2018-09-17 DIAGNOSIS — E1142 Type 2 diabetes mellitus with diabetic polyneuropathy: Secondary | ICD-10-CM | POA: Diagnosis not present

## 2018-09-17 DIAGNOSIS — B351 Tinea unguium: Secondary | ICD-10-CM | POA: Diagnosis not present

## 2018-09-17 NOTE — Progress Notes (Signed)
Complaint:  Visit Type: Patient returns to my office for continued preventative foot care services. Complaint: Patient states" my nails have grown long and thick and become painful to walk and wear shoes" Patient has been diagnosed with DM with no foot complications. The patient presents for preventative foot care services. No changes to ROS  Podiatric Exam: Vascular: dorsalis pedis and posterior tibial pulses are palpable bilateral. Capillary return is immediate. Temperature gradient is WNL. Skin turgor WNL  Sensorium: Normal Semmes Weinstein monofilament test. Normal tactile sensation bilaterally. Nail Exam: Pt has thick disfigured discolored nails with subungual debris noted bilateral entire nail hallux through fifth toenails Ulcer Exam: There is no evidence of ulcer or pre-ulcerative changes or infection. Orthopedic Exam: Muscle tone and strength are WNL. No limitations in general ROM. No crepitus or effusions noted. HAV  B/L.  Pes planus Skin: No Porokeratosis. No infection or ulcers  Diagnosis:  Onychomycosis, , Pain in right toe, pain in left toes  Treatment & Plan Procedures and Treatment: Consent by patient was obtained for treatment procedures.   Debridement of mycotic and hypertrophic toenails, 1 through 5 bilateral and clearing of subungual debris. No ulceration, no infection noted.  Return Visit-Office Procedure: Patient instructed to return to the office for a follow up visit  10 weeks  for continued evaluation and treatment.    Iara Monds DPM 

## 2018-10-07 NOTE — Progress Notes (Signed)
Reviewed and agree with documentation and assessment and plan. K. Veena Bhavesh Vazquez , MD   

## 2018-10-22 ENCOUNTER — Encounter (INDEPENDENT_AMBULATORY_CARE_PROVIDER_SITE_OTHER): Payer: Self-pay | Admitting: Orthopaedic Surgery

## 2018-10-22 ENCOUNTER — Ambulatory Visit (INDEPENDENT_AMBULATORY_CARE_PROVIDER_SITE_OTHER): Payer: Medicare HMO | Admitting: Orthopaedic Surgery

## 2018-10-22 ENCOUNTER — Ambulatory Visit (INDEPENDENT_AMBULATORY_CARE_PROVIDER_SITE_OTHER): Payer: Medicare HMO

## 2018-10-22 VITALS — BP 126/67 | HR 57 | Ht 60.0 in | Wt 153.0 lb

## 2018-10-22 DIAGNOSIS — M2241 Chondromalacia patellae, right knee: Secondary | ICD-10-CM

## 2018-10-22 MED ORDER — LIDOCAINE HCL 1 % IJ SOLN
0.5000 mL | INTRAMUSCULAR | Status: AC | PRN
  Administered 2018-10-22: .5 mL

## 2018-10-22 MED ORDER — METHYLPREDNISOLONE ACETATE 40 MG/ML IJ SUSP
40.0000 mg | INTRAMUSCULAR | Status: AC | PRN
  Administered 2018-10-22: 40 mg via INTRA_ARTICULAR

## 2018-10-22 MED ORDER — BUPIVACAINE HCL 0.25 % IJ SOLN
4.0000 mL | INTRAMUSCULAR | Status: AC | PRN
  Administered 2018-10-22: 4 mL via INTRA_ARTICULAR

## 2018-10-22 NOTE — Progress Notes (Signed)
Office Visit Note   Patient: Amber Gray           Date of Birth: August 04, 1941           MRN: [id number] Visit Date: 10/22/2018              Requested by: [name], [address], [place] [address] PCP: [name], MD   Assessment & Plan: Visit Diagnoses:  1. Chondromalacia patellae, right knee     Plan: She tolerated the injection well follow-up PRN.  Follow-Up Instructions: Return if symptoms worsen or fail to improve.   Orders:  Orders Placed This Encounter  Procedures  . Large Joint Inj  . XR KNEE 3 VIEW RIGHT   No orders of the defined types were placed in this encounter.     Procedures: Large Joint Inj: R knee on 10/22/2018 10:13 AM Indications: pain and joint swelling Details: 22 G 1.5 in needle, anterolateral approach  Arthrogram: No  Medications: 40 mg methylPREDNISolone acetate 40 MG/ML; 0.5 mL lidocaine 1 %; 4 mL bupivacaine 0.25 % Outcome: tolerated well, no immediate complications Procedure, treatment alternatives, risks and benefits explained, specific risks discussed. Consent was given by the patient. Immediately prior to procedure a time out was called to verify the correct patient, procedure, equipment, support staff and site/side marked as required. Patient was prepped and draped in the usual sterile fashion.       Clinical Data: No additional findings.   Subjective: Chief Complaint  Patient presents with  . Right Knee - Pain, Edema    HPI 78 year old female returns with ongoing problems with right knee pain.  She is ambulating with a walker and is had increased swelling pain primarily medial joint line.  Previous injection x3 over the last several years last injection was in May 2019 gave her 6 months plus of relief and she is requesting a repeat injection today.  MRI 02/02/2018 right knee demonstrated some patella chondromalacia no acute findings ligamentous structures were intact as well as meniscus prominent patellar  spurring.  Review of Systems   Objective: Vital Signs: BP 126/67   Pulse (!) 57   Ht 5' (1.524 m)   Wt 153 lb (69.4 kg)   BMI 29.88 kg/m   Physical Exam Constitutional:      Appearance: She is well-developed.  HENT:     Head: Normocephalic.     Right Ear: External ear normal.     Left Ear: External ear normal.  Eyes:     Pupils: Pupils are equal, round, and reactive to light.  Neck:     Thyroid: No thyromegaly.     Trachea: No tracheal deviation.  Cardiovascular:     Rate and Rhythm: Normal rate.  Pulmonary:     Effort: Pulmonary effort is normal.  Abdominal:     Palpations: Abdomen is soft.  Skin:    General: Skin is warm and dry.  Neurological:     Mental Status: She is alert and oriented to person, place, and time.  Psychiatric:        Behavior: Behavior normal.     Ortho Exam patient is mild knee synovitis crepitus with knee range of motion collateral ligaments crucial ligament exam right and left knee is normal no pain with hip range of motion.  Specialty Comments:  No specialty comments available.  Imaging: Xr Knee 3 View Right  Result Date: 10/22/2018 Standing x-rays both knees lateral right knee and sunrise patellar bilateral x-rays are obtained  and reviewed.  This shows bilateral medial joint line narrowing with small spurring.  Some patellofemoral spurring consistent with mild to moderate osteoarthritis primarily medial and patellofemoral joint. Impression: Bilateral mild to moderate knee osteoarthritis with primarily medial and patellofemoral degenerative changes.    PMFS History: Patient Active Problem List   Diagnosis Date Noted  . Synovitis of knee 02/05/2018  . 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 Sepsis due to pneumonia ([place]) 05/27/2017  . CKD (chronic kidney disease), stage III ([place]) 05/27/2017  . Diabetes mellitus type 2, uncontrolled ([place]) 05/27/2017  . Hypothyroid 05/27/2017  . Hypercholesteremia 05/27/2017  . Acute  hypokalemia 05/27/2017  . HTN (hypertension) 05/27/2017  . Community acquired pneumonia of left lower lobe of lung ([place])   . Diabetes mellitus with complication ([place])   . Type 2 diabetes mellitus ([place]) 06/27/2015  . Hypertension 06/27/2015  . Hypothyroidism 06/27/2015  . Chest pain 06/27/2015  . Precordial chest pain    Past Medical History:  Diagnosis Date  . Arthritis    "all over my body" (05/27/2017)  . Chronic kidney disease (CKD), stage III (moderate) (HCC)   . Chronic lower back pain   . Frequent UTI   . GERD (gastroesophageal reflux disease)   . Headache    "monthly" (05/27/2017)  . History of blood transfusion    "when I had my neck fusion"  . History of hiatal hernia   . Hypercholesteremia   . Hypertension   . Hypothyroid   . Pneumonia 2000s X 1; 05/27/2017  . Seasonal allergies   . Type II diabetes mellitus (HCC)     Family History  Problem Relation Age of Onset  . Alzheimer's disease Mother   . Heart attack Father   . Heart disease Father   . Dwarfism Sister   . Colon cancer Neg Hx   . Colon polyps Neg Hx   . Esophageal cancer Neg Hx   . Rectal cancer Neg Hx   . Stomach cancer Neg Hx     Past Surgical History:  Procedure Laterality Date  . BACK SURGERY    . BREAST BIOPSY Left   . CATARACT EXTRACTION W/ INTRAOCULAR LENS  IMPLANT, BILATERAL Bilateral   . COLONOSCOPY  10/25/2005   normal  . DILATION AND CURETTAGE OF UTERUS    . POSTERIOR CERVICAL FUSION/FORAMINOTOMY     "fused 4,5,6"  . TONSILLECTOMY     Social History   Occupational History  . Not on file  Tobacco Use  . Smoking status: Former Smoker    Packs/day: 1.00    Years: 6.00    Pack years: 6.00    Types: Cigarettes    Last attempt to quit: 10/01/1964    Years since quitting: 54.0  . Smokeless tobacco: Never Used  Substance and Sexual Activity  . Alcohol use: No  . Drug use: No  . Sexual activity: Never

## 2018-11-06 ENCOUNTER — Ambulatory Visit (INDEPENDENT_AMBULATORY_CARE_PROVIDER_SITE_OTHER): Payer: Medicare HMO

## 2018-11-06 ENCOUNTER — Ambulatory Visit (INDEPENDENT_AMBULATORY_CARE_PROVIDER_SITE_OTHER): Payer: Medicare HMO | Admitting: Surgery

## 2018-11-06 ENCOUNTER — Encounter (INDEPENDENT_AMBULATORY_CARE_PROVIDER_SITE_OTHER): Payer: Self-pay | Admitting: Surgery

## 2018-11-06 DIAGNOSIS — M7661 Achilles tendinitis, right leg: Secondary | ICD-10-CM

## 2018-11-06 DIAGNOSIS — M25571 Pain in right ankle and joints of right foot: Secondary | ICD-10-CM

## 2018-11-06 NOTE — Progress Notes (Signed)
Office Visit Note   Patient: Amber Gray           Date of Birth: Oct 11, 1940           MRN: [id number] Visit Date: 11/06/2018              Requested by: [name], [address], [place] [address] PCP: [name], MD   Assessment & Plan: Visit Diagnoses:  1. Acute right ankle pain   2. Achilles tendinitis, right leg     Plan: Since patient's posterior heel pain is been ongoing only for a couple of weeks I will try conservative management with a Cam boot.  Patient will wear this over the next week and then follow-up with me for repeat exam.  If she continues to be symptomatic and not improving I will plan to order an MRI to rule out Achilles tendon tear.  Follow-Up Instructions: No follow-ups on file.   Orders:  Orders Placed This Encounter  Procedures  . XR Ankle Complete Right   No orders of the defined types were placed in this encounter.     Procedures: No procedures performed   Clinical Data: No additional findings.   Subjective: Chief Complaint  Patient presents with  . Right Foot - Pain    HPI 78 year old white female comes in today with complaints of right posterior heel pain.  Patient states that pain started shortly after last office visit with Dr. Lorin Mercy.  Most of her pain is in the posterior heel.  Worse when she is up and weightbearing.  She denies any previous problems before onset.  Objective: Vital Signs: There were no vitals taken for this visit.  Physical Exam HENT:     Head: Normocephalic and atraumatic.  Pulmonary:     Effort: Pulmonary effort is normal.  Musculoskeletal:     Comments: Patient does have some right ankle swelling.  Mildly tender across the anterior tibiotalar joint line.  She is moderate to markedly tender Achilles tendon calcaneal insertion point.  Does have a palpable calcaneal spur.  Do not feel an obvious tendon defect.  She is also somewhat tender up into the watershed area.  Mild pre-Achilles  bursal tenderness.  Calf is nontender.  Neurological:     General: No focal deficit present.     Mental Status: She is alert and oriented to person, place, and time.     Ortho Exam  Specialty Comments:  No specialty comments available.  Imaging: Xr Ankle Complete Right  Result Date: 11/06/2018 X-ray right ankle does show some degenerative changes.  No acute finding.  Patient does have a large spur of the calcaneal Achilles insertion point.    PMFS History: Patient Active Problem List   Diagnosis Date Noted  . Synovitis of knee 02/05/2018  . 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 Sepsis due to pneumonia ([place]) 05/27/2017  . CKD (chronic kidney disease), stage III ([place]) 05/27/2017  . Diabetes mellitus type 2, uncontrolled ([place]) 05/27/2017  . Hypothyroid 05/27/2017  . Hypercholesteremia 05/27/2017  . Acute hypokalemia 05/27/2017  . HTN (hypertension) 05/27/2017  . Community acquired pneumonia of left lower lobe of lung ([place])   . Diabetes mellitus with complication ([place])   . Type 2 diabetes mellitus ([place]) 06/27/2015  . Hypertension 06/27/2015  . Hypothyroidism 06/27/2015  . Chest pain 06/27/2015  . Precordial chest pain    Past Medical History:  Diagnosis Date  . Arthritis    "all over my body" (05/27/2017)  .  Chronic kidney disease (CKD), stage III (moderate) (HCC)   . Chronic lower back pain   . Frequent UTI   . GERD (gastroesophageal reflux disease)   . Headache    "monthly" (05/27/2017)  . History of blood transfusion    "when I had my neck fusion"  . History of hiatal hernia   . Hypercholesteremia   . Hypertension   . Hypothyroid   . Pneumonia 2000s X 1; 05/27/2017  . Seasonal allergies   . Type II diabetes mellitus (HCC)     Family History  Problem Relation Age of Onset  . Alzheimer's disease Mother   . Heart attack Father   . Heart disease Father   . Dwarfism Sister   . Colon cancer Neg Hx   . Colon polyps Neg Hx   . Esophageal cancer  Neg Hx   . Rectal cancer Neg Hx   . Stomach cancer Neg Hx     Past Surgical History:  Procedure Laterality Date  . BACK SURGERY    . BREAST BIOPSY Left   . CATARACT EXTRACTION W/ INTRAOCULAR LENS  IMPLANT, BILATERAL Bilateral   . COLONOSCOPY  10/25/2005   normal  . DILATION AND CURETTAGE OF UTERUS    . POSTERIOR CERVICAL FUSION/FORAMINOTOMY     "fused 4,5,6"  . TONSILLECTOMY     Social History   Occupational History  . Not on file  Tobacco Use  . Smoking status: Former Smoker    Packs/day: 1.00    Years: 6.00    Pack years: 6.00    Types: Cigarettes    Last attempt to quit: 10/01/1964    Years since quitting: 54.1  . Smokeless tobacco: Never Used  Substance and Sexual Activity  . Alcohol use: No  . Drug use: No  . Sexual activity: Never

## 2018-11-13 ENCOUNTER — Encounter (INDEPENDENT_AMBULATORY_CARE_PROVIDER_SITE_OTHER): Payer: Self-pay | Admitting: Surgery

## 2018-11-13 ENCOUNTER — Ambulatory Visit (INDEPENDENT_AMBULATORY_CARE_PROVIDER_SITE_OTHER): Payer: Medicare HMO | Admitting: Surgery

## 2018-11-13 DIAGNOSIS — M7660 Achilles tendinitis, unspecified leg: Secondary | ICD-10-CM | POA: Diagnosis not present

## 2018-11-13 NOTE — Progress Notes (Signed)
78 year old white female returns for recheck of right posterior heel pain.  She has been compliant with wearing the cam boot.  Continues have ongoing pain at the Achilles tendon calcaneal insertion.   Exam Very pleasant white female alert and oriented in no acute distress.  She still continues to be moderate to markedly tender over the Achilles tendon calcaneal insertion point.  Some pain around the watershed area.   Plan At this point I recommend getting an MRI right heel to rule out Achilles tendon tear.  Follow-up with Dr. Lorin Mercy after completion to discuss results and further treatment options.  Continue wearing the cam boot until we see her back.

## 2018-11-25 DIAGNOSIS — E1122 Type 2 diabetes mellitus with diabetic chronic kidney disease: Secondary | ICD-10-CM | POA: Diagnosis not present

## 2018-11-25 DIAGNOSIS — I10 Essential (primary) hypertension: Secondary | ICD-10-CM | POA: Diagnosis not present

## 2018-11-25 DIAGNOSIS — E7849 Other hyperlipidemia: Secondary | ICD-10-CM | POA: Diagnosis not present

## 2018-11-25 DIAGNOSIS — E038 Other specified hypothyroidism: Secondary | ICD-10-CM | POA: Diagnosis not present

## 2018-11-26 ENCOUNTER — Ambulatory Visit: Payer: Medicare HMO | Admitting: Podiatry

## 2018-11-28 ENCOUNTER — Ambulatory Visit (INDEPENDENT_AMBULATORY_CARE_PROVIDER_SITE_OTHER): Payer: Medicare HMO | Admitting: Orthopaedic Surgery

## 2018-11-28 ENCOUNTER — Encounter (INDEPENDENT_AMBULATORY_CARE_PROVIDER_SITE_OTHER): Payer: Self-pay | Admitting: Orthopaedic Surgery

## 2018-11-28 VITALS — BP 131/67 | HR 70 | Ht 60.0 in | Wt 153.0 lb

## 2018-11-28 DIAGNOSIS — M7661 Achilles tendinitis, right leg: Secondary | ICD-10-CM

## 2018-12-01 ENCOUNTER — Encounter (INDEPENDENT_AMBULATORY_CARE_PROVIDER_SITE_OTHER): Payer: Self-pay | Admitting: Orthopaedic Surgery

## 2018-12-01 DIAGNOSIS — M7661 Achilles tendinitis, right leg: Secondary | ICD-10-CM | POA: Insufficient documentation

## 2018-12-01 NOTE — Progress Notes (Signed)
Office Visit Note This week  Patient: Amber Gray           Date of Birth: 01/23/1941           MRN: [id number] Visit Date: 11/28/2018              Requested by: [name], [address] [place], [place] [address] PCP: [name], MD   Assessment & Plan: Visit Diagnoses:  1. Achilles tendinitis, right leg     Plan: Patient has some calcific tendinopathy of the insertion site of the Achilles tendon.  She is gotten significantly improved with the cam boot and using a rolling walker.  She can continue to use this for a few weeks make sure that she does not develop any plantar foot problems we discussed the importance of control of her diabetes.  Follow-Up Instructions: Return in about 5 weeks (around 01/02/2019).   Orders:  No orders of the defined types were placed in this encounter.  No orders of the defined types were placed in this encounter.     Procedures: No procedures performed   Clinical Data: No additional findings.   Subjective: Chief Complaint  Patient presents with  . Right Ankle - Pain    HPI 78 year old female returns has been using the cam boot and states her pain is significantly improved with Cam boot.  She did not get the MRI since she was not sure that was actually ordered and she was post to get it.  She is a diabetic has no ulcer problems on the plantar surface of her foot.  She has been ambulatory with a rolling walker which has helped.  She has minimal pain now as long as she wears her cam boot.  She denies fever chills no other joint symptoms.  No problems with the opposite left foot.  Patient is used Tylenol with relief.  Review of Systems 14 point review of systems updated unchanged from previous office visit other than as mentioned in HPI.  Objective: Vital Signs: BP 131/67   Pulse 70   Ht 5' (1.524 m)   Wt 153 lb (69.4 kg)   BMI 29.88 kg/m   Physical Exam Constitutional:      Appearance: She is well-developed.  HENT:    Head: Normocephalic.     Right Ear: External ear normal.     Left Ear: External ear normal.  Eyes:     Pupils: Pupils are equal, round, and reactive to light.     Comments:  patient wears glasses  Neck:     Thyroid: No thyromegaly.     Trachea: No tracheal deviation.  Cardiovascular:     Rate and Rhythm: Normal rate.  Pulmonary:     Effort: Pulmonary effort is normal.  Abdominal:     Palpations: Abdomen is soft.  Skin:    General: Skin is warm and dry.  Neurological:     Mental Status: She is alert and oriented to person, place, and time.  Psychiatric:        Behavior: Behavior normal.     Ortho Exam patient has a prominence at the Achilles insertion site with pump bump.  No tenderness over the plantar fascial origin.  Pain with resisted Achilles tendon contracture.  No palpable defect in the Achilles tendon.  No atrophy of the gastrocsoleus compared to the opposite left leg.  Sensation is intact no plantar foot lesions.  Specialty Comments:  No specialty comments available.  Imaging: No results found.  PMFS History: Patient Active Problem List   Diagnosis Date Noted  . Synovitis of knee 02/05/2018  . 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 Sepsis due to pneumonia ([facility]) 05/27/2017  . CKD (chronic kidney disease), stage III ([facility]) 05/27/2017  . Diabetes mellitus type 2, uncontrolled ([facility]) 05/27/2017  . Hypothyroid 05/27/2017  . Hypercholesteremia 05/27/2017  . Acute hypokalemia 05/27/2017  . HTN (hypertension) 05/27/2017  . Community acquired pneumonia of left lower lobe of lung ([facility])   . Diabetes mellitus with complication ([place])   . Type 2 diabetes mellitus ([facility]) 06/27/2015  . Hypertension 06/27/2015  . Hypothyroidism 06/27/2015  . Chest pain 06/27/2015  . Precordial chest pain    Past Medical History:  Diagnosis Date  . Arthritis    "all over my body" (05/27/2017)  . Chronic kidney disease (CKD), stage III (moderate) (HCC)   . Chronic lower  back pain   . Frequent UTI   . GERD (gastroesophageal reflux disease)   . Headache    "monthly" (05/27/2017)  . History of blood transfusion    "when I had my neck fusion"  . History of hiatal hernia   . Hypercholesteremia   . Hypertension   . Hypothyroid   . Pneumonia 2000s X 1; 05/27/2017  . Seasonal allergies   . Type II diabetes mellitus (HCC)     Family History  Problem Relation Age of Onset  . Alzheimer's disease Mother   . Heart attack Father   . Heart disease Father   . Dwarfism Sister   . Colon cancer Neg Hx   . Colon polyps Neg Hx   . Esophageal cancer Neg Hx   . Rectal cancer Neg Hx   . Stomach cancer Neg Hx     Past Surgical History:  Procedure Laterality Date  . BACK SURGERY    . BREAST BIOPSY Left   . CATARACT EXTRACTION W/ INTRAOCULAR LENS  IMPLANT, BILATERAL Bilateral   . COLONOSCOPY  10/25/2005   normal  . DILATION AND CURETTAGE OF UTERUS    . POSTERIOR CERVICAL FUSION/FORAMINOTOMY     "fused 4,5,6"  . TONSILLECTOMY     Social History   Occupational History  . Not on file  Tobacco Use  . Smoking status: Former Smoker    Packs/day: 1.00    Years: 6.00    Pack years: 6.00    Types: Cigarettes    Last attempt to quit: 10/01/1964    Years since quitting: 54.2  . Smokeless tobacco: Never Used  Substance and Sexual Activity  . Alcohol use: No  . Drug use: No  . Sexual activity: Never

## 2018-12-02 DIAGNOSIS — R627 Adult failure to thrive: Secondary | ICD-10-CM | POA: Diagnosis not present

## 2018-12-02 DIAGNOSIS — F329 Major depressive disorder, single episode, unspecified: Secondary | ICD-10-CM | POA: Diagnosis not present

## 2018-12-02 DIAGNOSIS — E1122 Type 2 diabetes mellitus with diabetic chronic kidney disease: Secondary | ICD-10-CM | POA: Diagnosis not present

## 2018-12-02 DIAGNOSIS — Z0000 Encounter for general adult medical examination without abnormal findings: Secondary | ICD-10-CM | POA: Diagnosis not present

## 2018-12-02 DIAGNOSIS — N183 Chronic kidney disease, stage 3 (moderate): Secondary | ICD-10-CM | POA: Diagnosis not present

## 2018-12-02 DIAGNOSIS — R9431 Abnormal electrocardiogram [ECG] [EKG]: Secondary | ICD-10-CM | POA: Diagnosis not present

## 2018-12-02 DIAGNOSIS — M7661 Achilles tendinitis, right leg: Secondary | ICD-10-CM | POA: Diagnosis not present

## 2018-12-02 DIAGNOSIS — I129 Hypertensive chronic kidney disease with stage 1 through stage 4 chronic kidney disease, or unspecified chronic kidney disease: Secondary | ICD-10-CM | POA: Diagnosis not present

## 2018-12-02 DIAGNOSIS — E1140 Type 2 diabetes mellitus with diabetic neuropathy, unspecified: Secondary | ICD-10-CM | POA: Diagnosis not present

## 2018-12-24 ENCOUNTER — Ambulatory Visit: Payer: Medicare HMO | Admitting: Podiatry

## 2018-12-25 ENCOUNTER — Encounter (INDEPENDENT_AMBULATORY_CARE_PROVIDER_SITE_OTHER): Payer: Self-pay | Admitting: *Deleted

## 2019-01-01 ENCOUNTER — Telehealth (INDEPENDENT_AMBULATORY_CARE_PROVIDER_SITE_OTHER): Payer: Self-pay | Admitting: Radiology

## 2019-01-01 NOTE — Telephone Encounter (Signed)
I called patient to confirm appointment for 01/02/2019 at 1pm. Patient answered "No" to all COVID-19 screening questions.

## 2019-01-02 ENCOUNTER — Ambulatory Visit (INDEPENDENT_AMBULATORY_CARE_PROVIDER_SITE_OTHER): Payer: Medicare HMO | Admitting: Orthopaedic Surgery

## 2019-01-02 ENCOUNTER — Encounter (INDEPENDENT_AMBULATORY_CARE_PROVIDER_SITE_OTHER): Payer: Self-pay | Admitting: Orthopaedic Surgery

## 2019-01-02 ENCOUNTER — Other Ambulatory Visit: Payer: Self-pay

## 2019-01-02 VITALS — Ht 60.0 in | Wt 153.0 lb

## 2019-01-02 DIAGNOSIS — M7661 Achilles tendinitis, right leg: Secondary | ICD-10-CM

## 2019-01-02 NOTE — Progress Notes (Signed)
Office Visit Note   Patient: Amber Gray           Date of Birth: Oct 24, 1940           MRN: [id number] Visit Date: 01/02/2019              Requested by: [name], [address] [place], [place] [address] PCP: [name], MD   Assessment & Plan: Visit Diagnoses:  1. Achilles tendinitis, right leg     Plan: Patient continue the boot for 1 more week then she can gradually wean her way out wearing it part of the day and removing it part of the day and wean her way out as tolerated.  If she has increased symptoms she will call let us know.  She is happy with the results of the boot and is continuing to use her walker to avoid falling.  Follow-Up Instructions: Return if symptoms worsen or fail to improve.   Orders:  No orders of the defined types were placed in this encounter.  No orders of the defined types were placed in this encounter.     Procedures: No procedures performed   Clinical Data: No additional findings.   Subjective: Chief Complaint  Patient presents with  . Right Ankle - Follow-up    HPI 78 year old female with type 2 diabetes returns with right Achilles tendinopathy of the insertion site.  She has been in a cam boot for 5 weeks and states is been tremendously helpful in relieving her pain.  She has some discomfort anteriorly across the anterior aspect of the ankle but states the pain at her Achilles tendon is been relieved.  She denies gout no chills or fever.  Review of Systems updated unchanged from 11/28/2018 office visit.  Of note is history of type 2 diabetes and kidney disease.   Objective: Vital Signs: Ht 5' (1.524 m)   Wt 153 lb (69.4 kg)   BMI 29.88 kg/m   Physical Exam Constitutional:      Appearance: She is well-developed.  HENT:     Head: Normocephalic.     Right Ear: External ear normal.     Left Ear: External ear normal.  Eyes:     Pupils: Pupils are equal, round, and reactive to light.  Neck:     Thyroid: No  thyromegaly.     Trachea: No tracheal deviation.  Cardiovascular:     Rate and Rhythm: Normal rate.  Pulmonary:     Effort: Pulmonary effort is normal.  Abdominal:     Palpations: Abdomen is soft.  Skin:    General: Skin is warm and dry.  Neurological:     Mental Status: She is alert and oriented to person, place, and time.  Psychiatric:        Behavior: Behavior normal.     Ortho Exam patient has no prominence or tenderness at the Achilles tendon insertion site.  No plantar foot lesions pulses are normal good capillary refill no ankle effusion.  Mild callus formation over medial bunion without ulceration. Specialty Comments:  No specialty comments available.  Imaging: No results found.   PMFS History: Patient Active Problem List   Diagnosis Date Noted  . Achilles tendinitis, right leg 12/01/2018  . Synovitis of knee 02/05/2018  . 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 Sepsis due to pneumonia ([facility]) 05/27/2017  . CKD (chronic kidney disease), stage III ([facility]) 05/27/2017  . Diabetes mellitus type 2, uncontrolled ([place]) 05/27/2017  . Hypothyroid 05/27/2017  .  Hypercholesteremia 05/27/2017  . Acute hypokalemia 05/27/2017  . HTN (hypertension) 05/27/2017  . Community acquired pneumonia of left lower lobe of lung ([facility])   . Diabetes mellitus with complication ([facility])   . Type 2 diabetes mellitus ([place] [facility]) 06/27/2015  . Hypertension 06/27/2015  . Hypothyroidism 06/27/2015  . Chest pain 06/27/2015  . Precordial chest pain    Past Medical History:  Diagnosis Date  . Arthritis    "all over my body" (05/27/2017)  . Chronic kidney disease (CKD), stage III (moderate) (HCC)   . Chronic lower back pain   . Frequent UTI   . GERD (gastroesophageal reflux disease)   . Headache    "monthly" (05/27/2017)  . History of blood transfusion    "when I had my neck fusion"  . History of hiatal hernia   . Hypercholesteremia   . Hypertension   . Hypothyroid   . Pneumonia 2000s X  1; 05/27/2017  . Seasonal allergies   . Type II diabetes mellitus (HCC)     Family History  Problem Relation Age of Onset  . Alzheimer's disease Mother   . Heart attack Father   . Heart disease Father   . Dwarfism Sister   . Colon cancer Neg Hx   . Colon polyps Neg Hx   . Esophageal cancer Neg Hx   . Rectal cancer Neg Hx   . Stomach cancer Neg Hx     Past Surgical History:  Procedure Laterality Date  . BACK SURGERY    . BREAST BIOPSY Left   . CATARACT EXTRACTION W/ INTRAOCULAR LENS  IMPLANT, BILATERAL Bilateral   . COLONOSCOPY  10/25/2005   normal  . DILATION AND CURETTAGE OF UTERUS    . POSTERIOR CERVICAL FUSION/FORAMINOTOMY     "fused 4,5,6"  . TONSILLECTOMY     Social History   Occupational History  . Not on file  Tobacco Use  . Smoking status: Former Smoker    Packs/day: 1.00    Years: 6.00    Pack years: 6.00    Types: Cigarettes    Last attempt to quit: 10/01/1964    Years since quitting: 54.2  . Smokeless tobacco: Never Used  Substance and Sexual Activity  . Alcohol use: No  . Drug use: No  . Sexual activity: Never

## 2019-02-06 ENCOUNTER — Other Ambulatory Visit: Payer: Self-pay

## 2019-02-06 ENCOUNTER — Ambulatory Visit (INDEPENDENT_AMBULATORY_CARE_PROVIDER_SITE_OTHER): Payer: Medicare HMO | Admitting: Podiatry

## 2019-02-06 ENCOUNTER — Encounter: Payer: Self-pay | Admitting: Podiatry

## 2019-02-06 VITALS — Temp 97.7°F

## 2019-02-06 DIAGNOSIS — E1142 Type 2 diabetes mellitus with diabetic polyneuropathy: Secondary | ICD-10-CM

## 2019-02-06 DIAGNOSIS — M79674 Pain in right toe(s): Secondary | ICD-10-CM

## 2019-02-06 DIAGNOSIS — B351 Tinea unguium: Secondary | ICD-10-CM | POA: Diagnosis not present

## 2019-02-06 DIAGNOSIS — M79675 Pain in left toe(s): Secondary | ICD-10-CM

## 2019-02-06 NOTE — Progress Notes (Signed)
Complaint:  Visit Type: Patient returns to my office for continued preventative foot care services. Complaint: Patient states" my nails have grown long and thick and become painful to walk and wear shoes" Patient has been diagnosed with DM with no foot complications. The patient presents for preventative foot care services. No changes to ROS  Podiatric Exam: Vascular: dorsalis pedis and posterior tibial pulses are palpable bilateral. Capillary return is immediate. Temperature gradient is WNL. Skin turgor WNL  Sensorium: Normal Semmes Weinstein monofilament test. Normal tactile sensation bilaterally. Nail Exam: Pt has thick disfigured discolored nails with subungual debris noted bilateral entire nail hallux through fifth toenails Ulcer Exam: There is no evidence of ulcer or pre-ulcerative changes or infection. Orthopedic Exam: Muscle tone and strength are WNL. No limitations in general ROM. No crepitus or effusions noted. HAV  B/L.  Pes planus Skin: No Porokeratosis. No infection or ulcers  Diagnosis:  Onychomycosis, , Pain in right toe, pain in left toes  Treatment & Plan Procedures and Treatment: Consent by patient was obtained for treatment procedures.   Debridement of mycotic and hypertrophic toenails, 1 through 5 bilateral and clearing of subungual debris. No ulceration, no infection noted.  Return Visit-Office Procedure: Patient instructed to return to the office for a follow up visit  10 weeks  for continued evaluation and treatment.    Gardiner Barefoot DPM

## 2019-02-27 ENCOUNTER — Ambulatory Visit: Payer: Medicare HMO | Admitting: Orthopaedic Surgery

## 2019-03-06 ENCOUNTER — Ambulatory Visit: Payer: Medicare HMO | Admitting: Orthopaedic Surgery

## 2019-03-10 ENCOUNTER — Ambulatory Visit (INDEPENDENT_AMBULATORY_CARE_PROVIDER_SITE_OTHER): Payer: Medicare HMO | Admitting: Orthopaedic Surgery

## 2019-03-10 ENCOUNTER — Ambulatory Visit: Payer: Self-pay

## 2019-03-10 ENCOUNTER — Other Ambulatory Visit: Payer: Self-pay

## 2019-03-10 ENCOUNTER — Encounter: Payer: Self-pay | Admitting: Orthopaedic Surgery

## 2019-03-10 VITALS — Ht 60.0 in | Wt 146.0 lb

## 2019-03-10 DIAGNOSIS — M7660 Achilles tendinitis, unspecified leg: Secondary | ICD-10-CM

## 2019-03-10 DIAGNOSIS — M79672 Pain in left foot: Secondary | ICD-10-CM

## 2019-03-10 DIAGNOSIS — M19079 Primary osteoarthritis, unspecified ankle and foot: Secondary | ICD-10-CM

## 2019-03-10 DIAGNOSIS — M79671 Pain in right foot: Secondary | ICD-10-CM

## 2019-03-10 DIAGNOSIS — M7661 Achilles tendinitis, right leg: Secondary | ICD-10-CM

## 2019-03-10 DIAGNOSIS — M659 Synovitis and tenosynovitis, unspecified: Secondary | ICD-10-CM

## 2019-03-10 MED ORDER — BUPIVACAINE HCL 0.5 % IJ SOLN
3.0000 mL | INTRAMUSCULAR | Status: AC | PRN
Start: 2019-03-10 — End: 2019-03-10
  Administered 2019-03-10: 3 mL via INTRA_ARTICULAR

## 2019-03-10 MED ORDER — LIDOCAINE HCL 1 % IJ SOLN
1.0000 mL | INTRAMUSCULAR | Status: AC | PRN
  Administered 2019-03-10: 1 mL

## 2019-03-10 MED ORDER — METHYLPREDNISOLONE ACETATE 40 MG/ML IJ SUSP
40.0000 mg | INTRAMUSCULAR | Status: AC | PRN
  Administered 2019-03-10: 40 mg via INTRA_ARTICULAR

## 2019-03-10 NOTE — Progress Notes (Signed)
Office Visit Note   Patient: Amber Gray           Date of Birth: May 12, 1941           MRN: [id number] Visit Date: 03/10/2019              Requested by: [name], [address] [place], [place] [address] PCP: [name], MD   Assessment & Plan: Visit Diagnoses:  1. Pain in right foot   2. Achilles tendon pain   3. Achilles tendinitis, right leg   4. 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  She has failed conservative treatment over last several months.  We need to figure out if this is something that needs surgical intervention before she actually rupture the tendon.  Today offered right ankle injection versus first MTP joint injection.  Patient would like to try the ankle and stated that this is more painful at this time.  After patient consent right anterolateral ankle was prepped with Betadine and intra-articular Marcaine/Depo-Medrol injection performed.  After sitting for a couple of minutes patient reported excellent relief of her intra-articular ankle pain with Marcaine in place.  I will again order right heel MRI to rule out Achilles tendon tear since she has continued to failed conservative treatment up to this point.  Follow with Dr. Lorin Mercy to review MRI.  At follow-up visit if first MTP joint continues to be painful I did discuss possible intra-articular injection there.  Follow-Up Instructions: Return in about 2 weeks (around 03/24/2019) for To review right heel MRI.   Orders:  Orders Placed This Encounter  Procedures  . XR Foot Complete Right  . MR HEEL RIGHT WO CONTRAST   No orders of the defined types were placed in this encounter.     Procedures: Medium Joint Inj: R ankle on 03/10/2019 2:12 PM Indications: pain and joint swelling Details: 25 G 1.5 in needle, anterolateral approach Medications: 1 mL lidocaine 1 %; 3 mL  bupivacaine 0.5 %; 40 mg methylPREDNISolone acetate 40 MG/ML Outcome: tolerated well, no immediate complications Consent was given by the patient. Patient was prepped and draped in the usual sterile fashion.       Clinical Data: No additional findings.   Subjective: Chief Complaint  Patient presents with  . Right Ankle - Pain, Follow-up    HPI 78 year old female returns with complaints of right posterior heel pain.  She has known history of Achilles tendinopathy.  She was seen by me February 2020 and I had ordered an MRI to rule out Achilles tendon tear but study was not completed.  She has been managing this over the last few months with a walker, boot, and shoe insert.  She continues to have worsening right posterior heel pain at the Achilles tendon calcaneal insertion.  She is also complaining of new pain in the midfoot which is been ongoing x1 month.  Pain when she is weightbearing.  Has had some midfoot swelling.  No injury. Review of Systems No current cardiopulmonary GI GU issues  Objective: Vital Signs: Ht 5' (1.524 m)   Wt 146 lb (66.2 kg)   BMI 28.51 kg/m   Physical Exam HENT:     Head: Normocephalic and atraumatic.  Eyes:     Extraocular Movements: Extraocular movements intact.  Pulmonary:     Effort: No respiratory  distress.  Musculoskeletal:     Comments: Patient is exquisitely tender at the Achilles calcaneal insertion.  No obvious tendon defect.  Has mild posterior heel swelling.  Patient is  tender along the midfoot but has marked tenderness over the first MTP joint.  Palpable spurs around the joint.  Right ankle she is also markedly tender across the anterior tibiotalar joint line.  Neurological:     General: No focal deficit present.     Mental Status: She is alert.  Psychiatric:        Mood and Affect: Mood normal.     Ortho Exam  Specialty Comments:  No specialty comments available.  Imaging: No results found.   PMFS History: Patient Active  Problem List   Diagnosis Date Noted  . Achilles tendinitis, right leg 12/01/2018  . Synovitis of knee 02/05/2018  . 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 Sepsis due to pneumonia ([facility]) 05/27/2017  . CKD (chronic kidney disease), stage III ([facility]) 05/27/2017  . Diabetes mellitus type 2, uncontrolled ([facility]) 05/27/2017  . Hypothyroid 05/27/2017  . Hypercholesteremia 05/27/2017  . Acute hypokalemia 05/27/2017  . HTN (hypertension) 05/27/2017  . Community acquired pneumonia of left lower lobe of lung ([facility])   . Diabetes mellitus with complication ([place])   . Type 2 diabetes mellitus ([facility]) 06/27/2015  . Hypertension 06/27/2015  . Hypothyroidism 06/27/2015  . Chest pain 06/27/2015  . Precordial chest pain    Past Medical History:  Diagnosis Date  . Arthritis    "all over my body" (05/27/2017)  . Chronic kidney disease (CKD), stage III (moderate) (HCC)   . Chronic lower back pain   . Frequent UTI   . GERD (gastroesophageal reflux disease)   . Headache    "monthly" (05/27/2017)  . History of blood transfusion    "when I had my neck fusion"  . History of hiatal hernia   . Hypercholesteremia   . Hypertension   . Hypothyroid   . Pneumonia 2000s X 1; 05/27/2017  . Seasonal allergies   . Type II diabetes mellitus (HCC)     Family History  Problem Relation Age of Onset  . Alzheimer's disease Mother   . Heart attack Father   . Heart disease Father   . Dwarfism Sister   . Colon cancer Neg Hx   . Colon polyps Neg Hx   . Esophageal cancer Neg Hx   . Rectal cancer Neg Hx   . Stomach cancer Neg Hx     Past Surgical History:  Procedure Laterality Date  . BACK SURGERY    . BREAST BIOPSY Left   . CATARACT EXTRACTION W/ INTRAOCULAR LENS  IMPLANT, BILATERAL Bilateral   . COLONOSCOPY  10/25/2005   normal  . DILATION AND CURETTAGE OF UTERUS    . POSTERIOR CERVICAL FUSION/FORAMINOTOMY     "fused 4,5,6"  . TONSILLECTOMY     Social History   Occupational History  . Not on  file  Tobacco Use  . Smoking status: Former Smoker    Packs/day: 1.00    Years: 6.00    Pack years: 6.00    Types: Cigarettes    Last attempt to quit: 10/01/1964    Years since quitting: 54.4  . Smokeless tobacco: Never Used  Substance and Sexual Activity  . Alcohol use: No  . Drug use: No  . Sexual activity: Never

## 2019-03-30 ENCOUNTER — Telehealth: Payer: Self-pay | Admitting: Orthopedic Surgery

## 2019-03-30 NOTE — Telephone Encounter (Signed)
I called patient and advised. 

## 2019-03-30 NOTE — Telephone Encounter (Signed)
Please advise 

## 2019-03-30 NOTE — Telephone Encounter (Signed)
She can try using the boot off and on and see if that helps. Return after MRI . thanks

## 2019-03-30 NOTE — Telephone Encounter (Signed)
Amber Gray called in to let Jeneen Rinks know that the boot he put her in is aggravating her heel spur.  She feels like it is causing some swelling.  Waiting on MRI, what would we like her to do in the meantime?

## 2019-04-02 ENCOUNTER — Other Ambulatory Visit: Payer: Self-pay

## 2019-04-02 ENCOUNTER — Ambulatory Visit
Admission: RE | Admit: 2019-04-02 | Discharge: 2019-04-02 | Disposition: A | Discharge status: Home / Self Care | Payer: Medicare HMO | Source: Ambulatory Visit | Attending: Surgery | Admitting: Surgery

## 2019-04-02 DIAGNOSIS — M79671 Pain in right foot: Secondary | ICD-10-CM

## 2019-04-02 DIAGNOSIS — M67873 Other specified disorders of tendon, right ankle and foot: Secondary | ICD-10-CM | POA: Diagnosis not present

## 2019-04-08 ENCOUNTER — Other Ambulatory Visit: Payer: Self-pay

## 2019-04-08 ENCOUNTER — Ambulatory Visit (INDEPENDENT_AMBULATORY_CARE_PROVIDER_SITE_OTHER): Payer: Medicare HMO | Admitting: Orthopaedic Surgery

## 2019-04-08 ENCOUNTER — Encounter: Payer: Self-pay | Admitting: Orthopaedic Surgery

## 2019-04-08 VITALS — Ht 60.0 in | Wt 146.0 lb

## 2019-04-08 DIAGNOSIS — M7661 Achilles tendinitis, right leg: Secondary | ICD-10-CM

## 2019-04-08 NOTE — Progress Notes (Signed)
Office Visit Note   Patient: Amber Gray           Date of Birth: 11/10/1940           MRN: [id number] Visit Date: 04/08/2019              Requested by: [name], [address],  [place] [address] PCP: [name], MD   Assessment & Plan: Visit Diagnoses:  1. Achilles tendinitis, right leg     Plan: Patient continue cam boot protect Achilles tendon gradually work her way after a few weeks with short periods in a regular shoe and see how she does she can progress as tolerated.  I plan to recheck her in 3 months we discussed her symptoms should resolve with protective immobilization with the boot.  Follow-Up Instructions: Return in about 3 months (around 07/09/2019).   Orders:  No orders of the defined types were placed in this encounter.  No orders of the defined types were placed in this encounter.     Procedures: No procedures performed   Clinical Data: No additional findings.   Subjective: Chief Complaint  Patient presents with  . Right Foot - Follow-up, Pain    MRI Review Right Heel    HPI 78 year old female returns.  She had chronically been using a rolling walker with reverse seat and had increased heel pain failed conservative treatment.  MRI scan showed tendinopathy of the distal aspect Achilles tendon without evidence of old tear tear or partial tear.  She did get some temporary relief with an intra-articular ankle injection but noted some swelling around the posterior tibial tendon after the injection done by Benjiman Core, PA-C.  She is more comfortable walking with the boot.  She states in the last 2 weeks pain is gotten less in severity.  Review of Systems reviewed updated unchanged.  Of note his history of stage II kidney disease type 2 diabetes hypertension and knee arthritis.   Objective: Vital Signs: Ht 5' (1.524 m)   Wt 146 lb (66.2 kg)   BMI 28.51 kg/m   Physical Exam Constitutional:      Appearance: She is well-developed.   HENT:     Head: Normocephalic.     Right Ear: External ear normal.     Left Ear: External ear normal.  Eyes:     Pupils: Pupils are equal, round, and reactive to light.  Neck:     Thyroid: No thyromegaly.     Trachea: No tracheal deviation.  Cardiovascular:     Rate and Rhythm: Normal rate.  Pulmonary:     Effort: Pulmonary effort is normal.  Abdominal:     Palpations: Abdomen is soft.  Skin:    General: Skin is warm and dry.  Neurological:     Mental Status: She is alert and oriented to person, place, and time.  Psychiatric:        Behavior: Behavior normal.     Ortho Exam patient has tenderness of the distal aspect Achilles tendon where there is thickening and prominence of the tendon within the last 2 cm of the insertion in the calcaneus.  No plantar fascial tenderness trace dorsal osteophyte over the midfoot.  Posterior tibial tendon is intact.  No plantar foot lesions pulses are normal. Specialty Comments:  No specialty comments available.  Imaging: No results found.   PMFS History: Patient Active Problem List   Diagnosis Date Noted  . Achilles tendinitis, right leg 12/01/2018  . Synovitis of  knee 02/05/2018  . 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 Sepsis due to pneumonia ([facility]) 05/27/2017  . CKD (chronic kidney disease), stage III ([facility]) 05/27/2017  . Diabetes mellitus type 2, uncontrolled ([facility]) 05/27/2017  . Hypothyroid 05/27/2017  . Hypercholesteremia 05/27/2017  . Acute hypokalemia 05/27/2017  . HTN (hypertension) 05/27/2017  . Community acquired pneumonia of left lower lobe of lung ([facility])   . Diabetes mellitus with complication ([facility])   . Type 2 diabetes mellitus ([facility]) 06/27/2015  . Hypertension 06/27/2015  . Hypothyroidism 06/27/2015  . Chest pain 06/27/2015  . Precordial chest pain    Past Medical History:  Diagnosis Date  . Arthritis    "all over my body" (05/27/2017)  . Chronic kidney disease (CKD), stage III (moderate) (HCC)   .  Chronic lower back pain   . Frequent UTI   . GERD (gastroesophageal reflux disease)   . Headache    "monthly" (05/27/2017)  . History of blood transfusion    "when I had my neck fusion"  . History of hiatal hernia   . Hypercholesteremia   . Hypertension   . Hypothyroid   . Pneumonia 2000s X 1; 05/27/2017  . Seasonal allergies   . Type II diabetes mellitus (HCC)     Family History  Problem Relation Age of Onset  . Alzheimer's disease Mother   . Heart attack Father   . Heart disease Father   . Dwarfism Sister   . Colon cancer Neg Hx   . Colon polyps Neg Hx   . Esophageal cancer Neg Hx   . Rectal cancer Neg Hx   . Stomach cancer Neg Hx     Past Surgical History:  Procedure Laterality Date  . BACK SURGERY    . BREAST BIOPSY Left   . CATARACT EXTRACTION W/ INTRAOCULAR LENS  IMPLANT, BILATERAL Bilateral   . COLONOSCOPY  10/25/2005   normal  . DILATION AND CURETTAGE OF UTERUS    . POSTERIOR CERVICAL FUSION/FORAMINOTOMY     "fused 4,5,6"  . TONSILLECTOMY     Social History   Occupational History  . Not on file  Tobacco Use  . Smoking status: Former Smoker    Packs/day: 1.00    Years: 6.00    Pack years: 6.00    Types: Cigarettes    Quit date: 10/01/1964    Years since quitting: 54.5  . Smokeless tobacco: Never Used  Substance and Sexual Activity  . Alcohol use: No  . Drug use: No  . Sexual activity: Never

## 2019-04-17 ENCOUNTER — Ambulatory Visit: Payer: Medicare HMO | Admitting: Podiatry

## 2019-04-29 ENCOUNTER — Ambulatory Visit (INDEPENDENT_AMBULATORY_CARE_PROVIDER_SITE_OTHER): Payer: Medicare HMO | Admitting: Podiatry

## 2019-04-29 ENCOUNTER — Other Ambulatory Visit: Payer: Self-pay

## 2019-04-29 ENCOUNTER — Encounter: Payer: Self-pay | Admitting: Podiatry

## 2019-04-29 VITALS — Temp 97.6°F

## 2019-04-29 DIAGNOSIS — E1142 Type 2 diabetes mellitus with diabetic polyneuropathy: Secondary | ICD-10-CM

## 2019-04-29 DIAGNOSIS — M79675 Pain in left toe(s): Secondary | ICD-10-CM

## 2019-04-29 DIAGNOSIS — B351 Tinea unguium: Secondary | ICD-10-CM

## 2019-04-29 DIAGNOSIS — M79674 Pain in right toe(s): Secondary | ICD-10-CM | POA: Diagnosis not present

## 2019-04-29 NOTE — Progress Notes (Signed)
Complaint:  Visit Type: Patient returns to my office for continued preventative foot care services. Complaint: Patient states" my nails have grown long and thick and become painful to walk and wear shoes" Patient has been diagnosed with DM with no foot complications. The patient presents for preventative foot care services. No changes to ROS  Podiatric Exam: Vascular: dorsalis pedis and posterior tibial pulses are palpable bilateral. Capillary return is immediate. Temperature gradient is WNL. Skin turgor WNL  Sensorium: Normal Semmes Weinstein monofilament test. Normal tactile sensation bilaterally. Nail Exam: Pt has thick disfigured discolored nails with subungual debris noted bilateral entire nail hallux through fifth toenails Ulcer Exam: There is no evidence of ulcer or pre-ulcerative changes or infection. Orthopedic Exam: Muscle tone and strength are WNL. No limitations in general ROM. No crepitus or effusions noted. HAV  B/L.  Pes planus Skin: No Porokeratosis. No infection or ulcers  Diagnosis:  Onychomycosis, , Pain in right toe, pain in left toes  Treatment & Plan Procedures and Treatment: Consent by patient was obtained for treatment procedures.   Debridement of mycotic and hypertrophic toenails, 1 through 5 bilateral and clearing of subungual debris. No ulceration, no infection noted.  Return Visit-Office Procedure: Patient instructed to return to the office for a follow up visit  10 weeks  for continued evaluation and treatment.    Gardiner Barefoot DPM

## 2019-05-11 DIAGNOSIS — H539 Unspecified visual disturbance: Secondary | ICD-10-CM | POA: Diagnosis not present

## 2019-05-11 DIAGNOSIS — E1122 Type 2 diabetes mellitus with diabetic chronic kidney disease: Secondary | ICD-10-CM | POA: Diagnosis not present

## 2019-05-11 DIAGNOSIS — M7661 Achilles tendinitis, right leg: Secondary | ICD-10-CM | POA: Diagnosis not present

## 2019-05-11 DIAGNOSIS — F329 Major depressive disorder, single episode, unspecified: Secondary | ICD-10-CM | POA: Diagnosis not present

## 2019-05-11 DIAGNOSIS — R627 Adult failure to thrive: Secondary | ICD-10-CM | POA: Diagnosis not present

## 2019-05-11 DIAGNOSIS — E663 Overweight: Secondary | ICD-10-CM | POA: Diagnosis not present

## 2019-05-11 DIAGNOSIS — K5900 Constipation, unspecified: Secondary | ICD-10-CM | POA: Diagnosis not present

## 2019-05-11 DIAGNOSIS — I129 Hypertensive chronic kidney disease with stage 1 through stage 4 chronic kidney disease, or unspecified chronic kidney disease: Secondary | ICD-10-CM | POA: Diagnosis not present

## 2019-05-11 DIAGNOSIS — E039 Hypothyroidism, unspecified: Secondary | ICD-10-CM | POA: Diagnosis not present

## 2019-06-11 ENCOUNTER — Ambulatory Visit: Payer: Self-pay

## 2019-06-11 ENCOUNTER — Encounter: Payer: Self-pay | Admitting: Surgery

## 2019-06-11 ENCOUNTER — Ambulatory Visit (INDEPENDENT_AMBULATORY_CARE_PROVIDER_SITE_OTHER): Payer: Medicare HMO | Admitting: Surgery

## 2019-06-11 VITALS — Ht 60.0 in | Wt 140.0 lb

## 2019-06-11 DIAGNOSIS — S93491A Sprain of other ligament of right ankle, initial encounter: Secondary | ICD-10-CM | POA: Diagnosis not present

## 2019-06-11 DIAGNOSIS — M25571 Pain in right ankle and joints of right foot: Secondary | ICD-10-CM | POA: Diagnosis not present

## 2019-06-11 DIAGNOSIS — M7661 Achilles tendinitis, right leg: Secondary | ICD-10-CM | POA: Diagnosis not present

## 2019-06-11 NOTE — Progress Notes (Signed)
Office Visit Note   Patient: Amber Gray           Date of Birth: 1941-07-23           MRN: [id number] Visit Date: 06/11/2019              Requested by: [name], [address] [place],  [place] [address] PCP: [name], MD   Assessment & Plan: Visit Diagnoses:  1. Acute right ankle pain   2. Sprain of anterior talofibular ligament of right ankle, initial encounter   3. Achilles tendinitis, right leg     Plan: With patient's ongoing posterior heel pain that has not gotten any better with wearing the cam boot I recommend referral to Dr. Sharol Given to discuss any potential surgical options.  I advised her to continue wearing the boot until she at least sees him.  I do think that with the inversion injury that she suffered recently to her ankle I think she does have a lateral sprain and I advised her that the boot should help this.  Follow-Up Instructions: Return in about 1 week (around 06/18/2019) for With Dr. Sharol Given to discuss possible surgical treatment right foot.   Orders:  Orders Placed This Encounter  Procedures  . XR Ankle Complete Right   No orders of the defined types were placed in this encounter.     Procedures: No procedures performed   Clinical Data: No additional findings.   Subjective: Chief Complaint  Patient presents with  . Right Ankle - Pain, Injury    HPI 78 year old white female comes in with complaints of right foot lateral ankle pain.  Patient has been wearing a cam boot for Achilles tendinopathy.  States that her pain there is not getting any better.  She has seen Dr. Lorin Mercy to review her MRI that I ordered previously.  New complaint today of lateral ankle pain.  States that earlier this week she got out of bed not wearing her boot when she suffered an inversion injury when she stumbled and she caught herself from hitting the floor.  Localizes new pain to the ATFL area.  She has been back in the boot.  She is wanting to see if there are any  potential surgical options for her Achilles tendon issue. Review of Systems No current cardiac pulmonary GI GU issues  Objective: Vital Signs: Ht 5' (1.524 m)   Wt 140 lb (63.5 kg)   BMI 27.34 kg/m   Physical Exam HENT:     Head: Normocephalic.  Eyes:     Extraocular Movements: Extraocular movements intact.     Pupils: Pupils are equal, round, and reactive to light.  Pulmonary:     Effort: No respiratory distress.  Musculoskeletal:     Comments: Exam gait is antalgic.  She has moderate to market tender over the ATFL.  Also marked tenderness over the Achilles tendon calcaneal insertion.  Neurovascular intact.  Skin:    General: Skin is warm and dry.  Neurological:     Mental Status: She is alert.  Psychiatric:        Mood and Affect: Mood normal.     Ortho Exam  Specialty Comments:  No specialty comments available.  Imaging: No results found.   PMFS History: Patient Active Problem List   Diagnosis Date Noted  . Achilles tendinitis, right leg 12/01/2018  . Synovitis of knee 02/05/2018  . 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 Sepsis due to pneumonia ([place]) 05/27/2017  .  CKD (chronic kidney disease), stage III ([place]) 05/27/2017  . Diabetes mellitus type 2, uncontrolled ([place]) 05/27/2017  . Hypothyroid 05/27/2017  . Hypercholesteremia 05/27/2017  . Acute hypokalemia 05/27/2017  . HTN (hypertension) 05/27/2017  . Community acquired pneumonia of left lower lobe of lung ([place])   . Diabetes mellitus with complication ([facility])   . Type 2 diabetes mellitus ([place]) 06/27/2015  . Hypertension 06/27/2015  . Hypothyroidism 06/27/2015  . Chest pain 06/27/2015  . Precordial chest pain    Past Medical History:  Diagnosis Date  . Arthritis    "all over my body" (05/27/2017)  . Chronic kidney disease (CKD), stage III (moderate) (HCC)   . Chronic lower back pain   . Frequent UTI   . GERD (gastroesophageal reflux disease)   . Headache    "monthly" (05/27/2017)  .  History of blood transfusion    "when I had my neck fusion"  . History of hiatal hernia   . Hypercholesteremia   . Hypertension   . Hypothyroid   . Pneumonia 2000s X 1; 05/27/2017  . Seasonal allergies   . Type II diabetes mellitus (HCC)     Family History  Problem Relation Age of Onset  . Alzheimer's disease Mother   . Heart attack Father   . Heart disease Father   . Dwarfism Sister   . Colon cancer Neg Hx   . Colon polyps Neg Hx   . Esophageal cancer Neg Hx   . Rectal cancer Neg Hx   . Stomach cancer Neg Hx     Past Surgical History:  Procedure Laterality Date  . BACK SURGERY    . BREAST BIOPSY Left   . CATARACT EXTRACTION W/ INTRAOCULAR LENS  IMPLANT, BILATERAL Bilateral   . COLONOSCOPY  10/25/2005   normal  . DILATION AND CURETTAGE OF UTERUS    . POSTERIOR CERVICAL FUSION/FORAMINOTOMY     "fused 4,5,6"  . TONSILLECTOMY     Social History   Occupational History  . Not on file  Tobacco Use  . Smoking status: Former Smoker    Packs/day: 1.00    Years: 6.00    Pack years: 6.00    Types: Cigarettes    Quit date: 10/01/1964    Years since quitting: 54.7  . Smokeless tobacco: Never Used  Substance and Sexual Activity  . Alcohol use: No  . Drug use: No  . Sexual activity: Never

## 2019-06-18 ENCOUNTER — Ambulatory Visit (INDEPENDENT_AMBULATORY_CARE_PROVIDER_SITE_OTHER): Payer: Medicare HMO | Admitting: Orthopedic Surgery

## 2019-06-18 ENCOUNTER — Encounter: Payer: Self-pay | Admitting: Orthopedic Surgery

## 2019-06-18 VITALS — Ht 60.0 in | Wt 140.0 lb

## 2019-06-18 DIAGNOSIS — M25871 Other specified joint disorders, right ankle and foot: Secondary | ICD-10-CM

## 2019-06-18 DIAGNOSIS — M19071 Primary osteoarthritis, right ankle and foot: Secondary | ICD-10-CM

## 2019-06-20 ENCOUNTER — Encounter: Payer: Self-pay | Admitting: Orthopedic Surgery

## 2019-06-20 DIAGNOSIS — M25871 Other specified joint disorders, right ankle and foot: Secondary | ICD-10-CM

## 2019-06-20 MED ORDER — METHYLPREDNISOLONE ACETATE 40 MG/ML IJ SUSP
40.0000 mg | INTRAMUSCULAR | Status: AC | PRN
  Administered 2019-06-20: 40 mg via INTRA_ARTICULAR

## 2019-06-20 MED ORDER — LIDOCAINE HCL 1 % IJ SOLN
2.0000 mL | INTRAMUSCULAR | Status: AC | PRN
  Administered 2019-06-20: 2 mL

## 2019-06-20 NOTE — Progress Notes (Signed)
Office Visit Note   Patient: Amber Gray           Date of Birth: August 20, 1941           MRN: [id number] Visit Date: 06/18/2019              Requested by: [name], [place] [place],  [place] [address] PCP: [name], MD  Chief Complaint  Patient presents with  . Right Foot - Pain, New Patient (Initial Visit)  . Right Ankle - Pain      HPI: Patient is a 78 year old woman who was seen for initial evaluation in referral from Benjiman Core.  Patient has had an MRI scan of the heel in July and radiographs in September.  Patient is currently weightbearing with a Cam walker.  She complains of the pain 6-8 over 10 she states she uses Tylenol arthritis cream and has been in the fracture boot for 9 weeks.  Patient states she has had about 2 years of pain in the ankle.  Patient states she is  had a twisting injury while getting up out of bed.  Assessment & Plan: Visit Diagnoses:  1. Impingement syndrome of right ankle   2. Arthritis of right ankle     Plan: Ankle was injected she tolerated this well discussed that she does have traumatic arthritis with narrowing of the medial joint line of the ankle with varus tilt of the talus.  Patient may benefit from arthroscopic debridement reevaluate in follow-up  Follow-Up Instructions: Return in about 3 weeks (around 07/09/2019).   Ortho Exam  Patient is alert, oriented, no adenopathy, well-dressed, normal affect, normal respiratory effort. Examination patient has a good dorsalis pedis pulse she has a negative anterior drawer she has swelling anteriorly over the ankle and is tender to palpation over the anterior ankle joint including the medial and lateral gutters.  The subtalar joint is nontender no pain with varus and valgus stress.  Review of the radiographs shows joint space narrowing of the medial joint line of the tibial talar joint with varus tilt of the talus  Imaging: No results found. No images are attached to the  encounter.  Labs: Lab Results  Component Value Date   HGBA1C 7.8 (H) 06/27/2015   REPTSTATUS 05/27/2017 FINAL 05/27/2017   CULT NO GROWTH 05/27/2017     Lab Results  Component Value Date   ALBUMIN 3.0 (L) 05/28/2017   ALBUMIN 3.2 (L) 05/27/2017   ALBUMIN 4.1 09/25/2012    Lab Results  Component Value Date   MG 2.2 05/31/2017   MG 2.4 05/30/2017   MG 2.1 05/27/2017   No results found for: VD25OH  No results found for: PREALBUMIN CBC EXTENDED Latest Ref Rng & Units 05/30/2017 05/28/2017 05/27/2017  WBC 4.0 - 10.5 K/uL 15.1(H) 15.0(H) 12.6(H)  RBC 3.87 - 5.11 MIL/uL 3.97 3.73(L) 4.38  HGB 12.0 - 15.0 g/dL 11.3(L) 10.9(L) 13.3  HCT 36.0 - 46.0 % 37.1 33.9(L) 39.1  PLT 150 - 400 K/uL 206 173 271  NEUTROABS 1.7 - 7.7 K/uL 13.7(H) - 8.7(H)  LYMPHSABS 0.7 - 4.0 K/uL 0.9 - 2.9     Body mass index is 27.34 kg/m.  Orders:  No orders of the defined types were placed in this encounter.  No orders of the defined types were placed in this encounter.    Procedures: Medium Joint Inj: R ankle on 06/20/2019 10:59 AM Indications: pain and diagnostic evaluation Details: 22 G 1.5 in needle, anteromedial approach Medications:  2 mL lidocaine 1 %; 40 mg methylPREDNISolone acetate 40 MG/ML Outcome: tolerated well, no immediate complications Procedure, treatment alternatives, risks and benefits explained, specific risks discussed. Consent was given by the patient. Immediately prior to procedure a time out was called to verify the correct patient, procedure, equipment, support staff and site/side marked as required. Patient was prepped and draped in the usual sterile fashion.      Clinical Data: No additional findings.  ROS:  All other systems negative, except as noted in the HPI. Review of Systems  Objective: Vital Signs: Ht 5' (1.524 m)   Wt 140 lb (63.5 kg)   BMI 27.34 kg/m   Specialty Comments:  No specialty comments available.  PMFS History: Patient Active Problem  List   Diagnosis Date Noted  . Achilles tendinitis, right leg 12/01/2018  . Synovitis of knee 02/05/2018  . 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 Sepsis due to pneumonia ([facility]) 05/27/2017  . CKD (chronic kidney disease), stage III ([facility]) 05/27/2017  . Diabetes mellitus type 2, uncontrolled ([facility]) 05/27/2017  . Hypothyroid 05/27/2017  . Hypercholesteremia 05/27/2017  . Acute hypokalemia 05/27/2017  . HTN (hypertension) 05/27/2017  . Community acquired pneumonia of left lower lobe of lung ([facility])   . Diabetes mellitus with complication ([facility])   . Type 2 diabetes mellitus ([facility]) 06/27/2015  . Hypertension 06/27/2015  . Hypothyroidism 06/27/2015  . Chest pain 06/27/2015  . Precordial chest pain    Past Medical History:  Diagnosis Date  . Arthritis    "all over my body" (05/27/2017)  . Chronic kidney disease (CKD), stage III (moderate) (HCC)   . Chronic lower back pain   . Frequent UTI   . GERD (gastroesophageal reflux disease)   . Headache    "monthly" (05/27/2017)  . History of blood transfusion    "when I had my neck fusion"  . History of hiatal hernia   . Hypercholesteremia   . Hypertension   . Hypothyroid   . Pneumonia 2000s X 1; 05/27/2017  . Seasonal allergies   . Type II diabetes mellitus (HCC)     Family History  Problem Relation Age of Onset  . Alzheimer's disease Mother   . Heart attack Father   . Heart disease Father   . Dwarfism Sister   . Colon cancer Neg Hx   . Colon polyps Neg Hx   . Esophageal cancer Neg Hx   . Rectal cancer Neg Hx   . Stomach cancer Neg Hx     Past Surgical History:  Procedure Laterality Date  . BACK SURGERY    . BREAST BIOPSY Left   . CATARACT EXTRACTION W/ INTRAOCULAR LENS  IMPLANT, BILATERAL Bilateral   . COLONOSCOPY  10/25/2005   normal  . DILATION AND CURETTAGE OF UTERUS    . POSTERIOR CERVICAL FUSION/FORAMINOTOMY     "fused 4,5,6"  . TONSILLECTOMY     Social History   Occupational History  . Not on file   Tobacco Use  . Smoking status: Former Smoker    Packs/day: 1.00    Years: 6.00    Pack years: 6.00    Types: Cigarettes    Quit date: 10/01/1964    Years since quitting: 54.7  . Smokeless tobacco: Never Used  Substance and Sexual Activity  . Alcohol use: No  . Drug use: No  . Sexual activity: Never

## 2019-07-09 ENCOUNTER — Ambulatory Visit: Payer: Medicare HMO | Admitting: Surgery

## 2019-07-10 ENCOUNTER — Ambulatory Visit: Payer: Medicare HMO | Admitting: Orthopaedic Surgery

## 2019-07-14 ENCOUNTER — Ambulatory Visit: Payer: Medicare HMO | Admitting: Podiatry

## 2019-07-16 ENCOUNTER — Ambulatory Visit (INDEPENDENT_AMBULATORY_CARE_PROVIDER_SITE_OTHER): Payer: Medicare HMO | Admitting: Orthopedic Surgery

## 2019-07-16 ENCOUNTER — Encounter: Payer: Self-pay | Admitting: Orthopedic Surgery

## 2019-07-16 ENCOUNTER — Other Ambulatory Visit: Payer: Self-pay

## 2019-07-16 VITALS — Ht 60.0 in | Wt 140.0 lb

## 2019-07-16 DIAGNOSIS — M19071 Primary osteoarthritis, right ankle and foot: Secondary | ICD-10-CM

## 2019-07-16 DIAGNOSIS — M25871 Other specified joint disorders, right ankle and foot: Secondary | ICD-10-CM

## 2019-07-17 DIAGNOSIS — M25871 Other specified joint disorders, right ankle and foot: Secondary | ICD-10-CM | POA: Diagnosis not present

## 2019-07-17 MED ORDER — LIDOCAINE HCL 1 % IJ SOLN
2.0000 mL | INTRAMUSCULAR | Status: AC | PRN
  Administered 2019-07-17: 2 mL

## 2019-07-17 MED ORDER — METHYLPREDNISOLONE ACETATE 40 MG/ML IJ SUSP
40.0000 mg | INTRAMUSCULAR | Status: AC | PRN
  Administered 2019-07-17: 40 mg via INTRA_ARTICULAR

## 2019-07-17 NOTE — Progress Notes (Signed)
Office Visit Note   Patient: IZZIE Gray           Date of Birth: 1940/11/01           MRN: [id number] Visit Date: 07/16/2019              Requested by: [name], [address] [place],  [place] [address] PCP: [name], MD  Chief Complaint  Patient presents with  . Right Knee - Follow-up, Pain      HPI: Patient is a 78 year old woman who presents in follow-up for impingement symptoms of her right ankle.  Patient states that she had an injection about a month ago which did provide her good relief she states the pain is just recurred.  Patient currently ambulates with a walker.  Patient states she is having some swelling.  Assessment & Plan: Visit Diagnoses: No diagnosis found.  Plan: Ankle was injected she tolerated this well recommended exercise and activities to maintain strength around the ankle.  Follow-Up Instructions: Return if symptoms worsen or fail to improve.   Ortho Exam  Patient is alert, oriented, no adenopathy, well-dressed, normal affect, normal respiratory effort. Examination patient is has good ankle and subtalar motion.  She is tender to palpation over the anterior joint line.  There is no redness no cellulitis.  Ankle was injected without complication.  Imaging: No results found. No images are attached to the encounter.  Labs: Lab Results  Component Value Date   HGBA1C 7.8 (H) 06/27/2015   REPTSTATUS 05/27/2017 FINAL 05/27/2017   CULT NO GROWTH 05/27/2017     Lab Results  Component Value Date   ALBUMIN 3.0 (L) 05/28/2017   ALBUMIN 3.2 (L) 05/27/2017   ALBUMIN 4.1 09/25/2012    Lab Results  Component Value Date   MG 2.2 05/31/2017   MG 2.4 05/30/2017   MG 2.1 05/27/2017   No results found for: VD25OH  No results found for: PREALBUMIN CBC EXTENDED Latest Ref Rng & Units 05/30/2017 05/28/2017 05/27/2017  WBC 4.0 - 10.5 K/uL 15.1(H) 15.0(H) 12.6(H)  RBC 3.87 - 5.11 MIL/uL 3.97 3.73(L) 4.38  HGB 12.0 - 15.0 g/dL 11.3(L)  10.9(L) 13.3  HCT 36.0 - 46.0 % 37.1 33.9(L) 39.1  PLT 150 - 400 K/uL 206 173 271  NEUTROABS 1.7 - 7.7 K/uL 13.7(H) - 8.7(H)  LYMPHSABS 0.7 - 4.0 K/uL 0.9 - 2.9     Body mass index is 27.34 kg/m.  Orders:  No orders of the defined types were placed in this encounter.  No orders of the defined types were placed in this encounter.    Procedures: Medium Joint Inj: R ankle on 07/17/2019 3:53 PM Indications: pain and diagnostic evaluation Details: 22 G 1.5 in needle, anteromedial approach Medications: 2 mL lidocaine 1 %; 40 mg methylPREDNISolone acetate 40 MG/ML Outcome: tolerated well, no immediate complications Procedure, treatment alternatives, risks and benefits explained, specific risks discussed. Consent was given by the patient. Immediately prior to procedure a time out was called to verify the correct patient, procedure, equipment, support staff and site/side marked as required. Patient was prepped and draped in the usual sterile fashion.      Clinical Data: No additional findings.  ROS:  All other systems negative, except as noted in the HPI. Review of Systems  Objective: Vital Signs: Ht 5' (1.524 m)   Wt 140 lb (63.5 kg)   BMI 27.34 kg/m   Specialty Comments:  No specialty comments available.  PMFS History: Patient Active Problem List  Diagnosis Date Noted  . Achilles tendinitis, right leg 12/01/2018  . Synovitis of knee 02/05/2018  . 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 Sepsis due to pneumonia ([facility]) 05/27/2017  . CKD (chronic kidney disease), stage III 05/27/2017  . Diabetes mellitus type 2, uncontrolled ([facility]) 05/27/2017  . Hypothyroid 05/27/2017  . Hypercholesteremia 05/27/2017  . Acute hypokalemia 05/27/2017  . HTN (hypertension) 05/27/2017  . Community acquired pneumonia of left lower lobe of lung   . Diabetes mellitus with complication ([facility])   . Type 2 diabetes mellitus ([facility]) 06/27/2015  . Hypertension 06/27/2015  .  Hypothyroidism 06/27/2015  . Chest pain 06/27/2015  . Precordial chest pain    Past Medical History:  Diagnosis Date  . Arthritis    "all over my body" (05/27/2017)  . Chronic kidney disease (CKD), stage III (moderate)   . Chronic lower back pain   . Frequent UTI   . GERD (gastroesophageal reflux disease)   . Headache    "monthly" (05/27/2017)  . History of blood transfusion    "when I had my neck fusion"  . History of hiatal hernia   . Hypercholesteremia   . Hypertension   . Hypothyroid   . Pneumonia 2000s X 1; 05/27/2017  . Seasonal allergies   . Type II diabetes mellitus (HCC)     Family History  Problem Relation Age of Onset  . Alzheimer's disease Mother   . Heart attack Father   . Heart disease Father   . Dwarfism Sister   . Colon cancer Neg Hx   . Colon polyps Neg Hx   . Esophageal cancer Neg Hx   . Rectal cancer Neg Hx   . Stomach cancer Neg Hx     Past Surgical History:  Procedure Laterality Date  . BACK SURGERY    . BREAST BIOPSY Left   . CATARACT EXTRACTION W/ INTRAOCULAR LENS  IMPLANT, BILATERAL Bilateral   . COLONOSCOPY  10/25/2005   normal  . DILATION AND CURETTAGE OF UTERUS    . POSTERIOR CERVICAL FUSION/FORAMINOTOMY     "fused 4,5,6"  . TONSILLECTOMY     Social History   Occupational History  . Not on file  Tobacco Use  . Smoking status: Former Smoker    Packs/day: 1.00    Years: 6.00    Pack years: 6.00    Types: Cigarettes    Quit date: 10/01/1964    Years since quitting: 54.8  . Smokeless tobacco: Never Used  Substance and Sexual Activity  . Alcohol use: No  . Drug use: No  . Sexual activity: Never

## 2019-07-30 DIAGNOSIS — R627 Adult failure to thrive: Secondary | ICD-10-CM | POA: Diagnosis not present

## 2019-07-30 DIAGNOSIS — F329 Major depressive disorder, single episode, unspecified: Secondary | ICD-10-CM | POA: Diagnosis not present

## 2019-07-30 DIAGNOSIS — R490 Dysphonia: Secondary | ICD-10-CM | POA: Diagnosis not present

## 2019-07-30 DIAGNOSIS — E1122 Type 2 diabetes mellitus with diabetic chronic kidney disease: Secondary | ICD-10-CM | POA: Diagnosis not present

## 2019-07-30 DIAGNOSIS — N1832 Chronic kidney disease, stage 3b: Secondary | ICD-10-CM | POA: Diagnosis not present

## 2019-07-30 DIAGNOSIS — E039 Hypothyroidism, unspecified: Secondary | ICD-10-CM | POA: Diagnosis not present

## 2019-07-30 DIAGNOSIS — M7661 Achilles tendinitis, right leg: Secondary | ICD-10-CM | POA: Diagnosis not present

## 2019-07-30 DIAGNOSIS — I129 Hypertensive chronic kidney disease with stage 1 through stage 4 chronic kidney disease, or unspecified chronic kidney disease: Secondary | ICD-10-CM | POA: Diagnosis not present

## 2019-08-06 ENCOUNTER — Telehealth: Payer: Self-pay | Admitting: Orthopedic Surgery

## 2019-08-06 NOTE — Telephone Encounter (Signed)
Patient called triage phone. She received a right ankle cortisone injection on 07/16/2019. She states that she is hurting again and wants another injection. Please call patient to advise if and when she can receive another injection. Call back [phone] Thanks!

## 2019-08-06 NOTE — Telephone Encounter (Signed)
Please see message below and advise when pt can have another injections.

## 2019-08-07 NOTE — Telephone Encounter (Signed)
We could inject in z month

## 2019-08-12 DIAGNOSIS — J45990 Exercise induced bronchospasm: Secondary | ICD-10-CM | POA: Diagnosis not present

## 2019-08-12 DIAGNOSIS — R05 Cough: Secondary | ICD-10-CM | POA: Diagnosis not present

## 2019-08-12 NOTE — Telephone Encounter (Signed)
I tried to call patient but did not get an answer nor could leave a voicemail about her injection. Will try again.

## 2019-08-13 NOTE — Telephone Encounter (Signed)
Can you please call pt and make an appt for next month.

## 2019-09-14 ENCOUNTER — Encounter: Payer: Self-pay | Admitting: Orthopedic Surgery

## 2019-09-14 ENCOUNTER — Ambulatory Visit (INDEPENDENT_AMBULATORY_CARE_PROVIDER_SITE_OTHER): Payer: Medicare HMO | Admitting: Orthopedic Surgery

## 2019-09-14 ENCOUNTER — Other Ambulatory Visit: Payer: Self-pay

## 2019-09-14 VITALS — Ht 60.0 in | Wt 140.0 lb

## 2019-09-14 DIAGNOSIS — M76821 Posterior tibial tendinitis, right leg: Secondary | ICD-10-CM

## 2019-09-14 DIAGNOSIS — M25871 Other specified joint disorders, right ankle and foot: Secondary | ICD-10-CM

## 2019-09-14 MED ORDER — LIDOCAINE HCL 1 % IJ SOLN
2.0000 mL | INTRAMUSCULAR | Status: AC | PRN
  Administered 2019-09-14: 2 mL

## 2019-09-14 MED ORDER — METHYLPREDNISOLONE ACETATE 40 MG/ML IJ SUSP
40.0000 mg | INTRAMUSCULAR | Status: AC | PRN
  Administered 2019-09-14: 40 mg via INTRA_ARTICULAR

## 2019-09-14 NOTE — Progress Notes (Signed)
Office Visit Note   Patient: Amber Gray           Date of Birth: [birth year]           MRN: [id number] Visit Date: 09/14/2019              Requested by: [name], [address] [place],  [place] [address] PCP: [name], MD  Chief Complaint  Patient presents with  . Right Ankle - Follow-up, Pain    Right ankle Cortisone Inj      HPI: Patient is a 78 year old woman who presents in follow-up for her right ankle.  Patient states she has had good interval relief with previous intra-articular ankle injections.  Patient states that she is currently wearing knee-high medical compression stockings which help and she states her pain is now primarily over the posterior tibial tendon.  Assessment & Plan: Visit Diagnoses:  1. Impingement syndrome of right ankle   2. Posterior tibial tendinitis, right leg     Plan:  Use her fracture boot when the posterior tibial tendon flares up.  Discussed that she has 2 separate issues.  We will continue with conservative treatment for both the posterior tibial tendon insufficiency and the impingement of the ankle.  Follow-Up Instructions: Return in about 4 weeks (around 10/12/2019).   Ortho Exam  Patient is alert, oriented, no adenopathy, well-dressed, normal affect, normal respiratory effort.  Patient will examination patient has a good pulse she has pronation and valgus of the right foot she is tender to palpation over the ankle joint as well as over the posterior tibial tendon.  Patient cannot do a single limb heel raise.  There is swelling over the posterior tibial tendon she is directly tender to palpation over the anterior aspect of the ankle.  Imaging: No results found. No images are attached to the encounter.  Labs: Lab Results  Component Value Date   HGBA1C 7.8 (H) 06/27/2015   REPTSTATUS 05/27/2017 FINAL 05/27/2017   CULT NO GROWTH 05/27/2017     Lab Results  Component Value Date   ALBUMIN 3.0 (L) 05/28/2017   ALBUMIN 3.2 (L) 05/27/2017   ALBUMIN 4.1 09/25/2012    Lab Results  Component Value Date   MG 2.2 05/31/2017   MG 2.4 05/30/2017   MG 2.1 05/27/2017   No results found for: VD25OH  No results found for: PREALBUMIN CBC EXTENDED Latest Ref Rng & Units 05/30/2017 05/28/2017 05/27/2017  WBC 4.0 - 10.5 K/uL 15.1(H) 15.0(H) 12.6(H)  RBC 3.87 - 5.11 MIL/uL 3.97 3.73(L) 4.38  HGB 12.0 - 15.0 g/dL 11.3(L) 10.9(L) 13.3  HCT 36.0 - 46.0 % 37.1 33.9(L) 39.1  PLT 150 - 400 K/uL 206 173 271  NEUTROABS 1.7 - 7.7 K/uL 13.7(H) - 8.7(H)  LYMPHSABS 0.7 - 4.0 K/uL 0.9 - 2.9     Body mass index is 27.34 kg/m.  Orders:  No orders of the defined types were placed in this encounter.  No orders of the defined types were placed in this encounter.    Procedures: Medium Joint Inj: R ankle on 09/14/2019 1:25 PM Indications: pain and diagnostic evaluation Details: 22 G 1.5 in needle, anteromedial approach Medications: 2 mL lidocaine 1 %; 40 mg methylPREDNISolone acetate 40 MG/ML Outcome: tolerated well, no immediate complications Procedure, treatment alternatives, risks and benefits explained, specific risks discussed. Consent was given by the patient. Immediately prior to procedure a time out was called to verify the correct patient, procedure, equipment, support staff and site/side marked  as required. Patient was prepped and draped in the usual sterile fashion.      Clinical Data: No additional findings.  ROS:  All other systems negative, except as noted in the HPI. Review of Systems  Objective: Vital Signs: Ht 5' (1.524 m)   Wt 140 lb (63.5 kg)   BMI 27.34 kg/m   Specialty Comments:  No specialty comments available.  PMFS History: Patient Active Problem List   Diagnosis Date Noted  . Achilles tendinitis, right leg 12/01/2018  . Synovitis of knee 02/05/2018  . 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 Sepsis due to pneumonia ([facility]) 05/27/2017  . CKD (chronic kidney  disease), stage III 05/27/2017  . Diabetes mellitus type 2, uncontrolled ([facility]) 05/27/2017  . Hypothyroid 05/27/2017  . Hypercholesteremia 05/27/2017  . Acute hypokalemia 05/27/2017  . HTN (hypertension) 05/27/2017  . Community acquired pneumonia of left lower lobe of lung   . Diabetes mellitus with complication ([facility])   . Type 2 diabetes mellitus ([facility]) 06/27/2015  . Hypertension 06/27/2015  . Hypothyroidism 06/27/2015  . Chest pain 06/27/2015  . Precordial chest pain    Past Medical History:  Diagnosis Date  . Arthritis    "all over my body" (05/27/2017)  . Chronic kidney disease (CKD), stage III (moderate)   . Chronic lower back pain   . Frequent UTI   . GERD (gastroesophageal reflux disease)   . Headache    "monthly" (05/27/2017)  . History of blood transfusion    "when I had my neck fusion"  . History of hiatal hernia   . Hypercholesteremia   . Hypertension   . Hypothyroid   . Pneumonia 2000s X 1; 05/27/2017  . Seasonal allergies   . Type II diabetes mellitus (HCC)     Family History  Problem Relation Age of Onset  . Alzheimer's disease Mother   . Heart attack Father   . Heart disease Father   . Dwarfism Sister   . Colon cancer Neg Hx   . Colon polyps Neg Hx   . Esophageal cancer Neg Hx   . Rectal cancer Neg Hx   . Stomach cancer Neg Hx     Past Surgical History:  Procedure Laterality Date  . BACK SURGERY    . BREAST BIOPSY Left   . CATARACT EXTRACTION W/ INTRAOCULAR LENS  IMPLANT, BILATERAL Bilateral   . COLONOSCOPY  10/25/2005   normal  . DILATION AND CURETTAGE OF UTERUS    . POSTERIOR CERVICAL FUSION/FORAMINOTOMY     "fused 4,5,6"  . TONSILLECTOMY     Social History   Occupational History  . Not on file  Tobacco Use  . Smoking status: Former Smoker    Packs/day: 1.00    Years: 6.00    Pack years: 6.00    Types: Cigarettes    Quit date: 10/01/1964    Years since quitting: 54.9  . Smokeless tobacco: Never Used  Substance and Sexual Activity  .  Alcohol use: No  . Drug use: No  . Sexual activity: Never

## 2019-10-12 ENCOUNTER — Encounter: Payer: Self-pay | Admitting: Orthopedic Surgery

## 2019-10-12 ENCOUNTER — Ambulatory Visit (INDEPENDENT_AMBULATORY_CARE_PROVIDER_SITE_OTHER): Payer: Medicare Other | Admitting: Orthopedic Surgery

## 2019-10-12 ENCOUNTER — Other Ambulatory Visit: Payer: Self-pay

## 2019-10-12 VITALS — Ht 60.0 in | Wt 140.0 lb

## 2019-10-12 DIAGNOSIS — M25571 Pain in right ankle and joints of right foot: Secondary | ICD-10-CM

## 2019-10-12 MED ORDER — LIDOCAINE HCL 1 % IJ SOLN
2.0000 mL | INTRAMUSCULAR | Status: AC | PRN
  Administered 2019-10-12: 2 mL

## 2019-10-12 MED ORDER — METHYLPREDNISOLONE ACETATE 40 MG/ML IJ SUSP
40.0000 mg | INTRAMUSCULAR | Status: AC | PRN
  Administered 2019-10-12: 40 mg via INTRA_ARTICULAR

## 2019-10-12 NOTE — Progress Notes (Signed)
Office Visit Note   Patient: Amber Gray           Date of Birth: [birth year]           MRN: [id number] Visit Date: 10/12/2019              Requested by: [name], [address] [place],  [place] [address] PCP: [name], MD  Chief Complaint  Patient presents with  . Right Ankle - Follow-up    Ankle impingement and posterior tibial tendonitis.       HPI: This is a pleasant 79 year old woman.  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  She has periodically gotten injections into her ankle joint and is requesting one today  Assessment & Plan: Visit Diagnoses: No diagnosis found.  Plan: She may follow-up as needed.  Follow-Up Instructions: No follow-ups on file.   Ortho Exam  Patient is alert, oriented, no adenopathy, well-dressed, normal affect, normal respiratory effort. Right ankle mild soft tissue swelling no cellulitis distal pulses are intact  Imaging: No results found. No images are attached to the encounter.  Labs: Lab Results  Component Value Date   HGBA1C 7.8 (H) 06/27/2015   REPTSTATUS 05/27/2017 FINAL 05/27/2017   CULT NO GROWTH 05/27/2017     Lab Results  Component Value Date   ALBUMIN 3.0 (L) 05/28/2017   ALBUMIN 3.2 (L) 05/27/2017   ALBUMIN 4.1 09/25/2012    Lab Results  Component Value Date   MG 2.2 05/31/2017   MG 2.4 05/30/2017   MG 2.1 05/27/2017   No results found for: VD25OH  No results found for: PREALBUMIN CBC EXTENDED Latest Ref Rng & Units 05/30/2017 05/28/2017 05/27/2017  WBC 4.0 - 10.5 K/uL 15.1(H) 15.0(H) 12.6(H)  RBC 3.87 - 5.11 MIL/uL 3.97 3.73(L) 4.38  HGB 12.0 - 15.0 g/dL 11.3(L) 10.9(L) 13.3  HCT 36.0 - 46.0 % 37.1 33.9(L) 39.1  PLT 150 - 400 K/uL 206 173 271  NEUTROABS 1.7 - 7.7 K/uL 13.7(H) - 8.7(H)  LYMPHSABS 0.7 - 4.0 K/uL 0.9 - 2.9     Body mass index is 27.34 kg/m.  Orders:  No orders of the defined types were placed in this encounter.  No orders of  the defined types were placed in this encounter.    Procedures: Medium Joint Inj on 10/12/2019 2:05 PM Indications: pain and diagnostic evaluation Details: 22 G 1.5 in needle, anteromedial approach Medications: 2 mL lidocaine 1 %; 40 mg methylPREDNISolone acetate 40 MG/ML Outcome: tolerated well, no immediate complications Procedure, treatment alternatives, risks and benefits explained, specific risks discussed. Consent was given by the patient. Immediately prior to procedure a time out was called to verify the correct patient, procedure, equipment, support staff and site/side marked as required. Patient was prepped and draped in the usual sterile fashion.      Clinical Data: No additional findings.  ROS:  All other systems negative, except as noted in the HPI. Review of Systems  Objective: Vital Signs: Ht 5' (1.524 m)   Wt 140 lb (63.5 kg)   BMI 27.34 kg/m   Specialty Comments:  No specialty comments available.  PMFS History: Patient Active Problem List   Diagnosis Date Noted  . Achilles tendinitis, right leg 12/01/2018  . Synovitis of knee 02/05/2018  . 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 Sepsis due to pneumonia ([place]) 05/27/2017  . CKD (chronic kidney disease), stage III 05/27/2017  . Diabetes mellitus type 2, uncontrolled ([place]) 05/27/2017  .  Hypothyroid 05/27/2017  . Hypercholesteremia 05/27/2017  . Acute hypokalemia 05/27/2017  . HTN (hypertension) 05/27/2017  . Community acquired pneumonia of left lower lobe of lung   . Diabetes mellitus with complication ([place])   . Type 2 diabetes mellitus ([place]) 06/27/2015  . Hypertension 06/27/2015  . Hypothyroidism 06/27/2015  . Chest pain 06/27/2015  . Precordial chest pain    Past Medical History:  Diagnosis Date  . Arthritis    "all over my body" (05/27/2017)  . Chronic kidney disease (CKD), stage III (moderate)   . Chronic lower back pain   . Frequent UTI   . GERD (gastroesophageal reflux disease)    . Headache    "monthly" (05/27/2017)  . History of blood transfusion    "when I had my neck fusion"  . History of hiatal hernia   . Hypercholesteremia   . Hypertension   . Hypothyroid   . Pneumonia 2000s X 1; 05/27/2017  . Seasonal allergies   . Type II diabetes mellitus (HCC)     Family History  Problem Relation Age of Onset  . Alzheimer's disease Mother   . Heart attack Father   . Heart disease Father   . Dwarfism Sister   . Colon cancer Neg Hx   . Colon polyps Neg Hx   . Esophageal cancer Neg Hx   . Rectal cancer Neg Hx   . Stomach cancer Neg Hx     Past Surgical History:  Procedure Laterality Date  . BACK SURGERY    . BREAST BIOPSY Left   . CATARACT EXTRACTION W/ INTRAOCULAR LENS  IMPLANT, BILATERAL Bilateral   . COLONOSCOPY  10/25/2005   normal  . DILATION AND CURETTAGE OF UTERUS    . POSTERIOR CERVICAL FUSION/FORAMINOTOMY     "fused 4,5,6"  . TONSILLECTOMY     Social History   Occupational History  . Not on file  Tobacco Use  . Smoking status: Former Smoker    Packs/day: 1.00    Years: 6.00    Pack years: 6.00    Types: Cigarettes    Quit date: 10/01/1964    Years since quitting: 55.0  . Smokeless tobacco: Never Used  Substance and Sexual Activity  . Alcohol use: No  . Drug use: No  . Sexual activity: Never

## 2019-12-04 ENCOUNTER — Ambulatory Visit
Admission: RE | Admit: 2019-12-04 | Discharge: 2019-12-04 | Disposition: A | Discharge status: Home / Self Care | Payer: Medicare Other | Source: Ambulatory Visit | Attending: Physician Assistant | Admitting: Physician Assistant

## 2019-12-04 ENCOUNTER — Other Ambulatory Visit: Payer: Self-pay

## 2019-12-04 ENCOUNTER — Other Ambulatory Visit: Payer: Self-pay | Admitting: Physician Assistant

## 2019-12-04 DIAGNOSIS — R053 Chronic cough: Secondary | ICD-10-CM

## 2019-12-04 DIAGNOSIS — R05 Cough: Secondary | ICD-10-CM

## 2019-12-22 ENCOUNTER — Ambulatory Visit (INDEPENDENT_AMBULATORY_CARE_PROVIDER_SITE_OTHER): Payer: Medicare Other | Admitting: Orthopedic Surgery

## 2019-12-22 ENCOUNTER — Encounter: Payer: Self-pay | Admitting: Orthopedic Surgery

## 2019-12-22 ENCOUNTER — Other Ambulatory Visit: Payer: Self-pay

## 2019-12-22 VITALS — Ht 60.0 in | Wt 140.0 lb

## 2019-12-22 DIAGNOSIS — M25871 Other specified joint disorders, right ankle and foot: Secondary | ICD-10-CM

## 2019-12-22 MED ORDER — LIDOCAINE HCL 1 % IJ SOLN
2.0000 mL | INTRAMUSCULAR | Status: AC | PRN
  Administered 2019-12-22: 2 mL

## 2019-12-22 MED ORDER — METHYLPREDNISOLONE ACETATE 40 MG/ML IJ SUSP
40.0000 mg | INTRAMUSCULAR | Status: AC | PRN
  Administered 2019-12-22: 40 mg via INTRA_ARTICULAR

## 2019-12-22 NOTE — Progress Notes (Signed)
Office Visit Note   Patient: Amber Gray           Date of Birth: 1940-10-27           MRN: [id number] Visit Date: 12/22/2019              Requested by: [name], [address] [place],  [place] [address] PCP: [name], MD  Chief Complaint  Patient presents with  . Right Ankle - Follow-up    S/p right ankle injection 07/16/2019      HPI: Patient is a 79 year old woman who presents in follow-up for the impingement symptoms of her right ankle.  She also complains of a painful Haglund's deformity.  She states the previous ankle injection lasted over 5 weeks.  Assessment & Plan: Visit Diagnoses:  1. Impingement syndrome of right ankle     Plan: Patient tolerated the right ankle injection she will follow-up as needed.  Recommended stiffer heel counters in her shoe to unload pressure from the Haglund's deformity.  Follow-Up Instructions: Return if symptoms worsen or fail to improve.   Ortho Exam  Patient is alert, oriented, no adenopathy, well-dressed, normal affect, normal respiratory effort. Examination patient has a Haglund's deformity there is no redness no cellulitis no callus no ulcer.  The Achilles tendon is intact.  Examination ankle she is tender to palpation over the anterior joint line.  Varus and valgus stress is stable anterior drawer is stable she has good dorsiflexion ankle no Achilles contracture.  The heel counter was softening to help her with her shoe wear.  Imaging: No results found. No images are attached to the encounter.  Labs: Lab Results  Component Value Date   HGBA1C 7.8 (H) 06/27/2015   REPTSTATUS 05/27/2017 FINAL 05/27/2017   CULT NO GROWTH 05/27/2017     Lab Results  Component Value Date   ALBUMIN 3.0 (L) 05/28/2017   ALBUMIN 3.2 (L) 05/27/2017   ALBUMIN 4.1 09/25/2012    Lab Results  Component Value Date   MG 2.2 05/31/2017   MG 2.4 05/30/2017   MG 2.1 05/27/2017   No results found for: VD25OH  No results found  for: PREALBUMIN CBC EXTENDED Latest Ref Rng & Units 05/30/2017 05/28/2017 05/27/2017  WBC 4.0 - 10.5 K/uL 15.1(H) 15.0(H) 12.6(H)  RBC 3.87 - 5.11 MIL/uL 3.97 3.73(L) 4.38  HGB 12.0 - 15.0 g/dL 11.3(L) 10.9(L) 13.3  HCT 36.0 - 46.0 % 37.1 33.9(L) 39.1  PLT 150 - 400 K/uL 206 173 271  NEUTROABS 1.7 - 7.7 K/uL 13.7(H) - 8.7(H)  LYMPHSABS 0.7 - 4.0 K/uL 0.9 - 2.9     Body mass index is 27.34 kg/m.  Orders:  No orders of the defined types were placed in this encounter.  No orders of the defined types were placed in this encounter.    Procedures: Medium Joint Inj: R ankle on 12/22/2019 3:52 PM Indications: pain and diagnostic evaluation Details: 22 G 1.5 in needle, anteromedial approach Medications: 2 mL lidocaine 1 %; 40 mg methylPREDNISolone acetate 40 MG/ML Outcome: tolerated well, no immediate complications Procedure, treatment alternatives, risks and benefits explained, specific risks discussed. Consent was given by the patient. Immediately prior to procedure a time out was called to verify the correct patient, procedure, equipment, support staff and site/side marked as required. Patient was prepped and draped in the usual sterile fashion.      Clinical Data: No additional findings.  ROS:  All other systems negative, except as noted in the HPI. Review  of Systems  Objective: Vital Signs: Ht 5' (1.524 m)   Wt 140 lb (63.5 kg)   BMI 27.34 kg/m   Specialty Comments:  No specialty comments available.  PMFS History: Patient Active Problem List   Diagnosis Date Noted  . Achilles tendinitis, right leg 12/01/2018  . Synovitis of knee 02/05/2018  . 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 Sepsis due to pneumonia ([place]) 05/27/2017  . CKD (chronic kidney disease), stage III 05/27/2017  . Diabetes mellitus type 2, uncontrolled ([place]) 05/27/2017  . Hypothyroid 05/27/2017  . Hypercholesteremia 05/27/2017  . Acute hypokalemia 05/27/2017  . HTN (hypertension)  05/27/2017  . Community acquired pneumonia of left lower lobe of lung   . Diabetes mellitus with complication ([place])   . Type 2 diabetes mellitus ([place]) 06/27/2015  . Hypertension 06/27/2015  . Hypothyroidism 06/27/2015  . Chest pain 06/27/2015  . Precordial chest pain    Past Medical History:  Diagnosis Date  . Arthritis    "all over my body" (05/27/2017)  . Chronic kidney disease (CKD), stage III (moderate)   . Chronic lower back pain   . Frequent UTI   . GERD (gastroesophageal reflux disease)   . Headache    "monthly" (05/27/2017)  . History of blood transfusion    "when I had my neck fusion"  . History of hiatal hernia   . Hypercholesteremia   . Hypertension   . Hypothyroid   . Pneumonia 2000s X 1; 05/27/2017  . Seasonal allergies   . Type II diabetes mellitus (HCC)     Family History  Problem Relation Age of Onset  . Alzheimer's disease Mother   . Heart attack Father   . Heart disease Father   . Dwarfism Sister   . Colon cancer Neg Hx   . Colon polyps Neg Hx   . Esophageal cancer Neg Hx   . Rectal cancer Neg Hx   . Stomach cancer Neg Hx     Past Surgical History:  Procedure Laterality Date  . BACK SURGERY    . BREAST BIOPSY Left   . CATARACT EXTRACTION W/ INTRAOCULAR LENS  IMPLANT, BILATERAL Bilateral   . COLONOSCOPY  10/25/2005   normal  . DILATION AND CURETTAGE OF UTERUS    . POSTERIOR CERVICAL FUSION/FORAMINOTOMY     "fused 4,5,6"  . TONSILLECTOMY     Social History   Occupational History  . Not on file  Tobacco Use  . Smoking status: Former Smoker    Packs/day: 1.00    Years: 6.00    Pack years: 6.00    Types: Cigarettes    Quit date: 10/01/1964    Years since quitting: 55.2  . Smokeless tobacco: Never Used  Substance and Sexual Activity  . Alcohol use: No  . Drug use: No  . Sexual activity: Never

## 2020-02-22 ENCOUNTER — Other Ambulatory Visit: Payer: Self-pay

## 2020-02-22 ENCOUNTER — Ambulatory Visit (INDEPENDENT_AMBULATORY_CARE_PROVIDER_SITE_OTHER): Payer: Medicare Other | Admitting: Orthopedic Surgery

## 2020-02-22 ENCOUNTER — Encounter: Payer: Self-pay | Admitting: Orthopedic Surgery

## 2020-02-22 DIAGNOSIS — M25871 Other specified joint disorders, right ankle and foot: Secondary | ICD-10-CM | POA: Diagnosis not present

## 2020-02-22 NOTE — Progress Notes (Signed)
Office Visit Note   Patient: Amber Gray           Date of Birth: 18-Jan-1941           MRN: [id number] Visit Date: 02/22/2020              Requested by: [name], [address] [place],  [place] [address] PCP: [name], MD  Chief Complaint  Patient presents with  . Right Foot - Pain      HPI: Patient is a 79 year old woman who presents in follow-up for impingement symptoms right ankle.  She states that her pain is getting worse she is currently uses a walker she requires regarding a fracture boot.  She states that the injections have provided her temporary relief at maximum 5 weeks.  Assessment & Plan: Visit Diagnoses:  1. Impingement syndrome of right ankle     Plan: Due to persistent pain and only temporary relief with a intra-articular injection of the right ankle patient states she would like to proceed with arthroscopic intervention.  Risks and benefits were discussed including infection and persistent pain patient states she understands wished to proceed at this time we will plan for outpatient surgery at [facility] day surgery.  Patient most likely will need a fracture boot at discharge.  Follow-Up Instructions: Return in about 2 weeks (around 03/07/2020).   Ortho Exam  Patient is alert, oriented, no adenopathy, well-dressed, normal affect, normal respiratory effort. Examination patient has good pulses she has good ankle and subtalar range of motion she is point tender to palpation anteriorly over the ankle peroneal and posterior tibial tendons are nontender to palpation.  Imaging: No results found. No images are attached to the encounter.  Labs: Lab Results  Component Value Date   HGBA1C 7.8 (H) 06/27/2015   REPTSTATUS 05/27/2017 FINAL 05/27/2017   CULT NO GROWTH 05/27/2017     Lab Results  Component Value Date   ALBUMIN 3.0 (L) 05/28/2017   ALBUMIN 3.2 (L) 05/27/2017   ALBUMIN 4.1 09/25/2012    Lab Results  Component Value Date   MG 2.2  05/31/2017   MG 2.4 05/30/2017   MG 2.1 05/27/2017   No results found for: VD25OH  No results found for: PREALBUMIN CBC EXTENDED Latest Ref Rng & Units 05/30/2017 05/28/2017 05/27/2017  WBC 4.0 - 10.5 K/uL 15.1(H) 15.0(H) 12.6(H)  RBC 3.87 - 5.11 MIL/uL 3.97 3.73(L) 4.38  HGB 12.0 - 15.0 g/dL 11.3(L) 10.9(L) 13.3  HCT 36.0 - 46.0 % 37.1 33.9(L) 39.1  PLT 150 - 400 K/uL 206 173 271  NEUTROABS 1.7 - 7.7 K/uL 13.7(H) - 8.7(H)  LYMPHSABS 0.7 - 4.0 K/uL 0.9 - 2.9     There is no height or weight on file to calculate BMI.  Orders:  No orders of the defined types were placed in this encounter.  No orders of the defined types were placed in this encounter.    Procedures: No procedures performed  Clinical Data: No additional findings.  ROS:  All other systems negative, except as noted in the HPI. Review of Systems  Objective: Vital Signs: There were no vitals taken for this visit.  Specialty Comments:  No specialty comments available.  PMFS History: Patient Active Problem List   Diagnosis Date Noted  . Achilles tendinitis, right leg 12/01/2018  . Synovitis of knee 02/05/2018  . 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 Sepsis due to pneumonia ([place]) 05/27/2017  . CKD (chronic kidney disease), stage III 05/27/2017  .  Diabetes mellitus type 2, uncontrolled ([place]) 05/27/2017  . Hypothyroid 05/27/2017  . Hypercholesteremia 05/27/2017  . Acute hypokalemia 05/27/2017  . HTN (hypertension) 05/27/2017  . Community acquired pneumonia of left lower lobe of lung   . Diabetes mellitus with complication ([place])   . Type 2 diabetes mellitus ([place]) 06/27/2015  . Hypertension 06/27/2015  . Hypothyroidism 06/27/2015  . Chest pain 06/27/2015  . Precordial chest pain    Past Medical History:  Diagnosis Date  . Arthritis    "all over my body" (05/27/2017)  . Chronic kidney disease (CKD), stage III (moderate)   . Chronic lower back pain   . Frequent UTI   . GERD  (gastroesophageal reflux disease)   . Headache    "monthly" (05/27/2017)  . History of blood transfusion    "when I had my neck fusion"  . History of hiatal hernia   . Hypercholesteremia   . Hypertension   . Hypothyroid   . Pneumonia 2000s X 1; 05/27/2017  . Seasonal allergies   . Type II diabetes mellitus (HCC)     Family History  Problem Relation Age of Onset  . Alzheimer's disease Mother   . Heart attack Father   . Heart disease Father   . Dwarfism Sister   . Colon cancer Neg Hx   . Colon polyps Neg Hx   . Esophageal cancer Neg Hx   . Rectal cancer Neg Hx   . Stomach cancer Neg Hx     Past Surgical History:  Procedure Laterality Date  . BACK SURGERY    . BREAST BIOPSY Left   . CATARACT EXTRACTION W/ INTRAOCULAR LENS  IMPLANT, BILATERAL Bilateral   . COLONOSCOPY  10/25/2005   normal  . DILATION AND CURETTAGE OF UTERUS    . POSTERIOR CERVICAL FUSION/FORAMINOTOMY     "fused 4,5,6"  . TONSILLECTOMY     Social History   Occupational History  . Not on file  Tobacco Use  . Smoking status: Former Smoker    Packs/day: 1.00    Years: 6.00    Pack years: 6.00    Types: Cigarettes    Quit date: 10/01/1964    Years since quitting: 55.4  . Smokeless tobacco: Never Used  Substance and Sexual Activity  . Alcohol use: No  . Drug use: No  . Sexual activity: Never

## 2020-03-17 ENCOUNTER — Other Ambulatory Visit: Payer: Self-pay

## 2020-03-22 ENCOUNTER — Other Ambulatory Visit: Payer: Self-pay

## 2020-03-22 ENCOUNTER — Encounter (HOSPITAL_BASED_OUTPATIENT_CLINIC_OR_DEPARTMENT_OTHER): Payer: Self-pay | Admitting: Orthopedic Surgery

## 2020-03-25 ENCOUNTER — Other Ambulatory Visit (HOSPITAL_COMMUNITY)
Admission: RE | Admit: 2020-03-25 | Discharge: 2020-03-25 | Disposition: A | Discharge status: Home / Self Care | Payer: Medicare Other | Source: Ambulatory Visit | Attending: Orthopedic Surgery | Admitting: Orthopedic Surgery

## 2020-03-25 ENCOUNTER — Other Ambulatory Visit: Payer: Self-pay | Admitting: Physician Assistant

## 2020-03-25 ENCOUNTER — Encounter (HOSPITAL_BASED_OUTPATIENT_CLINIC_OR_DEPARTMENT_OTHER)
Admission: RE | Admit: 2020-03-25 | Discharge: 2020-03-25 | Disposition: A | Discharge status: Home / Self Care | Payer: Medicare Other | Source: Ambulatory Visit | Attending: Orthopedic Surgery | Admitting: Orthopedic Surgery

## 2020-03-25 DIAGNOSIS — Z01812 Encounter for preprocedural laboratory examination: Secondary | ICD-10-CM | POA: Insufficient documentation

## 2020-03-25 DIAGNOSIS — I10 Essential (primary) hypertension: Secondary | ICD-10-CM | POA: Diagnosis not present

## 2020-03-25 DIAGNOSIS — Z20822 Contact with and (suspected) exposure to covid-19: Secondary | ICD-10-CM | POA: Diagnosis not present

## 2020-03-25 DIAGNOSIS — Z87891 Personal history of nicotine dependence: Secondary | ICD-10-CM | POA: Diagnosis not present

## 2020-03-25 DIAGNOSIS — M25871 Other specified joint disorders, right ankle and foot: Secondary | ICD-10-CM | POA: Diagnosis not present

## 2020-03-25 DIAGNOSIS — E119 Type 2 diabetes mellitus without complications: Secondary | ICD-10-CM | POA: Diagnosis not present

## 2020-03-25 LAB — BASIC METABOLIC PANEL
Anion gap: 10 (ref 5–15)
BUN: 22 mg/dL (ref 8–23)
CO2: 24 mmol/L (ref 22–32)
Calcium: 9.1 mg/dL (ref 8.9–10.3)
Chloride: 104 mmol/L (ref 98–111)
Creatinine, Ser: 1.27 mg/dL — ABNORMAL HIGH (ref 0.44–1.00)
GFR calc Af Amer: 46 mL/min — ABNORMAL LOW (ref >60)
GFR calc non Af Amer: 40 mL/min — ABNORMAL LOW (ref >60)
Glucose, Bld: 158 mg/dL — ABNORMAL HIGH (ref 70–99)
Potassium: 4 mmol/L (ref 3.5–5.1)
Sodium: 138 mmol/L (ref 135–145)

## 2020-03-25 LAB — SARS CORONAVIRUS 2 (TAT 6-24 HRS): SARS Coronavirus 2: NEGATIVE

## 2020-03-25 NOTE — Progress Notes (Signed)

## 2020-03-29 ENCOUNTER — Encounter (HOSPITAL_BASED_OUTPATIENT_CLINIC_OR_DEPARTMENT_OTHER)
Admission: RE | Disposition: A | Discharge status: Home / Self Care | Payer: Self-pay | Source: Home / Self Care | Attending: Orthopedic Surgery

## 2020-03-29 ENCOUNTER — Ambulatory Visit (HOSPITAL_BASED_OUTPATIENT_CLINIC_OR_DEPARTMENT_OTHER): Payer: Medicare Other | Admitting: Certified Registered"

## 2020-03-29 ENCOUNTER — Other Ambulatory Visit: Payer: Self-pay

## 2020-03-29 ENCOUNTER — Ambulatory Visit (HOSPITAL_BASED_OUTPATIENT_CLINIC_OR_DEPARTMENT_OTHER)
Admission: RE | Admit: 2020-03-29 | Discharge: 2020-03-29 | Disposition: A | Discharge status: Home / Self Care | Payer: Medicare Other | Attending: Orthopedic Surgery | Admitting: Orthopedic Surgery

## 2020-03-29 ENCOUNTER — Encounter (HOSPITAL_BASED_OUTPATIENT_CLINIC_OR_DEPARTMENT_OTHER): Payer: Self-pay | Admitting: Orthopedic Surgery

## 2020-03-29 DIAGNOSIS — Z87891 Personal history of nicotine dependence: Secondary | ICD-10-CM | POA: Insufficient documentation

## 2020-03-29 DIAGNOSIS — E119 Type 2 diabetes mellitus without complications: Secondary | ICD-10-CM | POA: Diagnosis not present

## 2020-03-29 DIAGNOSIS — I10 Essential (primary) hypertension: Secondary | ICD-10-CM | POA: Diagnosis not present

## 2020-03-29 DIAGNOSIS — M25871 Other specified joint disorders, right ankle and foot: Secondary | ICD-10-CM | POA: Diagnosis not present

## 2020-03-29 HISTORY — DX: Dyspnea, unspecified: R06.00

## 2020-03-29 HISTORY — DX: Depression, unspecified: F32.A

## 2020-03-29 HISTORY — PX: ANKLE ARTHROSCOPY: SHX545

## 2020-03-29 HISTORY — DX: Anxiety disorder, unspecified: F41.9

## 2020-03-29 LAB — GLUCOSE, CAPILLARY
Glucose-Capillary: 180 mg/dL — ABNORMAL HIGH (ref 70–99)
Glucose-Capillary: 155 mg/dL — ABNORMAL HIGH (ref 70–99)

## 2020-03-29 SURGERY — ARTHROSCOPY, ANKLE
Anesthesia: General | Site: Ankle | Laterality: Right

## 2020-03-29 MED ORDER — OXYCODONE HCL 5 MG/5ML PO SOLN: 5.0000 mg | Freq: Once | ORAL | Status: DC | PRN

## 2020-03-29 MED ORDER — FENTANYL CITRATE (PF) 100 MCG/2ML IJ SOLN
INTRAMUSCULAR | Status: AC
  Filled 2020-03-29: qty 2

## 2020-03-29 MED ORDER — DEXAMETHASONE SODIUM PHOSPHATE 10 MG/ML IJ SOLN
INTRAMUSCULAR | Status: DC | PRN
  Administered 2020-03-29: 4 mg via INTRAVENOUS

## 2020-03-29 MED ORDER — MIDAZOLAM HCL 2 MG/2ML IJ SOLN
2.0000 mg | Freq: Once | INTRAMUSCULAR | Status: AC
  Administered 2020-03-29: 1 mg via INTRAVENOUS

## 2020-03-29 MED ORDER — HYDROMORPHONE HCL 1 MG/ML IJ SOLN: 0.2500 mg | INTRAMUSCULAR | Status: DC | PRN

## 2020-03-29 MED ORDER — LIDOCAINE HCL (CARDIAC) PF 100 MG/5ML IV SOSY
PREFILLED_SYRINGE | INTRAVENOUS | Status: DC | PRN
  Administered 2020-03-29: 60 mg via INTRAVENOUS

## 2020-03-29 MED ORDER — SODIUM CHLORIDE 0.9 % IR SOLN
Status: DC | PRN
  Administered 2020-03-29: 3000 mL

## 2020-03-29 MED ORDER — ROPIVACAINE HCL 5 MG/ML IJ SOLN
INTRAMUSCULAR | Status: DC | PRN
Start: 2020-03-29 — End: 2020-03-29
  Administered 2020-03-29: 30 mL via PERINEURAL

## 2020-03-29 MED ORDER — OXYCODONE-ACETAMINOPHEN 5-325 MG PO TABS: 1.0000 | ORAL_TABLET | ORAL | 0 refills | Status: DC | PRN

## 2020-03-29 MED ORDER — LACTATED RINGERS IV SOLN: INTRAVENOUS | Status: DC

## 2020-03-29 MED ORDER — MIDAZOLAM HCL 2 MG/2ML IJ SOLN
INTRAMUSCULAR | Status: AC
  Filled 2020-03-29: qty 2

## 2020-03-29 MED ORDER — OXYCODONE HCL 5 MG PO TABS: 5.0000 mg | ORAL_TABLET | Freq: Once | ORAL | Status: DC | PRN

## 2020-03-29 MED ORDER — ONDANSETRON HCL 4 MG/2ML IJ SOLN
INTRAMUSCULAR | Status: DC | PRN
  Administered 2020-03-29: 4 mg via INTRAVENOUS

## 2020-03-29 MED ORDER — PROPOFOL 10 MG/ML IV BOLUS
INTRAVENOUS | Status: DC | PRN
  Administered 2020-03-29: 150 mg via INTRAVENOUS

## 2020-03-29 MED ORDER — PROMETHAZINE HCL 25 MG/ML IJ SOLN: 6.2500 mg | INTRAMUSCULAR | Status: DC | PRN

## 2020-03-29 MED ORDER — AMISULPRIDE (ANTIEMETIC) 5 MG/2ML IV SOLN: 10.0000 mg | Freq: Once | INTRAVENOUS | Status: DC | PRN

## 2020-03-29 MED ORDER — EPHEDRINE SULFATE 50 MG/ML IJ SOLN
INTRAMUSCULAR | Status: DC | PRN
  Administered 2020-03-29 (×3): 10 mg via INTRAVENOUS

## 2020-03-29 MED ORDER — FENTANYL CITRATE (PF) 100 MCG/2ML IJ SOLN
50.0000 ug | Freq: Once | INTRAMUSCULAR | Status: AC
  Administered 2020-03-29: 50 ug via INTRAVENOUS

## 2020-03-29 MED ORDER — CEFAZOLIN SODIUM-DEXTROSE 2-4 GM/100ML-% IV SOLN
INTRAVENOUS | Status: AC
  Filled 2020-03-29: qty 100

## 2020-03-29 MED ORDER — CEFAZOLIN SODIUM-DEXTROSE 2-4 GM/100ML-% IV SOLN
2.0000 g | INTRAVENOUS | Status: AC
  Administered 2020-03-29: 2 g via INTRAVENOUS

## 2020-03-29 SURGICAL SUPPLY — 35 items
BLADE EXCALIBUR 4.0X13 (MISCELLANEOUS) ×2 IMPLANT
BNDG COHESIVE 4X5 TAN STRL (GAUZE/BANDAGES/DRESSINGS) IMPLANT
BNDG COHESIVE 6X5 TAN STRL LF (GAUZE/BANDAGES/DRESSINGS) ×2 IMPLANT
BNDG ESMARK 4X9 LF (GAUZE/BANDAGES/DRESSINGS) IMPLANT
COVER WAND RF STERILE (DRAPES) IMPLANT
DISSECTOR 4.0MM X 13CM (MISCELLANEOUS) IMPLANT
DRAPE ARTHROSCOPY W/POUCH 90 (DRAPES) ×2 IMPLANT
DRAPE OEC MINIVIEW 54X84 (DRAPES) IMPLANT
DRAPE U-SHAPE 47X51 STRL (DRAPES) ×2 IMPLANT
DRSG EMULSION OIL 3X3 NADH (GAUZE/BANDAGES/DRESSINGS) ×2 IMPLANT
DURAPREP 26ML APPLICATOR (WOUND CARE) ×2 IMPLANT
EXCALIBUR 3.8MM X 13CM (MISCELLANEOUS) IMPLANT
GAUZE SPONGE 4X4 12PLY STRL (GAUZE/BANDAGES/DRESSINGS) ×2 IMPLANT
GLOVE BIO SURGEON STRL SZ 6.5 (GLOVE) ×4 IMPLANT
GLOVE BIOGEL PI IND STRL 7.0 (GLOVE) ×1 IMPLANT
GLOVE BIOGEL PI IND STRL 9 (GLOVE) ×1 IMPLANT
GLOVE BIOGEL PI INDICATOR 7.0 (GLOVE) ×1
GLOVE BIOGEL PI INDICATOR 9 (GLOVE) ×1
GLOVE SURG ORTHO 9.0 STRL STRW (GLOVE) ×2 IMPLANT
GOWN STRL REUS W/ TWL LRG LVL3 (GOWN DISPOSABLE) ×2 IMPLANT
GOWN STRL REUS W/ TWL XL LVL3 (GOWN DISPOSABLE) ×1 IMPLANT
GOWN STRL REUS W/TWL LRG LVL3 (GOWN DISPOSABLE) ×4
GOWN STRL REUS W/TWL XL LVL3 (GOWN DISPOSABLE) ×2
MANIFOLD NEPTUNE II (INSTRUMENTS) IMPLANT
PACK DSU ARTHROSCOPY (CUSTOM PROCEDURE TRAY) ×2 IMPLANT
PORT APPOLLO RF 90DEGREE MULTI (SURGICAL WAND) IMPLANT
PROBE APOLLO 90XL (SURGICAL WAND) ×2 IMPLANT
SET BASIN DAY SURGERY F.S. (CUSTOM PROCEDURE TRAY) ×2 IMPLANT
SLEEVE SCD COMPRESS KNEE MED (MISCELLANEOUS) ×2 IMPLANT
STRAP ANKLE FOOT DISTRACTOR (ORTHOPEDIC SUPPLIES) IMPLANT
SUT ETHILON 2 0 FSLX (SUTURE) ×2 IMPLANT
SYR 5ML LL (SYRINGE) IMPLANT
TOWEL GREEN STERILE FF (TOWEL DISPOSABLE) ×2 IMPLANT
TUBING ARTHROSCOPY IRRIG 16FT (MISCELLANEOUS) ×2 IMPLANT
WATER STERILE IRR 1000ML POUR (IV SOLUTION) ×2 IMPLANT

## 2020-03-29 NOTE — Discharge Instructions (Signed)

## 2020-03-29 NOTE — H&P (Signed)
Amber Gray is an 79 y.o. female.   Chief Complaint: Right Ankle Impingement HPI: Patient is a 79 year old woman who presents in follow-up for impingement symptoms right ankle.  Amber Gray states that her pain is getting worse Amber Gray is currently uses a walker Amber Gray requires regarding a fracture boot.  Amber Gray states that the injections have provided her temporary relief at maximum 5 weeks.  Past Medical History:  Diagnosis Date  . Anxiety   . Arthritis    "all over my body" (05/27/2017)  . Chronic lower back pain   . Depression   . Dyspnea    allergy related SOB  . Frequent UTI   . GERD (gastroesophageal reflux disease)   . Headache    "monthly" (05/27/2017)  . History of blood transfusion    "when I had my neck fusion"  . History of hiatal hernia   . Hypercholesteremia   . Hypertension   . Hypothyroid   . Pneumonia 2000s X 1; 05/27/2017  . Seasonal allergies   . Type II diabetes mellitus ([place])     Past Surgical History:  Procedure Laterality Date  . BACK SURGERY    . BREAST BIOPSY Left   . CATARACT EXTRACTION W/ INTRAOCULAR LENS  IMPLANT, BILATERAL Bilateral   . COLONOSCOPY  10/25/2005   normal  . DILATION AND CURETTAGE OF UTERUS    . POSTERIOR CERVICAL FUSION/FORAMINOTOMY     "fused 4,5,6"  . TONSILLECTOMY      Family History  Problem Relation Age of Onset  . Alzheimer's disease Mother   . Heart attack Father   . Heart disease Father   . Dwarfism Sister   . Colon cancer Neg Hx   . Colon polyps Neg Hx   . Esophageal cancer Neg Hx   . Rectal cancer Neg Hx   . Stomach cancer Neg Hx    Social History:  reports that Amber Gray quit smoking about 55 years ago. Her smoking use included cigarettes. Amber Gray has a 6.00 pack-year smoking history. Amber Gray has never used smokeless tobacco. Amber Gray reports that Amber Gray does not drink alcohol and does not use drugs.  Allergies:  Allergies  Allergen Reactions  . Metformin And Related Other (See Comments)    No medications prior to admission.    No  results found for this or any previous visit (from the past 48 hour(s)). No results found.  Review of Systems  All other systems reviewed and are negative.   Height 5' (1.524 m), weight 65.8 kg. Physical Exam  Patient is alert, oriented, no adenopathy, well-dressed, normal affect, normal respiratory effort. Examination patient has good pulses Amber Gray has good ankle and subtalar range of motion Amber Gray is point tender to palpation anteriorly over the ankle peroneal and posterior tibial tendons are nontender to palpation. Lungs Clear heart RRR Assessment/Plan 1. Impingement syndrome of right ankle     Plan: Due to persistent pain and only temporary relief with a intra-articular injection of the right ankle patient states Amber Gray would like to proceed with arthroscopic intervention.  Risks and benefits were discussed including infection and persistent pain patient states Amber Gray understands wished to proceed at this time we will plan for outpatient surgery at [facility] day surgery.  Patient most likely will need a fracture boot at discharge.    Bevely Palmer Luccas Towell, PA 03/29/2020, 6:41 AM

## 2020-03-29 NOTE — Anesthesia Procedure Notes (Signed)
Anesthesia Regional Block: Popliteal block   Pre-Anesthetic Checklist: ,, timeout performed, Correct Patient, Correct Site, Correct Laterality, Correct Procedure, Correct Position, site marked, Risks and benefits discussed,  Surgical consent,  Pre-op evaluation,  At surgeon's request and post-op pain management  Laterality: Right  Prep: chloraprep       Needles:  Injection technique: Single-shot  Needle Type: Stimiplex     Needle Length: 9cm  Needle Gauge: 21     Additional Needles:   Procedures:,,,, ultrasound used (permanent image in chart),,,,  Narrative:  Start time: 03/29/2020 10:30 AM End time: 03/29/2020 10:35 AM Injection made incrementally with aspirations every 5 mL.  Performed by: Personally  Anesthesiologist: Lynda Rainwater, MD

## 2020-03-29 NOTE — Transfer of Care (Signed)
Immediate Anesthesia Transfer of Care Note  Patient: SYDNEY HASTEN  Procedure(s) Performed: RIGHT ANKLE ARTHROSCOPY AND DEBRIDEMENT (Right Ankle)  Patient Location: PACU  Anesthesia Type:General  Level of Consciousness: drowsy  Airway & Oxygen Therapy: Patient Spontanous Breathing and Patient connected to nasal cannula oxygen  Post-op Assessment: Report given to RN and Post -op Vital signs reviewed and stable  Post vital signs: Reviewed and stable  Last Vitals:  Vitals Value Taken Time  BP 121/60 03/29/20 1145  Temp 36.9 C 03/29/20 1143  Pulse 72 03/29/20 1146  Resp 13 03/29/20 1146  SpO2 95 % 03/29/20 1146  Vitals shown include unvalidated device data.  Last Pain:  Vitals:   03/29/20 1143  TempSrc:   PainSc: 0-No pain      Patients Stated Pain Goal: 3 (17/98/10 2548)  Complications: No complications documented.

## 2020-03-29 NOTE — Anesthesia Preprocedure Evaluation (Signed)
Anesthesia Evaluation  Patient identified by MRN, date of birth, ID band Patient awake    Reviewed: Allergy & Precautions, NPO status , Patient's Chart, lab work & pertinent test results  Airway Mallampati: II  TM Distance: >3 FB Neck ROM: Full    Dental no notable dental hx.    Pulmonary neg pulmonary ROS, former smoker,    Pulmonary exam normal breath sounds clear to auscultation       Cardiovascular hypertension, Pt. on medications negative cardio ROS Normal cardiovascular exam Rhythm:Regular Rate:Normal     Neuro/Psych  Headaches, Anxiety Depression negative psych ROS   GI/Hepatic Neg liver ROS, GERD  ,  Endo/Other  diabetesHypothyroidism   Renal/GU Renal InsufficiencyRenal disease  negative genitourinary   Musculoskeletal  (+) Arthritis , Osteoarthritis,    Abdominal   Peds negative pediatric ROS (+)  Hematology negative hematology ROS (+)   Anesthesia Other Findings   Reproductive/Obstetrics negative OB ROS                             Anesthesia Physical Anesthesia Plan  ASA: III  Anesthesia Plan: General   Post-op Pain Management:  Regional for Post-op pain   Induction: Intravenous  PONV Risk Score and Plan: 3 and Ondansetron, Dexamethasone, Midazolam and Treatment may vary due to age or medical condition  Airway Management Planned: LMA  Additional Equipment:   Intra-op Plan:   Post-operative Plan: Extubation in OR  Informed Consent: I have reviewed the patients History and Physical, chart, labs and discussed the procedure including the risks, benefits and alternatives for the proposed anesthesia with the patient or authorized representative who has indicated his/her understanding and acceptance.     Dental advisory given  Plan Discussed with: CRNA  Anesthesia Plan Comments:         Anesthesia Quick Evaluation

## 2020-03-29 NOTE — Progress Notes (Signed)
Assisted Dr. Sabra Heck with right, ultrasound guided, popliteal block. Side rails up, monitors on throughout procedure. See vital signs in flow sheet. Tolerated Procedure well.

## 2020-03-29 NOTE — Anesthesia Postprocedure Evaluation (Signed)
Anesthesia Post Note  Patient: Amber Gray  Procedure(s) Performed: RIGHT ANKLE ARTHROSCOPY AND DEBRIDEMENT (Right Ankle)     Patient location during evaluation: PACU Anesthesia Type: General Level of consciousness: awake and alert Pain management: pain level controlled Vital Signs Assessment: post-procedure vital signs reviewed and stable Respiratory status: spontaneous breathing, nonlabored ventilation and respiratory function stable Cardiovascular status: blood pressure returned to baseline and stable Postop Assessment: no apparent nausea or vomiting Anesthetic complications: no   No complications documented.  Last Vitals:  Vitals:   03/29/20 1215 03/29/20 1236  BP: 119/62 123/64  Pulse: 71 72  Resp: 17 16  Temp:  36.6 C  SpO2: 95% 94%    Last Pain:  Vitals:   03/29/20 1236  TempSrc: Oral  PainSc: 0-No pain                 Lynda Rainwater

## 2020-03-29 NOTE — Op Note (Signed)
03/29/2020  11:41 AM  PATIENT:  Gasper Lloyd    PRE-OPERATIVE DIAGNOSIS:  Impingement Right Ankle  POST-OPERATIVE DIAGNOSIS:  Same  PROCEDURE:  RIGHT ANKLE ARTHROSCOPY AND DEBRIDEMENT  SURGEON:  Newt Minion, MD  PHYSICIAN ASSISTANT:None ANESTHESIA:   General  PREOPERATIVE INDICATIONS:  JAELANI POSA is a  79 y.o. female with a diagnosis of Impingement Right Ankle who failed conservative measures and elected for surgical management.    The risks benefits and alternatives were discussed with the patient preoperatively including but not limited to the risks of infection, bleeding, nerve injury, cardiopulmonary complications, the need for revision surgery, among others, and the patient was willing to proceed.  OPERATIVE IMPLANTS: none  @ENCIMAGES @  OPERATIVE FINDINGS: Delamination of the cartilage anteriorly over the distal tibia with osteochondral defect of the talar dome as well as impingement with synovitis within the gutters.  OPERATIVE PROCEDURE: Patient was brought the operating room and underwent a general anesthetic.  After adequate levels anesthesia were obtained patient's right lower extremity was prepped using DuraPrep draped into a sterile field a timeout was called.  The scope was inserted through the anterior medial portal anterior lateral working portal was established.  The skin was incised blunt dissection was carried down to the capsule and a blunt trocar was used to insert into the capsule.  Visualization showed a significant amount of synovitis with impingement in the medial and lateral gutters.  Using the shaver and the electrical wand this was debrided and the wand was used for hemostasis.  Patient did have an osteochondral defect over the anterior lateral talar dome this was debrided back to healthy viable cartilage.  There was delamination of the cartilage over the anterior aspect of the tibia and this was debrided back to a healthy viable margin.  The  medial lateral gutters were clear the tibial talar joint was clear.  The instruments were removed the portals were closed using 2-0 nylon sterile dressing was applied patient was extubated taken the PACU in stable condition.   DISCHARGE PLANNING:  Antibiotic duration: preoperative antibiotics  Weightbearing: Weightbearing as tolerated  Pain medication: Prescription for Percocet  Dressing care/ Wound VAC: Discontinue dressing in 2 days  Ambulatory devices: Crutches  Discharge to: home  Follow-up: In the office 1 week post operative.

## 2020-03-29 NOTE — Anesthesia Procedure Notes (Signed)
Procedure Name: LMA Insertion Date/Time: 03/29/2020 11:06 AM Performed by: Lavonia Dana, CRNA Pre-anesthesia Checklist: Patient identified, Emergency Drugs available, Suction available and Patient being monitored Patient Re-evaluated:Patient Re-evaluated prior to induction Oxygen Delivery Method: Circle system utilized Preoxygenation: Pre-oxygenation with 100% oxygen Induction Type: IV induction Ventilation: Mask ventilation without difficulty LMA: LMA inserted LMA Size: 4.0 Number of attempts: 1 Airway Equipment and Method: Bite block Placement Confirmation: positive ETCO2 Tube secured with: Tape Dental Injury: Teeth and Oropharynx as per pre-operative assessment

## 2020-03-30 ENCOUNTER — Encounter (HOSPITAL_BASED_OUTPATIENT_CLINIC_OR_DEPARTMENT_OTHER): Payer: Self-pay | Admitting: Orthopedic Surgery

## 2020-03-31 ENCOUNTER — Telehealth: Payer: Self-pay | Admitting: Radiology

## 2020-03-31 NOTE — Addendum Note (Signed)
Addendum  created 03/31/20 4068 by Yarden Manuelito, Ernesta Amble, CRNA   Charge Capture section accepted

## 2020-03-31 NOTE — Telephone Encounter (Signed)
Noted  

## 2020-03-31 NOTE — Telephone Encounter (Signed)
Patient left message concerned about continued numbness in foot post ankle scope 03/29/20.  Dr. Sharol Given advised numbing should be wearing off by now. If ACE wrap is still on, numbness could be from tightness with swelling. Advised to patient ACE wrap can be loosened or removed, ice and elevate foot/ankle.  Patient made an appointment with [facility] for tomorrow, if patient starts having feeling in foot she will call and cancel appointment.   [phone]

## 2020-04-01 ENCOUNTER — Inpatient Hospital Stay: Payer: Medicare Other | Admitting: Physician Assistant

## 2020-04-08 ENCOUNTER — Inpatient Hospital Stay: Payer: Medicare Other | Admitting: Physician Assistant

## 2020-04-11 ENCOUNTER — Ambulatory Visit (INDEPENDENT_AMBULATORY_CARE_PROVIDER_SITE_OTHER): Payer: Medicare Other | Admitting: Orthopedic Surgery

## 2020-04-11 ENCOUNTER — Other Ambulatory Visit: Payer: Self-pay

## 2020-04-11 ENCOUNTER — Encounter: Payer: Self-pay | Admitting: Physician Assistant

## 2020-04-11 DIAGNOSIS — M25871 Other specified joint disorders, right ankle and foot: Secondary | ICD-10-CM

## 2020-04-12 ENCOUNTER — Encounter: Payer: Self-pay | Admitting: Orthopedic Surgery

## 2020-04-12 NOTE — Progress Notes (Signed)
Office Visit Note   Patient: Amber Gray           Date of Birth: 02-08-1941           MRN: [id number] Visit Date: 04/11/2020              Requested by: [name], [address],  [place] [address] PCP: [name], MD  Chief Complaint  Patient presents with  . Right Ankle - Pain      HPI: Patient is a 79 year old woman who presents 2 weeks status post right ankle arthroscopy and debridement for impingement.  Patient states occasionally she has some throbbing discomfort on the plantar aspect of the right heel she states she has no pain and feels much better.  Assessment & Plan: Visit Diagnoses:  1. Impingement syndrome of right ankle     Plan: Sutures are harvested she will advance to regular shoewear increase her activities as tolerated.  Follow-Up Instructions: Return in about 3 weeks (around 05/02/2020).   Ortho Exam  Patient is alert, oriented, no adenopathy, well-dressed, normal affect, normal respiratory effort. Examination there is some swelling the incisions are well-healed there is no pain with range of motion no cellulitis no signs of infection.  Imaging: No results found. No images are attached to the encounter.  Labs: Lab Results  Component Value Date   HGBA1C 7.8 (H) 06/27/2015   REPTSTATUS 05/27/2017 FINAL 05/27/2017   CULT NO GROWTH 05/27/2017     Lab Results  Component Value Date   ALBUMIN 3.0 (L) 05/28/2017   ALBUMIN 3.2 (L) 05/27/2017   ALBUMIN 4.1 09/25/2012    Lab Results  Component Value Date   MG 2.2 05/31/2017   MG 2.4 05/30/2017   MG 2.1 05/27/2017   No results found for: VD25OH  No results found for: PREALBUMIN CBC EXTENDED Latest Ref Rng & Units 05/30/2017 05/28/2017 05/27/2017  WBC 4.0 - 10.5 K/uL 15.1(H) 15.0(H) 12.6(H)  RBC 3.87 - 5.11 MIL/uL 3.97 3.73(L) 4.38  HGB 12.0 - 15.0 g/dL 11.3(L) 10.9(L) 13.3  HCT 36 - 46 % 37.1 33.9(L) 39.1  PLT 150 - 400 K/uL 206 173 271  NEUTROABS 1.7 - 7.7 K/uL 13.7(H) -  8.7(H)  LYMPHSABS 0.7 - 4.0 K/uL 0.9 - 2.9     There is no height or weight on file to calculate BMI.  Orders:  No orders of the defined types were placed in this encounter.  No orders of the defined types were placed in this encounter.    Procedures: No procedures performed  Clinical Data: No additional findings.  ROS:  All other systems negative, except as noted in the HPI. Review of Systems  Objective: Vital Signs: There were no vitals taken for this visit.  Specialty Comments:  No specialty comments available.  PMFS History: Patient Active Problem List   Diagnosis Date Noted  . Impingement of right ankle joint   . Achilles tendinitis, right leg 12/01/2018  . Synovitis of knee 02/05/2018  . 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 Sepsis due to pneumonia ([place]) 05/27/2017  . CKD (chronic kidney disease), stage III 05/27/2017  . Diabetes mellitus type 2, uncontrolled ([place]) 05/27/2017  . Hypothyroid 05/27/2017  . Hypercholesteremia 05/27/2017  . Acute hypokalemia 05/27/2017  . HTN (hypertension) 05/27/2017  . Community acquired pneumonia of left lower lobe of lung   . Diabetes mellitus with complication ([place])   . Type 2 diabetes mellitus ([place]) 06/27/2015  . Hypertension 06/27/2015  . Hypothyroidism  06/27/2015  . Chest pain 06/27/2015  . Precordial chest pain    Past Medical History:  Diagnosis Date  . Anxiety   . Arthritis    "all over my body" (05/27/2017)  . Chronic lower back pain   . Depression   . Dyspnea    allergy related SOB  . Frequent UTI   . GERD (gastroesophageal reflux disease)   . Headache    "monthly" (05/27/2017)  . History of blood transfusion    "when I had my neck fusion"  . History of hiatal hernia   . Hypercholesteremia   . Hypertension   . Hypothyroid   . Pneumonia 2000s X 1; 05/27/2017  . Seasonal allergies   . Type II diabetes mellitus ([facility])     Family History  Problem Relation Age of Onset  . Alzheimer's  disease Mother   . Heart attack Father   . Heart disease Father   . Dwarfism Sister   . Colon cancer Neg Hx   . Colon polyps Neg Hx   . Esophageal cancer Neg Hx   . Rectal cancer Neg Hx   . Stomach cancer Neg Hx     Past Surgical History:  Procedure Laterality Date  . ANKLE ARTHROSCOPY Right 03/29/2020   Procedure: RIGHT ANKLE ARTHROSCOPY AND DEBRIDEMENT;  Surgeon: Newt Minion, MD;  Location: [facility];  Service: Orthopedics;  Laterality: Right;  . BACK SURGERY    . BREAST BIOPSY Left   . CATARACT EXTRACTION W/ INTRAOCULAR LENS  IMPLANT, BILATERAL Bilateral   . COLONOSCOPY  10/25/2005   normal  . DILATION AND CURETTAGE OF UTERUS    . PARTIAL HYSTERECTOMY  1968  . POSTERIOR CERVICAL FUSION/FORAMINOTOMY     "fused 4,5,6"  . TONSILLECTOMY     Social History   Occupational History  . Not on file  Tobacco Use  . Smoking status: Former Smoker    Packs/day: 1.00    Years: 6.00    Pack years: 6.00    Types: Cigarettes    Quit date: 10/01/1964    Years since quitting: 55.5  . Smokeless tobacco: Never Used  Vaping Use  . Vaping Use: Never used  Substance and Sexual Activity  . Alcohol use: No  . Drug use: No  . Sexual activity: Not Currently

## 2020-05-02 ENCOUNTER — Encounter: Payer: Self-pay | Admitting: Physician Assistant

## 2020-05-02 ENCOUNTER — Ambulatory Visit (INDEPENDENT_AMBULATORY_CARE_PROVIDER_SITE_OTHER): Payer: Medicare Other | Admitting: Physician Assistant

## 2020-05-02 ENCOUNTER — Other Ambulatory Visit: Payer: Self-pay

## 2020-05-02 DIAGNOSIS — M25871 Other specified joint disorders, right ankle and foot: Secondary | ICD-10-CM

## 2020-05-02 NOTE — Progress Notes (Signed)
Office Visit Note   Patient: Amber Gray           Date of Birth: 1941/04/25           MRN: [id number] Visit Date: 05/02/2020              Requested by: [name], [address],  [place] [address] PCP: [name], MD  Chief Complaint  Patient presents with  . Left Ankle - Pain      HPI: This is a pleasant 79 year old woman who is 4 weeks status post right ankle scope and debridement.  She is using a rolling walker which she always uses for balance.  She denies any pain and is doing much better  Assessment & Plan: Visit Diagnoses: No diagnosis found.  Plan: Patient will make an appointment for a month if she is doing well she may cancel this appointment  Follow-Up Instructions: No follow-ups on file.   Ortho Exam  Patient is alert, oriented, no adenopathy, well-dressed, normal affect, normal respiratory effort. Focused examination demonstrates well-healed surgical portals minimal soft tissue swelling painless excellent ankle range of motion distal CMS is intact no evidence of any cellulitis or infection  Imaging: No results found. No images are attached to the encounter.  Labs: Lab Results  Component Value Date   HGBA1C 7.8 (H) 06/27/2015   REPTSTATUS 05/27/2017 FINAL 05/27/2017   CULT NO GROWTH 05/27/2017     Lab Results  Component Value Date   ALBUMIN 3.0 (L) 05/28/2017   ALBUMIN 3.2 (L) 05/27/2017   ALBUMIN 4.1 09/25/2012    Lab Results  Component Value Date   MG 2.2 05/31/2017   MG 2.4 05/30/2017   MG 2.1 05/27/2017   No results found for: VD25OH  No results found for: PREALBUMIN CBC EXTENDED Latest Ref Rng & Units 05/30/2017 05/28/2017 05/27/2017  WBC 4.0 - 10.5 K/uL 15.1(H) 15.0(H) 12.6(H)  RBC 3.87 - 5.11 MIL/uL 3.97 3.73(L) 4.38  HGB 12.0 - 15.0 g/dL 11.3(L) 10.9(L) 13.3  HCT 36 - 46 % 37.1 33.9(L) 39.1  PLT 150 - 400 K/uL 206 173 271  NEUTROABS 1.7 - 7.7 K/uL 13.7(H) - 8.7(H)  LYMPHSABS 0.7 - 4.0 K/uL 0.9 - 2.9      There is no height or weight on file to calculate BMI.  Orders:  No orders of the defined types were placed in this encounter.  No orders of the defined types were placed in this encounter.    Procedures: No procedures performed  Clinical Data: No additional findings.  ROS:  All other systems negative, except as noted in the HPI. Review of Systems  Objective: Vital Signs: There were no vitals taken for this visit.  Specialty Comments:  No specialty comments available.  PMFS History: Patient Active Problem List   Diagnosis Date Noted  . Impingement of right ankle joint   . Achilles tendinitis, right leg 12/01/2018  . Synovitis of knee 02/05/2018  . 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 Sepsis due to pneumonia ([place]) 05/27/2017  . CKD (chronic kidney disease), stage III 05/27/2017  . Diabetes mellitus type 2, uncontrolled ([place]) 05/27/2017  . Hypothyroid 05/27/2017  . Hypercholesteremia 05/27/2017  . Acute hypokalemia 05/27/2017  . HTN (hypertension) 05/27/2017  . Community acquired pneumonia of left lower lobe of lung   . Diabetes mellitus with complication ([place])   . Type 2 diabetes mellitus ([place]) 06/27/2015  . Hypertension 06/27/2015  . Hypothyroidism 06/27/2015  . Chest pain 06/27/2015  .  Precordial chest pain    Past Medical History:  Diagnosis Date  . Anxiety   . Arthritis    "all over my body" (05/27/2017)  . Chronic lower back pain   . Depression   . Dyspnea    allergy related SOB  . Frequent UTI   . GERD (gastroesophageal reflux disease)   . Headache    "monthly" (05/27/2017)  . History of blood transfusion    "when I had my neck fusion"  . History of hiatal hernia   . Hypercholesteremia   . Hypertension   . Hypothyroid   . Pneumonia 2000s X 1; 05/27/2017  . Seasonal allergies   . Type II diabetes mellitus ([facility])     Family History  Problem Relation Age of Onset  . Alzheimer's disease Mother   . Heart attack Father    . Heart disease Father   . Dwarfism Sister   . Colon cancer Neg Hx   . Colon polyps Neg Hx   . Esophageal cancer Neg Hx   . Rectal cancer Neg Hx   . Stomach cancer Neg Hx     Past Surgical History:  Procedure Laterality Date  . ANKLE ARTHROSCOPY Right 03/29/2020   Procedure: RIGHT ANKLE ARTHROSCOPY AND DEBRIDEMENT;  Surgeon: Newt Minion, MD;  Location: [facility];  Service: Orthopedics;  Laterality: Right;  . BACK SURGERY    . BREAST BIOPSY Left   . CATARACT EXTRACTION W/ INTRAOCULAR LENS  IMPLANT, BILATERAL Bilateral   . COLONOSCOPY  10/25/2005   normal  . DILATION AND CURETTAGE OF UTERUS    . PARTIAL HYSTERECTOMY  1968  . POSTERIOR CERVICAL FUSION/FORAMINOTOMY     "fused 4,5,6"  . TONSILLECTOMY     Social History   Occupational History  . Not on file  Tobacco Use  . Smoking status: Former Smoker    Packs/day: 1.00    Years: 6.00    Pack years: 6.00    Types: Cigarettes    Quit date: 10/01/1964    Years since quitting: 55.6  . Smokeless tobacco: Never Used  Vaping Use  . Vaping Use: Never used  Substance and Sexual Activity  . Alcohol use: No  . Drug use: No  . Sexual activity: Not Currently

## 2020-05-11 ENCOUNTER — Telehealth: Payer: Self-pay | Admitting: Physician Assistant

## 2020-05-11 NOTE — Telephone Encounter (Signed)
Patient called.   She is requesting a call back to discuss being prescribed something for her stomach issues. Says she's been throwing up frequently since surgery.   Call back: [phone]

## 2020-05-11 NOTE — Telephone Encounter (Signed)
Pt is s/p a right ankle scope and debridement on 03/29/20. She should call her PCP to discuss this would not be related to her surgery and it is a long time to be having these s/s to call and notify primary and see what they would like for her to do. Pt voiced understanding and will call with any questions.

## 2020-05-20 ENCOUNTER — Emergency Department (HOSPITAL_COMMUNITY): Payer: Medicare Other

## 2020-05-20 ENCOUNTER — Inpatient Hospital Stay (HOSPITAL_COMMUNITY)
Admission: EM | Admit: 2020-05-20 | Discharge: 2020-05-25 | DRG: 356 | Disposition: A | Discharge status: Skilled Nursing Facility | Payer: Medicare Other | Attending: Internal Medicine | Admitting: Internal Medicine

## 2020-05-20 ENCOUNTER — Other Ambulatory Visit: Payer: Self-pay

## 2020-05-20 ENCOUNTER — Encounter (HOSPITAL_COMMUNITY): Payer: Self-pay | Admitting: Pediatrics

## 2020-05-20 DIAGNOSIS — I2602 Saddle embolus of pulmonary artery with acute cor pulmonale: Secondary | ICD-10-CM

## 2020-05-20 DIAGNOSIS — Z79890 Hormone replacement therapy (postmenopausal): Secondary | ICD-10-CM

## 2020-05-20 DIAGNOSIS — E119 Type 2 diabetes mellitus without complications: Secondary | ICD-10-CM | POA: Diagnosis present

## 2020-05-20 DIAGNOSIS — E039 Hypothyroidism, unspecified: Secondary | ICD-10-CM | POA: Diagnosis present

## 2020-05-20 DIAGNOSIS — G8929 Other chronic pain: Secondary | ICD-10-CM | POA: Diagnosis present

## 2020-05-20 DIAGNOSIS — C801 Malignant (primary) neoplasm, unspecified: Secondary | ICD-10-CM

## 2020-05-20 DIAGNOSIS — K5900 Constipation, unspecified: Secondary | ICD-10-CM | POA: Diagnosis present

## 2020-05-20 DIAGNOSIS — K219 Gastro-esophageal reflux disease without esophagitis: Secondary | ICD-10-CM | POA: Diagnosis present

## 2020-05-20 DIAGNOSIS — I2699 Other pulmonary embolism without acute cor pulmonale: Secondary | ICD-10-CM | POA: Diagnosis not present

## 2020-05-20 DIAGNOSIS — Z8249 Family history of ischemic heart disease and other diseases of the circulatory system: Secondary | ICD-10-CM

## 2020-05-20 DIAGNOSIS — Z9889 Other specified postprocedural states: Secondary | ICD-10-CM

## 2020-05-20 DIAGNOSIS — R519 Headache, unspecified: Secondary | ICD-10-CM | POA: Diagnosis present

## 2020-05-20 DIAGNOSIS — R748 Abnormal levels of other serum enzymes: Secondary | ICD-10-CM | POA: Diagnosis present

## 2020-05-20 DIAGNOSIS — Z9981 Dependence on supplemental oxygen: Secondary | ICD-10-CM

## 2020-05-20 DIAGNOSIS — Z7984 Long term (current) use of oral hypoglycemic drugs: Secondary | ICD-10-CM

## 2020-05-20 DIAGNOSIS — E785 Hyperlipidemia, unspecified: Secondary | ICD-10-CM | POA: Diagnosis present

## 2020-05-20 DIAGNOSIS — J90 Pleural effusion, not elsewhere classified: Secondary | ICD-10-CM

## 2020-05-20 DIAGNOSIS — Z79899 Other long term (current) drug therapy: Secondary | ICD-10-CM

## 2020-05-20 DIAGNOSIS — J9601 Acute respiratory failure with hypoxia: Secondary | ICD-10-CM

## 2020-05-20 DIAGNOSIS — Z86010 Personal history of colonic polyps: Secondary | ICD-10-CM

## 2020-05-20 DIAGNOSIS — Z87440 Personal history of urinary (tract) infections: Secondary | ICD-10-CM

## 2020-05-20 DIAGNOSIS — Z888 Allergy status to other drugs, medicaments and biological substances status: Secondary | ICD-10-CM

## 2020-05-20 DIAGNOSIS — R197 Diarrhea, unspecified: Secondary | ICD-10-CM | POA: Diagnosis present

## 2020-05-20 DIAGNOSIS — R6881 Early satiety: Secondary | ICD-10-CM | POA: Diagnosis present

## 2020-05-20 DIAGNOSIS — F329 Major depressive disorder, single episode, unspecified: Secondary | ICD-10-CM | POA: Diagnosis present

## 2020-05-20 DIAGNOSIS — Z981 Arthrodesis status: Secondary | ICD-10-CM

## 2020-05-20 DIAGNOSIS — N179 Acute kidney failure, unspecified: Secondary | ICD-10-CM

## 2020-05-20 DIAGNOSIS — I10 Essential (primary) hypertension: Secondary | ICD-10-CM | POA: Diagnosis present

## 2020-05-20 DIAGNOSIS — Z6825 Body mass index (BMI) 25.0-25.9, adult: Secondary | ICD-10-CM

## 2020-05-20 DIAGNOSIS — F419 Anxiety disorder, unspecified: Secondary | ICD-10-CM | POA: Diagnosis present

## 2020-05-20 DIAGNOSIS — C800 Disseminated malignant neoplasm, unspecified: Secondary | ICD-10-CM

## 2020-05-20 DIAGNOSIS — C786 Secondary malignant neoplasm of retroperitoneum and peritoneum: Principal | ICD-10-CM

## 2020-05-20 DIAGNOSIS — E872 Acidosis, unspecified: Secondary | ICD-10-CM

## 2020-05-20 DIAGNOSIS — I82442 Acute embolism and thrombosis of left tibial vein: Secondary | ICD-10-CM | POA: Diagnosis present

## 2020-05-20 DIAGNOSIS — R180 Malignant ascites: Secondary | ICD-10-CM | POA: Diagnosis present

## 2020-05-20 DIAGNOSIS — R54 Age-related physical debility: Secondary | ICD-10-CM | POA: Diagnosis present

## 2020-05-20 DIAGNOSIS — R1900 Intra-abdominal and pelvic swelling, mass and lump, unspecified site: Secondary | ICD-10-CM

## 2020-05-20 DIAGNOSIS — Z20822 Contact with and (suspected) exposure to covid-19: Secondary | ICD-10-CM | POA: Diagnosis present

## 2020-05-20 DIAGNOSIS — M1990 Unspecified osteoarthritis, unspecified site: Secondary | ICD-10-CM | POA: Diagnosis present

## 2020-05-20 DIAGNOSIS — M545 Low back pain: Secondary | ICD-10-CM | POA: Diagnosis present

## 2020-05-20 DIAGNOSIS — T502X5A Adverse effect of carbonic-anhydrase inhibitors, benzothiadiazides and other diuretics, initial encounter: Secondary | ICD-10-CM | POA: Diagnosis present

## 2020-05-20 DIAGNOSIS — R634 Abnormal weight loss: Secondary | ICD-10-CM | POA: Diagnosis present

## 2020-05-20 DIAGNOSIS — Z87891 Personal history of nicotine dependence: Secondary | ICD-10-CM

## 2020-05-20 DIAGNOSIS — Z820 Family history of epilepsy and other diseases of the nervous system: Secondary | ICD-10-CM

## 2020-05-20 LAB — BRAIN NATRIURETIC PEPTIDE: B Natriuretic Peptide: 577.4 pg/mL — ABNORMAL HIGH (ref 0.0–100.0)

## 2020-05-20 LAB — SARS CORONAVIRUS 2 BY RT PCR (HOSPITAL ORDER, PERFORMED IN ~~LOC~~ HOSPITAL LAB): SARS Coronavirus 2: NEGATIVE

## 2020-05-20 LAB — COMPREHENSIVE METABOLIC PANEL
ALT: 14 U/L (ref 0–44)
AST: 27 U/L (ref 15–41)
Albumin: 3.1 g/dL — ABNORMAL LOW (ref 3.5–5.0)
Alkaline Phosphatase: 201 U/L — ABNORMAL HIGH (ref 38–126)
Anion gap: 20 — ABNORMAL HIGH (ref 5–15)
BUN: 33 mg/dL — ABNORMAL HIGH (ref 8–23)
CO2: 17 mmol/L — ABNORMAL LOW (ref 22–32)
Calcium: 9.3 mg/dL (ref 8.9–10.3)
Chloride: 101 mmol/L (ref 98–111)
Creatinine, Ser: 1.45 mg/dL — ABNORMAL HIGH (ref 0.44–1.00)
GFR calc Af Amer: 40 mL/min — ABNORMAL LOW (ref >60)
GFR calc non Af Amer: 34 mL/min — ABNORMAL LOW (ref >60)
Glucose, Bld: 187 mg/dL — ABNORMAL HIGH (ref 70–99)
Potassium: 3.7 mmol/L (ref 3.5–5.1)
Sodium: 138 mmol/L (ref 135–145)
Total Bilirubin: 0.8 mg/dL (ref 0.3–1.2)
Total Protein: 8.2 g/dL — ABNORMAL HIGH (ref 6.5–8.1)

## 2020-05-20 LAB — CBC WITH DIFFERENTIAL/PLATELET
Abs Immature Granulocytes: 0.07 10*3/uL (ref 0.00–0.07)
Basophils Absolute: 0.1 10*3/uL (ref 0.0–0.1)
Basophils Relative: 1 %
Eosinophils Absolute: 0 10*3/uL (ref 0.0–0.5)
Eosinophils Relative: 0 %
HCT: 38.4 % (ref 36.0–46.0)
Hemoglobin: 11.6 g/dL — ABNORMAL LOW (ref 12.0–15.0)
Immature Granulocytes: 1 %
Lymphocytes Relative: 10 %
Lymphs Abs: 0.9 10*3/uL (ref 0.7–4.0)
MCH: 26.5 pg (ref 26.0–34.0)
MCHC: 30.2 g/dL (ref 30.0–36.0)
MCV: 87.9 fL (ref 80.0–100.0)
Monocytes Absolute: 0.7 10*3/uL (ref 0.1–1.0)
Monocytes Relative: 8 %
Neutro Abs: 7.6 10*3/uL (ref 1.7–7.7)
Neutrophils Relative %: 80 %
Platelets: 249 10*3/uL (ref 150–400)
RBC: 4.37 MIL/uL (ref 3.87–5.11)
RDW: 15.5 % (ref 11.5–15.5)
WBC: 9.4 10*3/uL (ref 4.0–10.5)
nRBC: 0 % (ref 0.0–0.2)

## 2020-05-20 LAB — TROPONIN I (HIGH SENSITIVITY)
Troponin I (High Sensitivity): 61 ng/L — ABNORMAL HIGH (ref <18)
Troponin I (High Sensitivity): 65 ng/L — ABNORMAL HIGH (ref <18)

## 2020-05-20 MED ORDER — ACETAMINOPHEN 325 MG PO TABS
650.0000 mg | ORAL_TABLET | Freq: Once | ORAL | Status: AC
  Administered 2020-05-21: 650 mg via ORAL
  Filled 2020-05-20: qty 2

## 2020-05-20 NOTE — ED Triage Notes (Signed)
C/O shortness of breath, worst today. Initial Sp02 on RA was 84%; placed on 2L via [place] in triage and improved to 92%

## 2020-05-20 NOTE — ED Provider Notes (Signed)
  Physical Exam  BP 129/86   Pulse 79   Temp 98.2 F (36.8 C) (Oral)   Resp (!) 21   SpO2 97%   Physical Exam  Gen: appears nontoxic Pulm: Speaking in 5-6 word sentences.  Sats 95% plus on 3 L.  Mildly tachypneic. Lower ext: Bilateral swelling, left leg mildly warm when compared to the right. Abd: Abdomen firm on the left side, soft on the right.  ED Course/Procedures     .Critical Care Performed by: Franchot Heidelberg, PA-C Authorized by: Franchot Heidelberg, PA-C   Critical care provider statement:    Critical care time (minutes):  35   Critical care time was exclusive of:  Separately billable procedures and treating other patients and teaching time   Critical care was necessary to treat or prevent imminent or life-threatening deterioration of the following conditions:  Respiratory failure   Critical care was time spent personally by me on the following activities:  Blood draw for specimens, development of treatment plan with patient or surrogate, evaluation of patient's response to treatment, examination of patient, obtaining history from patient or surrogate, ordering and performing treatments and interventions, ordering and review of laboratory studies, ordering and review of radiographic studies, pulse oximetry, re-evaluation of patient's condition, discussions with consultants and review of old charts   I assumed direction of critical care for this patient from another provider in my specialty: no   Comments:     Patient with saddle PE causing right heart strain.  Requiring heparin drip, critical care consult, and admission to the hospital.    MDM   Pt signed out to me by C Weekly, MD. Please see previous notes for further history.   In brief, patient presenting for evaluation of worsening shortness of breath and bilateral lower extremity edema since ankle surgery.  On exam, patient was found to have a distended abdomen with mass/firmness of the left side.  She was hypoxic  with EMS, placed on oxygen, is not normally on O2.  Labs show mildly elevated troponin in the 60s, stable.  Mild acidosis with a bicarb of 17 and a gap of 20, likely due to increased work of breathing.  Chest x-ray shows a small pleural effusion.  She is pending CT of the chest abdomen pelvis for further evaluation.  She will need to be admitted after imaging returns.  CTA of the chest shows saddle PE with right heart strain, ratio of 1.6.  Patient's Pasi score is 149, she is classified, very high risk.  Will consult with critical care.  Patient CT of the abdomen pelvis shows a large mass, likely GYN in nature.  I discussed findings with patient.  Discussed need for hospitalization.  I called patient's daughter, but was unable to make contact.    Discussed with Dr. Oletta Darter from critical care, who recommends heparin and admission to hospitalist.  Critical care team will consult.  Discussed with Dr. Marlowe Sax from [facility] hospitalist service, pt to be admitted.     Franchot Heidelberg, PA-C 05/21/20 0244    Orpah Greek, MD 05/21/20 [phone]

## 2020-05-20 NOTE — ED Provider Notes (Signed)
[facility] EMERGENCY DEPARTMENT Provider Note   CSN: [id number] Arrival date & time: 05/20/20  1416     History Chief Complaint  Patient presents with  . Shortness of Breath    Amber Gray is a 79 y.o. female.  The history is provided by the patient.  Shortness of Breath Severity:  Mild Onset quality:  Gradual Duration:  6 weeks Timing:  Constant Progression:  Worsening Chronicity:  Recurrent Context: activity   Relieved by:  Rest Worsened by:  Activity Ineffective treatments:  None tried Associated symptoms: cough   Associated symptoms: no abdominal pain, no chest pain, no ear pain, no fever, no rash, no sore throat and no vomiting   Risk factors: recent surgery   Risk factors: no hx of PE/DVT        Past Medical History:  Diagnosis Date  . Anxiety   . Arthritis    "all over my body" (05/27/2017)  . Chronic lower back pain   . Depression   . Dyspnea    allergy related SOB  . Frequent UTI   . GERD (gastroesophageal reflux disease)   . Headache    "monthly" (05/27/2017)  . History of blood transfusion    "when I had my neck fusion"  . History of hiatal hernia   . Hypercholesteremia   . Hypertension   . Hypothyroid   . Pneumonia 2000s X 1; 05/27/2017  . Seasonal allergies   . Type II diabetes mellitus [facility])     Patient Active Problem List   Diagnosis Date Noted  . Acute pulmonary embolism ([place]) 05/21/2020  . Acute respiratory failure with hypoxia ([place]) 05/21/2020  . Pleural effusion 05/21/2020  . AKI (acute kidney injury) ([place]) 05/21/2020  . Lactic acidosis 05/21/2020  . Impingement of right ankle joint   . Achilles tendinitis, right leg 12/01/2018  . Synovitis of knee 02/05/2018  . 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 Sepsis due to pneumonia ([place]) 05/27/2017  . CKD (chronic kidney disease), stage III 05/27/2017  . Diabetes mellitus type 2, uncontrolled ([place]) 05/27/2017  . Hypothyroid 05/27/2017  .  Hypercholesteremia 05/27/2017  . Acute hypokalemia 05/27/2017  . HTN (hypertension) 05/27/2017  . Community acquired pneumonia of left lower lobe of lung   . Diabetes mellitus with complication ([place])   . Type 2 diabetes mellitus ([facility]) 06/27/2015  . Hypertension 06/27/2015  . Hypothyroidism 06/27/2015  . Chest pain 06/27/2015  . Precordial chest pain     Past Surgical History:  Procedure Laterality Date  . ANKLE ARTHROSCOPY Right 03/29/2020   Procedure: RIGHT ANKLE ARTHROSCOPY AND DEBRIDEMENT;  Surgeon: Newt Minion, MD;  Location: [facility];  Service: Orthopedics;  Laterality: Right;  . BACK SURGERY    . BREAST BIOPSY Left   . CATARACT EXTRACTION W/ INTRAOCULAR LENS  IMPLANT, BILATERAL Bilateral   . COLONOSCOPY  10/25/2005   normal  . DILATION AND CURETTAGE OF UTERUS    . PARTIAL HYSTERECTOMY  1968  . POSTERIOR CERVICAL FUSION/FORAMINOTOMY     "fused 4,5,6"  . TONSILLECTOMY       OB History   No obstetric history on file.     Family History  Problem Relation Age of Onset  . Alzheimer's disease Mother   . Heart attack Father   . Heart disease Father   . Dwarfism Sister   . Colon cancer Neg Hx   . Colon polyps Neg Hx   . Esophageal cancer Neg Hx   .  Rectal cancer Neg Hx   . Stomach cancer Neg Hx     Social History   Tobacco Use  . Smoking status: Former Smoker    Packs/day: 1.00    Years: 6.00    Pack years: 6.00    Types: Cigarettes    Quit date: 10/01/1964    Years since quitting: 55.6  . Smokeless tobacco: Never Used  Vaping Use  . Vaping Use: Never used  Substance Use Topics  . Alcohol use: No  . Drug use: No    Home Medications Prior to Admission medications   Medication Sig Start Date End Date Taking? Authorizing Provider  acetaminophen (TYLENOL) 325 MG tablet Take 650 mg by mouth every 6 (six) hours as needed for mild pain.   Yes [provider]  albuterol (PROVENTIL HFA;VENTOLIN HFA) 108 (90 BASE) MCG/ACT inhaler Inhale  2 puffs into the lungs every 6 (six) hours as needed. For wheezing.   Yes [provider]  ALPRAZolam (XANAX) 0.25 MG tablet Take 0.25 mg by mouth daily as needed for anxiety.  06/17/18  Yes [provider]  cetirizine (ZYRTEC) 10 MG tablet Take 10 mg by mouth daily.   Yes [provider]  empagliflozin (JARDIANCE) 10 MG TABS tablet Take 10 mg by mouth daily.   Yes [provider]  furosemide (LASIX) 40 MG tablet Take 40 mg by mouth daily.  04/04/17  Yes [provider]  levothyroxine (SYNTHROID, LEVOTHROID) 75 MCG tablet Take 75 mcg by mouth daily.   Yes [provider]  losartan (COZAAR) 50 MG tablet Take 50 mg by mouth daily.   Yes [provider]  omeprazole (PRILOSEC) 20 MG capsule Take 20 mg by mouth daily. 04/04/17  Yes [provider]  sertraline (ZOLOFT) 50 MG tablet Take 50 mg by mouth daily. 06/17/18  Yes [provider]  simvastatin (ZOCOR) 40 MG tablet Take 40 mg by mouth daily. 05/12/18  Yes [provider]  TRADJENTA 5 MG TABS tablet Take 5 mg by mouth daily. 08/15/18  Yes [provider]  glimepiride (AMARYL) 1 MG tablet Take 0.5 mg by mouth daily. 05/09/17   [provider]  oxyCODONE-acetaminophen (PERCOCET) 5-325 MG tablet Take 1 tablet by mouth every 4 (four) hours as needed for severe pain. Patient not taking: Reported on 05/20/2020 03/29/20 03/29/21  Persons, Bevely Palmer, [place]    Allergies    Metformin and related  Review of Systems   Review of Systems  Constitutional: Negative for chills and fever.  HENT: Negative for ear pain and sore throat.   Eyes: Negative for pain and visual disturbance.  Respiratory: Positive for cough and shortness of breath.   Cardiovascular: Negative for chest pain and palpitations.  Gastrointestinal: Positive for constipation. Negative for abdominal pain and vomiting.  Genitourinary: Negative for dysuria and hematuria.  Musculoskeletal: Negative for  arthralgias and back pain.       LE swelling  Skin: Negative for color change and rash.  Neurological: Negative for seizures and syncope.  All other systems reviewed and are negative.   Physical Exam Updated Vital Signs BP 118/69 (BP Location: Right Arm)   Pulse 67   Temp (!) 97.5 F (36.4 C) (Oral)   Resp 16   Ht 5' (1.524 m)   Wt 58.8 kg   SpO2 97%   BMI 25.32 kg/m   Physical Exam Vitals and nursing note reviewed.  Constitutional:      General: She is not in acute distress.  Appearance: She is well-developed.  HENT:     Head: Normocephalic and atraumatic.  Eyes:     Conjunctiva/sclera: Conjunctivae normal.  Cardiovascular:     Rate and Rhythm: Normal rate and regular rhythm.     Heart sounds: No murmur heard.   Pulmonary:     Effort: Pulmonary effort is normal. No respiratory distress.     Breath sounds: Rhonchi (LLL) present. No decreased breath sounds, wheezing or rales.  Chest:     Chest wall: No mass, deformity or tenderness.  Abdominal:     Palpations: Abdomen is soft.     Tenderness: There is no abdominal tenderness.     Comments: Abdominal distension w/ hard mass palpated in L hemiabdomen.  No rebound or guarding and no significant abdominal tenderness.    Musculoskeletal:     Cervical back: Neck supple.  Skin:    General: Skin is warm and dry.  Neurological:     General: No focal deficit present.     Mental Status: She is alert and oriented to person, place, and time.  Psychiatric:        Mood and Affect: Mood normal.        Behavior: Behavior normal.     ED Results / Procedures / Treatments   Labs (all labs ordered are listed, but only abnormal results are displayed) Labs Reviewed  COMPREHENSIVE METABOLIC PANEL - Abnormal; Notable for the following components:      Result Value   CO2 17 (*)    Glucose, Bld 187 (*)    BUN 33 (*)    Creatinine, Ser 1.45 (*)    Total Protein 8.2 (*)    Albumin 3.1 (*)    Alkaline Phosphatase 201 (*)    GFR  calc non Af Amer 34 (*)    GFR calc Af Amer 40 (*)    Anion gap 20 (*)    All other components within normal limits  CBC WITH DIFFERENTIAL/PLATELET - Abnormal; Notable for the following components:   Hemoglobin 11.6 (*)    All other components within normal limits  BRAIN NATRIURETIC PEPTIDE - Abnormal; Notable for the following components:   B Natriuretic Peptide 577.4 (*)    All other components within normal limits  LACTIC ACID, PLASMA - Abnormal; Notable for the following components:   Lactic Acid, Venous 2.1 (*)    All other components within normal limits  LACTIC ACID, PLASMA - Abnormal; Notable for the following components:   Lactic Acid, Venous 2.0 (*)    All other components within normal limits  HEMOGLOBIN A1C - Abnormal; Notable for the following components:   Hgb A1c MFr Bld 7.1 (*)    All other components within normal limits  GAMMA GT - Abnormal; Notable for the following components:   GGT 59 (*)    All other components within normal limits  BRAIN NATRIURETIC PEPTIDE - Abnormal; Notable for the following components:   B Natriuretic Peptide 484.4 (*)    All other components within normal limits  LACTATE DEHYDROGENASE - Abnormal; Notable for the following components:   LDH 698 (*)    All other components within normal limits  GLUCOSE, CAPILLARY - Abnormal; Notable for the following components:   Glucose-Capillary 108 (*)    All other components within normal limits  CBG MONITORING, ED - Abnormal; Notable for the following components:   Glucose-Capillary 138 (*)    All other components within normal limits  TROPONIN I (HIGH SENSITIVITY) - Abnormal; Notable for the  following components:   Troponin I (High Sensitivity) 61 (*)    All other components within normal limits  TROPONIN I (HIGH SENSITIVITY) - Abnormal; Notable for the following components:   Troponin I (High Sensitivity) 65 (*)    All other components within normal limits  TROPONIN I (HIGH SENSITIVITY) -  Abnormal; Notable for the following components:   Troponin I (High Sensitivity) 73 (*)    All other components within normal limits  SARS CORONAVIRUS 2 BY RT PCR (HOSPITAL ORDER, [facility])  HEPARIN LEVEL (UNFRACTIONATED)  BRAIN NATRIURETIC PEPTIDE  BRAIN NATRIURETIC PEPTIDE  TROPONIN I (HIGH SENSITIVITY)    EKG EKG Interpretation  Date/Time:  Friday May 20 2020 14:43:17 EDT Ventricular Rate:  89 PR Interval:  146 QRS Duration: 72 QT Interval:  350 QTC Calculation: 425 R Axis:   -25 Text Interpretation: Normal sinus rhythm Low voltage QRS Inferior infarct , age undetermined Anterolateral infarct , age undetermined No significant change since last tracing Confirmed by Blanchie Dessert [phone]) on 05/20/2020 8:22:53 PM   Radiology DG Chest 2 View  Result Date: 05/20/2020 CLINICAL DATA:  79 year old female with shortness of breath. EXAM: CHEST - 2 VIEW COMPARISON:  Chest radiograph dated 12/04/2019. FINDINGS: Small left pleural effusion and left lung base atelectasis or infiltrate. Clinical correlation and follow-up to resolution recommended. The right lung is clear. No pneumothorax. Stable cardiac silhouette. Mild bilateral hilar prominence, likely pulmonary hypertension. There is osteopenia with degenerative changes of the spine. No acute osseous pathology. IMPRESSION: Small left pleural effusion and left lung base atelectasis or infiltrate. Electronically Signed   By: Anner Crete M.D.   On: 05/20/2020 15:39   CT Angio Chest PE W and/or Wo Contrast  Result Date: 05/21/2020 CLINICAL DATA:  Abdominal pain and shortness of breath. Recent surgery. EXAM: CT ANGIOGRAPHY CHEST CT ABDOMEN AND PELVIS WITH CONTRAST TECHNIQUE: Multidetector CT imaging of the chest was performed using the standard protocol during bolus administration of intravenous contrast. Multiplanar CT image reconstructions and MIPs were obtained to evaluate the vascular anatomy. Multidetector  CT imaging of the abdomen and pelvis was performed using the standard protocol during bolus administration of intravenous contrast. CONTRAST:  59mL OMNIPAQUE IOHEXOL 350 MG/ML SOLN COMPARISON:  None. FINDINGS: CTA CHEST FINDINGS Cardiovascular: Contrast injection is sufficient to demonstrate satisfactory opacification of the pulmonary arteries to the segmental level.There is a saddle pulmonary embolus extending into all proximal lobar branches. There is evidence of right heart strain with an RV to LV ratio of 1.6.the visualized aorta is normal. There is a normal 3-vessel arch branching pattern. Heart size is normal, without pericardial effusion. Mediastinum/Nodes: No mediastinal, hilar or axillary lymphadenopathy. The visualized thyroid and thoracic esophageal course are unremarkable. Lungs/Pleura: Small right and large left pleural effusions. Musculoskeletal: No chest wall abnormality. No acute or significant osseous findings. Review of the MIP images confirms the above findings. CT ABDOMEN and PELVIS FINDINGS Hepatobiliary: Normal hepatic contours and density. No visible biliary dilatation. Perihepatic ascites. Normal gallbladder. Pancreas: Normal contours without ductal dilatation. No peripancreatic fluid collection. Spleen: Normal. Adrenals/Urinary Tract: --Adrenal glands: Normal. --Right kidney/ureter: No hydronephrosis or perinephric stranding. No nephrolithiasis. No obstructing ureteral stones. --Left kidney/ureter: No hydronephrosis or perinephric stranding. No nephrolithiasis. No obstructing ureteral stones. --Urinary bladder: Unremarkable. Stomach/Bowel: --Stomach/Duodenum: Intermediate sized hiatal hernia. --Small bowel: No dilatation or inflammation. --Colon: No focal abnormality. --Appendix: Not visualized Vascular/Lymphatic: Normal course and caliber of the major abdominal vessels. No abdominal or pelvic lymphadenopathy. Reproductive: There is a heterogeneous,  multilobulated midline pelvic mass that  measures 12.9 x 8.7 x 9.7 cm. It is unclear whether some component of this is the uterus. The ovaries are not separately visualized. Musculoskeletal. No bony spinal canal stenosis or focal osseous abnormality. Other: There is a large amount of peritoneal nodularity, particularly in the left upper quadrant. A left anterior abdomen circumscribed fluid collection measures 6.2 x 3.9 cm. IMPRESSION: 1. Large saddle pulmonary embolus with CT evidence of right heart strain (RV/LV ratio of 1.6) consistent with at least submassive (intermediate risk) PE. The presence of right heart strain has been associated with an increased risk of morbidity and mortality. 2. Large left and small right pleural effusions. 3. Large amount of peritoneal nodularity, consistent with peritoneal carcinomatosis. 4. Large midline pelvic mass, measuring up to 12.9 x 8.7 x 9.7 cm. 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 Critical Value/emergent results were called by telephone at the time of interpretation on 05/21/2020 at 1:09 am to provider [facility], who verbally acknowledged these results. Electronically Signed   By: Ulyses Jarred M.D.   On: 05/21/2020 01:17   CT ABDOMEN PELVIS W CONTRAST  Result Date: 05/21/2020 CLINICAL DATA:  Abdominal pain and shortness of breath. Recent surgery. EXAM: CT ANGIOGRAPHY CHEST CT ABDOMEN AND PELVIS WITH CONTRAST TECHNIQUE: Multidetector CT imaging of the chest was performed using the standard protocol during bolus administration of intravenous contrast. Multiplanar CT image reconstructions and MIPs were obtained to evaluate the vascular anatomy. Multidetector CT imaging of the abdomen and pelvis was performed using the standard protocol during bolus administration of intravenous contrast. CONTRAST:  25mL OMNIPAQUE IOHEXOL 350 MG/ML SOLN COMPARISON:  None. FINDINGS: CTA CHEST FINDINGS Cardiovascular: Contrast injection is  sufficient to demonstrate satisfactory opacification of the pulmonary arteries to the segmental level.There is a saddle pulmonary embolus extending into all proximal lobar branches. There is evidence of right heart strain with an RV to LV ratio of 1.6.the visualized aorta is normal. There is a normal 3-vessel arch branching pattern. Heart size is normal, without pericardial effusion. Mediastinum/Nodes: No mediastinal, hilar or axillary lymphadenopathy. The visualized thyroid and thoracic esophageal course are unremarkable. Lungs/Pleura: Small right and large left pleural effusions. Musculoskeletal: No chest wall abnormality. No acute or significant osseous findings. Review of the MIP images confirms the above findings. CT ABDOMEN and PELVIS FINDINGS Hepatobiliary: Normal hepatic contours and density. No visible biliary dilatation. Perihepatic ascites. Normal gallbladder. Pancreas: Normal contours without ductal dilatation. No peripancreatic fluid collection. Spleen: Normal. Adrenals/Urinary Tract: --Adrenal glands: Normal. --Right kidney/ureter: No hydronephrosis or perinephric stranding. No nephrolithiasis. No obstructing ureteral stones. --Left kidney/ureter: No hydronephrosis or perinephric stranding. No nephrolithiasis. No obstructing ureteral stones. --Urinary bladder: Unremarkable. Stomach/Bowel: --Stomach/Duodenum: Intermediate sized hiatal hernia. --Small bowel: No dilatation or inflammation. --Colon: No focal abnormality. --Appendix: Not visualized Vascular/Lymphatic: Normal course and caliber of the major abdominal vessels. No abdominal or pelvic lymphadenopathy. Reproductive: There is a heterogeneous, multilobulated midline pelvic mass that measures 12.9 x 8.7 x 9.7 cm. It is unclear whether some component of this is the uterus. The ovaries are not separately visualized. Musculoskeletal. No bony spinal canal stenosis or focal osseous abnormality. Other: There is a large amount of peritoneal nodularity,  particularly in the left upper quadrant. A left anterior abdomen circumscribed fluid collection measures 6.2 x 3.9 cm. IMPRESSION: 1. Large saddle pulmonary embolus with CT evidence of right heart strain (RV/LV ratio of 1.6) consistent with at least  submassive (intermediate risk) PE. The presence of right heart strain has been associated with an increased risk of morbidity and mortality. 2. Large left and small right pleural effusions. 3. Large amount of peritoneal nodularity, consistent with peritoneal carcinomatosis. 4. Large midline pelvic mass, measuring up to 12.9 x 8.7 x 9.7 cm. 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 Critical Value/emergent results were called by telephone at the time of interpretation on 05/21/2020 at 1:09 am to provider [facility], who verbally acknowledged these results. Electronically Signed   By: Ulyses Jarred M.D.   On: 05/21/2020 01:17    Procedures Procedures (including critical care time)  Medications Ordered in ED Medications  heparin ADULT infusion 100 units/mL (25000 units/264mL sodium chloride 0.45%) (1,100 Units/hr Intravenous Handoff 05/21/20 0717)  simvastatin (ZOCOR) tablet 40 mg (has no administration in time range)  sertraline (ZOLOFT) tablet 50 mg (50 mg Oral Given 05/21/20 0819)  levothyroxine (SYNTHROID) tablet 75 mcg (75 mcg Oral Given 05/21/20 0818)  pantoprazole (PROTONIX) EC tablet 20 mg (20 mg Oral Given 05/21/20 0819)  albuterol (PROVENTIL) (2.5 MG/3ML) 0.083% nebulizer solution 2.5 mg (has no administration in time range)  loratadine (CLARITIN) tablet 10 mg (has no administration in time range)  acetaminophen (TYLENOL) tablet 650 mg (has no administration in time range)    Or  acetaminophen (TYLENOL) suppository 650 mg (has no administration in time range)  insulin aspart (novoLOG) injection 0-9 Units (0 Units Subcutaneous Not Given 05/21/20 0818)  0.9 %  sodium  chloride infusion ( Intravenous New Bag/Given 05/21/20 0256)  MEDLINE mouth rinse (15 mLs Mouth Rinse Given 05/21/20 0819)  acetaminophen (TYLENOL) tablet 650 mg (650 mg Oral Given 05/21/20 0007)  iohexol (OMNIPAQUE) 350 MG/ML injection 75 mL (75 mLs Intravenous Contrast Given 05/21/20 0022)  heparin bolus via infusion 4,500 Units (4,500 Units Intravenous Bolus from Bag 05/21/20 0212)    ED Course  I have reviewed the triage vital signs and the nursing notes.  Pertinent labs & imaging results that were available during my care of the patient were reviewed by me and considered in my medical decision making (see chart for details).    MDM Rules/Calculators/A&P                          79 year old female with history of diabetes, hypertension, hypothyroidism, stage III CKD presents with shortness of breath.  Patient states that she had surgery done on her right ankle approximately 2 months ago after which she took 2-3 pain pills that were prescribed to her and states that she has felt shortness of breath since then.  Patient also states that she has been having bilateral lower extremity swelling since the surgery.  Patient states that the shortness of breath is worse when she gets up and starts walking around.  Patient states that she has been having the shortness of breath for approximately 6 weeks however states that has gotten worse over the past couple days which prompted her arrival to the emergency department.  She is also endorsing constipation and intermittent cough but denies fever, chills, chest pain, nausea, vomiting.  Afebrile.  Vital signs stable.  Patient satting above 95% on 2 L oxygen.  Patient is not on any oxygen at home.  Exam is notable for rhonchi in the left lower lobe as well as abdominal distention and what appears to be a hard mass palpated in  the left hemiabdomen.  Unclear etiology of hypoxia or abdominal distension however she does have 2+ pitting edema bilaterally.  We will  obtain basic labs as well as CT of her abdomen and pelvis to further evaluate this mass as well as CT PE to evaluate for PE.  AG 20, Cr 1.45 (1.2 1 mo ago), Trop 60-->65.  BNP 577.  EKG showed NSR.  At time of sign out pt is awaiting CT-PE and CT-A/P on reeval pt appears comfortable on 3L though is still mildly tachypneic.  Pt's daughter updated.  Pt signed out to oncoming APP at 0000 pending imaging and likely admission.  Final Clinical Impression(s) / ED Diagnoses Final diagnoses:  Acute saddle pulmonary embolism with acute cor pulmonale (HCC)  Abdominal mass, unspecified abdominal location    Rx / DC Orders ED Discharge Orders    None       Silvestre Gunner, MD 05/21/20 9810    Blanchie Dessert, MD 05/21/20 2038

## 2020-05-21 ENCOUNTER — Encounter (HOSPITAL_COMMUNITY): Payer: Self-pay | Admitting: Internal Medicine

## 2020-05-21 ENCOUNTER — Inpatient Hospital Stay (HOSPITAL_COMMUNITY): Payer: Medicare Other

## 2020-05-21 ENCOUNTER — Emergency Department (HOSPITAL_COMMUNITY): Payer: Medicare Other

## 2020-05-21 DIAGNOSIS — I2699 Other pulmonary embolism without acute cor pulmonale: Secondary | ICD-10-CM

## 2020-05-21 DIAGNOSIS — R748 Abnormal levels of other serum enzymes: Secondary | ICD-10-CM | POA: Diagnosis present

## 2020-05-21 DIAGNOSIS — G8929 Other chronic pain: Secondary | ICD-10-CM | POA: Diagnosis present

## 2020-05-21 DIAGNOSIS — E119 Type 2 diabetes mellitus without complications: Secondary | ICD-10-CM | POA: Diagnosis present

## 2020-05-21 DIAGNOSIS — M545 Low back pain: Secondary | ICD-10-CM | POA: Diagnosis present

## 2020-05-21 DIAGNOSIS — N179 Acute kidney failure, unspecified: Secondary | ICD-10-CM | POA: Diagnosis present

## 2020-05-21 DIAGNOSIS — I82442 Acute embolism and thrombosis of left tibial vein: Secondary | ICD-10-CM | POA: Diagnosis present

## 2020-05-21 DIAGNOSIS — E872 Acidosis, unspecified: Secondary | ICD-10-CM

## 2020-05-21 DIAGNOSIS — I2602 Saddle embolus of pulmonary artery with acute cor pulmonale: Secondary | ICD-10-CM | POA: Diagnosis present

## 2020-05-21 DIAGNOSIS — Z87440 Personal history of urinary (tract) infections: Secondary | ICD-10-CM | POA: Diagnosis not present

## 2020-05-21 DIAGNOSIS — J90 Pleural effusion, not elsewhere classified: Secondary | ICD-10-CM | POA: Diagnosis present

## 2020-05-21 DIAGNOSIS — R197 Diarrhea, unspecified: Secondary | ICD-10-CM | POA: Diagnosis present

## 2020-05-21 DIAGNOSIS — M1990 Unspecified osteoarthritis, unspecified site: Secondary | ICD-10-CM | POA: Diagnosis present

## 2020-05-21 DIAGNOSIS — R519 Headache, unspecified: Secondary | ICD-10-CM | POA: Diagnosis present

## 2020-05-21 DIAGNOSIS — C786 Secondary malignant neoplasm of retroperitoneum and peritoneum: Secondary | ICD-10-CM | POA: Diagnosis present

## 2020-05-21 DIAGNOSIS — F419 Anxiety disorder, unspecified: Secondary | ICD-10-CM | POA: Diagnosis present

## 2020-05-21 DIAGNOSIS — K219 Gastro-esophageal reflux disease without esophagitis: Secondary | ICD-10-CM | POA: Diagnosis present

## 2020-05-21 DIAGNOSIS — R634 Abnormal weight loss: Secondary | ICD-10-CM | POA: Diagnosis present

## 2020-05-21 DIAGNOSIS — I2690 Septic pulmonary embolism without acute cor pulmonale: Secondary | ICD-10-CM | POA: Diagnosis not present

## 2020-05-21 DIAGNOSIS — E039 Hypothyroidism, unspecified: Secondary | ICD-10-CM | POA: Diagnosis present

## 2020-05-21 DIAGNOSIS — J9601 Acute respiratory failure with hypoxia: Secondary | ICD-10-CM

## 2020-05-21 DIAGNOSIS — F329 Major depressive disorder, single episode, unspecified: Secondary | ICD-10-CM | POA: Diagnosis present

## 2020-05-21 DIAGNOSIS — E785 Hyperlipidemia, unspecified: Secondary | ICD-10-CM | POA: Diagnosis present

## 2020-05-21 DIAGNOSIS — I2692 Saddle embolus of pulmonary artery without acute cor pulmonale: Secondary | ICD-10-CM | POA: Diagnosis not present

## 2020-05-21 DIAGNOSIS — R180 Malignant ascites: Secondary | ICD-10-CM | POA: Diagnosis present

## 2020-05-21 DIAGNOSIS — Z20822 Contact with and (suspected) exposure to covid-19: Secondary | ICD-10-CM | POA: Diagnosis present

## 2020-05-21 DIAGNOSIS — Z888 Allergy status to other drugs, medicaments and biological substances status: Secondary | ICD-10-CM | POA: Diagnosis not present

## 2020-05-21 LAB — GLUCOSE, PLEURAL OR PERITONEAL FLUID: Glucose, Fluid: 123 mg/dL

## 2020-05-21 LAB — LACTATE DEHYDROGENASE, PLEURAL OR PERITONEAL FLUID: LD, Fluid: 715 U/L — ABNORMAL HIGH (ref 3–23)

## 2020-05-21 LAB — ECHOCARDIOGRAM COMPLETE
AR max vel: 1.93 cm2
AV Area VTI: 1.84 cm2
AV Area mean vel: 1.74 cm2
AV Mean grad: 3 mmHg
AV Peak grad: 6.1 mmHg
Ao pk vel: 1.23 m/s
Area-P 1/2: 2.62 cm2
Height: 60 in
S' Lateral: 2 cm
Weight: 2074.09 oz

## 2020-05-21 LAB — BODY FLUID CELL COUNT WITH DIFFERENTIAL
Lymphs, Fluid: 29 %
Monocyte-Macrophage-Serous Fluid: 59 % (ref 50–90)
Neutrophil Count, Fluid: 12 % (ref 0–25)
Total Nucleated Cell Count, Fluid: 1831 cu mm — ABNORMAL HIGH (ref 0–1000)

## 2020-05-21 LAB — HEMOGLOBIN A1C
Hgb A1c MFr Bld: 7.1 % — ABNORMAL HIGH (ref 4.8–5.6)
Mean Plasma Glucose: 157.07 mg/dL

## 2020-05-21 LAB — GLUCOSE, CAPILLARY
Glucose-Capillary: 108 mg/dL — ABNORMAL HIGH (ref 70–99)
Glucose-Capillary: 135 mg/dL — ABNORMAL HIGH (ref 70–99)
Glucose-Capillary: 106 mg/dL — ABNORMAL HIGH (ref 70–99)
Glucose-Capillary: 123 mg/dL — ABNORMAL HIGH (ref 70–99)

## 2020-05-21 LAB — PROTEIN, PLEURAL OR PERITONEAL FLUID: Total protein, fluid: 5.8 g/dL

## 2020-05-21 LAB — TROPONIN I (HIGH SENSITIVITY)
Troponin I (High Sensitivity): 73 ng/L — ABNORMAL HIGH (ref <18)
Troponin I (High Sensitivity): 36 ng/L — ABNORMAL HIGH (ref <18)
Troponin I (High Sensitivity): 40 ng/L — ABNORMAL HIGH (ref <18)

## 2020-05-21 LAB — GAMMA GT: GGT: 59 U/L — ABNORMAL HIGH (ref 7–50)

## 2020-05-21 LAB — BRAIN NATRIURETIC PEPTIDE
B Natriuretic Peptide: 484.4 pg/mL — ABNORMAL HIGH (ref 0.0–100.0)
B Natriuretic Peptide: 297.7 pg/mL — ABNORMAL HIGH (ref 0.0–100.0)

## 2020-05-21 LAB — LACTIC ACID, PLASMA
Lactic Acid, Venous: 2 mmol/L (ref 0.5–1.9)
Lactic Acid, Venous: 2.1 mmol/L (ref 0.5–1.9)

## 2020-05-21 LAB — HEPARIN LEVEL (UNFRACTIONATED)
Heparin Unfractionated: 0.91 IU/mL — ABNORMAL HIGH (ref 0.30–0.70)
Heparin Unfractionated: 0.66 IU/mL (ref 0.30–0.70)

## 2020-05-21 LAB — ALBUMIN, PLEURAL OR PERITONEAL FLUID: Albumin, Fluid: 2.8 g/dL

## 2020-05-21 LAB — CBG MONITORING, ED: Glucose-Capillary: 138 mg/dL — ABNORMAL HIGH (ref 70–99)

## 2020-05-21 LAB — GRAM STAIN

## 2020-05-21 LAB — LACTATE DEHYDROGENASE: LDH: 698 U/L — ABNORMAL HIGH (ref 98–192)

## 2020-05-21 MED ORDER — LORATADINE 10 MG PO TABS: 10.0000 mg | ORAL_TABLET | Freq: Every day | ORAL | Status: DC | PRN

## 2020-05-21 MED ORDER — LIDOCAINE HCL (PF) 1 % IJ SOLN
INTRAMUSCULAR | Status: AC
  Filled 2020-05-21: qty 30

## 2020-05-21 MED ORDER — ACETAMINOPHEN 650 MG RE SUPP: 650.0000 mg | Freq: Four times a day (QID) | RECTAL | Status: DC | PRN

## 2020-05-21 MED ORDER — LEVOTHYROXINE SODIUM 75 MCG PO TABS
75.0000 ug | ORAL_TABLET | Freq: Every day | ORAL | Status: DC
  Administered 2020-05-21 – 2020-05-25 (×5): 75 ug via ORAL
  Filled 2020-05-21 (×5): qty 1

## 2020-05-21 MED ORDER — HEPARIN (PORCINE) 25000 UT/250ML-% IV SOLN
950.0000 [IU]/h | INTRAVENOUS | Status: DC
  Administered 2020-05-21: 1100 [IU]/h via INTRAVENOUS
  Administered 2020-05-22 – 2020-05-24 (×3): 950 [IU]/h via INTRAVENOUS
  Filled 2020-05-21 (×4): qty 250

## 2020-05-21 MED ORDER — SIMVASTATIN 20 MG PO TABS
40.0000 mg | ORAL_TABLET | Freq: Every day | ORAL | Status: DC
  Administered 2020-05-21 – 2020-05-24 (×4): 40 mg via ORAL
  Filled 2020-05-21 (×4): qty 2

## 2020-05-21 MED ORDER — ACETAMINOPHEN 325 MG PO TABS
650.0000 mg | ORAL_TABLET | Freq: Four times a day (QID) | ORAL | Status: DC | PRN
  Administered 2020-05-24: 650 mg via ORAL
  Filled 2020-05-21: qty 2

## 2020-05-21 MED ORDER — ALBUTEROL SULFATE (2.5 MG/3ML) 0.083% IN NEBU: 2.5000 mg | INHALATION_SOLUTION | Freq: Four times a day (QID) | RESPIRATORY_TRACT | Status: DC | PRN

## 2020-05-21 MED ORDER — HEPARIN BOLUS VIA INFUSION
4500.0000 [IU] | Freq: Once | INTRAVENOUS | Status: AC
  Administered 2020-05-21: 4500 [IU] via INTRAVENOUS
  Filled 2020-05-21: qty 4500

## 2020-05-21 MED ORDER — SERTRALINE HCL 50 MG PO TABS
50.0000 mg | ORAL_TABLET | Freq: Every day | ORAL | Status: DC
  Administered 2020-05-21 – 2020-05-25 (×5): 50 mg via ORAL
  Filled 2020-05-21 (×5): qty 1

## 2020-05-21 MED ORDER — SODIUM CHLORIDE 0.9 % IV SOLN: INTRAVENOUS | Status: AC

## 2020-05-21 MED ORDER — IOHEXOL 350 MG/ML SOLN
75.0000 mL | Freq: Once | INTRAVENOUS | Status: AC | PRN
  Administered 2020-05-21: 75 mL via INTRAVENOUS

## 2020-05-21 MED ORDER — INSULIN ASPART 100 UNIT/ML ~~LOC~~ SOLN
0.0000 [IU] | SUBCUTANEOUS | Status: DC
  Administered 2020-05-21 – 2020-05-23 (×5): 1 [IU] via SUBCUTANEOUS
  Administered 2020-05-23: 2 [IU] via SUBCUTANEOUS

## 2020-05-21 MED ORDER — ORAL CARE MOUTH RINSE
15.0000 mL | Freq: Two times a day (BID) | OROMUCOSAL | Status: DC
  Administered 2020-05-21 – 2020-05-25 (×9): 15 mL via OROMUCOSAL

## 2020-05-21 MED ORDER — PERFLUTREN LIPID MICROSPHERE
1.0000 mL | INTRAVENOUS | Status: AC | PRN
  Administered 2020-05-21: 5 mL via INTRAVENOUS
  Filled 2020-05-21: qty 10

## 2020-05-21 MED ORDER — PANTOPRAZOLE SODIUM 20 MG PO TBEC
20.0000 mg | DELAYED_RELEASE_TABLET | Freq: Every day | ORAL | Status: DC
  Administered 2020-05-21 – 2020-05-25 (×5): 20 mg via ORAL
  Filled 2020-05-21 (×6): qty 1

## 2020-05-21 NOTE — Progress Notes (Signed)
ANTICOAGULATION CONSULT NOTE - Initial Consult  Pharmacy Consult for Heparin Indication: pulmonary embolus  Allergies  Allergen Reactions  . Metformin And Related Other (See Comments)    Patient Measurements:    Ht 60 in Wt 65 kg  Vital Signs: Temp: 98.2 F (36.8 C) (08/20 1745) Temp Source: Oral (08/20 1745) BP: 129/86 (08/20 2300) Pulse Rate: 79 (08/20 2300)  Labs: Recent Labs    05/20/20 1458 05/20/20 1717  HGB 11.6*  --   HCT 38.4  --   PLT 249  --   CREATININE 1.45*  --   TROPONINIHS 61* 65*    CrCl cannot be calculated (Unknown ideal weight.).   Medical History: Past Medical History:  Diagnosis Date  . Anxiety   . Arthritis    "all over my body" (05/27/2017)  . Chronic lower back pain   . Depression   . Dyspnea    allergy related SOB  . Frequent UTI   . GERD (gastroesophageal reflux disease)   . Headache    "monthly" (05/27/2017)  . History of blood transfusion    "when I had my neck fusion"  . History of hiatal hernia   . Hypercholesteremia   . Hypertension   . Hypothyroid   . Pneumonia 2000s X 1; 05/27/2017  . Seasonal allergies   . Type II diabetes mellitus (HCC)     Medications:  See electronic med rec  Assessment: 79 y.o. F presents with SOB. CT shows Large saddle pulmonary embolus with CT evidence of right heart strain (RV/LV ratio of 1.6) consistent with at least submassive (intermediate risk) PE. Pharmacy consulted for heparin. CBC ok on admission. No AC PTA.  Goal of Therapy:  Heparin level 0.3-0.7 units/ml Monitor platelets by anticoagulation protocol: Yes   Plan:  Heparin IV bolus 4500 units Heparin gtt at 1100 units/hr Will f/u heparin level in 8 hours Daily heparin level and CBC  Sherlon Handing, PharmD, BCPS Please see amion for complete clinical pharmacist phone list 05/21/2020,1:50 AM

## 2020-05-21 NOTE — Progress Notes (Signed)
Bilateral lower extremity venous duplex and IVC/iliac vein duplex completed. Refer to "CV Proc" under chart review to view preliminary results.  Preliminary results discussed with Dr. Marthenia Rolling.  05/21/2020 2:41 PM Kelby Aline., MHA, RVT, RDCS, RDMS

## 2020-05-21 NOTE — H&P (Signed)
History and Physical    Amber Gray HBZ:[id number] DOB: April 21, 1941 DOA: 05/20/2020  PCP: [name], MD Patient coming from: Home  Chief Complaint: Shortness of breath  HPI: Amber Gray is a 79 y.o. female with medical history significant of hypertension, hyperlipidemia, hypothyroidism, GERD, non-insulin-dependent type 2 diabetes, right ankle arthroscopy and debridement for impingement on 03/29/2020 presenting with complaints of shortness of breath.  Patient states she has not been doing well since her recent ankle surgery.  She has been very weak and feels dizzy when she tries to walk.  She feels short of breath.  As such, she has not been able to stay physically active.  Reports having abdominal distention since after her surgery which she attributed to constipation.  She has been taking laxatives at home and now having regular bowel movements.  She was vomiting after her surgery but no longer doing so.  Denies fevers or chest pain.  Denies history of blood clots or malignancy.  No other complaints.  ED Course: Afebrile.  Not tachycardic or hypotensive.  Slightly tachypneic.  Oxygen saturation 84% on room air, improved with 3 L supplemental oxygen.  Labs showing no leukocytosis.  Lactic acid 2.1.  Hemoglobin 11.6, at baseline.  Bicarb 17, anion gap 20.  Blood glucose 187.  BUN 33, creatinine 1.4.  Creatinine was 1.2 on labs done approximately 2 months ago but previously around 0.8.  Alk phos elevated at 201, remainder of LFTs normal.  High-sensitivity troponin 61 >65.  BNP elevated at 577.  SARS-CoV-2 PCR test negative.  CT angiogram chest showing a large saddle pulmonary embolus with CT evidence of right heart strain (RV/LV ratio of 1.6) consistent with at least submassive PE.  Large left and small right pleural effusions.    CT abdomen pelvis showing a large amount of peritoneal nodularity consistent with peritoneal carcinomatosis.  Large midline pelvic mass, measuring up to 12.9 x  8.7 x 9.7 cm concerning for a primary gynecologic malignancy.  ED provider discussed the case with Dr. Madalyn Rob from critical care who recommended admission by hospitalist team.  No thrombolytics recommended as patient is currently hemodynamically stable.  Critical care will consult.  IV heparin started.  Review of Systems:  All systems reviewed and apart from history of presenting illness, are negative.  Past Medical History:  Diagnosis Date  . Anxiety   . Arthritis    "all over my body" (05/27/2017)  . Chronic lower back pain   . Depression   . Dyspnea    allergy related SOB  . Frequent UTI   . GERD (gastroesophageal reflux disease)   . Headache    "monthly" (05/27/2017)  . History of blood transfusion    "when I had my neck fusion"  . History of hiatal hernia   . Hypercholesteremia   . Hypertension   . Hypothyroid   . Pneumonia 2000s X 1; 05/27/2017  . Seasonal allergies   . Type II diabetes mellitus ([place])     Past Surgical History:  Procedure Laterality Date  . ANKLE ARTHROSCOPY Right 03/29/2020   Procedure: RIGHT ANKLE ARTHROSCOPY AND DEBRIDEMENT;  Surgeon: Newt Minion, MD;  Location: [facility];  Service: Orthopedics;  Laterality: Right;  . BACK SURGERY    . BREAST BIOPSY Left   . CATARACT EXTRACTION W/ INTRAOCULAR LENS  IMPLANT, BILATERAL Bilateral   . COLONOSCOPY  10/25/2005   normal  . DILATION AND CURETTAGE OF UTERUS    . PARTIAL HYSTERECTOMY  1968  .  POSTERIOR CERVICAL FUSION/FORAMINOTOMY     "fused 4,5,6"  . TONSILLECTOMY       reports that she quit smoking about 55 years ago. Her smoking use included cigarettes. She has a 6.00 pack-year smoking history. She has never used smokeless tobacco. She reports that she does not drink alcohol and does not use drugs.  Allergies  Allergen Reactions  . Metformin And Related Other (See Comments)    Family History  Problem Relation Age of Onset  . Alzheimer's disease Mother   . Heart attack  Father   . Heart disease Father   . Dwarfism Sister   . Colon cancer Neg Hx   . Colon polyps Neg Hx   . Esophageal cancer Neg Hx   . Rectal cancer Neg Hx   . Stomach cancer Neg Hx     Prior to Admission medications   Medication Sig Start Date End Date Taking? Authorizing Provider  acetaminophen (TYLENOL) 325 MG tablet Take 650 mg by mouth every 6 (six) hours as needed for mild pain.   Yes [provider]  albuterol (PROVENTIL HFA;VENTOLIN HFA) 108 (90 BASE) MCG/ACT inhaler Inhale 2 puffs into the lungs every 6 (six) hours as needed. For wheezing.   Yes [provider]  ALPRAZolam (XANAX) 0.25 MG tablet Take 0.25 mg by mouth daily as needed for anxiety.  06/17/18  Yes [provider]  cetirizine (ZYRTEC) 10 MG tablet Take 10 mg by mouth daily.   Yes [provider]  empagliflozin (JARDIANCE) 10 MG TABS tablet Take 10 mg by mouth daily.   Yes [provider]  furosemide (LASIX) 40 MG tablet Take 40 mg by mouth daily.  04/04/17  Yes [provider]  levothyroxine (SYNTHROID, LEVOTHROID) 75 MCG tablet Take 75 mcg by mouth daily.   Yes [provider]  losartan (COZAAR) 50 MG tablet Take 50 mg by mouth daily.   Yes [provider]  omeprazole (PRILOSEC) 20 MG capsule Take 20 mg by mouth daily. 04/04/17  Yes [provider]  sertraline (ZOLOFT) 50 MG tablet Take 50 mg by mouth daily. 06/17/18  Yes [provider]  simvastatin (ZOCOR) 40 MG tablet Take 40 mg by mouth daily. 05/12/18  Yes [provider]  TRADJENTA 5 MG TABS tablet Take 5 mg by mouth daily. 08/15/18  Yes [provider]  glimepiride (AMARYL) 1 MG tablet Take 0.5 mg by mouth daily. 05/09/17   [provider]  oxyCODONE-acetaminophen (PERCOCET) 5-325 MG tablet Take 1 tablet by mouth every 4 (four) hours as needed for severe pain. Patient not taking: Reported on 05/20/2020 03/29/20 03/29/21  Persons, Bevely Palmer, [place]    Physical  Exam: Vitals:   05/20/20 2300 05/21/20 0145 05/21/20 0200 05/21/20 0215  BP: 129/86 124/82 135/77 132/81  Pulse: 79  76 72  Resp: (!) 21  (!) 21 (!) 22  Temp:      TempSrc:      SpO2: 97%  96% 98%    Physical Exam Constitutional:      General: She is not in acute distress. HENT:     Head: Normocephalic and atraumatic.  Eyes:     Extraocular Movements: Extraocular movements intact.     Conjunctiva/sclera: Conjunctivae normal.  Cardiovascular:     Rate and Rhythm: Normal rate and regular rhythm.     Pulses: Normal pulses.  Pulmonary:     Breath sounds: No wheezing or rales.     Comments: Slightly tachypneic with respiratory rate in the  low 20s Abdominal:     General: Bowel sounds are normal. There is distension.     Palpations: Abdomen is soft. There is mass.     Tenderness: There is no abdominal tenderness. There is no guarding.  Musculoskeletal:        General: Swelling present. No tenderness.     Cervical back: Normal range of motion and neck supple.     Comments: +2 pedal edema bilaterally  Skin:    General: Skin is warm and dry.  Neurological:     General: No focal deficit present.     Mental Status: She is alert and oriented to person, place, and time.     Labs on Admission: I have personally reviewed following labs and imaging studies  CBC: Recent Labs  Lab 05/20/20 1458  WBC 9.4  NEUTROABS 7.6  HGB 11.6*  HCT 38.4  MCV 87.9  PLT 010   Basic Metabolic Panel: Recent Labs  Lab 05/20/20 1458  NA 138  K 3.7  CL 101  CO2 17*  GLUCOSE 187*  BUN 33*  CREATININE 1.45*  CALCIUM 9.3   GFR: CrCl cannot be calculated (Unknown ideal weight.). Liver Function Tests: Recent Labs  Lab 05/20/20 1458  AST 27  ALT 14  ALKPHOS 201*  BILITOT 0.8  PROT 8.2*  ALBUMIN 3.1*   No results for input(s): LIPASE, AMYLASE in the last 168 hours. No results for input(s): AMMONIA in the last 168 hours. Coagulation Profile: No results for input(s): INR, PROTIME in  the last 168 hours. Cardiac Enzymes: No results for input(s): CKTOTAL, CKMB, CKMBINDEX, TROPONINI in the last 168 hours. BNP (last 3 results) No results for input(s): PROBNP in the last 8760 hours. HbA1C: No results for input(s): HGBA1C in the last 72 hours. CBG: No results for input(s): GLUCAP in the last 168 hours. Lipid Profile: No results for input(s): CHOL, HDL, LDLCALC, TRIG, CHOLHDL, LDLDIRECT in the last 72 hours. Thyroid Function Tests: No results for input(s): TSH, T4TOTAL, FREET4, T3FREE, THYROIDAB in the last 72 hours. Anemia Panel: No results for input(s): VITAMINB12, FOLATE, FERRITIN, TIBC, IRON, RETICCTPCT in the last 72 hours. Urine analysis:    Component Value Date/Time   COLORURINE STRAW (A) 05/27/2017 0627   APPEARANCEUR CLEAR 05/27/2017 0627   LABSPEC 1.011 05/27/2017 0627   PHURINE 6.0 05/27/2017 0627   GLUCOSEU >=500 (A) 05/27/2017 0627   HGBUR NEGATIVE 05/27/2017 0627   BILIRUBINUR NEGATIVE 05/27/2017 0627   KETONESUR NEGATIVE 05/27/2017 0627   PROTEINUR NEGATIVE 05/27/2017 0627   UROBILINOGEN 1.0 09/25/2012 1352   NITRITE NEGATIVE 05/27/2017 0627   LEUKOCYTESUR NEGATIVE 05/27/2017 0627    Radiological Exams on Admission: DG Chest 2 View  Result Date: 05/20/2020 CLINICAL DATA:  79 year old female with shortness of breath. EXAM: CHEST - 2 VIEW COMPARISON:  Chest radiograph dated 12/04/2019. FINDINGS: Small left pleural effusion and left lung base atelectasis or infiltrate. Clinical correlation and follow-up to resolution recommended. The right lung is clear. No pneumothorax. Stable cardiac silhouette. Mild bilateral hilar prominence, likely pulmonary hypertension. There is osteopenia with degenerative changes of the spine. No acute osseous pathology. IMPRESSION: Small left pleural effusion and left lung base atelectasis or infiltrate. Electronically Signed   By: Anner Crete M.D.   On: 05/20/2020 15:39   CT Angio Chest PE W and/or Wo Contrast  Result  Date: 05/21/2020 CLINICAL DATA:  Abdominal pain and shortness of breath. Recent surgery. EXAM: CT ANGIOGRAPHY CHEST CT ABDOMEN AND PELVIS WITH CONTRAST TECHNIQUE: Multidetector CT imaging of  the chest was performed using the standard protocol during bolus administration of intravenous contrast. Multiplanar CT image reconstructions and MIPs were obtained to evaluate the vascular anatomy. Multidetector CT imaging of the abdomen and pelvis was performed using the standard protocol during bolus administration of intravenous contrast. CONTRAST:  32m OMNIPAQUE IOHEXOL 350 MG/ML SOLN COMPARISON:  None. FINDINGS: CTA CHEST FINDINGS Cardiovascular: Contrast injection is sufficient to demonstrate satisfactory opacification of the pulmonary arteries to the segmental level.There is a saddle pulmonary embolus extending into all proximal lobar branches. There is evidence of right heart strain with an RV to LV ratio of 1.6.the visualized aorta is normal. There is a normal 3-vessel arch branching pattern. Heart size is normal, without pericardial effusion. Mediastinum/Nodes: No mediastinal, hilar or axillary lymphadenopathy. The visualized thyroid and thoracic esophageal course are unremarkable. Lungs/Pleura: Small right and large left pleural effusions. Musculoskeletal: No chest wall abnormality. No acute or significant osseous findings. Review of the MIP images confirms the above findings. CT ABDOMEN and PELVIS FINDINGS Hepatobiliary: Normal hepatic contours and density. No visible biliary dilatation. Perihepatic ascites. Normal gallbladder. Pancreas: Normal contours without ductal dilatation. No peripancreatic fluid collection. Spleen: Normal. Adrenals/Urinary Tract: --Adrenal glands: Normal. --Right kidney/ureter: No hydronephrosis or perinephric stranding. No nephrolithiasis. No obstructing ureteral stones. --Left kidney/ureter: No hydronephrosis or perinephric stranding. No nephrolithiasis. No obstructing ureteral stones.  --Urinary bladder: Unremarkable. Stomach/Bowel: --Stomach/Duodenum: Intermediate sized hiatal hernia. --Small bowel: No dilatation or inflammation. --Colon: No focal abnormality. --Appendix: Not visualized Vascular/Lymphatic: Normal course and caliber of the major abdominal vessels. No abdominal or pelvic lymphadenopathy. Reproductive: There is a heterogeneous, multilobulated midline pelvic mass that measures 12.9 x 8.7 x 9.7 cm. It is unclear whether some component of this is the uterus. The ovaries are not separately visualized. Musculoskeletal. No bony spinal canal stenosis or focal osseous abnormality. Other: There is a large amount of peritoneal nodularity, particularly in the left upper quadrant. A left anterior abdomen circumscribed fluid collection measures 6.2 x 3.9 cm. IMPRESSION: 1. Large saddle pulmonary embolus with CT evidence of right heart strain (RV/LV ratio of 1.6) consistent with at least submassive (intermediate risk) PE. The presence of right heart strain has been associated with an increased risk of morbidity and mortality. 2. Large left and small right pleural effusions. 3. Large amount of peritoneal nodularity, consistent with peritoneal carcinomatosis. 4. Large midline pelvic mass, measuring up to 12.9 x 8.7 x 9.7 cm. 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 Critical Value/emergent results were called by telephone at the time of interpretation on 05/21/2020 at 1:09 am [facility] who verbally acknowledged these results. Electronically Signed   By: KUlyses JarredM.D.   On: 05/21/2020 01:17   CT ABDOMEN PELVIS W CONTRAST  Result Date: 05/21/2020 CLINICAL DATA:  Abdominal pain and shortness of breath. Recent surgery. EXAM: CT ANGIOGRAPHY CHEST CT ABDOMEN AND PELVIS WITH CONTRAST TECHNIQUE: Multidetector CT imaging of the chest was performed using the standard protocol during bolus  administration of intravenous contrast. Multiplanar CT image reconstructions and MIPs were obtained to evaluate the vascular anatomy. Multidetector CT imaging of the abdomen and pelvis was performed using the standard protocol during bolus administration of intravenous contrast. CONTRAST:  767mOMNIPAQUE IOHEXOL 350 MG/ML SOLN COMPARISON:  None. FINDINGS: CTA CHEST FINDINGS Cardiovascular: Contrast injection is sufficient to demonstrate satisfactory opacification of the pulmonary arteries to the segmental level.There is a saddle pulmonary embolus extending into all proximal  lobar branches. There is evidence of right heart strain with an RV to LV ratio of 1.6.the visualized aorta is normal. There is a normal 3-vessel arch branching pattern. Heart size is normal, without pericardial effusion. Mediastinum/Nodes: No mediastinal, hilar or axillary lymphadenopathy. The visualized thyroid and thoracic esophageal course are unremarkable. Lungs/Pleura: Small right and large left pleural effusions. Musculoskeletal: No chest wall abnormality. No acute or significant osseous findings. Review of the MIP images confirms the above findings. CT ABDOMEN and PELVIS FINDINGS Hepatobiliary: Normal hepatic contours and density. No visible biliary dilatation. Perihepatic ascites. Normal gallbladder. Pancreas: Normal contours without ductal dilatation. No peripancreatic fluid collection. Spleen: Normal. Adrenals/Urinary Tract: --Adrenal glands: Normal. --Right kidney/ureter: No hydronephrosis or perinephric stranding. No nephrolithiasis. No obstructing ureteral stones. --Left kidney/ureter: No hydronephrosis or perinephric stranding. No nephrolithiasis. No obstructing ureteral stones. --Urinary bladder: Unremarkable. Stomach/Bowel: --Stomach/Duodenum: Intermediate sized hiatal hernia. --Small bowel: No dilatation or inflammation. --Colon: No focal abnormality. --Appendix: Not visualized Vascular/Lymphatic: Normal course and caliber of the  major abdominal vessels. No abdominal or pelvic lymphadenopathy. Reproductive: There is a heterogeneous, multilobulated midline pelvic mass that measures 12.9 x 8.7 x 9.7 cm. It is unclear whether some component of this is the uterus. The ovaries are not separately visualized. Musculoskeletal. No bony spinal canal stenosis or focal osseous abnormality. Other: There is a large amount of peritoneal nodularity, particularly in the left upper quadrant. A left anterior abdomen circumscribed fluid collection measures 6.2 x 3.9 cm. IMPRESSION: 1. Large saddle pulmonary embolus with CT evidence of right heart strain (RV/LV ratio of 1.6) consistent with at least submassive (intermediate risk) PE. The presence of right heart strain has been associated with an increased risk of morbidity and mortality. 2. Large left and small right pleural effusions. 3. Large amount of peritoneal nodularity, consistent with peritoneal carcinomatosis. 4. Large midline pelvic mass, measuring up to 12.9 x 8.7 x 9.7 cm. 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 Critical Value/emergent results were called by telephone at the time of interpretation on 05/21/2020 at 1:09 am to provider [facility], who verbally acknowledged these results. Electronically Signed   By: Ulyses Jarred M.D.   On: 05/21/2020 01:17    EKG: Independently reviewed.  Sinus rhythm, normal days.  No significant change since prior tracing.  Assessment/Plan Principal Problem:   Acute pulmonary embolism (HCC) Active Problems:   Acute respiratory failure with hypoxia (HCC)   Pleural effusion   AKI (acute kidney injury) (HCC)   Lactic acidosis   Acute hypoxic respiratory failure secondary to acute large saddle pulmonary embolus with CT evidence of right heart strain: Precipitating factors include likely underlying malignancy and immobility due to recent ankle surgery. CT angiogram chest  showing a large saddle pulmonary embolus with CT evidence of right heart strain (RV/LV ratio of 1.6) consistent with at least submassive PE.  Slightly tachypneic.  Oxygen saturation 84% on room air, currently satting well on 3 L supplemental oxygen.  No tachycardia or hypotension.  High-sensitivity troponin mildly elevated (61 > 65).  BNP elevated at 577.  Critical care consulted and not recommending thrombolytics as patient is hemodynamically stable. -Admit to progressive care unit for close monitoring.  Continue IV heparin.  Order transthoracic echocardiogram and bilateral lower extremity Dopplers.  Trend troponin and BNP.  Continuous pulse ox, supplemental oxygen.  Concern for primary gynecologic malignancy: CT abdomen pelvis showing a large amount of peritoneal nodularity consistent with peritoneal carcinomatosis.  Large midline pelvic mass, measuring up to 12.9 x 8.7 x 9.7 cm concerning for a primary gynecologic malignancy. -Please consult gyn onc in a.m.  Large left pleural effusion: Seen on CT along with a small right pleural effusion.   -IR guided thoracentesis for left pleural effusion, pleural fluid analysis labs  Mild lactic acidosis: Suspect related to hypoxia in setting of acute large saddle PE.  No fever, leukocytosis, or other signs of sepsis.  Lactic acid mildly elevated at 2.1. -Continue IV heparin and supplemental oxygen for PE as mentioned above.  Trend lactate.  Mild AKI: Likely related to home ARB and diuretic use.  BUN 33, creatinine 1.4.  Creatinine was 1.2 on labs done approximately 2 months ago but previously around 0.8. -Gentle IV fluid hydration.  Monitor renal function and urine output.  Avoid nephrotoxic agents.  Hold losartan and Lasix at this time.  High anion gap metabolic acidosis: Likely due to mild lactic acidosis and mild AKI. -Gentle IV fluid hydration and continue to monitor BMP  Elevated alkaline phosphatase: Alk phos elevated at 201, remainder of LFTs  normal. -Check GGT level to differentiate between intrahepatic versus extrahepatic cause of elevated alkaline phosphatase  Hyperlipidemia -Continue Zocor  Hypothyroidism -Continue Synthroid  Non-insulin-dependent type 2 diabetes -Check A1c.  Sliding scale insulin sensitive and CBG checks.  DVT prophylaxis: Heparin Code Status: Patient wishes to be full code. Family Communication: No family available at this time. Disposition Plan: Status is: Inpatient  Remains inpatient appropriate because:Ongoing diagnostic testing needed not appropriate for outpatient work up, IV treatments appropriate due to intensity of illness or inability to take PO and Inpatient level of care appropriate due to severity of illness   Dispo: The patient is from: Home              Anticipated d/c is to: Home              Anticipated d/c date is: 3 days              Patient currently is not medically stable to d/c.  The medical decision making on this patient was of high complexity and the patient is at high risk for clinical deterioration, therefore this is a level 3 visit.  Shela Leff MD [facility]  If 7PM-7AM, please contact night-coverage [email or web address]  05/21/2020, 2:35 AM

## 2020-05-21 NOTE — Procedures (Signed)
Echo attempted. Patient being transported to ultrasound. Will attempt again later.

## 2020-05-21 NOTE — Progress Notes (Signed)
Initial Nutrition Assessment  DOCUMENTATION CODES:   Not applicable  INTERVENTION:  With diet advancement recommend -Ensure Enlive po BID, each supplement provides 350 kcal and 20 grams of protein -MVI with minerals daily   NUTRITION DIAGNOSIS:   Increased nutrient needs related to acute illness (acute pulmonary embolism; large pelvic mass concerning for malignancy) as evidenced by estimated needs.    GOAL:   Patient will meet greater than or equal to 90% of their needs    MONITOR:   Labs, I & O's, Diet advancement, Weight trends, PO intake, Supplement acceptance  REASON FOR ASSESSMENT:   Malnutrition Screening Tool    ASSESSMENT:  RD working remotely.  79 year old female admitted with acute pulmonary embolism after presenting with SOB. Past medical history significant of HTN, HLD, hypothyroidism, GERD, DM2, frequent UTI, chronic lower back pain, and recent right ankle arthroscopy s/p debridement for impingement in June.  CT chest with evidence of right heart strain, large left and small right pleural effusions s/p therapeutic left thoracentesis yielding 580 ml this morning. CT abdomen pelvis showing peritoneal carcinomatosis and large midline pelvic mass concerning for primary gynecologic malignancy. GYN consult pending.  RD attempted to contact pt via phone, however no answer and unable to obtain nutrition history at this time. Per H&P, pt reports abdominal distention, vomiting and constipation after surgery now resolved, suspect related decreased po intake She is currently NPO. Patient with significant weight loss noted below and would greatly benefit from nutrition dense supplement. Recommend Ensure BID with diet advancement.   Current wt 129.36 lb, non-pitting BLE edema noted. Per chart, weights have trended down 15 lb (10.4%) in the last 2 months; significant. Given advanced age as well as recent past medical history, suspect degree of malnutrition however unable to  identify at this time.  Medications reviewed and include: SSI IVF: NaCl @ 75 ml/hr IV Heparin Labs: CBG 135, BNP 297.7 (H) trending down Lab Results  Component Value Date   HGBA1C 7.1 (H) 05/21/2020    NUTRITION - FOCUSED PHYSICAL EXAM: Unable to complete at this time, RD working remotely.  Diet Order:   Diet Order            Diet NPO time specified  Diet effective now                 EDUCATION NEEDS:   No education needs have been identified at this time  Skin:  Skin Assessment: Reviewed RN Assessment  Last BM:  8/21-type 7  Height:   Ht Readings from Last 1 Encounters:  05/21/20 5' (1.524 m)    Weight:   Wt Readings from Last 1 Encounters:  05/21/20 58.8 kg    Ideal Body Weight:  45.5 kg  BMI:  Body mass index is 25.32 kg/m.  Estimated Nutritional Needs:   Kcal:  1600-1800  Protein:  80-90  Fluid:  >/= 1.5 L/day   Lajuan Lines, RD, LDN Clinical Nutrition After Hours/Weekend Pager # in [phone]

## 2020-05-21 NOTE — ED Notes (Signed)
Patient transported to CT scan . 

## 2020-05-21 NOTE — Progress Notes (Signed)
[facility] for Heparin Indication: pulmonary embolus  Allergies  Allergen Reactions  . Metformin And Related Other (See Comments)    Patient Measurements: Height: 5' (152.4 cm) Weight: 58.8 kg (129 lb 10.1 oz) IBW/kg (Calculated) : 45.5  Heparin dosing weight: 57.5 kg  Vital Signs: Temp: 97.5 F (36.4 C) (08/21 0710) Temp Source: Oral (08/21 0710) BP: 131/79 (08/21 1050) Pulse Rate: 67 (08/21 0710)  Labs: Recent Labs    05/20/20 1458 05/20/20 1458 05/20/20 1717 05/21/20 0247 05/21/20 1132  HGB 11.6*  --   --   --   --   HCT 38.4  --   --   --   --   PLT 249  --   --   --   --   HEPARINUNFRC  --   --   --   --  0.91*  CREATININE 1.45*  --   --   --   --   TROPONINIHS 61*   < > 65* 73* 36*   < > = values in this interval not displayed.    Estimated Creatinine Clearance: 25.2 mL/min (A) (by C-G formula based on SCr of 1.45 mg/dL (H)).   Medical History: Past Medical History:  Diagnosis Date  . Anxiety   . Arthritis    "all over my body" (05/27/2017)  . Chronic lower back pain   . Depression   . Dyspnea    allergy related SOB  . Frequent UTI   . GERD (gastroesophageal reflux disease)   . Headache    "monthly" (05/27/2017)  . History of blood transfusion    "when I had my neck fusion"  . History of hiatal hernia   . Hypercholesteremia   . Hypertension   . Hypothyroid   . Pneumonia 2000s X 1; 05/27/2017  . Seasonal allergies   . Type II diabetes mellitus (HCC)    Assessment: 79 y.o. F presents with SOB. CT shows Large saddle pulmonary embolus with CT evidence of right heart strain (RV/LV ratio of 1.6) consistent with at least submassive (intermediate risk) PE. Pharmacy consulted for heparin. CBC ok on admission. No AC PTA.  Heparin level is supratherapeutic today at 0.91 after heparin 4500 unit bolus early this morning followed by gtt starting at 1100 units/hr. No bleeding per RN.   Goal of Therapy:  Heparin level  0.3-0.7 units/ml Monitor platelets by anticoagulation protocol: Yes   Plan:  Decrease heparin to 950 units/hr Will f/u heparin level in 8 hours Daily heparin level and CBC Monitor for s/sx bleeding  Rebbeca Paul, PharmD PGY1 Pharmacy Resident 05/21/2020 12:43 PM  Please check [email or web address] for unit-specific pharmacy phone numbers.

## 2020-05-21 NOTE — Progress Notes (Signed)
[facility] for Heparin Indication: pulmonary embolus  Allergies  Allergen Reactions  . Metformin And Related Other (See Comments)    Patient Measurements: Height: 5' (152.4 cm) Weight: 58.8 kg (129 lb 10.1 oz) IBW/kg (Calculated) : 45.5  Heparin dosing weight: 57.5 kg  Vital Signs: Temp: 97.6 F (36.4 C) (08/21 2024) Temp Source: Oral (08/21 2024) BP: 130/82 (08/21 2024) Pulse Rate: 73 (08/21 2024)  Labs: Recent Labs    05/20/20 1458 05/20/20 1717 05/21/20 0247 05/21/20 1132 05/21/20 1458 05/21/20 2048  HGB 11.6*  --   --   --   --   --   HCT 38.4  --   --   --   --   --   PLT 249  --   --   --   --   --   HEPARINUNFRC  --   --   --  0.91*  --  0.66  CREATININE 1.45*  --   --   --   --   --   TROPONINIHS 61*   < > 73* 36* 40*  --    < > = values in this interval not displayed.    Estimated Creatinine Clearance: 25.2 mL/min (A) (by C-G formula based on SCr of 1.45 mg/dL (H)).   Medical History: Past Medical History:  Diagnosis Date  . Anxiety   . Arthritis    "all over my body" (05/27/2017)  . Chronic lower back pain   . Depression   . Dyspnea    allergy related SOB  . Frequent UTI   . GERD (gastroesophageal reflux disease)   . Headache    "monthly" (05/27/2017)  . History of blood transfusion    "when I had my neck fusion"  . History of hiatal hernia   . Hypercholesteremia   . Hypertension   . Hypothyroid   . Pneumonia 2000s X 1; 05/27/2017  . Seasonal allergies   . Type II diabetes mellitus (HCC)    Assessment: 79 y.o. F presents with SOB. CT shows Large saddle pulmonary embolus with CT evidence of right heart strain (RV/LV ratio of 1.6) consistent with at least submassive (intermediate risk) PE. Pharmacy consulted for heparin. CBC ok on admission. No AC PTA.  Heparin level is now at goal after rate adjustment this afternoon. No bleeding per RN.   Goal of Therapy:  Heparin level 0.3-0.7 units/ml Monitor  platelets by anticoagulation protocol: Yes   Plan:  Continue heparin at 950 units/hr Daily heparin level and CBC Monitor for s/sx bleeding  Erin Hearing PharmD., BCPS Clinical Pharmacist 05/21/2020 9:26 PM   Please check [email or web address] for unit-specific pharmacy phone numbers.

## 2020-05-21 NOTE — Consult Note (Signed)
NAME:  Amber Gray, MRN:  [id number], DOB:  Mar 13, 1941, LOS: 0 ADMISSION DATE:  05/20/2020, CONSULTATION DATE:  05/21/20 REFERRING MD:  EDP, CHIEF COMPLAINT:  Short of breath.   Brief History   79 year old woman with hypertension, HLD, Hypothyroidism, GERD, DM2, here with SOB. Recent R ankle arthroscopy and debridement for impingement 03/29/20.   History of present illness   SOB, DOE on minimal exertion since ankle surgery.  Baseline BP per patient is 425Z systolic  Past Medical History  hypertension, HLD, Hypothyroidism, GERD, DM2, here with SOB. Recent R ankle arthroscopy and debridement   Significant Hospital Events     Consults:    Procedures:   Significant Diagnostic Tests:   IMPRESSION: 1. Large saddle pulmonary embolus with CT evidence of right heart strain (RV/LV ratio of 1.6) consistent with at least submassive (intermediate risk) PE. The presence of right heart strain has been associated with an increased risk of morbidity and mortality. 2. Large left and small right pleural effusions. 3. Large amount of peritoneal nodularity, consistent with peritoneal carcinomatosis. 4. Large midline pelvic mass, measuring up to 12.9 x 8.7 x 9.7 cm. 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    Micro Data:    Antimicrobials:    Interim history/subjective:    Objective   Blood pressure 123/76, pulse 70, temperature 97.6 F (36.4 C), temperature source Oral, resp. rate 18, height 5' (1.524 m), weight 58.8 kg, SpO2 97 %.        Intake/Output Summary (Last 24 hours) at 05/21/2020 0718 Last data filed at 05/21/2020 5638 Gross per 24 hour  Intake 388.69 ml  Output --  Net 388.69 ml   Filed Weights   05/21/20 0448  Weight: 58.8 kg    Examination: General: NAD    HENT: NCAt Lungs: CTAB Cardiovascular: RRR  Abdomen: NT ND NBS  Extremities: no edema Neuro: awake and  alert   Resolved Hospital Problem list     Assessment & Plan:  PE, large RV strain  Echo to eval RV function.  Cont heparin Consider TPA if worsens   Admit to [facility]   Best practice:    Labs   CBC: Recent Labs  Lab 05/20/20 1458  WBC 9.4  NEUTROABS 7.6  HGB 11.6*  HCT 38.4  MCV 87.9  PLT 756    Basic Metabolic Panel: Recent Labs  Lab 05/20/20 1458  NA 138  K 3.7  CL 101  CO2 17*  GLUCOSE 187*  BUN 33*  CREATININE 1.45*  CALCIUM 9.3   GFR: Estimated Creatinine Clearance: 25.2 mL/min (A) (by C-G formula based on SCr of 1.45 mg/dL (H)). Recent Labs  Lab 05/20/20 1458 05/20/20 2332 05/21/20 0126  WBC 9.4  --   --   LATICACIDVEN  --  2.1* 2.0*    Liver Function Tests: Recent Labs  Lab 05/20/20 1458  AST 27  ALT 14  ALKPHOS 201*  BILITOT 0.8  PROT 8.2*  ALBUMIN 3.1*   No results for input(s): LIPASE, AMYLASE in the last 168 hours. No results for input(s): AMMONIA in the last 168 hours.  ABG    Component Value Date/Time   PHART 7.339 (L) 05/30/2017 0540   PCO2ART 32.5 05/30/2017 0540   PO2ART 63.9 (L) 05/30/2017 0540   HCO3 17.1 (L) 05/30/2017 0540   ACIDBASEDEF 7.6 (H) 05/30/2017 0540   O2SAT 93.0 05/30/2017 0540     Coagulation  Profile: No results for input(s): INR, PROTIME in the last 168 hours.  Cardiac Enzymes: No results for input(s): CKTOTAL, CKMB, CKMBINDEX, TROPONINI in the last 168 hours.  HbA1C: Hgb A1c MFr Bld  Date/Time Value Ref Range Status  05/21/2020 02:47 AM 7.1 (H) 4.8 - 5.6 % Final    Comment:    (NOTE) Pre diabetes:          5.7%-6.4%  Diabetes:              >6.4%  Glycemic control for   <7.0% adults with diabetes   06/27/2015 06:40 AM 7.8 (H) 4.8 - 5.6 % Final    Comment:    (NOTE)         Pre-diabetes: 5.7 - 6.4         Diabetes: >6.4         Glycemic control for adults with diabetes: <7.0     CBG: Recent Labs  Lab 05/21/20 0309  GLUCAP 138*    Review of Systems:     Past Medical  History  She,  has a past medical history of Anxiety, Arthritis, Chronic lower back pain, Depression, Dyspnea, Frequent UTI, GERD (gastroesophageal reflux disease), Headache, History of blood transfusion, History of hiatal hernia, Hypercholesteremia, Hypertension, Hypothyroid, Pneumonia (2000s X 1; 05/27/2017), Seasonal allergies, and Type II diabetes mellitus ([place]).   Surgical History    Past Surgical History:  Procedure Laterality Date  . ANKLE ARTHROSCOPY Right 03/29/2020   Procedure: RIGHT ANKLE ARTHROSCOPY AND DEBRIDEMENT;  Surgeon: Newt Minion, MD;  Location: [facility];  Service: Orthopedics;  Laterality: Right;  . BACK SURGERY    . BREAST BIOPSY Left   . CATARACT EXTRACTION W/ INTRAOCULAR LENS  IMPLANT, BILATERAL Bilateral   . COLONOSCOPY  10/25/2005   normal  . DILATION AND CURETTAGE OF UTERUS    . PARTIAL HYSTERECTOMY  1968  . POSTERIOR CERVICAL FUSION/FORAMINOTOMY     "fused 4,5,6"  . TONSILLECTOMY       Social History   reports that she quit smoking about 55 years ago. Her smoking use included cigarettes. She has a 6.00 pack-year smoking history. She has never used smokeless tobacco. She reports that she does not drink alcohol and does not use drugs.   Family History   Her family history includes Alzheimer's disease in her mother; Dwarfism in her sister; Heart attack in her father; Heart disease in her father. There is no history of Colon cancer, Colon polyps, Esophageal cancer, Rectal cancer, or Stomach cancer.   Allergies Allergies  Allergen Reactions  . Metformin And Related Other (See Comments)     Home Medications  Prior to Admission medications   Medication Sig Start Date End Date Taking? Authorizing Provider  acetaminophen (TYLENOL) 325 MG tablet Take 650 mg by mouth every 6 (six) hours as needed for mild pain.   Yes [provider]  albuterol (PROVENTIL HFA;VENTOLIN HFA) 108 (90 BASE) MCG/ACT inhaler Inhale 2 puffs into the lungs  every 6 (six) hours as needed. For wheezing.   Yes [provider]  ALPRAZolam (XANAX) 0.25 MG tablet Take 0.25 mg by mouth daily as needed for anxiety.  06/17/18  Yes [provider]  cetirizine (ZYRTEC) 10 MG tablet Take 10 mg by mouth daily.   Yes [provider]  empagliflozin (JARDIANCE) 10 MG TABS tablet Take 10 mg by mouth daily.   Yes [provider]  furosemide (LASIX) 40 MG tablet Take 40 mg by mouth daily.  04/04/17  Yes [provider]  levothyroxine (SYNTHROID, LEVOTHROID) 75 MCG tablet Take 75 mcg by mouth daily.   Yes [provider]  losartan (COZAAR) 50 MG tablet Take 50 mg by mouth daily.   Yes [provider]  omeprazole (PRILOSEC) 20 MG capsule Take 20 mg by mouth daily. 04/04/17  Yes [provider]  sertraline (ZOLOFT) 50 MG tablet Take 50 mg by mouth daily. 06/17/18  Yes [provider]  simvastatin (ZOCOR) 40 MG tablet Take 40 mg by mouth daily. 05/12/18  Yes [provider]  TRADJENTA 5 MG TABS tablet Take 5 mg by mouth daily. 08/15/18  Yes [provider]  glimepiride (AMARYL) 1 MG tablet Take 0.5 mg by mouth daily. 05/09/17   [provider]  oxyCODONE-acetaminophen (PERCOCET) 5-325 MG tablet Take 1 tablet by mouth every 4 (four) hours as needed for severe pain. Patient not taking: Reported on 05/20/2020 03/29/20 03/29/21  Persons, Bevely Palmer, [place]     Critical care time: 74

## 2020-05-21 NOTE — Procedures (Signed)
Ultrasound-guided diagnostic and therapeutic left thoracentesis performed yielding 580 cc of slightly hazy, amber fluid. No immediate complications. Follow-up chest x-ray pending. The fluid was sent to the lab for preordered studies. EBL none.

## 2020-05-21 NOTE — Progress Notes (Signed)
Brief note: -Patient was admitted earlier today. -As per HPI "Amber Gray is a 79 y.o. female with medical history significant of hypertension, hyperlipidemia, hypothyroidism, GERD, non-insulin-dependent type 2 diabetes, right ankle arthroscopy and debridement for impingement on 03/29/2020 presenting with complaints of shortness of breath.  Patient states she has not been doing well since her recent ankle surgery.  She has been very weak and feels dizzy when she tries to walk.  She feels short of breath.  As such, she has not been able to stay physically active.  Reports having abdominal distention since after her surgery which she attributed to constipation.  She has been taking laxatives at home and now having regular bowel movements.  She was vomiting after her surgery but no longer doing so.  Denies fevers or chest pain.  Denies history of blood clots or malignancy.  No other complaints.  ED Course: Afebrile.  Not tachycardic or hypotensive.  Slightly tachypneic.  Oxygen saturation 84% on room air, improved with 3 L supplemental oxygen.  Labs showing no leukocytosis.  Lactic acid 2.1.  Hemoglobin 11.6, at baseline.  Bicarb 17, anion gap 20.  Blood glucose 187.  BUN 33, creatinine 1.4.  Creatinine was 1.2 on labs done approximately 2 months ago but previously around 0.8.  Alk phos elevated at 201, remainder of LFTs normal.  High-sensitivity troponin 61 >65.  BNP elevated at 577.  SARS-CoV-2 PCR test negative.  CT angiogram chest showing a large saddle pulmonary embolus with CT evidence of right heart strain (RV/LV ratio of 1.6) consistent with at least submassive PE.  Large left and small right pleural effusions.    CT abdomen pelvis showing a large amount of peritoneal nodularity consistent with peritoneal carcinomatosis.  Large midline pelvic mass, measuring up to 12.9 x 8.7 x 9.7 cm concerning for a primary gynecologic malignancy.  ED provider discussed the case with Dr. Madalyn Rob from  critical care who recommended admission by hospitalist team.  No thrombolytics recommended as patient is currently hemodynamically stable.  Critical care will consult.  IV heparin started".  Doppler ultrasound of the lower extremities revealed DVT left lower extremity.  Called and sent secured text message to [facility]Oncology, awaiting response.  Further management will depend on hospital course.

## 2020-05-22 ENCOUNTER — Other Ambulatory Visit: Payer: Self-pay

## 2020-05-22 DIAGNOSIS — J9601 Acute respiratory failure with hypoxia: Secondary | ICD-10-CM

## 2020-05-22 DIAGNOSIS — J90 Pleural effusion, not elsewhere classified: Secondary | ICD-10-CM

## 2020-05-22 DIAGNOSIS — E872 Acidosis: Secondary | ICD-10-CM

## 2020-05-22 DIAGNOSIS — N179 Acute kidney failure, unspecified: Secondary | ICD-10-CM

## 2020-05-22 LAB — BASIC METABOLIC PANEL
Anion gap: 13 (ref 5–15)
BUN: 27 mg/dL — ABNORMAL HIGH (ref 8–23)
CO2: 18 mmol/L — ABNORMAL LOW (ref 22–32)
Calcium: 8.5 mg/dL — ABNORMAL LOW (ref 8.9–10.3)
Chloride: 105 mmol/L (ref 98–111)
Creatinine, Ser: 1.27 mg/dL — ABNORMAL HIGH (ref 0.44–1.00)
GFR calc Af Amer: 46 mL/min — ABNORMAL LOW (ref >60)
GFR calc non Af Amer: 40 mL/min — ABNORMAL LOW (ref >60)
Glucose, Bld: 111 mg/dL — ABNORMAL HIGH (ref 70–99)
Potassium: 3.5 mmol/L (ref 3.5–5.1)
Sodium: 136 mmol/L (ref 135–145)

## 2020-05-22 LAB — CBC
HCT: 33.5 % — ABNORMAL LOW (ref 36.0–46.0)
Hemoglobin: 10.3 g/dL — ABNORMAL LOW (ref 12.0–15.0)
MCH: 26.6 pg (ref 26.0–34.0)
MCHC: 30.7 g/dL (ref 30.0–36.0)
MCV: 86.6 fL (ref 80.0–100.0)
Platelets: 239 10*3/uL (ref 150–400)
RBC: 3.87 MIL/uL (ref 3.87–5.11)
RDW: 15.7 % — ABNORMAL HIGH (ref 11.5–15.5)
WBC: 8 10*3/uL (ref 4.0–10.5)
nRBC: 0 % (ref 0.0–0.2)

## 2020-05-22 LAB — GLUCOSE, CAPILLARY
Glucose-Capillary: 105 mg/dL — ABNORMAL HIGH (ref 70–99)
Glucose-Capillary: 107 mg/dL — ABNORMAL HIGH (ref 70–99)
Glucose-Capillary: 116 mg/dL — ABNORMAL HIGH (ref 70–99)
Glucose-Capillary: 118 mg/dL — ABNORMAL HIGH (ref 70–99)
Glucose-Capillary: 139 mg/dL — ABNORMAL HIGH (ref 70–99)
Glucose-Capillary: 147 mg/dL — ABNORMAL HIGH (ref 70–99)
Glucose-Capillary: 96 mg/dL (ref 70–99)

## 2020-05-22 LAB — MAGNESIUM: Magnesium: 2.1 mg/dL (ref 1.7–2.4)

## 2020-05-22 LAB — HEPARIN LEVEL (UNFRACTIONATED): Heparin Unfractionated: 0.65 IU/mL (ref 0.30–0.70)

## 2020-05-22 MED ORDER — POTASSIUM CHLORIDE CRYS ER 20 MEQ PO TBCR
40.0000 meq | EXTENDED_RELEASE_TABLET | Freq: Once | ORAL | Status: AC
  Administered 2020-05-22: 40 meq via ORAL
  Filled 2020-05-22: qty 2

## 2020-05-22 NOTE — Progress Notes (Signed)
PROGRESS NOTE    Amber Gray  DDU:[id number] DOB: [birth year] DOA: 05/20/2020 PCP: [name], MD  Outpatient Specialists:   Brief Narrative: Patient is a 79 year old Caucasian female with past medical history significant for right ankle arthroscopy and debridement for impingement and March 29, 2020, hypertension, hyperlipidemia, hypothyroidism, GERD and type 2 diabetes mellitus. Patient was admitted with submassive PE with right heart strain and left lower extremity DVT. ICU team was consulted but patient was not deemed a candidate for catheter directed thrombolysis. Patient is currently on IV heparin drip. Shortness of breath is improving. 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 Tried contacting GYN oncology over the weekend but was not successful. Currently consult GYN oncology tomorrow morning. Continue heparin for now. Further management depend on hospital course.  Echocardiogram revealed: 1. Left ventricular ejection fraction, by estimation, is 60 to 65%. The  left ventricle has normal function. The left ventricle has no regional  wall motion abnormalities. There is mild left ventricular hypertrophy.  Left ventricular diastolic parameters  are consistent with Grade I diastolic dysfunction (impaired relaxation).  There is the interventricular septum is flattened in systole and diastole,  consistent with right ventricular pressure and volume overload.  2. Right ventricular systolic function is moderately reduced. The right  ventricular size is moderately enlarged. There is mildly elevated  pulmonary artery systolic pressure.  3. The mitral valve is normal in structure. Trivial mitral valve  regurgitation.  4. The aortic valve is normal in structure. Aortic valve regurgitation is  trivial. Mild to moderate aortic valve sclerosis/calcification is present,  without any evidence of aortic stenosis.   CT angio chest PE protocol/CT  abdomen pelvis with contrast revealed: 1. Large saddle pulmonary embolus with CT evidence of right heart strain (RV/LV ratio of 1.6) consistent with at least submassive (intermediate risk) PE. The presence of right heart strain has been associated with an increased risk of morbidity and mortality. 2. Large left and small right pleural effusions. 3. Large amount of peritoneal nodularity, consistent with peritoneal carcinomatosis. 4. Large midline pelvic mass, measuring up to 12.9 x 8.7 x 9.7 cm. 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   Assessment & Plan:   Principal Problem:   Acute pulmonary embolism (HCC) Active Problems:   Acute respiratory failure with hypoxia (HCC)   Pleural effusion   AKI (acute kidney injury) (HCC)   Lactic acidosis  Acute hypoxic respiratory failure secondary to acute large saddle pulmonary embolus with CT evidence of right heart strain:  -Continue heparin drip for now.   -Echo report and CT findings as documented above.   -Possible GYN oncology consult placed.   -Further management depend on hospital course.   Concern for primary gynecologic malignancy:  -CT abdomen pelvis showing a large amount of peritoneal nodularity consistent with peritoneal carcinomatosis.  Large midline pelvic mass, measuring up to 12.9 x 8.7 x 9.7 cm concerning for a primary gynecologic malignancy. -Please consult gyn onc in a.m.  Large left pleural effusion:  -Seen on CT along with a small right pleural effusion.   -IR guided thoracentesis for left pleural effusion, pleural fluid analysis -exudative.    Mild AKI:  -Could be multifactorial.   -Resolved significantly.  .  High anion gap metabolic acidosis:  -Anion gap has resolved.   -CO2 was 18 today.   -We will need ABG if further analysis is needed.  Elevated alkaline phosphatase:  -Alk phos elevated at 201, remainder of LFTs  normal. -Mildly elevated GGT (15)  -Elevated alkaline phosphatase may be related to the CT pelvis finding.    Hyperlipidemia -Continue Zocor  Hypothyroidism -Continue Synthroid  Non-insulin-dependent type 2 diabetes -Hemoglobin was 7.1%.   -Continue to optimize blood sugar control.    DVT prophylaxis: IV heparin Code Status: Full code Family Communication:  Disposition Plan: This will depend on hospital course   Consultants:   Critical care team.  Tried consulting GYN oncology over the weekend but was not successful. Please try again tomorrow.  Procedures:   Echocardiogram   Antimicrobials:   None   Subjective: Shortness of breath is improving.  Objective: Vitals:   05/21/20 1430 05/21/20 2000 05/21/20 2024 05/22/20 0359  BP: 133/74  130/82 115/72  Pulse: 73 73 73 69  Resp: '20  20 20  ' Temp: (!) 97.4 F (36.3 C)  97.6 F (36.4 C) 98.1 F (36.7 C)  TempSrc: Oral  Oral Oral  SpO2: 99% 96% 95% 97%  Weight:    59.3 kg  Height:        Intake/Output Summary (Last 24 hours) at 05/22/2020 1018 Last data filed at 05/22/2020 0600 Gross per 24 hour  Intake 1330.33 ml  Output 425 ml  Net 905.33 ml   Filed Weights   05/21/20 0448 05/22/20 0359  Weight: 58.8 kg 59.3 kg    Examination:  General exam: Appears calm and comfortable  Respiratory system: Clear to auscultation. Respiratory effort normal. Cardiovascular system: S1 & S2 heard, RRR. No JVD, murmurs, rubs, gallops or clicks. No pedal edema. Gastrointestinal system: Abdomen is nondistended, soft and nontender. No organomegaly or masses felt. Normal bowel sounds heard. Central nervous system: Alert and oriented. No focal neurological deficits. Extremities: Symmetric 5 x 5 power. Skin: No rashes, lesions or ulcers Psychiatry: Judgement and insight appear normal. Mood & affect appropriate.     Data Reviewed: I have personally reviewed following labs and imaging studies  CBC: Recent Labs  Lab  05/20/20 1458 05/22/20 0349  WBC 9.4 8.0  NEUTROABS 7.6  --   HGB 11.6* 10.3*  HCT 38.4 33.5*  MCV 87.9 86.6  PLT 249 259   Basic Metabolic Panel: Recent Labs  Lab 05/20/20 1458 05/22/20 0349  NA 138 136  K 3.7 3.5  CL 101 105  CO2 17* 18*  GLUCOSE 187* 111*  BUN 33* 27*  CREATININE 1.45* 1.27*  CALCIUM 9.3 8.5*   GFR: Estimated Creatinine Clearance: 28.9 mL/min (A) (by C-G formula based on SCr of 1.27 mg/dL (H)). Liver Function Tests: Recent Labs  Lab 05/20/20 1458  AST 27  ALT 14  ALKPHOS 201*  BILITOT 0.8  PROT 8.2*  ALBUMIN 3.1*   No results for input(s): LIPASE, AMYLASE in the last 168 hours. No results for input(s): AMMONIA in the last 168 hours. Coagulation Profile: No results for input(s): INR, PROTIME in the last 168 hours. Cardiac Enzymes: No results for input(s): CKTOTAL, CKMB, CKMBINDEX, TROPONINI in the last 168 hours. BNP (last 3 results) No results for input(s): PROBNP in the last 8760 hours. HbA1C: Recent Labs    05/21/20 0247  HGBA1C 7.1*   CBG: Recent Labs  Lab 05/21/20 1524 05/21/20 2114 05/22/20 0022 05/22/20 0506 05/22/20 0814  GLUCAP 106* 123* 118* 116* 105*   Lipid Profile: No results for input(s): CHOL, HDL, LDLCALC, TRIG, CHOLHDL, LDLDIRECT in the last 72 hours. Thyroid Function Tests: No results for input(s): TSH, T4TOTAL, FREET4,  T3FREE, THYROIDAB in the last 72 hours. Anemia Panel: No results for input(s): VITAMINB12, FOLATE, FERRITIN, TIBC, IRON, RETICCTPCT in the last 72 hours. Urine analysis:    Component Value Date/Time   COLORURINE STRAW (A) 05/27/2017 0627   APPEARANCEUR CLEAR 05/27/2017 0627   LABSPEC 1.011 05/27/2017 0627   PHURINE 6.0 05/27/2017 0627   GLUCOSEU >=500 (A) 05/27/2017 0627   HGBUR NEGATIVE 05/27/2017 0627   BILIRUBINUR NEGATIVE 05/27/2017 0627   KETONESUR NEGATIVE 05/27/2017 0627   PROTEINUR NEGATIVE 05/27/2017 0627   UROBILINOGEN 1.0 09/25/2012 1352   NITRITE NEGATIVE 05/27/2017 0627    LEUKOCYTESUR NEGATIVE 05/27/2017 0627   Sepsis Labs: '@LABRCNTIP' (procalcitonin:4,lacticidven:4)  ) Recent Results (from the past 240 hour(s))  SARS Coronavirus 2 by RT PCR (hospital order, performed in [facility] hospital lab) Nasopharyngeal Nasopharyngeal Swab     Status: None   Collection Time: 05/20/20  8:27 PM   Specimen: Nasopharyngeal Swab  Result Value Ref Range Status   SARS Coronavirus 2 NEGATIVE NEGATIVE Final    Comment: (NOTE) SARS-CoV-2 target nucleic acids are NOT DETECTED.  The SARS-CoV-2 RNA is generally detectable in upper and lower respiratory specimens during the acute phase of infection. The lowest concentration of SARS-CoV-2 viral copies this assay can detect is 250 copies / mL. A negative result does not preclude SARS-CoV-2 infection and should not be used as the sole basis for treatment or other patient management decisions.  A negative result may occur with improper specimen collection / handling, submission of specimen other than nasopharyngeal swab, presence of viral mutation(s) within the areas targeted by this assay, and inadequate number of viral copies (<250 copies / mL). A negative result must be combined with clinical observations, patient history, and epidemiological information.  Fact Sheet for Patients:   StrictlyIdeas.no  Fact Sheet for Healthcare Providers: BankingDealers.co.za  This test is not yet approved or  cleared by the [place] FDA and has been authorized for detection and/or diagnosis of SARS-CoV-2 by FDA under an Emergency Use Authorization (EUA).  This EUA will remain in effect (meaning this test can be used) for the duration of the COVID-19 declaration under Section 564(b)(1) of the Act, 21 U.S.C. section 360bbb-3(b)(1), unless the authorization is terminated or revoked sooner.  Performed at [facility], [place] [address], [place], [place] [address]   Gram stain      Status: None   Collection Time: 05/21/20 11:18 AM   Specimen: PATH Cytology Pleural fluid  Result Value Ref Range Status   Specimen Description CYTO PLEU  Final   Special Requests NONE  Final   Gram Stain   Final    WBC PRESENT,BOTH PMN AND MONONUCLEAR NO ORGANISMS SEEN CYTOSPIN SMEAR Performed at [facility], [address], [place], [place] [address]    Report Status 05/21/2020 FINAL  Final  Culture, body fluid-bottle     Status: None (Preliminary result)   Collection Time: 05/21/20 11:18 AM   Specimen: Pleura  Result Value Ref Range Status   Specimen Description PLEURAL FLUID  Final   Special Requests NONE  Final   Culture   Final    NO GROWTH < 24 HOURS Performed at [facility], [place] [address], [place], [place] [address]    Report Status PENDING  Incomplete         Radiology Studies: DG Chest 1 View  Result Date: 05/21/2020 CLINICAL DATA:  Post thoracentesis EXAM: CHEST  1 VIEW COMPARISON:  May 20, 2020 FINDINGS: The left-sided  pleural effusion is smaller in the interval. No pneumothorax identified. The cardiomediastinal silhouette is stable. No other acute abnormalities. IMPRESSION: No pneumothorax after left thoracentesis. The left effusion is smaller. Electronically Signed   By: Dorise Bullion III M.D   On: 05/21/2020 11:53   DG Chest 2 View  Result Date: 05/20/2020 CLINICAL DATA:  80 year old female with shortness of breath. EXAM: CHEST - 2 VIEW COMPARISON:  Chest radiograph dated 12/04/2019. FINDINGS: Small left pleural effusion and left lung base atelectasis or infiltrate. Clinical correlation and follow-up to resolution recommended. The right lung is clear. No pneumothorax. Stable cardiac silhouette. Mild bilateral hilar prominence, likely pulmonary hypertension. There is osteopenia with degenerative changes of the spine. No acute osseous pathology. IMPRESSION: Small left pleural effusion and left lung base atelectasis or infiltrate. Electronically  Signed   By: Anner Crete M.D.   On: 05/20/2020 15:39   CT Angio Chest PE W and/or Wo Contrast  Result Date: 05/21/2020 CLINICAL DATA:  Abdominal pain and shortness of breath. Recent surgery. EXAM: CT ANGIOGRAPHY CHEST CT ABDOMEN AND PELVIS WITH CONTRAST TECHNIQUE: Multidetector CT imaging of the chest was performed using the standard protocol during bolus administration of intravenous contrast. Multiplanar CT image reconstructions and MIPs were obtained to evaluate the vascular anatomy. Multidetector CT imaging of the abdomen and pelvis was performed using the standard protocol during bolus administration of intravenous contrast. CONTRAST:  4m OMNIPAQUE IOHEXOL 350 MG/ML SOLN COMPARISON:  None. FINDINGS: CTA CHEST FINDINGS Cardiovascular: Contrast injection is sufficient to demonstrate satisfactory opacification of the pulmonary arteries to the segmental level.There is a saddle pulmonary embolus extending into all proximal lobar branches. There is evidence of right heart strain with an RV to LV ratio of 1.6.the visualized aorta is normal. There is a normal 3-vessel arch branching pattern. Heart size is normal, without pericardial effusion. Mediastinum/Nodes: No mediastinal, hilar or axillary lymphadenopathy. The visualized thyroid and thoracic esophageal course are unremarkable. Lungs/Pleura: Small right and large left pleural effusions. Musculoskeletal: No chest wall abnormality. No acute or significant osseous findings. Review of the MIP images confirms the above findings. CT ABDOMEN and PELVIS FINDINGS Hepatobiliary: Normal hepatic contours and density. No visible biliary dilatation. Perihepatic ascites. Normal gallbladder. Pancreas: Normal contours without ductal dilatation. No peripancreatic fluid collection. Spleen: Normal. Adrenals/Urinary Tract: --Adrenal glands: Normal. --Right kidney/ureter: No hydronephrosis or perinephric stranding. No nephrolithiasis. No obstructing ureteral stones. --Left  kidney/ureter: No hydronephrosis or perinephric stranding. No nephrolithiasis. No obstructing ureteral stones. --Urinary bladder: Unremarkable. Stomach/Bowel: --Stomach/Duodenum: Intermediate sized hiatal hernia. --Small bowel: No dilatation or inflammation. --Colon: No focal abnormality. --Appendix: Not visualized Vascular/Lymphatic: Normal course and caliber of the major abdominal vessels. No abdominal or pelvic lymphadenopathy. Reproductive: There is a heterogeneous, multilobulated midline pelvic mass that measures 12.9 x 8.7 x 9.7 cm. It is unclear whether some component of this is the uterus. The ovaries are not separately visualized. Musculoskeletal. No bony spinal canal stenosis or focal osseous abnormality. Other: There is a large amount of peritoneal nodularity, particularly in the left upper quadrant. A left anterior abdomen circumscribed fluid collection measures 6.2 x 3.9 cm. IMPRESSION: 1. Large saddle pulmonary embolus with CT evidence of right heart strain (RV/LV ratio of 1.6) consistent with at least submassive (intermediate risk) PE. The presence of right heart strain has been associated with an increased risk of morbidity and mortality. 2. Large left and small right pleural effusions. 3. Large amount of peritoneal nodularity, consistent with peritoneal carcinomatosis. 4. Large midline pelvic mass, measuring up to 12.9 x  8.7 x 9.7 cm. 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 Critical Value/emergent results were called by telephone at the time of interpretation on 05/21/2020 at 1:09 am to provider [facility], who verbally acknowledged these results. Electronically Signed   By: Ulyses Jarred M.D.   On: 05/21/2020 01:17   CT ABDOMEN PELVIS W CONTRAST  Result Date: 05/21/2020 CLINICAL DATA:  Abdominal pain and shortness of breath. Recent surgery. EXAM: CT ANGIOGRAPHY CHEST CT ABDOMEN AND PELVIS WITH CONTRAST  TECHNIQUE: Multidetector CT imaging of the chest was performed using the standard protocol during bolus administration of intravenous contrast. Multiplanar CT image reconstructions and MIPs were obtained to evaluate the vascular anatomy. Multidetector CT imaging of the abdomen and pelvis was performed using the standard protocol during bolus administration of intravenous contrast. CONTRAST:  17m OMNIPAQUE IOHEXOL 350 MG/ML SOLN COMPARISON:  None. FINDINGS: CTA CHEST FINDINGS Cardiovascular: Contrast injection is sufficient to demonstrate satisfactory opacification of the pulmonary arteries to the segmental level.There is a saddle pulmonary embolus extending into all proximal lobar branches. There is evidence of right heart strain with an RV to LV ratio of 1.6.the visualized aorta is normal. There is a normal 3-vessel arch branching pattern. Heart size is normal, without pericardial effusion. Mediastinum/Nodes: No mediastinal, hilar or axillary lymphadenopathy. The visualized thyroid and thoracic esophageal course are unremarkable. Lungs/Pleura: Small right and large left pleural effusions. Musculoskeletal: No chest wall abnormality. No acute or significant osseous findings. Review of the MIP images confirms the above findings. CT ABDOMEN and PELVIS FINDINGS Hepatobiliary: Normal hepatic contours and density. No visible biliary dilatation. Perihepatic ascites. Normal gallbladder. Pancreas: Normal contours without ductal dilatation. No peripancreatic fluid collection. Spleen: Normal. Adrenals/Urinary Tract: --Adrenal glands: Normal. --Right kidney/ureter: No hydronephrosis or perinephric stranding. No nephrolithiasis. No obstructing ureteral stones. --Left kidney/ureter: No hydronephrosis or perinephric stranding. No nephrolithiasis. No obstructing ureteral stones. --Urinary bladder: Unremarkable. Stomach/Bowel: --Stomach/Duodenum: Intermediate sized hiatal hernia. --Small bowel: No dilatation or inflammation.  --Colon: No focal abnormality. --Appendix: Not visualized Vascular/Lymphatic: Normal course and caliber of the major abdominal vessels. No abdominal or pelvic lymphadenopathy. Reproductive: There is a heterogeneous, multilobulated midline pelvic mass that measures 12.9 x 8.7 x 9.7 cm. It is unclear whether some component of this is the uterus. The ovaries are not separately visualized. Musculoskeletal. No bony spinal canal stenosis or focal osseous abnormality. Other: There is a large amount of peritoneal nodularity, particularly in the left upper quadrant. A left anterior abdomen circumscribed fluid collection measures 6.2 x 3.9 cm. IMPRESSION: 1. Large saddle pulmonary embolus with CT evidence of right heart strain (RV/LV ratio of 1.6) consistent with at least submassive (intermediate risk) PE. The presence of right heart strain has been associated with an increased risk of morbidity and mortality. 2. Large left and small right pleural effusions. 3. Large amount of peritoneal nodularity, consistent with peritoneal carcinomatosis. 4. Large midline pelvic mass, measuring up to 12.9 x 8.7 x 9.7 cm. 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 Critical Value/emergent results were called by telephone at the time of interpretation on 05/21/2020 at 1:09 am to [facility] who verbally acknowledged these results. Electronically Signed   By: KUlyses JarredM.D.   On: 05/21/2020 01:17   VAS UK[place]IVC/ILIAC (VENOUS ONLY)  Result Date: 05/21/2020 IVC/ILIAC STUDY Indications: Pulmonary embolism with pelvic mass, left saphenofemoral junction  DVT Limitations: Air/bowel gas, patient discomfort and anatomic distortion secondary to pelvic mass.  Comparison Study: No prior study Performing Technologist: Maudry Mayhew MHA, RDMS, RVT, RDCS  Examination Guidelines: A complete evaluation includes B-mode imaging, spectral Doppler,  color Doppler, and power Doppler as needed of all accessible portions of each vessel. Bilateral testing is considered an integral part of a complete examination. Limited examinations for reoccurring indications may be performed as noted.  +-------------------+---------+-----------+---------+-----------+--------+         CIV        RT-PatentRT-ThrombusLT-PatentLT-ThrombusComments +-------------------+---------+-----------+---------+-----------+--------+ Common Iliac Distal patent              patent                      +-------------------+---------+-----------+---------+-----------+--------+  +------------------------+---------+-----------+---------+-----------+--------+           EIV           RT-PatentRT-ThrombusLT-PatentLT-ThrombusComments +------------------------+---------+-----------+---------+-----------+--------+ External Iliac Vein Prox patent              patent                      +------------------------+---------+-----------+---------+-----------+--------+   Summary: IVC/Iliac: Study was limited. There is no obvious evidence of occlusive thrombus involving the right common iliac vein and left common iliac vein. There is no obvious evidence of occlusive thrombus involving the right external iliac vein and left external iliac vein. Unable to visualize IVC due to technical limitations listed above.  *See table(s) above for measurements and observations.  Electronically signed by Deitra Mayo MD on 05/21/2020 at 3:15:52 PM.   Final    ECHOCARDIOGRAM COMPLETE  Result Date: 05/21/2020    ECHOCARDIOGRAM REPORT   Patient Name:   DENAE ZULUETA Date of Exam: 05/21/2020 Medical Rec #:  [id number]           Height:       60.0 in Accession #:    [id number]          Weight:       129.6 lb Date of Birth:  [birth year]           BSA:          1.552 m Patient Age:    89 years            BP:           118/69 mmHg Patient Gender: F                   HR:           69 bpm. Exam  Location:  Inpatient Procedure: 2D Echo, Color Doppler, Cardiac Doppler and Intracardiac            Opacification Agent Indications:    Pulmonary embolus  History:        Patient has no prior history of Echocardiogram examinations.                 Signs/Symptoms:Chest Pain and Shortness of Breath; Risk                 Factors:Former Smoker, Diabetes and Hypertension. CKD.  Sonographer:    Clayton Lefort RDCS (AE) Referring Phys: [id number] [place]  1. Left ventricular ejection fraction, by estimation, is 60 to 65%. The left ventricle has normal function. The left ventricle has no regional wall motion abnormalities. There is mild left ventricular hypertrophy. Left ventricular diastolic parameters are consistent with Grade I diastolic dysfunction (impaired  relaxation). There is the interventricular septum is flattened in systole and diastole, consistent with right ventricular pressure and volume overload.  2. Right ventricular systolic function is moderately reduced. The right ventricular size is moderately enlarged. There is mildly elevated pulmonary artery systolic pressure.  3. The mitral valve is normal in structure. Trivial mitral valve regurgitation.  4. The aortic valve is normal in structure. Aortic valve regurgitation is trivial. Mild to moderate aortic valve sclerosis/calcification is present, without any evidence of aortic stenosis. FINDINGS  Left Ventricle: Left ventricular ejection fraction, by estimation, is 60 to 65%. The left ventricle has normal function. The left ventricle has no regional wall motion abnormalities. Definity contrast agent was given IV to delineate the left ventricular  endocardial borders. The left ventricular internal cavity size was normal in size. There is mild left ventricular hypertrophy. The interventricular septum is flattened in systole and diastole, consistent with right ventricular pressure and volume overload. Left ventricular diastolic parameters are  consistent with Grade I diastolic dysfunction (impaired relaxation). Right Ventricle: The right ventricular size is moderately enlarged. No increase in right ventricular wall thickness. Right ventricular systolic function is moderately reduced. There is mildly elevated pulmonary artery systolic pressure. The tricuspid regurgitant velocity is 2.92 m/s, and with an assumed right atrial pressure of 10 mmHg, the estimated right ventricular systolic pressure is 15.1 mmHg. Left Atrium: Left atrial size was normal in size. Right Atrium: Right atrial size was not assessed. Pericardium: There is no evidence of pericardial effusion. Mitral Valve: The mitral valve is normal in structure. Trivial mitral valve regurgitation. Tricuspid Valve: The tricuspid valve is normal in structure. Tricuspid valve regurgitation is mild. Aortic Valve: The aortic valve is normal in structure.. There is mild thickening of the aortic valve. Aortic valve regurgitation is trivial. Mild to moderate aortic valve sclerosis/calcification is present, without any evidence of aortic stenosis. There is mild thickening of the aortic valve. Aortic valve mean gradient measures 3.0 mmHg. Aortic valve peak gradient measures 6.1 mmHg. Aortic valve area, by VTI measures 1.84 cm. Pulmonic Valve: The pulmonic valve was grossly normal. Pulmonic valve regurgitation is mild. Aorta: The aortic root and ascending aorta are structurally normal, with no evidence of dilitation. IAS/Shunts: No atrial level shunt detected by color flow Doppler.  LEFT VENTRICLE PLAX 2D LVIDd:         2.70 cm  Diastology LVIDs:         2.00 cm  LV e' lateral:   6.09 cm/s LV PW:         1.30 cm  LV E/e' lateral: 8.0 LV IVS:        1.20 cm  LV e' medial:    4.46 cm/s LVOT diam:     1.65 cm  LV E/e' medial:  10.9 LV SV:         45 LV SV Index:   29 LVOT Area:     2.14 cm  RIGHT VENTRICLE             IVC RV Basal diam:  2.70 cm     IVC diam: 1.60 cm RV S prime:     10.40 cm/s TAPSE (M-mode): 1.8  cm LEFT ATRIUM           Index       RIGHT ATRIUM           Index LA diam:      2.60 cm 1.67 cm/m  RA Area:     13.40 cm LA Vol (A4C):  28.1 ml 18.10 ml/m RA Volume:   35.20 ml  22.67 ml/m  AORTIC VALVE AV Area (Vmax):    1.93 cm AV Area (Vmean):   1.74 cm AV Area (VTI):     1.84 cm AV Vmax:           123.00 cm/s AV Vmean:          79.700 cm/s AV VTI:            0.243 m AV Peak Grad:      6.1 mmHg AV Mean Grad:      3.0 mmHg LVOT Vmax:         111.00 cm/s LVOT Vmean:        64.800 cm/s LVOT VTI:          0.209 m LVOT/AV VTI ratio: 0.86  AORTA Ao Root diam: 3.00 cm Ao Asc diam:  3.10 cm MITRAL VALVE               TRICUSPID VALVE MV Area (PHT): 2.62 cm    TR Peak grad:   34.1 mmHg MV Decel Time: 289 msec    TR Vmax:        292.00 cm/s MV E velocity: 48.80 cm/s MV A velocity: 84.00 cm/s  SHUNTS MV E/A ratio:  0.58        Systemic VTI:  0.21 m                            Systemic Diam: 1.65 cm Glori Bickers MD Electronically signed by Glori Bickers MD Signature Date/Time: 05/21/2020/2:27:06 PM    Final    VAS [place] LOWER EXTREMITY VENOUS (DVT)  Result Date: 05/21/2020  Lower Venous DVTStudy Indications: Pulmonary embolism, and pelvic mass.  Limitations: Poor ultrasound/tissue interface, body habitus and patient position. Comparison Study: No prior study Performing Technologist: Maudry Mayhew MHA, RDMS, RVT, RDCS  Examination Guidelines: A complete evaluation includes B-mode imaging, spectral Doppler, color Doppler, and power Doppler as needed of all accessible portions of each vessel. Bilateral testing is considered an integral part of a complete examination. Limited examinations for reoccurring indications may be performed as noted. The reflux portion of the exam is performed with the patient in reverse Trendelenburg.  +---------+---------------+---------+-----------+----------+--------------+ RIGHT    CompressibilityPhasicitySpontaneityPropertiesThrombus Aging  +---------+---------------+---------+-----------+----------+--------------+ CFV      Full           Yes      Yes                                 +---------+---------------+---------+-----------+----------+--------------+ SFJ      Full                                                        +---------+---------------+---------+-----------+----------+--------------+ FV Prox  Full                                                        +---------+---------------+---------+-----------+----------+--------------+ FV Mid   Full                                                        +---------+---------------+---------+-----------+----------+--------------+  FV DistalFull                                                        +---------+---------------+---------+-----------+----------+--------------+ PFV      Full                                                        +---------+---------------+---------+-----------+----------+--------------+ POP      Full           Yes      Yes                                 +---------+---------------+---------+-----------+----------+--------------+ PTV      Full                                                        +---------+---------------+---------+-----------+----------+--------------+ PERO     Full                                                        +---------+---------------+---------+-----------+----------+--------------+   +-------+---------------+---------+-----------+----------+--------------+ LEFT   CompressibilityPhasicitySpontaneityPropertiesThrombus Aging +-------+---------------+---------+-----------+----------+--------------+ CFV    Full           No       Yes                                 +-------+---------------+---------+-----------+----------+--------------+ SFJ    None                                         Acute           +-------+---------------+---------+-----------+----------+--------------+ FV ProxFull                                                        +-------+---------------+---------+-----------+----------+--------------+ FV Mid Full                                                        +-------+---------------+---------+-----------+----------+--------------+ PTV    None                    No                   Acute          +-------+---------------+---------+-----------+----------+--------------+ PERO   Full                                                        +-------+---------------+---------+-----------+----------+--------------+  GSV    None                                         Acute at [facility]   +-------+---------------+---------+-----------+----------+--------------+   Left Technical Findings: Not visualized segments include PFV, distal FV, popliteal vein.   Summary: RIGHT: - There is no evidence of deep vein thrombosis in the lower extremity.  - No cystic structure found in the popliteal fossa.  LEFT: - Findings consistent with acute deep vein thrombosis involving the SF junction, and left posterior tibial veins. - No cystic structure found in the popliteal fossa.  *See table(s) above for measurements and observations. Electronically signed by Deitra Mayo MD on 05/21/2020 at 3:16:04 PM.    Final    US THORACENTESIS ASP PLEURAL SPACE W/IMG GUIDE  Result Date: 05/21/2020 INDICATION: Patient with history of dyspnea, large saddle pulmonary embolism, bilateral pleural effusions left greater than right, peritoneal nodularity, pelvic mass; request received for diagnostic and therapeutic left thoracentesis. EXAM: ULTRASOUND GUIDED DIAGNOSTIC AND THERAPEUTIC LEFT THORACENTESIS MEDICATIONS: 1% lidocaine to skin and subcutaneous tissue COMPLICATIONS: None immediate. PROCEDURE: An ultrasound guided thoracentesis was thoroughly discussed with the patient and questions answered. The  benefits, risks, alternatives and complications were also discussed. The patient understands and wishes to proceed with the procedure. Written consent was obtained. Ultrasound was performed to localize and mark an adequate pocket of fluid in the left chest. The area was then prepped and draped in the normal sterile fashion. 1% Lidocaine was used for local anesthesia. Under ultrasound guidance a 6 Fr Safe-T-Centesis catheter was introduced. Thoracentesis was performed. The catheter was removed and a dressing applied. FINDINGS: A total of approximately 580 cc of slightly hazy, amber fluid was removed. Samples were sent to the laboratory as requested by the clinical team. IMPRESSION: Successful ultrasound guided diagnostic and therapeutic left thoracentesis yielding 580 cc of pleural fluid. Read by: Rowe Robert, PA-C Electronically Signed   By: Jacqulynn Cadet M.D.   On: 05/21/2020 11:05        Scheduled Meds: . insulin aspart  0-9 Units Subcutaneous Q4H  . levothyroxine  75 mcg Oral Daily  . mouth rinse  15 mL Mouth Rinse BID  . pantoprazole  20 mg Oral Daily  . potassium chloride  40 mEq Oral Once  . sertraline  50 mg Oral Daily  . simvastatin  40 mg Oral q1800   Continuous Infusions: . heparin 950 Units/hr (05/22/20 0600)     LOS: 1 day    Time spent: 35 minutes    Dana Allan, MD  [facility] Pager #: [phone] 7PM-7AM contact night coverage as above

## 2020-05-22 NOTE — Plan of Care (Signed)
  Problem: Education: Goal: Knowledge of General Education information will improve Description: Including pain rating scale, medication(s)/side effects and non-pharmacologic comfort measures Outcome: Progressing   Problem: Clinical Measurements: Goal: Ability to maintain clinical measurements within normal limits will improve Outcome: Progressing Goal: Will remain free from infection Outcome: Progressing   

## 2020-05-22 NOTE — Progress Notes (Signed)
Brief Progress Note  Pt. Remains hemodynamically stable on 2 L [place] with oxygen saturations of 97% , BP of 118/67, and HR of 70. She does endorse some exertional dyspnea. Noted positive L acute deep vein thrombosis involving the SF  junction, and left posterior tibial veins Discussed case with Dr. June Leap and as patient is stable and oxygenating well,  anticoagulation with heparin is within the targeted range,  PCCM will sign off at this time. Please do not hesitate to call if patient decompensates, or if you need further Critical Care support or recommendations.  Magdalen Spatz, MSN, AGACNP-BC [place] for personal pager PCCM on call pager [phone] 05/22/2020 4:29 PM

## 2020-05-22 NOTE — Progress Notes (Signed)
ANTICOAGULATION CONSULT NOTE  Pharmacy Consult for Heparin Indication: pulmonary embolus  Patient Measurements: Height: 5' (152.4 cm) Weight: 59.3 kg (130 lb 11.2 oz) IBW/kg (Calculated) : 45.5  Heparin dosing weight: 57.5 kg  Vital Signs: Temp: 98.1 F (36.7 C) (08/22 0359) Temp Source: Oral (08/22 0359) BP: 115/72 (08/22 0359) Pulse Rate: 69 (08/22 0359)  Labs: Recent Labs    05/20/20 1458 05/20/20 1717 05/21/20 0247 05/21/20 1132 05/21/20 1458 05/21/20 2048 05/22/20 0349  HGB 11.6*  --   --   --   --   --  10.3*  HCT 38.4  --   --   --   --   --  33.5*  PLT 249  --   --   --   --   --  239  HEPARINUNFRC  --   --   --  0.91*  --  0.66 0.65  CREATININE 1.45*  --   --   --   --   --  1.27*  TROPONINIHS 61*   < > 73* 36* 40*  --   --    < > = values in this interval not displayed.    Estimated Creatinine Clearance: 28.9 mL/min (A) (by C-G formula based on SCr of 1.27 mg/dL (H)).  Assessment: 79 y.o. F presents with SOB. CT shows Large saddle pulmonary embolus with CT evidence of right heart strain (RV/LV ratio of 1.6) consistent with at least submassive (intermediate risk) PE. Pharmacy consulted for heparin. CBC ok on admission. No AC PTA.  Heparin level is therapeutic now x2 (0.65 this morning). No bleeding noted.  Goal of Therapy:  Heparin level 0.3-0.7 units/ml Monitor platelets by anticoagulation protocol: Yes   Plan:  Continue heparin at 950 units/hr Daily heparin level and CBC Monitor for s/sx bleeding   Rebbeca Paul, PharmD PGY1 Pharmacy Resident 05/22/2020 8:45 AM  Please check [email or web address] for unit-specific pharmacy phone numbers.

## 2020-05-23 LAB — RENAL FUNCTION PANEL
Albumin: 2.9 g/dL — ABNORMAL LOW (ref 3.5–5.0)
Anion gap: 12 (ref 5–15)
BUN: 21 mg/dL (ref 8–23)
CO2: 19 mmol/L — ABNORMAL LOW (ref 22–32)
Calcium: 8.6 mg/dL — ABNORMAL LOW (ref 8.9–10.3)
Chloride: 102 mmol/L (ref 98–111)
Creatinine, Ser: 1.2 mg/dL — ABNORMAL HIGH (ref 0.44–1.00)
GFR calc Af Amer: 50 mL/min — ABNORMAL LOW (ref >60)
GFR calc non Af Amer: 43 mL/min — ABNORMAL LOW (ref >60)
Glucose, Bld: 114 mg/dL — ABNORMAL HIGH (ref 70–99)
Phosphorus: 3.1 mg/dL (ref 2.5–4.6)
Potassium: 3.7 mmol/L (ref 3.5–5.1)
Sodium: 133 mmol/L — ABNORMAL LOW (ref 135–145)

## 2020-05-23 LAB — CBC
HCT: 36.6 % (ref 36.0–46.0)
Hemoglobin: 11.1 g/dL — ABNORMAL LOW (ref 12.0–15.0)
MCH: 26.4 pg (ref 26.0–34.0)
MCHC: 30.3 g/dL (ref 30.0–36.0)
MCV: 87.1 fL (ref 80.0–100.0)
Platelets: 262 10*3/uL (ref 150–400)
RBC: 4.2 MIL/uL (ref 3.87–5.11)
RDW: 15.5 % (ref 11.5–15.5)
WBC: 7.2 10*3/uL (ref 4.0–10.5)
nRBC: 0 % (ref 0.0–0.2)

## 2020-05-23 LAB — C DIFFICILE QUICK SCREEN W PCR REFLEX
C Diff antigen: NEGATIVE
C Diff interpretation: NOT DETECTED
C Diff toxin: NEGATIVE

## 2020-05-23 LAB — GLUCOSE, CAPILLARY
Glucose-Capillary: 98 mg/dL (ref 70–99)
Glucose-Capillary: 111 mg/dL — ABNORMAL HIGH (ref 70–99)
Glucose-Capillary: 118 mg/dL — ABNORMAL HIGH (ref 70–99)
Glucose-Capillary: 156 mg/dL — ABNORMAL HIGH (ref 70–99)
Glucose-Capillary: 133 mg/dL — ABNORMAL HIGH (ref 70–99)

## 2020-05-23 LAB — CYTOLOGY - NON PAP

## 2020-05-23 LAB — PH, BODY FLUID: pH, Body Fluid: 7.7

## 2020-05-23 LAB — HEPARIN LEVEL (UNFRACTIONATED): Heparin Unfractionated: 0.72 IU/mL — ABNORMAL HIGH (ref 0.30–0.70)

## 2020-05-23 MED ORDER — INSULIN ASPART 100 UNIT/ML ~~LOC~~ SOLN
0.0000 [IU] | Freq: Three times a day (TID) | SUBCUTANEOUS | Status: DC
  Administered 2020-05-25: 1 [IU] via SUBCUTANEOUS

## 2020-05-23 NOTE — TOC Progression Note (Signed)
Transition of Care [facility]) - Progression Note    Patient Details  Name: BERNITA BECKSTROM MRN: [id number] Date of Birth: 28-Mar-1941  Transition of Care [facility]) CM/SW [place], [place] Phone Number: 05/23/2020, 5:14 PM  Clinical Narrative:     CSW will need to start insurance authorization when patient getting closer to being medically stable. PASSR number is pending. Pending SNF bed offer with [facility].  CSW will continue to follow.   Expected Discharge Plan: [place] Barriers to Discharge: Continued Medical Work up  Expected Discharge Plan and Services Expected Discharge Plan: [place] arrangements for the past 2 months: Apartment                                       Social Determinants of Health (SDOH) Interventions    Readmission Risk Interventions No flowsheet data found.

## 2020-05-23 NOTE — Evaluation (Signed)
Physical Therapy Evaluation Patient Details Name: Amber Gray MRN: [id number] DOB: [birth year] Today's Date: 05/23/2020   History of Present Illness  Patient is a 79 y.o. F presenting to the hospital for a R pulmonary embolism, pleural effusions, peritoneal carcinomatosis, and a L LE DVT on Heparin. R ankle debridement and impingement surgery (03/29/20) WBAT. PMH of HTN and DNII.    Clinical Impression  Patient was received laying in hospital bed on 2 O2 with Amber Gray. She was able to answer all questions about home set-up and PLOF without difficulty. Required supervision with supine to sit transfer. Demonstrated bilateral UE and LE weakness upon strength testing. Provided min guard assistance for sit to stand transfer and gait. No verbal or tactile cueing required with mobility. She walked approximately 10' w/ RW to her recliner chair. She was SOB with walking, however O2 sat remained at 98% on 2 LPM. She was left with all needs within reach. Patient would continue to benefit from skilled physical therapy services to address deficits in strength, mobility, gait training, and cardiopulmonary endurance. Recommend f/u physical therapy services at SNF after d/c.    Follow Up Recommendations SNF;Supervision - Intermittent    Equipment Recommendations  Rolling walker with 5" wheels;3in1 (PT);Other (comment) (defer to next venue)    Recommendations for Other Services       Precautions / Restrictions Precautions Precautions: Fall Restrictions Weight Bearing Restrictions: Yes RLE Weight Bearing: Weight bearing as tolerated Other Position/Activity Restrictions: R LE WBAT s/p ankle impingement and debridement surgery 03/29/20.      Mobility  Bed Mobility Overal bed mobility: Needs Assistance Bed Mobility: Supine to Sit     Supine to sit: Supervision     General bed mobility comments: slow to get up, but did not require physical assistance or cueing. supervision for  safety  Transfers Overall transfer level: Needs assistance Equipment used: Rolling walker (2 wheeled) Transfers: Sit to/from Stand Sit to Stand: Min guard         General transfer comment: did not require physical assistance or cueing to stand.  Ambulation/Gait Ambulation/Gait assistance: Min guard Gait Distance (Feet): 10 Feet Assistive device: Rolling walker (2 wheeled) Gait Pattern/deviations: Step-through pattern;Decreased stride length;Narrow base of support;Trunk flexed Gait velocity: slow   General Gait Details: did not require physical assistance with ambulation or steering of the RW. SOB after gait training. maintained O2 sat on 2LPM  Stairs            Wheelchair Mobility    Modified Rankin (Stroke Patients Only)       Balance Overall balance assessment: Needs assistance Sitting-balance support: No upper extremity supported;Feet supported Sitting balance-Leahy Scale: Good     Standing balance support: Bilateral upper extremity supported;During functional activity Standing balance-Leahy Scale: Fair                               Pertinent Vitals/Pain Pain Assessment: Faces Faces Pain Scale: Hurts a little bit Pain Location: R ankle Pain Descriptors / Indicators: Discomfort Pain Intervention(s): Monitored during session;Limited activity within patient's tolerance    Home Living Family/patient expects to be discharged to:: Private residence Living Arrangements: Alone Available Help at Discharge: Family (daughter) Type of Home: Apartment Home Access: Level entry     Home Layout: One level Home Equipment: Bedside commode;Walker - 4 wheels;Walker - standard;Cane - single point      Prior Function Level of Independence: Independent with assistive device(s)  Comments: uses rollator, needs a lot of help after surgery     Hand Dominance        Extremity/Trunk Assessment   Upper Extremity Assessment Upper Extremity  Assessment: Defer to OT evaluation    Lower Extremity Assessment Lower Extremity Assessment: Generalized weakness (global 3/5 MMT)    Cervical / Trunk Assessment Cervical / Trunk Assessment: Normal  Communication   Communication: No difficulties  Cognition Arousal/Alertness: Awake/alert Behavior During Therapy: WFL for tasks assessed/performed Overall Cognitive Status: No family/caregiver present to determine baseline cognitive functioning                                        General Comments      Exercises     Assessment/Plan    PT Assessment Patient needs continued PT services  PT Problem List Decreased strength;Cardiopulmonary status limiting activity;Decreased activity tolerance;Decreased balance;Decreased mobility       PT Treatment Interventions DME instruction;Therapeutic exercise;Gait training;Balance training;Functional mobility training;Therapeutic activities;Patient/family education    PT Goals (Current goals can be found in the Care Plan section)  Acute Rehab PT Goals Patient Stated Goal: to go home PT Goal Formulation: With patient Time For Goal Achievement: 06/06/20 Potential to Achieve Goals: Good    Frequency Min 2X/week   Barriers to discharge        Co-evaluation               AM-PAC PT "6 Clicks" Mobility  Outcome Measure Help needed turning from your back to your side while in a flat bed without using bedrails?: A Little Help needed moving from lying on your back to sitting on the side of a flat bed without using bedrails?: A Little Help needed moving to and from a bed to a chair (including a wheelchair)?: A Little Help needed standing up from a chair using your arms (e.g., wheelchair or bedside chair)?: A Little Help needed to walk in hospital room?: A Little Help needed climbing 3-5 steps with a railing? : A Lot 6 Click Score: 17    End of Session Equipment Utilized During Treatment: Gait belt Activity Tolerance:  Patient tolerated treatment well Patient left: in chair;with call bell/phone within reach Nurse Communication: Mobility status PT Visit Diagnosis: Unsteadiness on feet (R26.81);Other abnormalities of gait and mobility (R26.89);Muscle weakness (generalized) (M62.81)    Time:  -      Charges:         Amber Gray, SPT, ATC

## 2020-05-23 NOTE — TOC Initial Note (Signed)
Transition of Care [facility]) - Initial/Assessment Note    Patient Details  Name: Amber Gray MRN: [id number] Date of Birth: 10-08-1940  Transition of Care [facility]) CM/SW Contact:    Trula Ore, [place] Phone Number: 05/23/2020, 4:23 PM  Clinical Narrative:                  CSW spoke with patient and patients daughter at bedside. Patient is agreeable to SNF placement. Patient request CSW to fax out initial referral to [facility]. Patient says she lives in an apartment at [facility]. Patient has not had her covid vaccines. CSW asked patient if she would like to receive her covid vaccine. Patient says she plans on getting the vaccine by her PCP.  CSW will fax out initial referral to [facility]. CSW will start insurance authorization for patient.   CSW will continue to follow.  Expected Discharge Plan: Skilled Nursing Facility Barriers to Discharge: Continued Medical Work up   Patient Goals and CMS Choice Patient states their goals for this hospitalization and ongoing recovery are:: to SNF CMS Medicare.gov Compare Post Acute Care list provided to:: Patient Choice offered to / list presented to : Patient  Expected Discharge Plan and Services Expected Discharge Plan: [place]       Living arrangements for the past 2 months: Apartment                                      Prior Living Arrangements/Services Living arrangements for the past 2 months: Apartment Lives with:: Self Patient language and need for interpreter reviewed:: Yes Do you feel safe going back to the place where you live?: No   SNF  Need for Family Participation in Patient Care: Yes (Comment) Care giver support system in place?: Yes (comment)   Criminal Activity/Legal Involvement Pertinent to Current Situation/Hospitalization: No - Comment as needed  Activities of Daily Living Home Assistive Devices/Equipment: Walker (specify type), Eyeglasses, CBG Meter ADL  Screening (condition at time of admission) Patient's cognitive ability adequate to safely complete daily activities?: Yes Is the patient deaf or have difficulty hearing?: No Does the patient have difficulty seeing, even when wearing glasses/contacts?: Yes Does the patient have difficulty concentrating, remembering, or making decisions?: No Patient able to express need for assistance with ADLs?: Yes Does the patient have difficulty dressing or bathing?: Yes Independently performs ADLs?: Yes (appropriate for developmental age) Does the patient have difficulty walking or climbing stairs?: Yes Weakness of Legs: Both Weakness of Arms/Hands: None  Permission Sought/Granted Permission sought to share information with : Case Manager, Family Supports, Investment banker, corporate granted to share info w AGENCY: SNF        Emotional Assessment Appearance:: Appears stated age Attitude/Demeanor/Rapport: Gracious Affect (typically observed): Calm Orientation: : Oriented to Self, Oriented to Place, Oriented to  Time, Oriented to Situation Alcohol / Substance Use: Not Applicable Psych Involvement: No (comment)  Admission diagnosis:  Pleural effusion [J90] Acute pulmonary embolism (HCC) [I26.99] Acute saddle pulmonary embolism with acute cor pulmonale (HCC) [I26.02] Abdominal mass, unspecified abdominal location [R19.00] Patient Active Problem List   Diagnosis Date Noted  . Acute pulmonary embolism ([place]) 05/21/2020  . Acute respiratory failure with hypoxia ([place]) 05/21/2020  . Pleural effusion 05/21/2020  . AKI (acute kidney injury) ([place]) 05/21/2020  . Lactic acidosis 05/21/2020  . Impingement of right ankle joint   .  Achilles tendinitis, right leg 12/01/2018  . Synovitis of knee 02/05/2018  . 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 Sepsis due to pneumonia ([place]) 05/27/2017  . CKD (chronic kidney disease), stage III 05/27/2017  . Diabetes mellitus type 2,  uncontrolled ([place]) 05/27/2017  . Hypothyroid 05/27/2017  . Hypercholesteremia 05/27/2017  . Acute hypokalemia 05/27/2017  . HTN (hypertension) 05/27/2017  . Community acquired pneumonia of left lower lobe of lung   . Diabetes mellitus with complication ([place])   . Type 2 diabetes mellitus ([place]) 06/27/2015  . Hypertension 06/27/2015  . Hypothyroidism 06/27/2015  . Chest pain 06/27/2015  . Precordial chest pain    PCP:  [name], MD Pharmacy:   [id number] - [place], [place] [address] [place] [address] Phone: [phone] Fax: [phone]     Social Determinants of Health (SDOH) Interventions    Readmission Risk Interventions No flowsheet data found.

## 2020-05-23 NOTE — Progress Notes (Signed)
Interventional Radiology Brief Note:  IR consulted for omental/pelvic mass biopsy. Patient recently had a diagnostic and therapeutic paracentesis. Will call lab to see if cytology was collected and diagnostic of malignancy prior to proceeding with review of biopsy especially given patient is on therapeutic doses of heparin due to acute saddle PE.   Brynda Greathouse, MS RD PA-C 2:00 PM

## 2020-05-23 NOTE — Progress Notes (Signed)
[facility] for Heparin Indication: pulmonary embolus  Allergies  Allergen Reactions  . Metformin And Related Other (See Comments)    Patient Measurements: Height: 5' (152.4 cm) Weight: 61.7 kg (136 lb 1.6 oz) IBW/kg (Calculated) : 45.5  Heparin dosing weight: 57.5 kg  Vital Signs: Temp: 97.3 F (36.3 C) (08/23 0750) Temp Source: Oral (08/23 0750) BP: 132/73 (08/23 0750) Pulse Rate: 69 (08/23 0750)  Labs: Recent Labs    05/20/20 1458 05/20/20 1458 05/20/20 1717 05/21/20 0247 05/21/20 1132 05/21/20 1132 05/21/20 1458 05/21/20 2048 05/22/20 0349 05/23/20 0722  HGB 11.6*   < >  --   --   --   --   --   --  10.3* 11.1*  HCT 38.4  --   --   --   --   --   --   --  33.5* 36.6  PLT 249  --   --   --   --   --   --   --  239 262  HEPARINUNFRC  --   --   --   --  0.91*   < >  --  0.66 0.65 0.72*  CREATININE 1.45*  --   --   --   --   --   --   --  1.27* 1.20*  TROPONINIHS 61*  --    < > 73* 36*  --  40*  --   --   --    < > = values in this interval not displayed.    Estimated Creatinine Clearance: 31.2 mL/min (A) (by C-G formula based on SCr of 1.2 mg/dL (H)).   Medical History: Past Medical History:  Diagnosis Date  . Anxiety   . Arthritis    "all over my body" (05/27/2017)  . Chronic lower back pain   . Depression   . Dyspnea    allergy related SOB  . Frequent UTI   . GERD (gastroesophageal reflux disease)   . Headache    "monthly" (05/27/2017)  . History of blood transfusion    "when I had my neck fusion"  . History of hiatal hernia   . Hypercholesteremia   . Hypertension   . Hypothyroid   . Pneumonia 2000s X 1; 05/27/2017  . Seasonal allergies   . Type II diabetes mellitus (HCC)    Assessment: 79 y.o. F presents with SOB. CT shows Large saddle pulmonary embolus with CT evidence of right heart strain (RV/LV ratio of 1.6) consistent with at least submassive (intermediate risk) PE. Pharmacy consulted for heparin. CBC  ok on admission. No AC PTA.  Heparin level is now at goal at 0.72 at drip rate 950 units/hr. No bleeding noted. Hemoglobin stable at 10-11. Plt stable in the 200s.  Oncology work-up being done - will continue heparin for now. Will f/u for when oral anticoagulation can be started.  Goal of Therapy:  Heparin level 0.3-0.7 units/ml Monitor platelets by anticoagulation protocol: Yes   Plan:  Continue heparin at 950 units/hr Daily heparin level and CBC Monitor for s/sx bleeding   Corky Crafts PharmD Candidate  Please check [email or web address] for unit-specific pharmacy phone numbers.

## 2020-05-23 NOTE — Progress Notes (Signed)
RE: Amber Gray. Nicole Kindred  Date of Birth: 1941/05/12  Date: 05/23/2020  To Whom It May Concern:  Please be advised that the above-named patient will require a short-term nursing home stay - anticipated 30 days or less for rehabilitation and strengthening. The plan is for return home.

## 2020-05-23 NOTE — NC FL2 (Signed)
[place] LEVEL OF CARE SCREENING TOOL     IDENTIFICATION  Patient Name: Amber Gray Birthdate: Sep 03, 1941 Sex: female Admission Date (Current Location): 05/20/2020  [facility] and [place] Number:  Herbalist and Address:  [facility], [place] [address], [place], [place] [address]      Provider Number: [id number]  Attending Physician Name and Address:  Guilford Shi, MD  Relative Name and Phone Number:  Blanch Media  [phone]    Current Level of Care: Hospital Recommended Level of Care: [place] Prior Approval Number:    Date Approved/Denied:   PASRR Number: PASRR under review  Discharge Plan: SNF    Current Diagnoses: Patient Active Problem List   Diagnosis Date Noted  . Acute pulmonary embolism ([place]) 05/21/2020  . Acute respiratory failure with hypoxia ([place]) 05/21/2020  . Pleural effusion 05/21/2020  . AKI (acute kidney injury) ([place]) 05/21/2020  . Lactic acidosis 05/21/2020  . Impingement of right ankle joint   . Achilles tendinitis, right leg 12/01/2018  . Synovitis of knee 02/05/2018  . 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 Sepsis due to pneumonia ([place]) 05/27/2017  . CKD (chronic kidney disease), stage III 05/27/2017  . Diabetes mellitus type 2, uncontrolled ([place]) 05/27/2017  . Hypothyroid 05/27/2017  . Hypercholesteremia 05/27/2017  . Acute hypokalemia 05/27/2017  . HTN (hypertension) 05/27/2017  . Community acquired pneumonia of left lower lobe of lung   . Diabetes mellitus with complication ([place])   . Type 2 diabetes mellitus ([place]) 06/27/2015  . Hypertension 06/27/2015  . Hypothyroidism 06/27/2015  . Chest pain 06/27/2015  . Precordial chest pain     Orientation RESPIRATION BLADDER Height & Weight     Self, Time, Situation, Place  O2 (Nasal Cannula 2 liters) Continent Weight: 136 lb 1.6 oz (61.7 kg) Height:  5' (152.4 cm)  BEHAVIORAL SYMPTOMS/MOOD NEUROLOGICAL BOWEL  NUTRITION STATUS      Continent (Type 7 liquid consistency with no solid pieces) Diet (See Discharge Summary)  AMBULATORY STATUS COMMUNICATION OF NEEDS Skin   Limited Assist Verbally Normal                       Personal Care Assistance Level of Assistance  Bathing, Feeding, Dressing Bathing Assistance: Limited assistance Feeding assistance: Independent (able to feed self;Cardiac Carb modified) Dressing Assistance: Limited assistance     Functional Limitations Info  Sight, Hearing, Speech Sight Info: Impaired Hearing Info: Adequate Speech Info: Adequate    SPECIAL CARE FACTORS FREQUENCY  PT (By licensed PT), OT (By licensed OT)     PT Frequency: 5x min weekly OT Frequency: 5x min weekly            Contractures Contractures Info: Not present    Additional Factors Info  Code Status, Allergies, Isolation Precautions, Insulin Sliding Scale, Psychotropic Code Status Info: FULL Allergies Info: Metformin And Related Psychotropic Info: sertraline (ZOLOFT) tablet 50 mg Daily Insulin Sliding Scale Info: insulin aspart (novoLOG) injection 0-9 Units Every 4 hours, Isolation Precautions Info: Enteric Precautions     Current Medications (05/23/2020):  This is the current hospital active medication list Current Facility-Administered Medications  Medication Dose Route Frequency Provider Last Rate Last Admin  . acetaminophen (TYLENOL) tablet 650 mg  650 mg Oral Q6H PRN Shela Leff, MD       Or  . acetaminophen (TYLENOL) suppository 650 mg  650 mg Rectal Q6H PRN Shela Leff, MD      .  albuterol (PROVENTIL) (2.5 MG/3ML) 0.083% nebulizer solution 2.5 mg  2.5 mg Inhalation Q6H PRN Shela Leff, MD      . heparin ADULT infusion 100 units/mL (25000 units/232mL sodium chloride 0.45%)  950 Units/hr Intravenous Continuous Rebbeca Paul B, RPH 9.5 mL/hr at 05/23/20 0508 950 Units/hr at 05/23/20 0508  . insulin aspart (novoLOG) injection 0-9 Units  0-9 Units Subcutaneous  Q4H Shela Leff, MD   1 Units at 05/22/20 2055  . levothyroxine (SYNTHROID) tablet 75 mcg  75 mcg Oral Daily Shela Leff, MD   75 mcg at 05/23/20 0604  . loratadine (CLARITIN) tablet 10 mg  10 mg Oral Daily PRN Shela Leff, MD      . MEDLINE mouth rinse  15 mL Mouth Rinse BID Shela Leff, MD   15 mL at 05/23/20 0828  . pantoprazole (PROTONIX) EC tablet 20 mg  20 mg Oral Daily Shela Leff, MD   20 mg at 05/23/20 0827  . sertraline (ZOLOFT) tablet 50 mg  50 mg Oral Daily Shela Leff, MD   50 mg at 05/23/20 0827  . simvastatin (ZOCOR) tablet 40 mg  40 mg Oral q1800 Shela Leff, MD   40 mg at 05/22/20 1827     Discharge Medications: Please see discharge summary for a list of discharge medications.  Relevant Imaging Results:  Relevant Lab Results:   Additional Information [id number]  Trula Ore, LCSWA

## 2020-05-23 NOTE — Consult Note (Signed)
Gynecologic Oncology Consultation  Amber Gray 79 y.o. female  CC:  Chief Complaint  Patient presents with  . Shortness of Breath    HPI: Amber Gray is a 79 year old female currently admitted for acute pulmonary embolism, acute respiratory failure, AKI, lactic acidosis with concern for metastatic disease of possible gyn origin. She presented to the ED on May 20, 2020 in the evening with complaints of progressive abdominal distention, bowel changes (constipation), weight loss (greater than 15 pounds over several months), dyspnea, and pain that had been present for the past several months.   She underwent a CT angio and CT AP on 05/21/2020 showing: large saddle pulmonary embolus with CT evidence of right heart strain (RV/LV ratio of 1.6) consistent with at least submassive (intermediate risk) PE. The presence of right heart strain has been associated with an increased risk of morbidity and mortality. 2. Large left and small right pleural effusions. 3. Large amount of peritoneal nodularity, consistent with peritoneal carcinomatosis. 4. Large midline pelvic mass, measuring up to 12.9 x 8.7 x 9.7 cm. 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 She underwent a thoracentesis on 05/21/2020 as well with 580 cc of pleural fluid removed with cytology pending.    She was also noted to have a DVT in the left lower extremity on doppler from 05/21/2020 and is currently on a heparin drip. Her past gynecologic history includes a partial hysterectomy in 1968 (unsure of the reason why) and a dilation and curettage of the uterus prior to her hysterectomy after delivery of her daughter. No other abdominal surgeries reported. She went through menopause at age 32. No vaginal bleeding since her hysterectomy. She has one daughter and two grandsons. She denies a family history of cancer. She reports several months of constipation and  diarrhea prior to orthopedic surgery. She also experienced issues with stomach swelling and was told she was constipated even with diarrhea. No nausea but experienced coughing then would have episodes of emesis. Her last colonoscopy was in 2018 with [name] and she had 2 polyps removed. Her appetite has decreased as well with early satiety for the past several months. She had significant fatigue and states getting up to the bathroom would wipe her out. The last time she saw her PCP, she weighed 152 lb before surgery and now in 130s. She also feels her problems started after surgery with coughing up phlegm and throwing up since then.   She has a primary care provider and a gastroenterologist but denies seeing a cardiologist and pulmonologist. She is a past smoker (one pack a day) and quit when her daughter was born. She lives alone in assisted living.  Interval History: She feels when she eats, it gets stuck in her throat. Passing no flatus and last BM today. Belching frequently. No pain in bilateral LE but reports swelling.  Current Meds:  Current Facility-Administered Medications:  .  acetaminophen (TYLENOL) tablet 650 mg, 650 mg, Oral, Q6H PRN **OR** acetaminophen (TYLENOL) suppository 650 mg, 650 mg, Rectal, Q6H PRN, Shela Leff, MD .  albuterol (PROVENTIL) (2.5 MG/3ML) 0.083% nebulizer solution 2.5 mg, 2.5 mg, Inhalation, Q6H PRN, Shela Leff, MD .  heparin ADULT infusion 100 units/mL (25000 units/271mL sodium chloride 0.45%), 950 Units/hr, Intravenous, Continuous, Rebbeca Paul B, RPH, Last Rate: 9.5 mL/hr at 05/23/20 0508, 950 Units/hr at 05/23/20 0508 .  insulin aspart (novoLOG) injection 0-9 Units, 0-9 Units, Subcutaneous, Q4H, [place],  Wandra Feinstein, MD, 1 Units at 05/22/20 2055 .  levothyroxine (SYNTHROID) tablet 75 mcg, 75 mcg, Oral, Daily, Shela Leff, MD, 75 mcg at 05/23/20 0604 .  loratadine (CLARITIN) tablet 10 mg, 10 mg, Oral, Daily PRN, Shela Leff, MD .   MEDLINE mouth rinse, 15 mL, Mouth Rinse, BID, Shela Leff, MD, 15 mL at 05/23/20 0828 .  pantoprazole (PROTONIX) EC tablet 20 mg, 20 mg, Oral, Daily, Shela Leff, MD, 20 mg at 05/23/20 0827 .  sertraline (ZOLOFT) tablet 50 mg, 50 mg, Oral, Daily, Shela Leff, MD, 50 mg at 05/23/20 0827 .  simvastatin (ZOCOR) tablet 40 mg, 40 mg, Oral, q1800, Shela Leff, MD, 40 mg at 05/22/20 1827  Allergy:  Allergies  Allergen Reactions  . Metformin And Related Other (See Comments)    Social Hx:   Social History   Socioeconomic History  . Marital status: Divorced    Spouse name: Not on file  . Number of children: Not on file  . Years of education: Not on file  . Highest education level: Not on file  Occupational History  . Not on file  Tobacco Use  . Smoking status: Former Smoker    Packs/day: 1.00    Years: 6.00    Pack years: 6.00    Types: Cigarettes    Quit date: 10/01/1964    Years since quitting: 55.6  . Smokeless tobacco: Never Used  Vaping Use  . Vaping Use: Never used  Substance and Sexual Activity  . Alcohol use: No  . Drug use: No  . Sexual activity: Not Currently  Other Topics Concern  . Not on file  Social History Narrative  . Not on file   Social Determinants of Health   Financial Resource Strain:   . Difficulty of Paying Living Expenses: Not on file  Food Insecurity:   . Worried About Charity fundraiser in the Last Year: Not on file  . Ran Out of Food in the Last Year: Not on file  Transportation Needs:   . Lack of Transportation (Medical): Not on file  . Lack of Transportation (Non-Medical): Not on file  Physical Activity:   . Days of Exercise per Week: Not on file  . Minutes of Exercise per Session: Not on file  Stress:   . Feeling of Stress : Not on file  Social Connections:   . Frequency of Communication with Friends and Family: Not on file  . Frequency of Social Gatherings with Friends and Family: Not on file  . Attends  Religious Services: Not on file  . Active Member of Clubs or Organizations: Not on file  . Attends Archivist Meetings: Not on file  . Marital Status: Not on file  Intimate Partner Violence:   . Fear of Current or Ex-Partner: Not on file  . Emotionally Abused: Not on file  . Physically Abused: Not on file  . Sexually Abused: Not on file    Past Surgical Hx:  Past Surgical History:  Procedure Laterality Date  . ANKLE ARTHROSCOPY Right 03/29/2020   Procedure: RIGHT ANKLE ARTHROSCOPY AND DEBRIDEMENT;  Surgeon: Newt Minion, MD;  Location: [facility];  Service: Orthopedics;  Laterality: Right;  . BACK SURGERY    . BREAST BIOPSY Left   . CATARACT EXTRACTION W/ INTRAOCULAR LENS  IMPLANT, BILATERAL Bilateral   . COLONOSCOPY  10/25/2005   normal  . DILATION AND CURETTAGE OF UTERUS    . PARTIAL HYSTERECTOMY  1968  . POSTERIOR CERVICAL FUSION/FORAMINOTOMY     "  fused 4,5,6"  . TONSILLECTOMY      Past Medical Hx:  Past Medical History:  Diagnosis Date  . Anxiety   . Arthritis    "all over my body" (05/27/2017)  . Chronic lower back pain   . Depression   . Dyspnea    allergy related SOB  . Frequent UTI   . GERD (gastroesophageal reflux disease)   . Headache    "monthly" (05/27/2017)  . History of blood transfusion    "when I had my neck fusion"  . History of hiatal hernia   . Hypercholesteremia   . Hypertension   . Hypothyroid   . Pneumonia 2000s X 1; 05/27/2017  . Seasonal allergies   . Type II diabetes mellitus (HCC)     Family Hx:  Family History  Problem Relation Age of Onset  . Alzheimer's disease Mother   . Heart attack Father   . Heart disease Father   . Dwarfism Sister   . Colon cancer Neg Hx   . Colon polyps Neg Hx   . Esophageal cancer Neg Hx   . Rectal cancer Neg Hx   . Stomach cancer Neg Hx     Vitals:  Blood pressure 132/73, pulse 69, temperature (!) 97.3 F (36.3 C), temperature source Oral, resp. rate 18, height 5' (1.524  m), weight 136 lb 1.6 oz (61.7 kg), SpO2 98 %.  Physical Exam: performed by Dr. Berline Lopes  Assessment/Plan: 79 year old female currently admitted with acute pulmonary embolism/LLE DVT, acute respiratory failure, AKI, lactic acidosis with concern for metastatic disease of possible gyn origin on CT imaging. Cytology from the recent thoracentesis is pending.  Recommend proceeding with biopsy of the omentum per Dr. Berline Lopes.    Amber Gibbs, NP 05/23/2020, 12:23 PM

## 2020-05-23 NOTE — Progress Notes (Addendum)
PROGRESS NOTE    Amber Gray  SUP:[id number]  DOB: 1940-10-16  PCP: [name], MD Admit date:05/20/2020 Chief compliant: dyspnea Hospital course:79 year old Caucasian female with past medical history significant for right ankle arthroscopy and debridement for impingement and March 29, 2020, hypertension, hyperlipidemia, hypothyroidism, GERD and type 2 diabetes mellitus. presenting with complaints of shortness of breath.Patient states she has not been doing well since her recent ankle surgery. She has been very weak and feels dizzy when she tries to walk--activity level been low.Patient was admitted with submassive PE with right heart strain and left lower extremity DVT. ICU team was consulted but patient was not deemed a candidate for catheter directed thrombolysis. Patient is currently on IV heparin drip. Shortness of breath is improving. 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 Tried contacting GYN oncology over the weekend but was not successful. Currently consult GYN oncology tomorrow morning. Continue heparin for now. Further management depends on hospital course. CT angio chest PE protocol/CT abdomen pelvis with contrast revealed: 1. Large saddle pulmonary embolus with CT evidence of right heart strain (RV/LV ratio of 1.6) consistent with at least submassive (intermediate risk) PE. The presence of right heart strain has been associated with an increased risk of morbidity and mortality. 2. Large left and small right pleural effusions. 3. Large amount of peritoneal nodularity, consistent with peritoneal carcinomatosis. 4. Large midline pelvic mass, measuring up to 12.9 x 8.7 x 9.7 cm. 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  Echocardiogram revealed:. Left ventricular ejection fraction, by estimation, is 60 to 65%. The left ventricle has normal function,  mild left ventricular hypertrophy.  Grade I diastolic dysfunction (impaired relaxation). Interventricular septum is flattened in systole and diastole, consistent with right ventricular pressure and volume overload. Right ventricular systolic function is moderately reduced. The right ventricular size is moderately enlarged. There is mildly elevated  pulmonary artery systolic pressure.   Subjective: Patient resting comfortably.  Noted to be on 2 L O2 via nasal cannula.  Reports weight loss (152 pounds - >103 pounds) over last 2 months.  She denies any weight loss work-up as outpatient and states her PCP felt it was due to low oral intake following ankle surgery.  She also reports loose stools about 2 episodes per day while she has been here, no complaints of hematochezia or melena.  Objective: Vitals:   05/22/20 1920 05/22/20 1939 05/23/20 0438 05/23/20 0750  BP:  (!) 126/98 113/62 132/73  Pulse: 80 73 65 69  Resp:  '20 18 18  ' Temp:  97.6 F (36.4 C) 97.6 F (36.4 C) (!) 97.3 F (36.3 C)  TempSrc:  Oral Oral Oral  SpO2: 98% 98% 98% 98%  Weight:   61.7 kg   Height:        Intake/Output Summary (Last 24 hours) at 05/23/2020 0823 Last data filed at 05/22/2020 1500 Gross per 24 hour  Intake 84.49 ml  Output --  Net 84.49 ml   Filed Weights   05/21/20 0448 05/22/20 0359 05/23/20 0438  Weight: 58.8 kg 59.3 kg 61.7 kg    Physical Examination:  General: Moderately built, no acute distress noted Head ENT: Atraumatic normocephalic, PERRLA, neck supple Heart: S1-S2 heard, regular rate and rhythm, no murmurs.  No leg edema noted Lungs: Decreased breath sounds at bases.  Equal air entry bilaterally, no rhonchi or rales on exam, no accessory muscle use Abdomen: Midline ventral  hernia noted. Bowel sounds heard, soft, nontender, nondistended. No organomegaly.  No CVA tenderness Extremities: No pedal edema.  No cyanosis or clubbing. Neurological: Awake alert oriented x3, no focal weakness or  numbness, strength and sensations to crude touch intact Skin: No wounds or rashes.  Data Reviewed: I have personally reviewed following labs and imaging studies  CBC: Recent Labs  Lab 05/20/20 1458 05/22/20 0349 05/23/20 0722  WBC 9.4 8.0 7.2  NEUTROABS 7.6  --   --   HGB 11.6* 10.3* 11.1*  HCT 38.4 33.5* 36.6  MCV 87.9 86.6 87.1  PLT 249 239 756   Basic Metabolic Panel: Recent Labs  Lab 05/20/20 1458 05/22/20 0349 05/22/20 1109 05/23/20 0722  NA 138 136  --  133*  K 3.7 3.5  --  3.7  CL 101 105  --  102  CO2 17* 18*  --  19*  GLUCOSE 187* 111*  --  114*  BUN 33* 27*  --  21  CREATININE 1.45* 1.27*  --  1.20*  CALCIUM 9.3 8.5*  --  8.6*  MG  --   --  2.1  --   PHOS  --   --   --  3.1   GFR: Estimated Creatinine Clearance: 31.2 mL/min (A) (by C-G formula based on SCr of 1.2 mg/dL (H)). Liver Function Tests: Recent Labs  Lab 05/20/20 1458 05/23/20 0722  AST 27  --   ALT 14  --   ALKPHOS 201*  --   BILITOT 0.8  --   PROT 8.2*  --   ALBUMIN 3.1* 2.9*   No results for input(s): LIPASE, AMYLASE in the last 168 hours. No results for input(s): AMMONIA in the last 168 hours. Coagulation Profile: No results for input(s): INR, PROTIME in the last 168 hours. Cardiac Enzymes: No results for input(s): CKTOTAL, CKMB, CKMBINDEX, TROPONINI in the last 168 hours. BNP (last 3 results) No results for input(s): PROBNP in the last 8760 hours. HbA1C: Recent Labs    05/21/20 0247  HGBA1C 7.1*   CBG: Recent Labs  Lab 05/22/20 1543 05/22/20 2042 05/22/20 2358 05/23/20 0441 05/23/20 0746  GLUCAP 107* 139* 96 98 111*   Lipid Profile: No results for input(s): CHOL, HDL, LDLCALC, TRIG, CHOLHDL, LDLDIRECT in the last 72 hours. Thyroid Function Tests: No results for input(s): TSH, T4TOTAL, FREET4, T3FREE, THYROIDAB in the last 72 hours. Anemia Panel: No results for input(s): VITAMINB12, FOLATE, FERRITIN, TIBC, IRON, RETICCTPCT in the last 72 hours. Sepsis Labs: Recent  Labs  Lab 05/20/20 2332 05/21/20 0126  LATICACIDVEN 2.1* 2.0*    Recent Results (from the past 240 hour(s))  SARS Coronavirus 2 by RT PCR (hospital order, performed in [facility] hospital lab) Nasopharyngeal Nasopharyngeal Swab     Status: None   Collection Time: 05/20/20  8:27 PM   Specimen: Nasopharyngeal Swab  Result Value Ref Range Status   SARS Coronavirus 2 NEGATIVE NEGATIVE Final    Comment: (NOTE) SARS-CoV-2 target nucleic acids are NOT DETECTED.  The SARS-CoV-2 RNA is generally detectable in upper and lower respiratory specimens during the acute phase of infection. The lowest concentration of SARS-CoV-2 viral copies this assay can detect is 250 copies / mL. A negative result does not preclude SARS-CoV-2 infection and should not be used as the sole basis for treatment or other patient management decisions.  A negative result may occur with improper specimen collection / handling, submission of specimen other than nasopharyngeal swab, presence of viral mutation(s) within the areas targeted  by this assay, and inadequate number of viral copies (<250 copies / mL). A negative result must be combined with clinical observations, patient history, and epidemiological information.  Fact Sheet for Patients:   StrictlyIdeas.no  Fact Sheet for Healthcare Providers: BankingDealers.co.za  This test is not yet approved or  cleared by the [place] FDA and has been authorized for detection and/or diagnosis of SARS-CoV-2 by FDA under an Emergency Use Authorization (EUA).  This EUA will remain in effect (meaning this test can be used) for the duration of the COVID-19 declaration under Section 564(b)(1) of the Act, 21 U.S.C. section 360bbb-3(b)(1), unless the authorization is terminated or revoked sooner.  Performed at [facility], [place] [address] [place], [place] [address]   Gram stain     Status: None   Collection Time:  05/21/20 11:18 AM   Specimen: PATH Cytology Pleural fluid  Result Value Ref Range Status   Specimen Description CYTO PLEU  Final   Special Requests NONE  Final   Gram Stain   Final    WBC PRESENT,BOTH PMN AND MONONUCLEAR NO ORGANISMS SEEN CYTOSPIN SMEAR Performed at [facility], [address] [place], [place] [address]    Report Status 05/21/2020 FINAL  Final  Culture, body fluid-bottle     Status: None (Preliminary result)   Collection Time: 05/21/20 11:18 AM   Specimen: Pleura  Result Value Ref Range Status   Specimen Description PLEURAL FLUID  Final   Special Requests NONE  Final   Culture   Final    NO GROWTH 2 DAYS Performed at [facility] [address] [place], [place] [address]    Report Status PENDING  Incomplete      Radiology Studies: DG Chest 1 View  Result Date: 05/21/2020 CLINICAL DATA:  Post thoracentesis EXAM: CHEST  1 VIEW COMPARISON:  May 20, 2020 FINDINGS: The left-sided pleural effusion is smaller in the interval. No pneumothorax identified. The cardiomediastinal silhouette is stable. No other acute abnormalities. IMPRESSION: No pneumothorax after left thoracentesis. The left effusion is smaller. Electronically Signed   By: Dorise Bullion III M.D   On: 05/21/2020 11:53   VAS [place] IVC/ILIAC (VENOUS ONLY)  Result Date: 05/21/2020 IVC/ILIAC STUDY Indications: Pulmonary embolism with pelvic mass, left saphenofemoral junction              DVT Limitations: Air/bowel gas, patient discomfort and anatomic distortion secondary to pelvic mass.  Comparison Study: No prior study Performing Technologist: Maudry Mayhew MHA, RDMS, RVT, RDCS  Examination Guidelines: A complete evaluation includes B-mode imaging, spectral Doppler, color Doppler, and power Doppler as needed of all accessible portions of each vessel. Bilateral testing is considered an integral part of a complete examination. Limited examinations for reoccurring indications may be performed as  noted.  +-------------------+---------+-----------+---------+-----------+--------+         CIV        RT-PatentRT-ThrombusLT-PatentLT-ThrombusComments +-------------------+---------+-----------+---------+-----------+--------+ Common Iliac Distal patent              patent                      +-------------------+---------+-----------+---------+-----------+--------+  +------------------------+---------+-----------+---------+-----------+--------+           EIV           RT-PatentRT-ThrombusLT-PatentLT-ThrombusComments +------------------------+---------+-----------+---------+-----------+--------+ External Iliac Vein Prox patent              patent                      +------------------------+---------+-----------+---------+-----------+--------+  Summary: IVC/Iliac: Study was limited. There is no obvious evidence of occlusive thrombus involving the right common iliac vein and left common iliac vein. There is no obvious evidence of occlusive thrombus involving the right external iliac vein and left external iliac vein. Unable to visualize IVC due to technical limitations listed above.  *See table(s) above for measurements and observations.  Electronically signed by Deitra Mayo MD on 05/21/2020 at 3:15:52 PM.   Final    ECHOCARDIOGRAM COMPLETE  Result Date: 05/21/2020    ECHOCARDIOGRAM REPORT   Patient Name:   Amber Gray Date of Exam: 05/21/2020 Medical Rec #:  [id number]           Height:       60.0 in Accession #:    [id number]          Weight:       129.6 lb Date of Birth:  May 16, 1941           BSA:          1.552 m Patient Age:    64 years            BP:           118/69 mmHg Patient Gender: F                   HR:           69 bpm. Exam Location:  Inpatient Procedure: 2D Echo, Color Doppler, Cardiac Doppler and Intracardiac            Opacification Agent Indications:    Pulmonary embolus  History:        Patient has no prior history of Echocardiogram examinations.                  Signs/Symptoms:Chest Pain and Shortness of Breath; Risk                 Factors:Former Smoker, Diabetes and Hypertension. CKD.  Sonographer:    Clayton Lefort RDCS (AE) Referring Phys: [id number] [place]  1. Left ventricular ejection fraction, by estimation, is 60 to 65%. The left ventricle has normal function. The left ventricle has no regional wall motion abnormalities. There is mild left ventricular hypertrophy. Left ventricular diastolic parameters are consistent with Grade I diastolic dysfunction (impaired relaxation). There is the interventricular septum is flattened in systole and diastole, consistent with right ventricular pressure and volume overload.  2. Right ventricular systolic function is moderately reduced. The right ventricular size is moderately enlarged. There is mildly elevated pulmonary artery systolic pressure.  3. The mitral valve is normal in structure. Trivial mitral valve regurgitation.  4. The aortic valve is normal in structure. Aortic valve regurgitation is trivial. Mild to moderate aortic valve sclerosis/calcification is present, without any evidence of aortic stenosis. FINDINGS  Left Ventricle: Left ventricular ejection fraction, by estimation, is 60 to 65%. The left ventricle has normal function. The left ventricle has no regional wall motion abnormalities. Definity contrast agent was given IV to delineate the left ventricular  endocardial borders. The left ventricular internal cavity size was normal in size. There is mild left ventricular hypertrophy. The interventricular septum is flattened in systole and diastole, consistent with right ventricular pressure and volume overload. Left ventricular diastolic parameters are consistent with Grade I diastolic dysfunction (impaired relaxation). Right Ventricle: The right ventricular size is moderately enlarged. No increase in right ventricular wall thickness. Right ventricular systolic function is moderately  reduced. There is mildly elevated  pulmonary artery systolic pressure. The tricuspid regurgitant velocity is 2.92 m/s, and with an assumed right atrial pressure of 10 mmHg, the estimated right ventricular systolic pressure is 29.7 mmHg. Left Atrium: Left atrial size was normal in size. Right Atrium: Right atrial size was not assessed. Pericardium: There is no evidence of pericardial effusion. Mitral Valve: The mitral valve is normal in structure. Trivial mitral valve regurgitation. Tricuspid Valve: The tricuspid valve is normal in structure. Tricuspid valve regurgitation is mild. Aortic Valve: The aortic valve is normal in structure.. There is mild thickening of the aortic valve. Aortic valve regurgitation is trivial. Mild to moderate aortic valve sclerosis/calcification is present, without any evidence of aortic stenosis. There is mild thickening of the aortic valve. Aortic valve mean gradient measures 3.0 mmHg. Aortic valve peak gradient measures 6.1 mmHg. Aortic valve area, by VTI measures 1.84 cm. Pulmonic Valve: The pulmonic valve was grossly normal. Pulmonic valve regurgitation is mild. Aorta: The aortic root and ascending aorta are structurally normal, with no evidence of dilitation. IAS/Shunts: No atrial level shunt detected by color flow Doppler.  LEFT VENTRICLE PLAX 2D LVIDd:         2.70 cm  Diastology LVIDs:         2.00 cm  LV e' lateral:   6.09 cm/s LV PW:         1.30 cm  LV E/e' lateral: 8.0 LV IVS:        1.20 cm  LV e' medial:    4.46 cm/s LVOT diam:     1.65 cm  LV E/e' medial:  10.9 LV SV:         45 LV SV Index:   29 LVOT Area:     2.14 cm  RIGHT VENTRICLE             IVC RV Basal diam:  2.70 cm     IVC diam: 1.60 cm RV S prime:     10.40 cm/s TAPSE (M-mode): 1.8 cm LEFT ATRIUM           Index       RIGHT ATRIUM           Index LA diam:      2.60 cm 1.67 cm/m  RA Area:     13.40 cm LA Vol (A4C): 28.1 ml 18.10 ml/m RA Volume:   35.20 ml  22.67 ml/m  AORTIC VALVE AV Area (Vmax):    1.93 cm  AV Area (Vmean):   1.74 cm AV Area (VTI):     1.84 cm AV Vmax:           123.00 cm/s AV Vmean:          79.700 cm/s AV VTI:            0.243 m AV Peak Grad:      6.1 mmHg AV Mean Grad:      3.0 mmHg LVOT Vmax:         111.00 cm/s LVOT Vmean:        64.800 cm/s LVOT VTI:          0.209 m LVOT/AV VTI ratio: 0.86  AORTA Ao Root diam: 3.00 cm Ao Asc diam:  3.10 cm MITRAL VALVE               TRICUSPID VALVE MV Area (PHT): 2.62 cm    TR Peak grad:   34.1 mmHg MV Decel Time: 289 msec    TR Vmax:  292.00 cm/s MV E velocity: 48.80 cm/s MV A velocity: 84.00 cm/s  SHUNTS MV E/A ratio:  0.58        Systemic VTI:  0.21 m                            Systemic Diam: 1.65 cm Glori Bickers MD Electronically signed by Glori Bickers MD Signature Date/Time: 05/21/2020/2:27:06 PM    Final    VAS [place] LOWER EXTREMITY VENOUS (DVT)  Result Date: 05/21/2020  Lower Venous DVTStudy Indications: Pulmonary embolism, and pelvic mass.  Limitations: Poor ultrasound/tissue interface, body habitus and patient position. Comparison Study: No prior study Performing Technologist: Maudry Mayhew MHA, RDMS, RVT, RDCS  Examination Guidelines: A complete evaluation includes B-mode imaging, spectral Doppler, color Doppler, and power Doppler as needed of all accessible portions of each vessel. Bilateral testing is considered an integral part of a complete examination. Limited examinations for reoccurring indications may be performed as noted. The reflux portion of the exam is performed with the patient in reverse Trendelenburg.  +---------+---------------+---------+-----------+----------+--------------+ RIGHT    CompressibilityPhasicitySpontaneityPropertiesThrombus Aging +---------+---------------+---------+-----------+----------+--------------+ CFV      Full           Yes      Yes                                 +---------+---------------+---------+-----------+----------+--------------+ SFJ      Full                                                         +---------+---------------+---------+-----------+----------+--------------+ FV Prox  Full                                                        +---------+---------------+---------+-----------+----------+--------------+ FV Mid   Full                                                        +---------+---------------+---------+-----------+----------+--------------+ FV DistalFull                                                        +---------+---------------+---------+-----------+----------+--------------+ PFV      Full                                                        +---------+---------------+---------+-----------+----------+--------------+ POP      Full           Yes      Yes                                 +---------+---------------+---------+-----------+----------+--------------+  PTV      Full                                                        +---------+---------------+---------+-----------+----------+--------------+ PERO     Full                                                        +---------+---------------+---------+-----------+----------+--------------+   +-------+---------------+---------+-----------+----------+--------------+ LEFT   CompressibilityPhasicitySpontaneityPropertiesThrombus Aging +-------+---------------+---------+-----------+----------+--------------+ CFV    Full           No       Yes                                 +-------+---------------+---------+-----------+----------+--------------+ SFJ    None                                         Acute          +-------+---------------+---------+-----------+----------+--------------+ FV ProxFull                                                        +-------+---------------+---------+-----------+----------+--------------+ FV Mid Full                                                         +-------+---------------+---------+-----------+----------+--------------+ PTV    None                    No                   Acute          +-------+---------------+---------+-----------+----------+--------------+ PERO   Full                                                        +-------+---------------+---------+-----------+----------+--------------+ GSV    None                                         Acute at [facility]   +-------+---------------+---------+-----------+----------+--------------+   Left Technical Findings: Not visualized segments include PFV, distal FV, popliteal vein.   Summary: RIGHT: - There is no evidence of deep vein thrombosis in the lower extremity.  - No cystic structure found in the popliteal fossa.  LEFT: - Findings consistent with acute deep vein thrombosis involving the SF junction, and left posterior tibial veins. - No cystic structure found in the popliteal fossa.  *See  table(s) above for measurements and observations. Electronically signed by Deitra Mayo MD on 05/21/2020 at 3:16:04 PM.    Final    US THORACENTESIS ASP PLEURAL SPACE W/IMG GUIDE  Result Date: 05/21/2020 INDICATION: Patient with history of dyspnea, large saddle pulmonary embolism, bilateral pleural effusions left greater than right, peritoneal nodularity, pelvic mass; request received for diagnostic and therapeutic left thoracentesis. EXAM: ULTRASOUND GUIDED DIAGNOSTIC AND THERAPEUTIC LEFT THORACENTESIS MEDICATIONS: 1% lidocaine to skin and subcutaneous tissue COMPLICATIONS: None immediate. PROCEDURE: An ultrasound guided thoracentesis was thoroughly discussed with the patient and questions answered. The benefits, risks, alternatives and complications were also discussed. The patient understands and wishes to proceed with the procedure. Written consent was obtained. Ultrasound was performed to localize and mark an adequate pocket of fluid in the left chest. The area was then prepped and draped  in the normal sterile fashion. 1% Lidocaine was used for local anesthesia. Under ultrasound guidance a 6 Fr Safe-T-Centesis catheter was introduced. Thoracentesis was performed. The catheter was removed and a dressing applied. FINDINGS: A total of approximately 580 cc of slightly hazy, amber fluid was removed. Samples were sent to the laboratory as requested by the clinical team. IMPRESSION: Successful ultrasound guided diagnostic and therapeutic left thoracentesis yielding 580 cc of pleural fluid. Read by: Rowe Robert, PA-C Electronically Signed   By: Jacqulynn Cadet M.D.   On: 05/21/2020 11:05      Scheduled Meds: . insulin aspart  0-9 Units Subcutaneous Q4H  . levothyroxine  75 mcg Oral Daily  . mouth rinse  15 mL Mouth Rinse BID  . pantoprazole  20 mg Oral Daily  . sertraline  50 mg Oral Daily  . simvastatin  40 mg Oral q1800   Continuous Infusions: . heparin 950 Units/hr (05/23/20 0508)     Assessment & Plan:   Principal Problem:   Acute pulmonary embolism (HCC) Active Problems:   Acute respiratory failure with hypoxia (HCC)   Pleural effusion   AKI (acute kidney injury) (HCC)   Lactic acidosis  Acute hypoxic respiratory failure secondary to acute large saddle pulmonary embolus with CT evidence of right heart strain: Continue heparin drip for now.  Echo report and CT findings as documented above. PE precipitated by recent ankle surgery/reduced physical activity vs  underlying malignancy. GYN oncology consulted.  Noted positive L acute deep vein thrombosis involving the SF  junction, and left posterior tibial veins. On call NP discussed case with PCCM, Dr. June Leap and as patient is stable and oxygenating well, continued anticoagulation with heparin recommended.  Plan is to transition to oral anticoagulation depending on hospital course/need for procedures.   Weight loss-concern for primary gynecologic malignancy: -CT abdomen pelvis showing a large amount of peritoneal  nodularity consistent with peritoneal carcinomatosis. Large midline pelvic mass, measuring up to 12.9 x 8.7 x 9.7 cm concerning for a primary gynecologic malignancy.  Patient states her last Pap smear was more than 10 years back.  Denies any vaginal bleeding.  Has had significant weight loss (48 pounds) over last 2 months.  Consultedgyn onctoday-discussed with Dr Berline Lopes who recommended IR consult for possible omental biopsy and they will evaluate patient today.  Large left pleural effusion: -Seen on CT along with a small right pleural effusion.Ultrasound-guided diagnostic and therapeutic left thoracentesis performed yielding 580 cc of slightly hazy, amber fluid. Pleural fluid analysis -exudative.  ?  Sent for cytology  Mild AKI:-Could be multifactorial. Now improved significantly.  .  High anion gap metabolic acidosis: -Anion gap has resolved.  CO2 was 18 today.    Elevated alkaline phosphatase: may be related to the CT pelvis finding.   Alk phos elevated at 201, remainder of LFTs normal.Mildly elevated GGT (15)   Hyperlipidemia-Continue Zocor  Hypothyroidism-Continue Synthroid  Non-insulin-dependent type 2 diabetes-Hemoglobin was 7.1%.   Continue to optimize blood sugar control.    Diarrhea: Check stool for C. difficile.  Loperamide as needed if negative.  Deconditioning: Patient reports loss of energy and fatigue ever since her ankle surgery.  She has also had massive weight loss.  Now also hypoxic requiring O2 2 L.  Will request PT evaluation and follow recommendations regarding need for rehab.  Patient lives alone at an independent living facility.  DVT prophylaxis: Heparin drip Code Status: Full code Family / Patient Communication:  Disposition Plan:   Status is: Inpatient  Remains inpatient appropriate because:Ongoing diagnostic testing needed not appropriate for outpatient work up   Dispo: The patient is from: Home              Anticipated d/c is to: May need rehab               Anticipated d/c date is: 2 -3 days              Patient currently is not medically stable to d/c.     Time spent: 40 mins    >50% time spent in discussions with care team and coordination of care.    Guilford Shi, MD [facility] Pager in [place]  If 7PM-7AM, please contact night-coverage [email or web address] 05/23/2020, 8:23 AM

## 2020-05-24 ENCOUNTER — Encounter: Payer: Self-pay | Admitting: Oncology

## 2020-05-24 ENCOUNTER — Inpatient Hospital Stay (HOSPITAL_COMMUNITY): Payer: Medicare Other

## 2020-05-24 ENCOUNTER — Encounter (HOSPITAL_COMMUNITY): Payer: Self-pay | Admitting: Internal Medicine

## 2020-05-24 ENCOUNTER — Other Ambulatory Visit: Payer: Self-pay

## 2020-05-24 DIAGNOSIS — C800 Disseminated malignant neoplasm, unspecified: Secondary | ICD-10-CM

## 2020-05-24 HISTORY — PX: IR PARACENTESIS: IMG2679

## 2020-05-24 HISTORY — PX: IR IMAGING GUIDED PORT INSERTION: IMG5740

## 2020-05-24 LAB — CBC
HCT: 32.8 % — ABNORMAL LOW (ref 36.0–46.0)
Hemoglobin: 10.1 g/dL — ABNORMAL LOW (ref 12.0–15.0)
MCH: 26.9 pg (ref 26.0–34.0)
MCHC: 30.8 g/dL (ref 30.0–36.0)
MCV: 87.5 fL (ref 80.0–100.0)
Platelets: 235 10*3/uL (ref 150–400)
RBC: 3.75 MIL/uL — ABNORMAL LOW (ref 3.87–5.11)
RDW: 15.4 % (ref 11.5–15.5)
WBC: 6.1 10*3/uL (ref 4.0–10.5)
nRBC: 0 % (ref 0.0–0.2)

## 2020-05-24 LAB — PROTIME-INR
INR: 1.1 (ref 0.8–1.2)
Prothrombin Time: 13.8 seconds (ref 11.4–15.2)

## 2020-05-24 LAB — GLUCOSE, CAPILLARY
Glucose-Capillary: 109 mg/dL — ABNORMAL HIGH (ref 70–99)
Glucose-Capillary: 118 mg/dL — ABNORMAL HIGH (ref 70–99)
Glucose-Capillary: 100 mg/dL — ABNORMAL HIGH (ref 70–99)
Glucose-Capillary: 111 mg/dL — ABNORMAL HIGH (ref 70–99)

## 2020-05-24 LAB — HEPARIN LEVEL (UNFRACTIONATED): Heparin Unfractionated: 0.66 IU/mL (ref 0.30–0.70)

## 2020-05-24 MED ORDER — CEFAZOLIN SODIUM-DEXTROSE 2-4 GM/100ML-% IV SOLN
2.0000 g | Freq: Once | INTRAVENOUS | Status: AC
  Administered 2020-05-24: 2 g via INTRAVENOUS
  Filled 2020-05-24: qty 100

## 2020-05-24 MED ORDER — GELATIN ABSORBABLE 12-7 MM EX MISC
CUTANEOUS | Status: AC
  Filled 2020-05-24: qty 1

## 2020-05-24 MED ORDER — LIDOCAINE-EPINEPHRINE 1 %-1:100000 IJ SOLN
INTRAMUSCULAR | Status: AC
  Administered 2020-05-24: 10 mL
  Filled 2020-05-24: qty 1

## 2020-05-24 MED ORDER — LIDOCAINE-EPINEPHRINE 1 %-1:100000 IJ SOLN
INTRAMUSCULAR | Status: AC | PRN
  Administered 2020-05-24: 10 mL

## 2020-05-24 MED ORDER — MIDAZOLAM HCL 2 MG/2ML IJ SOLN
INTRAMUSCULAR | Status: AC
  Filled 2020-05-24: qty 2

## 2020-05-24 MED ORDER — FENTANYL CITRATE (PF) 100 MCG/2ML IJ SOLN
INTRAMUSCULAR | Status: AC
  Filled 2020-05-24: qty 2

## 2020-05-24 MED ORDER — CHLORHEXIDINE GLUCONATE CLOTH 2 % EX PADS
6.0000 | MEDICATED_PAD | Freq: Every day | CUTANEOUS | Status: DC
  Administered 2020-05-25: 6 via TOPICAL

## 2020-05-24 MED ORDER — HEPARIN SOD (PORK) LOCK FLUSH 100 UNIT/ML IV SOLN
INTRAVENOUS | Status: AC
  Filled 2020-05-24: qty 5

## 2020-05-24 MED ORDER — MIDAZOLAM HCL 2 MG/2ML IJ SOLN
INTRAMUSCULAR | Status: AC | PRN
  Administered 2020-05-24: 0.5 mg via INTRAVENOUS

## 2020-05-24 MED ORDER — FENTANYL CITRATE (PF) 100 MCG/2ML IJ SOLN
INTRAMUSCULAR | Status: AC | PRN
  Administered 2020-05-24: 50 ug via INTRAVENOUS
  Administered 2020-05-24: 25 ug via INTRAVENOUS

## 2020-05-24 MED ORDER — FENTANYL CITRATE (PF) 100 MCG/2ML IJ SOLN
INTRAMUSCULAR | Status: AC | PRN
Start: 2020-05-24 — End: 2020-05-24
  Administered 2020-05-24: 25 ug via INTRAVENOUS

## 2020-05-24 MED ORDER — MIDAZOLAM HCL 2 MG/2ML IJ SOLN
INTRAMUSCULAR | Status: AC | PRN
  Administered 2020-05-24: 0.5 mg via INTRAVENOUS
  Administered 2020-05-24: 1 mg via INTRAVENOUS

## 2020-05-24 MED ORDER — CEFAZOLIN SODIUM-DEXTROSE 2-4 GM/100ML-% IV SOLN
INTRAVENOUS | Status: AC
  Filled 2020-05-24: qty 100

## 2020-05-24 MED ORDER — OXYCODONE HCL 5 MG PO TABS: 5.0000 mg | ORAL_TABLET | Freq: Four times a day (QID) | ORAL | Status: DC | PRN

## 2020-05-24 NOTE — Progress Notes (Signed)
GYN Follow-up Note 8/24  Follow-up note after patient seen and extensive discussion had with her and her daughter in the evening yesterday.  As I suspected, cytology from thoracentesis was nondiagnostic.  Continue to recommend that IR guided omental biopsy be performed for tissue diagnosis. Patient overall states that she is feeling well today, notes that her breathing is mildly improved.  We discussed the recommendation for port placement.  This is usually done after diagnosis of malignancy; however, given clinical picture as well as her recent large venous thromboembolic event and wanting to limit the time off of anticoagulation, I like to see if we can get this done while she is still on a heparin drip before transition to an oral anticoagulant.  We'll reach out to interventional radiology to see if this is feasible.  If it is not, we will plan to get this placed in the next 1-2 weeks in the outpatient setting.  Patient was amenable.  I called the patient's daughter and updated her on plan of care.  CA-125 and CEA pending  [name] Gynecologic Oncology

## 2020-05-24 NOTE — Progress Notes (Addendum)
PROGRESS NOTE    Amber Gray  OVZ:[id number]  DOB: [birth year]  PCP: [name], MD Admit date:05/20/2020 Chief compliant: dyspnea Hospital course:79 year old Caucasian female with past medical history significant for right ankle arthroscopy and debridement for impingement and March 29, 2020, hypertension, hyperlipidemia, hypothyroidism, GERD and type 2 diabetes mellitus. presenting with complaints of shortness of breath.Patient states she has not been doing well since her recent ankle surgery. She has been very weak and feels dizzy when she tries to walk--activity level been low.Patient was admitted with submassive PE with right heart strain and left lower extremity DVT. ICU team was consulted but patient was not deemed a candidate for catheter directed thrombolysis. Patient is currently on IV heparin drip. Shortness of breath is improving. 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 .Reports weight loss (152 pounds - >103 pounds) over last 2 months.  She denies any weight loss work-up as outpatient and states her PCP felt it was due to low oral intake following ankle surgery.   Discussed with GYN oncology who recommended peritoneal biopsy via IR and also port placement.   CT angio chest PE protocol/CT abdomen pelvis with contrast revealed: 1. Large saddle pulmonary embolus with CT evidence of right heart strain (RV/LV ratio of 1.6) consistent with at least submassive (intermediate risk) PE. The presence of right heart strain has been associated with an increased risk of morbidity and mortality. 2. Large left and small right pleural effusions. 3. Large amount of peritoneal nodularity, consistent with peritoneal carcinomatosis. 4. Large midline pelvic mass, measuring up to 12.9 x 8.7 x 9.7 cm. 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  Echocardiogram revealed:.  Left ventricular ejection fraction, by estimation, is 60 to 65%. The left ventricle has normal function, mild left ventricular hypertrophy.  Grade I diastolic dysfunction (impaired relaxation). Interventricular septum is flattened in systole and diastole, consistent with right ventricular pressure and volume overload. Right ventricular systolic function is moderately reduced. The right ventricular size is moderately enlarged. There is mildly elevated  pulmonary artery systolic pressure.   Subjective: Patient resting comfortably and reading a book.  She is still on 2-3 L O2 via nasal cannula.  Objective: Vitals:   05/24/20 0532 05/24/20 0739 05/24/20 1154 05/24/20 1159  BP:  129/77 117/69 117/69  Pulse: 70 65 66 66  Resp: '18 20  17  ' Temp: 98.6 F (37 C) 98 F (36.7 C)  97.6 F (36.4 C)  TempSrc: Oral Oral  Oral  SpO2: 98% 100% 100% 100%  Weight: 58.7 kg     Height:        Intake/Output Summary (Last 24 hours) at 05/24/2020 1259 Last data filed at 05/24/2020 0845 Gross per 24 hour  Intake 240 ml  Output 600 ml  Net -360 ml   Filed Weights   05/22/20 0359 05/23/20 0438 05/24/20 0532  Weight: 59.3 kg 61.7 kg 58.7 kg    Physical Examination:  General: Moderately built, no acute distress noted Head ENT: Atraumatic normocephalic, PERRLA, neck supple Heart: S1-S2 heard, regular rate and rhythm, no murmurs.  No leg edema noted Lungs: Decreased breath sounds at bases.  Equal air entry bilaterally, no rhonchi or rales on exam, no accessory muscle use Abdomen: Midline ventral hernia noted. Bowel sounds heard, soft, nontender, nondistended. No organomegaly.  No CVA tenderness Extremities: No pedal edema.  No cyanosis or clubbing. Neurological: Awake alert oriented x3, no  focal weakness or numbness, strength and sensations to crude touch intact Skin: No wounds or rashes.  Data Reviewed: I have personally reviewed following labs and imaging studies  CBC: Recent Labs  Lab  05/20/20 1458 05/22/20 0349 05/23/20 0722 05/24/20 0619  WBC 9.4 8.0 7.2 6.1  NEUTROABS 7.6  --   --   --   HGB 11.6* 10.3* 11.1* 10.1*  HCT 38.4 33.5* 36.6 32.8*  MCV 87.9 86.6 87.1 87.5  PLT 249 239 262 600   Basic Metabolic Panel: Recent Labs  Lab 05/20/20 1458 05/22/20 0349 05/22/20 1109 05/23/20 0722  NA 138 136  --  133*  K 3.7 3.5  --  3.7  CL 101 105  --  102  CO2 17* 18*  --  19*  GLUCOSE 187* 111*  --  114*  BUN 33* 27*  --  21  CREATININE 1.45* 1.27*  --  1.20*  CALCIUM 9.3 8.5*  --  8.6*  MG  --   --  2.1  --   PHOS  --   --   --  3.1   GFR: Estimated Creatinine Clearance: 30.5 mL/min (A) (by C-G formula based on SCr of 1.2 mg/dL (H)). Liver Function Tests: Recent Labs  Lab 05/20/20 1458 05/23/20 0722  AST 27  --   ALT 14  --   ALKPHOS 201*  --   BILITOT 0.8  --   PROT 8.2*  --   ALBUMIN 3.1* 2.9*   No results for input(s): LIPASE, AMYLASE in the last 168 hours. No results for input(s): AMMONIA in the last 168 hours. Coagulation Profile: Recent Labs  Lab 05/24/20 0619  INR 1.1   Cardiac Enzymes: No results for input(s): CKTOTAL, CKMB, CKMBINDEX, TROPONINI in the last 168 hours. BNP (last 3 results) No results for input(s): PROBNP in the last 8760 hours. HbA1C: No results for input(s): HGBA1C in the last 72 hours. CBG: Recent Labs  Lab 05/23/20 1618 05/23/20 2019 05/24/20 0735 05/24/20 0837 05/24/20 1154  GLUCAP 156* 133* 109* 118* 100*   Lipid Profile: No results for input(s): CHOL, HDL, LDLCALC, TRIG, CHOLHDL, LDLDIRECT in the last 72 hours. Thyroid Function Tests: No results for input(s): TSH, T4TOTAL, FREET4, T3FREE, THYROIDAB in the last 72 hours. Anemia Panel: No results for input(s): VITAMINB12, FOLATE, FERRITIN, TIBC, IRON, RETICCTPCT in the last 72 hours. Sepsis Labs: Recent Labs  Lab 05/20/20 2332 05/21/20 0126  LATICACIDVEN 2.1* 2.0*    Recent Results (from the past 240 hour(s))  SARS Coronavirus 2 by RT PCR  (hospital order, performed in [facility] hospital lab) Nasopharyngeal Nasopharyngeal Swab     Status: None   Collection Time: 05/20/20  8:27 PM   Specimen: Nasopharyngeal Swab  Result Value Ref Range Status   SARS Coronavirus 2 NEGATIVE NEGATIVE Final    Comment: (NOTE) SARS-CoV-2 target nucleic acids are NOT DETECTED.  The SARS-CoV-2 RNA is generally detectable in upper and lower respiratory specimens during the acute phase of infection. The lowest concentration of SARS-CoV-2 viral copies this assay can detect is 250 copies / mL. A negative result does not preclude SARS-CoV-2 infection and should not be used as the sole basis for treatment or other patient management decisions.  A negative result may occur with improper specimen collection / handling, submission of specimen other than nasopharyngeal swab, presence of viral mutation(s) within the areas targeted by this assay, and inadequate number of viral copies (<250 copies / mL). A negative result must be combined with clinical  observations, patient history, and epidemiological information.  Fact Sheet for Patients:   StrictlyIdeas.no  Fact Sheet for Healthcare Providers: BankingDealers.co.za  This test is not yet approved or  cleared by the [place] FDA and has been authorized for detection and/or diagnosis of SARS-CoV-2 by FDA under an Emergency Use Authorization (EUA).  This EUA will remain in effect (meaning this test can be used) for the duration of the COVID-19 declaration under Section 564(b)(1) of the Act, 21 U.S.C. section 360bbb-3(b)(1), unless the authorization is terminated or revoked sooner.  Performed at [facility], [place] [address] [place], [place] [address]   Gram stain     Status: None   Collection Time: 05/21/20 11:18 AM   Specimen: PATH Cytology Pleural fluid  Result Value Ref Range Status   Specimen Description CYTO PLEU  Final   Special  Requests NONE  Final   Gram Stain   Final    WBC PRESENT,BOTH PMN AND MONONUCLEAR NO ORGANISMS SEEN CYTOSPIN SMEAR Performed at [facility], [address] [place], [place] [address]    Report Status 05/21/2020 FINAL  Final  Culture, body fluid-bottle     Status: None (Preliminary result)   Collection Time: 05/21/20 11:18 AM   Specimen: Pleura  Result Value Ref Range Status   Specimen Description PLEURAL FLUID  Final   Special Requests NONE  Final   Culture   Final    NO GROWTH 3 DAYS Performed at [place] [address] [place], [place] [address]    Report Status PENDING  Incomplete  C Difficile Quick Screen w PCR reflex     Status: None   Collection Time: 05/22/20  7:51 PM   Specimen: STOOL  Result Value Ref Range Status   C Diff antigen NEGATIVE NEGATIVE Final   C Diff toxin NEGATIVE NEGATIVE Final   C Diff interpretation No C. difficile detected.  Final    Comment: Performed at [facility], [place] [address] [place], [place] [address]      Radiology Studies: No results found.    Scheduled Meds:  insulin aspart  0-9 Units Subcutaneous TID WC   levothyroxine  75 mcg Oral Daily   mouth rinse  15 mL Mouth Rinse BID   pantoprazole  20 mg Oral Daily   sertraline  50 mg Oral Daily   simvastatin  40 mg Oral q1800   Continuous Infusions:  heparin 950 Units/hr (05/24/20 0730)     Assessment & Plan:   Principal Problem:   Acute pulmonary embolism ([place]) Active Problems:   Acute respiratory failure with hypoxia (HCC)   Pleural effusion   AKI (acute kidney injury) (HCC)   Lactic acidosis  Acute hypoxic respiratory failure secondary to acute large saddle pulmonary embolus with CT evidence of right heart strain: Continue heparin drip for now.  Echo report and CT findings as documented above. PE precipitated by recent ankle surgery/reduced physical activity vs  underlying malignancy. GYN oncology consulted.  Noted positive L acute deep vein  thrombosis involving the SF  junction, and left posterior tibial veins. On call NP discussed case with PCCM, Dr. June Leap and as patient is stable and oxygenating well, continued anticoagulation with heparin recommended.  Plan is to transition to oral anticoagulation when no further procedures planned and cleared by GYN oncology.  Weight loss-concern for primary gynecologic malignancy: -CT abdomen pelvis showing a large amount of peritoneal nodularity consistent with peritoneal carcinomatosis. Large midline pelvic mass, measuring  up to 12.9 x 8.7 x 9.7 cm concerning for a primary gynecologic malignancy.  Patient states her last Pap smear was more than 10 years back.  Denies any vaginal bleeding.  Has had significant weight loss (48 pounds) over last 2 months.  Consultedgyn onctoday-discussed with Dr Berline Lopes who recommended IR consult for possible omental biopsy as well as port placement before transitioning to oral anticoagulation.  IR consulted and they have seen patient.  Large left pleural effusion: -Seen on CT along with a small right pleural effusion.Ultrasound-guided diagnostic and therapeutic left thoracentesis performed yielding 580 cc of slightly hazy, amber fluid with WBC and elevated LDH. Pleural fluid cultures so far negative.  Pleural fluid cytology reported reactive mesothelial cells.  Mild AKI:-Could be multifactorial. Now improved significantly.  .  High anion gap metabolic acidosis: -Anion gap has resolved.  CO2 improving and up to 19 today  Elevated alkaline phosphatase: may be related to the CT pelvis finding.   Alk phos elevated at 201, remainder of LFTs normal.Mildly elevated GGT (15)   Hyperlipidemia-Continue Zocor  Hypothyroidism-Continue Synthroid  Non-insulin-dependent type 2 diabetes-Hemoglobin was 7.1%.   Continue to optimize blood sugar control.    Diarrhea: Stool C. difficile negative.  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  Supportive management.  Deconditioning: Patient reports loss of energy and fatigue ever since her ankle surgery.  She has also had massive weight loss.  Now also hypoxic requiring O2 2 L.  Will request PT evaluation and follow recommendations regarding need for rehab.  Patient lives alone at an independent living facility.  DVT prophylaxis: Heparin drip Code Status: Full code Family / Patient Communication:  Disposition Plan:   Status is: Inpatient  Remains inpatient appropriate because:Ongoing diagnostic testing needed not appropriate for outpatient work up IV heparin, pending the invasive procedures   Dispo: The patient is from: Independent living facility              Anticipated d/c is to: [facility]              Anticipated d/c date is: 2 days              Patient currently is not medically stable to d/c.     Time spent: 35 mins    >50% time spent in discussions with care team and coordination of care.    Guilford Shi, MD [facility] Pager in [place]  If 7PM-7AM, please contact night-coverage [email or web address] 05/24/2020, 12:59 PM

## 2020-05-24 NOTE — Sedation Documentation (Signed)
Handoff from Roe Coombs, RN.

## 2020-05-24 NOTE — Procedures (Signed)
Pre Procedure Dx: Poor venous access Post Procedural Dx: Same  Successful placement of right IJ approach port-a-cath with tip at the superior caval atrial junction. The catheter is ready for immediate use.  Estimated Blood Loss: Minimal  Complications: None immediate.  [place] Tyreak Reagle, MD Pager #: [phone]   

## 2020-05-24 NOTE — Procedures (Signed)
Pre Procedural Dx: Symptomatic Ascites Post Procedural Dx: Same  Successful US guided paracentesis yielding 2.2 L of serous, slightly blood tinged ascitic fluid. Sample sent to laboratory as requested.  EBL: None Complications: None immediate  Ronny Bacon, MD Pager #: [phone]

## 2020-05-24 NOTE — TOC Progression Note (Signed)
Transition of Care [facility]) - Progression Note    Patient Details  Name: Amber Gray MRN: [id number] Date of Birth: [birth year]  Transition of Care [facility]) CM/SW [place], [place] Phone Number: 05/24/2020, 4:26 PM  Clinical Narrative:     CSW just spoke with [facility] with [facility] and they have reviewed clinicals and  can not offer a SNF bed for patient. CSW called patients daughter Blanch Media to let her know that [facility] could not offer a bed for SNF placement. Patients daughter gave CSW permission to fax out referral to other SNF in [place] area. CSW called [employer] to update them that SNF choice is still pending. Insurance authorization has been approved. Approval number is [id number]. Next review date is 8/27. CSW will need to call [facility] when SNF choice is made.  Expected Discharge Plan: [place] Barriers to Discharge: Continued Medical Work up  Expected Discharge Plan and Services Expected Discharge Plan: [place] arrangements for the past 2 months: Apartment                                       Social Determinants of Health (SDOH) Interventions    Readmission Risk Interventions No flowsheet data found.

## 2020-05-24 NOTE — Progress Notes (Signed)
[facility] and discussed port placement.  Advised her that I will see her this afternoon to show her an example of a port and to discuss it further.

## 2020-05-24 NOTE — Progress Notes (Signed)
IR.  Patient is scheduled for an image-guided omental mass biopsy today in IR. Primary team requesting Port-a-cath placement at same time as omental biopsy due to anticoagulation hold for biopsy (do not want to hold Heparin again given DVTs). Case discussed with Dr. Pascal Lux who approves Port-a-cath placement today. Above discussed with patient who agrees to proceed at this time. Ancef ordered.  Risks and benefits of image-guided Port-a-catheter placement were discussed with the patient including, but not limited to bleeding, infection, pneumothorax, or fibrin sheath development and need for additional procedures. All of the patient's questions were answered, patient is agreeable to proceed. Consent signed and in chart.  Please call IR with questions/concerns.   Bea Graff Cardelia Sassano, PA-C 05/24/2020, 1:25 PM

## 2020-05-24 NOTE — Progress Notes (Signed)
[place] for Heparin Indication: pulmonary embolus  Allergies  Allergen Reactions  . Metformin And Related Other (See Comments)    Patient Measurements: Height: 5' (152.4 cm) Weight: 58.7 kg (129 lb 6.6 oz) IBW/kg (Calculated) : 45.5  Heparin dosing weight: 57.5 kg  Vital Signs: Temp: 98 F (36.7 C) (08/24 0739) Temp Source: Oral (08/24 0739) BP: 129/77 (08/24 0739) Pulse Rate: 65 (08/24 0739)  Labs: Recent Labs    05/21/20 1132 05/21/20 1458 05/21/20 2048 05/22/20 0349 05/22/20 0349 05/23/20 0722 05/24/20 0619  HGB  --   --   --  10.3*   < > 11.1* 10.1*  HCT  --   --   --  33.5*  --  36.6 32.8*  PLT  --   --   --  239  --  262 235  LABPROT  --   --   --   --   --   --  13.8  INR  --   --   --   --   --   --  1.1  HEPARINUNFRC 0.91*  --    < > 0.65  --  0.72* 0.66  CREATININE  --   --   --  1.27*  --  1.20*  --   TROPONINIHS 36* 40*  --   --   --   --   --    < > = values in this interval not displayed.    Estimated Creatinine Clearance: 30.5 mL/min (A) (by C-G formula based on SCr of 1.2 mg/dL (H)).   Medical History: Past Medical History:  Diagnosis Date  . Anxiety   . Arthritis    "all over my body" (05/27/2017)  . Chronic lower back pain   . Depression   . Dyspnea    allergy related SOB  . Frequent UTI   . GERD (gastroesophageal reflux disease)   . Headache    "monthly" (05/27/2017)  . History of blood transfusion    "when I had my neck fusion"  . History of hiatal hernia   . Hypercholesteremia   . Hypertension   . Hypothyroid   . Pneumonia 2000s X 1; 05/27/2017  . Seasonal allergies   . Type II diabetes mellitus (HCC)    Assessment: 79 y.o. F presents with SOB. CT shows Large saddle pulmonary embolus with CT evidence of right heart strain (RV/LV ratio of 1.6) consistent with at least submassive (intermediate risk) PE. Pharmacy consulted for heparin. CBC ok on admission. No AC PTA.  Heparin level is at  goal at 0.66 at drip rate 950 units/hr. No bleeding noted. Hemoglobin stable at 10-11. Plt stable in the 200s.  Oncology work-up being done - will continue heparin for now. Will transition to oral anticoagulation after biopsy per NP. Will f/u with MD.  Goal of Therapy:  Heparin level 0.3-0.7 units/ml Monitor platelets by anticoagulation protocol: Yes   Plan:  Continue heparin at 950 units/hr Daily heparin level and CBC Monitor for s/sx bleeding  Corky Crafts PharmD Candidate  Please check [email or web address] for unit-specific pharmacy phone numbers.

## 2020-05-24 NOTE — TOC Progression Note (Signed)
Transition of Care [facility]) - Progression Note    Patient Details  Name: Amber Gray MRN: [id number] Date of Birth: 1940/11/13  Transition of Care [facility]) CM/SW [place], [place] Phone Number: 05/24/2020, 11:27 AM  Clinical Narrative:     CSW started insurance authorization for patient. Start date request is for 8/25. Insurance authorization reference number is [id number]. CSW called [place] and spoke with [place].[facility] is reviewing referral for SNF for patient. Perrin Smack will call CSW back to let her know if she can offer a SNF bed for patient.  Pending bed offer with [facility]. Insurance authorization pending.  CSW will continue to follow.  Expected Discharge Plan: [place] Barriers to Discharge: Continued Medical Work up  Expected Discharge Plan and Services Expected Discharge Plan: [place] arrangements for the past 2 months: Apartment                                       Social Determinants of Health (SDOH) Interventions    Readmission Risk Interventions No flowsheet data found.

## 2020-05-24 NOTE — Sedation Documentation (Signed)
Going to IR for [place] insertion

## 2020-05-24 NOTE — Procedures (Signed)
Pre procedural Dx: Omental mass  Post procedural Dx: Same  Technically successful CT guided biopsy of omental mass/caking within the ventral aspect of the left mid hemi-abdomen.   EBL: None.  Complications: None immediate.   Ronny Bacon, MD Pager #: [phone]

## 2020-05-24 NOTE — Consult Note (Signed)
Chief Complaint: Patient was seen in consultation today for omental mass bx and paracentesis Chief Complaint  Patient presents with  . Shortness of Breath   at the request of Dr Dorian Pod   Supervising Physician: Sandi Mariscal  Patient Status: [facility] - In-pt  History of Present Illness: Amber Gray is a 79 y.o. 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 Thoracentesis 8/21: revealed reactive mesothelial cells  CT 05/21/20: 3. Large amount of peritoneal nodularity, consistent with peritoneal carcinomatosis. 4. Large midline pelvic mass, measuring up to 12.9 x 8.7 x 9.7 cm. 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  Dr Dorian Pod has ordered omental mass biopsy   Imaging reviewed and approved with Dr Pascal Lux   Past Medical History:  Diagnosis Date  . Anxiety   . Arthritis    "all over my body" (05/27/2017)  . Chronic lower back pain   . Depression   . Dyspnea    allergy related SOB  . Frequent UTI   . GERD (gastroesophageal reflux disease)   . Headache    "monthly" (05/27/2017)  . History of blood transfusion    "when I had my neck fusion"  . History of hiatal hernia   . Hypercholesteremia   . Hypertension   . Hypothyroid   . Pneumonia 2000s X 1; 05/27/2017  . Seasonal allergies   . Type II diabetes mellitus ([place])     Past Surgical History:  Procedure Laterality Date  . ANKLE ARTHROSCOPY Right 03/29/2020   Procedure: RIGHT ANKLE ARTHROSCOPY AND DEBRIDEMENT;  Surgeon: Newt Minion, MD;  Location: [place];  Service: Orthopedics;  Laterality: Right;  . BACK SURGERY    . BREAST BIOPSY Left   . CATARACT EXTRACTION W/ INTRAOCULAR LENS  IMPLANT, BILATERAL Bilateral   . COLONOSCOPY  10/25/2005   normal  .  DILATION AND CURETTAGE OF UTERUS    . PARTIAL HYSTERECTOMY  1968  . POSTERIOR CERVICAL FUSION/FORAMINOTOMY     "fused 4,5,6"  . TONSILLECTOMY      Allergies: Metformin and related  Medications: Prior to Admission medications   Medication Sig Start Date End Date Taking? Authorizing Provider  acetaminophen (TYLENOL) 325 MG tablet Take 650 mg by mouth every 6 (six) hours as needed for mild pain.   Yes [provider]  albuterol (PROVENTIL HFA;VENTOLIN HFA) 108 (90 BASE) MCG/ACT inhaler Inhale 2 puffs into the lungs every 6 (six) hours as needed. For wheezing.   Yes [provider]  ALPRAZolam (XANAX) 0.25 MG tablet Take 0.25 mg by mouth daily as needed for anxiety.  06/17/18  Yes [provider]  cetirizine (ZYRTEC) 10 MG tablet Take 10 mg by mouth daily.   Yes [provider]  empagliflozin (JARDIANCE) 10 MG TABS tablet Take 10 mg by mouth daily.   Yes [provider]  furosemide (LASIX) 40 MG tablet Take 40 mg by mouth daily.  04/04/17  Yes [provider]  levothyroxine (SYNTHROID, LEVOTHROID) 75 MCG tablet Take 75 mcg by mouth daily.   Yes [provider]  losartan (COZAAR) 50 MG tablet Take 50 mg by mouth daily.   Yes [provider]  omeprazole (PRILOSEC) 20 MG capsule Take 20 mg by mouth daily. 04/04/17  Yes [provider]  sertraline (ZOLOFT) 50 MG tablet Take 50 mg by mouth daily. 06/17/18  Yes [provider]  simvastatin (ZOCOR) 40 MG tablet Take 40 mg by mouth daily. 05/12/18  Yes [provider]  TRADJENTA 5 MG TABS tablet Take 5 mg by mouth daily. 08/15/18  Yes [provider]  glimepiride (AMARYL) 1 MG tablet Take 0.5 mg by mouth daily. 05/09/17   [provider]  oxyCODONE-acetaminophen (PERCOCET) 5-325 MG tablet Take 1 tablet by mouth every 4 (four) hours as needed for severe pain. Patient not taking: Reported on 05/20/2020 03/29/20 03/29/21  Persons, [place]ely [place], [place]      Family History  Problem Relation Age of Onset  . Alzheimer's disease Mother   . Heart attack Father   . Heart disease Father   . Dwarfism Sister   . Colon cancer Neg Hx   . Colon polyps Neg Hx   . Esophageal cancer Neg Hx   . Rectal cancer Neg Hx   . Stomach cancer Neg Hx     Social History   Socioeconomic History  . Marital status: Divorced    Spouse name: Not on file  . Number of children: Not on file  . Years of education: Not on file  . Highest education level: Not on file  Occupational History  . Not on file  Tobacco Use  . Smoking status: Former Smoker    Packs/day: 1.00    Years: 6.00    Pack years: 6.00    Types: Cigarettes    Quit date: 10/01/1964    Years since quitting: 55.6  . Smokeless tobacco: Never Used  Vaping Use  . Vaping Use: Never used  Substance and Sexual Activity  . Alcohol use: No  . Drug use: No  . Sexual activity: Not Currently  Other Topics Concern  . Not on file  Social History Narrative  . Not on file   Social Determinants of Health   Financial Resource Strain:   . Difficulty of Paying Living Expenses: Not on file  Food Insecurity:   . Worried About Charity fundraiser in the Last Year: Not on file  . Ran Out of Food in the Last Year: Not on file  Transportation Needs:   . Lack of Transportation (Medical): Not on file  . Lack of Transportation (Non-Medical): Not on file  Physical Activity:   . Days of Exercise per Week: Not on file  . Minutes of Exercise per Session: Not on file  Stress:   . Feeling of Stress : Not on file  Social Connections:   . Frequency of Communication with Friends and Family: Not on file  . Frequency of Social Gatherings with Friends and Family: Not on file  . Attends Religious Services: Not on file  . Active Member of Clubs or Organizations: Not on file  . Attends Archivist Meetings: Not on file  . Marital Status: Not on file    Review of Systems: A 12 point ROS discussed and pertinent  positives are indicated in the HPI above.  All other systems are negative.  Review of Systems  Constitutional: Positive for activity change and unexpected weight change. Negative for fatigue and fever.  Respiratory: Positive for shortness of breath.   Cardiovascular: Negative for chest pain.  Gastrointestinal: Positive for abdominal distention, abdominal pain, diarrhea and nausea.  Neurological: Negative for weakness.  Psychiatric/Behavioral: Negative for behavioral problems and confusion.    Vital Signs: BP 129/77   Pulse 65  Temp 98 F (36.7 C) (Oral)   Resp 20   Ht 5' (1.524 m)   Wt 129 lb 6.6 oz (58.7 kg)   SpO2 100%   BMI 25.27 kg/m   Physical Exam Vitals reviewed.  Cardiovascular:     Rate and Rhythm: Normal rate and regular rhythm.  Pulmonary:     Breath sounds: Wheezing present.  Abdominal:     General: Bowel sounds are normal.     Palpations: There is mass.     Tenderness: There is abdominal tenderness.     Comments: distension  Skin:    General: Skin is warm.  Neurological:     Mental Status: She is alert and oriented to person, place, and time.  Psychiatric:        Behavior: Behavior normal.     Imaging: DG Chest 1 View  Result Date: 05/21/2020 CLINICAL DATA:  Post thoracentesis EXAM: CHEST  1 VIEW COMPARISON:  May 20, 2020 FINDINGS: The left-sided pleural effusion is smaller in the interval. No pneumothorax identified. The cardiomediastinal silhouette is stable. No other acute abnormalities. IMPRESSION: No pneumothorax after left thoracentesis. The left effusion is smaller. Electronically Signed   By: Dorise Bullion III M.D   On: 05/21/2020 11:53   DG Chest 2 View  Result Date: 05/20/2020 CLINICAL DATA:  79 year old female with shortness of breath. EXAM: CHEST - 2 VIEW COMPARISON:  Chest radiograph dated 12/04/2019. FINDINGS: Small left pleural effusion and left lung base atelectasis or infiltrate. Clinical correlation and follow-up to resolution  recommended. The right lung is clear. No pneumothorax. Stable cardiac silhouette. Mild bilateral hilar prominence, likely pulmonary hypertension. There is osteopenia with degenerative changes of the spine. No acute osseous pathology. IMPRESSION: Small left pleural effusion and left lung base atelectasis or infiltrate. Electronically Signed   By: Anner Crete M.D.   On: 05/20/2020 15:39   CT Angio Chest PE W and/or Wo Contrast  Result Date: 05/21/2020 CLINICAL DATA:  Abdominal pain and shortness of breath. Recent surgery. EXAM: CT ANGIOGRAPHY CHEST CT ABDOMEN AND PELVIS WITH CONTRAST TECHNIQUE: Multidetector CT imaging of the chest was performed using the standard protocol during bolus administration of intravenous contrast. Multiplanar CT image reconstructions and MIPs were obtained to evaluate the vascular anatomy. Multidetector CT imaging of the abdomen and pelvis was performed using the standard protocol during bolus administration of intravenous contrast. CONTRAST:  55mL OMNIPAQUE IOHEXOL 350 MG/ML SOLN COMPARISON:  None. FINDINGS: CTA CHEST FINDINGS Cardiovascular: Contrast injection is sufficient to demonstrate satisfactory opacification of the pulmonary arteries to the segmental level.There is a saddle pulmonary embolus extending into all proximal lobar branches. There is evidence of right heart strain with an RV to LV ratio of 1.6.the visualized aorta is normal. There is a normal 3-vessel arch branching pattern. Heart size is normal, without pericardial effusion. Mediastinum/Nodes: No mediastinal, hilar or axillary lymphadenopathy. The visualized thyroid and thoracic esophageal course are unremarkable. Lungs/Pleura: Small right and large left pleural effusions. Musculoskeletal: No chest wall abnormality. No acute or significant osseous findings. Review of the MIP images confirms the above findings. CT ABDOMEN and PELVIS FINDINGS Hepatobiliary: Normal hepatic contours and density. No visible biliary  dilatation. Perihepatic ascites. Normal gallbladder. Pancreas: Normal contours without ductal dilatation. No peripancreatic fluid collection. Spleen: Normal. Adrenals/Urinary Tract: --Adrenal glands: Normal. --Right kidney/ureter: No hydronephrosis or perinephric stranding. No nephrolithiasis. No obstructing ureteral stones. --Left kidney/ureter: No hydronephrosis or perinephric stranding. No nephrolithiasis. No obstructing ureteral stones. --Urinary bladder: Unremarkable. Stomach/Bowel: --Stomach/Duodenum: Intermediate sized hiatal hernia. --Small  bowel: No dilatation or inflammation. --Colon: No focal abnormality. --Appendix: Not visualized Vascular/Lymphatic: Normal course and caliber of the major abdominal vessels. No abdominal or pelvic lymphadenopathy. Reproductive: There is a heterogeneous, multilobulated midline pelvic mass that measures 12.9 x 8.7 x 9.7 cm. It is unclear whether some component of this is the uterus. The ovaries are not separately visualized. Musculoskeletal. No bony spinal canal stenosis or focal osseous abnormality. Other: There is a large amount of peritoneal nodularity, particularly in the left upper quadrant. A left anterior abdomen circumscribed fluid collection measures 6.2 x 3.9 cm. IMPRESSION: 1. Large saddle pulmonary embolus with CT evidence of right heart strain (RV/LV ratio of 1.6) consistent with at least submassive (intermediate risk) PE. The presence of right heart strain has been associated with an increased risk of morbidity and mortality. 2. Large left and small right pleural effusions. 3. Large amount of peritoneal nodularity, consistent with peritoneal carcinomatosis. 4. Large midline pelvic mass, measuring up to 12.9 x 8.7 x 9.7 cm. 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 Critical Value/emergent results were called by telephone at the time of interpretation on 05/21/2020 at 1:09 am to  provider [facility], who verbally acknowledged these results. Electronically Signed   By: Ulyses Jarred M.D.   On: 05/21/2020 01:17   CT ABDOMEN PELVIS W CONTRAST  Result Date: 05/21/2020 CLINICAL DATA:  Abdominal pain and shortness of breath. Recent surgery. EXAM: CT ANGIOGRAPHY CHEST CT ABDOMEN AND PELVIS WITH CONTRAST TECHNIQUE: Multidetector CT imaging of the chest was performed using the standard protocol during bolus administration of intravenous contrast. Multiplanar CT image reconstructions and MIPs were obtained to evaluate the vascular anatomy. Multidetector CT imaging of the abdomen and pelvis was performed using the standard protocol during bolus administration of intravenous contrast. CONTRAST:  14mL OMNIPAQUE IOHEXOL 350 MG/ML SOLN COMPARISON:  None. FINDINGS: CTA CHEST FINDINGS Cardiovascular: Contrast injection is sufficient to demonstrate satisfactory opacification of the pulmonary arteries to the segmental level.There is a saddle pulmonary embolus extending into all proximal lobar branches. There is evidence of right heart strain with an RV to LV ratio of 1.6.the visualized aorta is normal. There is a normal 3-vessel arch branching pattern. Heart size is normal, without pericardial effusion. Mediastinum/Nodes: No mediastinal, hilar or axillary lymphadenopathy. The visualized thyroid and thoracic esophageal course are unremarkable. Lungs/Pleura: Small right and large left pleural effusions. Musculoskeletal: No chest wall abnormality. No acute or significant osseous findings. Review of the MIP images confirms the above findings. CT ABDOMEN and PELVIS FINDINGS Hepatobiliary: Normal hepatic contours and density. No visible biliary dilatation. Perihepatic ascites. Normal gallbladder. Pancreas: Normal contours without ductal dilatation. No peripancreatic fluid collection. Spleen: Normal. Adrenals/Urinary Tract: --Adrenal glands: Normal. --Right kidney/ureter: No hydronephrosis or perinephric  stranding. No nephrolithiasis. No obstructing ureteral stones. --Left kidney/ureter: No hydronephrosis or perinephric stranding. No nephrolithiasis. No obstructing ureteral stones. --Urinary bladder: Unremarkable. Stomach/Bowel: --Stomach/Duodenum: Intermediate sized hiatal hernia. --Small bowel: No dilatation or inflammation. --Colon: No focal abnormality. --Appendix: Not visualized Vascular/Lymphatic: Normal course and caliber of the major abdominal vessels. No abdominal or pelvic lymphadenopathy. Reproductive: There is a heterogeneous, multilobulated midline pelvic mass that measures 12.9 x 8.7 x 9.7 cm. It is unclear whether some component of this is the uterus. The ovaries are not separately visualized. Musculoskeletal. No bony spinal canal stenosis or focal osseous abnormality. Other: There is a large amount of peritoneal nodularity, particularly in the left upper quadrant. A left  anterior abdomen circumscribed fluid collection measures 6.2 x 3.9 cm. IMPRESSION: 1. Large saddle pulmonary embolus with CT evidence of right heart strain (RV/LV ratio of 1.6) consistent with at least submassive (intermediate risk) PE. The presence of right heart strain has been associated with an increased risk of morbidity and mortality. 2. Large left and small right pleural effusions. 3. Large amount of peritoneal nodularity, consistent with peritoneal carcinomatosis. 4. Large midline pelvic mass, measuring up to 12.9 x 8.7 x 9.7 cm. 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 Critical Value/emergent results were called by telephone at the time of interpretation on 05/21/2020 at 1:09 am to provider [facility], who verbally acknowledged these results. Electronically Signed   By: Ulyses Jarred M.D.   On: 05/21/2020 01:17   VAS [place] IVC/ILIAC (VENOUS ONLY)  Result Date: 05/21/2020 IVC/ILIAC STUDY Indications: Pulmonary embolism with pelvic mass,  left saphenofemoral junction              DVT Limitations: Air/bowel gas, patient discomfort and anatomic distortion secondary to pelvic mass.  Comparison Study: No prior study Performing Technologist: Maudry Mayhew MHA, RDMS, RVT, RDCS  Examination Guidelines: A complete evaluation includes B-mode imaging, spectral Doppler, color Doppler, and power Doppler as needed of all accessible portions of each vessel. Bilateral testing is considered an integral part of a complete examination. Limited examinations for reoccurring indications may be performed as noted.  +-------------------+---------+-----------+---------+-----------+--------+         CIV        RT-PatentRT-ThrombusLT-PatentLT-ThrombusComments +-------------------+---------+-----------+---------+-----------+--------+ Common Iliac Distal patent              patent                      +-------------------+---------+-----------+---------+-----------+--------+  +------------------------+---------+-----------+---------+-----------+--------+           EIV           RT-PatentRT-ThrombusLT-PatentLT-ThrombusComments +------------------------+---------+-----------+---------+-----------+--------+ External Iliac Vein Prox patent              patent                      +------------------------+---------+-----------+---------+-----------+--------+   Summary: IVC/Iliac: Study was limited. There is no obvious evidence of occlusive thrombus involving the right common iliac vein and left common iliac vein. There is no obvious evidence of occlusive thrombus involving the right external iliac vein and left external iliac vein. Unable to visualize IVC due to technical limitations listed above.  *See table(s) above for measurements and observations.  Electronically signed by Deitra Mayo MD on 05/21/2020 at 3:15:52 PM.   Final    ECHOCARDIOGRAM COMPLETE  Result Date: 05/21/2020    ECHOCARDIOGRAM REPORT   Patient Name:   Amber Gray Date of Exam: 05/21/2020 Medical Rec #:  [id number]           Height:       60.0 in Accession #:    [id number]          Weight:       129.6 lb Date of Birth:  Dec 06, 1940           BSA:          1.552 m Patient Age:    72 years            BP:           118/69 mmHg Patient Gender: F  HR:           69 bpm. Exam Location:  Inpatient Procedure: 2D Echo, Color Doppler, Cardiac Doppler and Intracardiac            Opacification Agent Indications:    Pulmonary embolus  History:        Patient has no prior history of Echocardiogram examinations.                 Signs/Symptoms:Chest Pain and Shortness of Breath; Risk                 Factors:Former Smoker, Diabetes and Hypertension. CKD.  Sonographer:    Clayton Lefort RDCS (AE) Referring Phys: [id number] [place]  1. Left ventricular ejection fraction, by estimation, is 60 to 65%. The left ventricle has normal function. The left ventricle has no regional wall motion abnormalities. There is mild left ventricular hypertrophy. Left ventricular diastolic parameters are consistent with Grade I diastolic dysfunction (impaired relaxation). There is the interventricular septum is flattened in systole and diastole, consistent with right ventricular pressure and volume overload.  2. Right ventricular systolic function is moderately reduced. The right ventricular size is moderately enlarged. There is mildly elevated pulmonary artery systolic pressure.  3. The mitral valve is normal in structure. Trivial mitral valve regurgitation.  4. The aortic valve is normal in structure. Aortic valve regurgitation is trivial. Mild to moderate aortic valve sclerosis/calcification is present, without any evidence of aortic stenosis. FINDINGS  Left Ventricle: Left ventricular ejection fraction, by estimation, is 60 to 65%. The left ventricle has normal function. The left ventricle has no regional wall motion abnormalities. Definity contrast agent was given IV to  delineate the left ventricular  endocardial borders. The left ventricular internal cavity size was normal in size. There is mild left ventricular hypertrophy. The interventricular septum is flattened in systole and diastole, consistent with right ventricular pressure and volume overload. Left ventricular diastolic parameters are consistent with Grade I diastolic dysfunction (impaired relaxation). Right Ventricle: The right ventricular size is moderately enlarged. No increase in right ventricular wall thickness. Right ventricular systolic function is moderately reduced. There is mildly elevated pulmonary artery systolic pressure. The tricuspid regurgitant velocity is 2.92 m/s, and with an assumed right atrial pressure of 10 mmHg, the estimated right ventricular systolic pressure is 44.9 mmHg. Left Atrium: Left atrial size was normal in size. Right Atrium: Right atrial size was not assessed. Pericardium: There is no evidence of pericardial effusion. Mitral Valve: The mitral valve is normal in structure. Trivial mitral valve regurgitation. Tricuspid Valve: The tricuspid valve is normal in structure. Tricuspid valve regurgitation is mild. Aortic Valve: The aortic valve is normal in structure.. There is mild thickening of the aortic valve. Aortic valve regurgitation is trivial. Mild to moderate aortic valve sclerosis/calcification is present, without any evidence of aortic stenosis. There is mild thickening of the aortic valve. Aortic valve mean gradient measures 3.0 mmHg. Aortic valve peak gradient measures 6.1 mmHg. Aortic valve area, by VTI measures 1.84 cm. Pulmonic Valve: The pulmonic valve was grossly normal. Pulmonic valve regurgitation is mild. Aorta: The aortic root and ascending aorta are structurally normal, with no evidence of dilitation. IAS/Shunts: No atrial level shunt detected by color flow Doppler.  LEFT VENTRICLE PLAX 2D LVIDd:         2.70 cm  Diastology LVIDs:         2.00 cm  LV e' lateral:   6.09  cm/s LV PW:  1.30 cm  LV E/e' lateral: 8.0 LV IVS:        1.20 cm  LV e' medial:    4.46 cm/s LVOT diam:     1.65 cm  LV E/e' medial:  10.9 LV SV:         45 LV SV Index:   29 LVOT Area:     2.14 cm  RIGHT VENTRICLE             IVC RV Basal diam:  2.70 cm     IVC diam: 1.60 cm RV S prime:     10.40 cm/s TAPSE (M-mode): 1.8 cm LEFT ATRIUM           Index       RIGHT ATRIUM           Index LA diam:      2.60 cm 1.67 cm/m  RA Area:     13.40 cm LA Vol (A4C): 28.1 ml 18.10 ml/m RA Volume:   35.20 ml  22.67 ml/m  AORTIC VALVE AV Area (Vmax):    1.93 cm AV Area (Vmean):   1.74 cm AV Area (VTI):     1.84 cm AV Vmax:           123.00 cm/s AV Vmean:          79.700 cm/s AV VTI:            0.243 m AV Peak Grad:      6.1 mmHg AV Mean Grad:      3.0 mmHg LVOT Vmax:         111.00 cm/s LVOT Vmean:        64.800 cm/s LVOT VTI:          0.209 m LVOT/AV VTI ratio: 0.86  AORTA Ao Root diam: 3.00 cm Ao Asc diam:  3.10 cm MITRAL VALVE               TRICUSPID VALVE MV Area (PHT): 2.62 cm    TR Peak grad:   34.1 mmHg MV Decel Time: 289 msec    TR Vmax:        292.00 cm/s MV E velocity: 48.80 cm/s MV A velocity: 84.00 cm/s  SHUNTS MV E/A ratio:  0.58        Systemic VTI:  0.21 m                            Systemic Diam: 1.65 cm Glori Bickers MD Electronically signed by Glori Bickers MD Signature Date/Time: 05/21/2020/2:27:06 PM    Final    VAS [place] LOWER EXTREMITY VENOUS (DVT)  Result Date: 05/21/2020  Lower Venous DVTStudy Indications: Pulmonary embolism, and pelvic mass.  Limitations: Poor ultrasound/tissue interface, body habitus and patient position. Comparison Study: No prior study Performing Technologist: Maudry Mayhew MHA, RDMS, RVT, RDCS  Examination Guidelines: A complete evaluation includes B-mode imaging, spectral Doppler, color Doppler, and power Doppler as needed of all accessible portions of each vessel. Bilateral testing is considered an integral part of a complete examination. Limited  examinations for reoccurring indications may be performed as noted. The reflux portion of the exam is performed with the patient in reverse Trendelenburg.  +---------+---------------+---------+-----------+----------+--------------+ RIGHT    CompressibilityPhasicitySpontaneityPropertiesThrombus Aging +---------+---------------+---------+-----------+----------+--------------+ CFV      Full           Yes      Yes                                 +---------+---------------+---------+-----------+----------+--------------+  SFJ      Full                                                        +---------+---------------+---------+-----------+----------+--------------+ FV Prox  Full                                                        +---------+---------------+---------+-----------+----------+--------------+ FV Mid   Full                                                        +---------+---------------+---------+-----------+----------+--------------+ FV DistalFull                                                        +---------+---------------+---------+-----------+----------+--------------+ PFV      Full                                                        +---------+---------------+---------+-----------+----------+--------------+ POP      Full           Yes      Yes                                 +---------+---------------+---------+-----------+----------+--------------+ PTV      Full                                                        +---------+---------------+---------+-----------+----------+--------------+ PERO     Full                                                        +---------+---------------+---------+-----------+----------+--------------+   +-------+---------------+---------+-----------+----------+--------------+ LEFT   CompressibilityPhasicitySpontaneityPropertiesThrombus Aging  +-------+---------------+---------+-----------+----------+--------------+ CFV    Full           No       Yes                                 +-------+---------------+---------+-----------+----------+--------------+ SFJ    None                                         Acute          +-------+---------------+---------+-----------+----------+--------------+  FV ProxFull                                                        +-------+---------------+---------+-----------+----------+--------------+ FV Mid Full                                                        +-------+---------------+---------+-----------+----------+--------------+ PTV    None                    No                   Acute          +-------+---------------+---------+-----------+----------+--------------+ PERO   Full                                                        +-------+---------------+---------+-----------+----------+--------------+ GSV    None                                         Acute at [facility]   +-------+---------------+---------+-----------+----------+--------------+   Left Technical Findings: Not visualized segments include PFV, distal FV, popliteal vein.   Summary: RIGHT: - There is no evidence of deep vein thrombosis in the lower extremity.  - No cystic structure found in the popliteal fossa.  LEFT: - Findings consistent with acute deep vein thrombosis involving the SF junction, and left posterior tibial veins. - No cystic structure found in the popliteal fossa.  *See table(s) above for measurements and observations. Electronically signed by Deitra Mayo MD on 05/21/2020 at 3:16:04 PM.    Final    US THORACENTESIS ASP PLEURAL SPACE W/IMG GUIDE  Result Date: 05/21/2020 INDICATION: Patient with history of dyspnea, large saddle pulmonary embolism, bilateral pleural effusions left greater than right, peritoneal nodularity, pelvic mass; request received for diagnostic and  therapeutic left thoracentesis. EXAM: ULTRASOUND GUIDED DIAGNOSTIC AND THERAPEUTIC LEFT THORACENTESIS MEDICATIONS: 1% lidocaine to skin and subcutaneous tissue COMPLICATIONS: None immediate. PROCEDURE: An ultrasound guided thoracentesis was thoroughly discussed with the patient and questions answered. The benefits, risks, alternatives and complications were also discussed. The patient understands and wishes to proceed with the procedure. Written consent was obtained. Ultrasound was performed to localize and mark an adequate pocket of fluid in the left chest. The area was then prepped and draped in the normal sterile fashion. 1% Lidocaine was used for local anesthesia. Under ultrasound guidance a 6 Fr Safe-T-Centesis catheter was introduced. Thoracentesis was performed. The catheter was removed and a dressing applied. FINDINGS: A total of approximately 580 cc of slightly hazy, amber fluid was removed. Samples were sent to the laboratory as requested by the clinical team. IMPRESSION: Successful ultrasound guided diagnostic and therapeutic left thoracentesis yielding 580 cc of pleural fluid. Read by: Rowe Robert, PA-C Electronically Signed   By: Jacqulynn Cadet M.D.   On: 05/21/2020 11:05    Labs:  CBC: Recent Labs  05/20/20 1458 05/22/20 0349 05/23/20 0722 05/24/20 0619  WBC 9.4 8.0 7.2 6.1  HGB 11.6* 10.3* 11.1* 10.1*  HCT 38.4 33.5* 36.6 32.8*  PLT 249 239 262 235    COAGS: Recent Labs    05/24/20 0619  INR 1.1    BMP: Recent Labs    03/25/20 1514 05/20/20 1458 05/22/20 0349 05/23/20 0722  NA 138 138 136 133*  K 4.0 3.7 3.5 3.7  CL 104 101 105 102  CO2 24 17* 18* 19*  GLUCOSE 158* 187* 111* 114*  BUN 22 33* 27* 21  CALCIUM 9.1 9.3 8.5* 8.6*  CREATININE 1.27* 1.45* 1.27* 1.20*  GFRNONAA 40* 34* 40* 43*  GFRAA 46* 40* 46* 50*    LIVER FUNCTION TESTS: Recent Labs    05/20/20 1458 05/23/20 0722  BILITOT 0.8  --   AST 27  --   ALT 14  --   ALKPHOS 201*  --   PROT  8.2*  --   ALBUMIN 3.1* 2.9*    TUMOR MARKERS: No results for input(s): AFPTM, CEA, CA199, CHROMGRNA in the last 8760 hours.  Assessment and Plan:  Abd pain; bloating Imaging revealing left omental mass; findings suspicious of cancer-- GYN origin Saddle PE and LLE DVT-- on heparin drip Scheduled for paracentesis and left omental mass biopsy Risks and benefits of omental mass biopsy and paracentesis was discussed with the patient and/or patient's family including, but not limited to bleeding, infection, damage to adjacent structures or low yield requiring additional tests.  All of the questions were answered and there is agreement to proceed. Consent signed and in chart.  Thank you for this interesting consult.  I greatly enjoyed meeting Amber Gray and look forward to participating in their care.  A copy of this report was sent to the requesting provider on this date.  Electronically Signed: Lavonia Drafts, PA-C 05/24/2020, 8:10 AM   I spent a total of 20 Minutes    in face to face in clinical consultation, greater than 50% of which was counseling/coordinating care for omental mass bx and paracentesis

## 2020-05-25 LAB — CBC
HCT: 37.5 % (ref 36.0–46.0)
Hemoglobin: 11.5 g/dL — ABNORMAL LOW (ref 12.0–15.0)
MCH: 26.6 pg (ref 26.0–34.0)
MCHC: 30.7 g/dL (ref 30.0–36.0)
MCV: 86.6 fL (ref 80.0–100.0)
Platelets: 253 10*3/uL (ref 150–400)
RBC: 4.33 MIL/uL (ref 3.87–5.11)
RDW: 15.2 % (ref 11.5–15.5)
WBC: 8.4 10*3/uL (ref 4.0–10.5)
nRBC: 0 % (ref 0.0–0.2)

## 2020-05-25 LAB — GLUCOSE, CAPILLARY
Glucose-Capillary: 122 mg/dL — ABNORMAL HIGH (ref 70–99)
Glucose-Capillary: 127 mg/dL — ABNORMAL HIGH (ref 70–99)

## 2020-05-25 LAB — SARS CORONAVIRUS 2 BY RT PCR (HOSPITAL ORDER, PERFORMED IN ~~LOC~~ HOSPITAL LAB): SARS Coronavirus 2: NEGATIVE

## 2020-05-25 LAB — CA 125: Cancer Antigen (CA) 125: 21873 U/mL — ABNORMAL HIGH (ref 0.0–38.1)

## 2020-05-25 LAB — CEA: CEA: 1.1 ng/mL (ref 0.0–4.7)

## 2020-05-25 MED ORDER — APIXABAN 5 MG PO TABS
10.0000 mg | ORAL_TABLET | Freq: Two times a day (BID) | ORAL | Status: DC
  Administered 2020-05-25: 10 mg via ORAL
  Filled 2020-05-25: qty 2

## 2020-05-25 MED ORDER — APIXABAN 5 MG PO TABS
5.0000 mg | ORAL_TABLET | Freq: Two times a day (BID) | ORAL | 0 refills | Status: DC
Start: 2020-06-01 — End: 2020-08-30

## 2020-05-25 MED ORDER — APIXABAN 5 MG PO TABS
10.0000 mg | ORAL_TABLET | Freq: Two times a day (BID) | ORAL | 0 refills | Status: DC
Start: 2020-05-25 — End: 2020-05-30

## 2020-05-25 MED ORDER — APIXABAN 5 MG PO TABS: 5.0000 mg | ORAL_TABLET | Freq: Two times a day (BID) | ORAL | Status: DC

## 2020-05-25 MED ORDER — SODIUM CHLORIDE 0.9% FLUSH: 10.0000 mL | INTRAVENOUS | Status: DC | PRN

## 2020-05-25 MED ORDER — PHENOL 1.4 % MT LIQD
1.0000 | OROMUCOSAL | Status: DC | PRN
  Administered 2020-05-25: 1 via OROMUCOSAL
  Filled 2020-05-25: qty 177

## 2020-05-25 NOTE — TOC Benefit Eligibility Note (Signed)
Transition of Care [facility]) Benefit Eligibility Note    Patient Details  Name: Amber Gray MRN: [id number] Date of Birth: Mar 06, 1941   Medication/Dose: Arne Cleveland  10 MG BID   and   APIXABAN : NON-FORMULARY  Covered?: Yes  Tier: 3 Drug  Prescription Coverage Preferred Pharmacy: [facility]  Spoke with Person/Company/Phone Number:: STEPHANIE  @ [employer] # [phone]  Co-Pay: 25 % OF TOTAL COST  Prior Approval: No  Deductible: Met  Additional Notes: SECONDARY INS: [name]   EFF-DATE 03-02-2019 CO-PAY- $4.00 FOR EACH PRESCRIPTION    Memory Argue Phone Number: 05/25/2020, 12:38 PM

## 2020-05-25 NOTE — Progress Notes (Signed)
2nd attempt to call report, left call back number

## 2020-05-25 NOTE — Progress Notes (Signed)
Amber Gray for Heparin Indication: pulmonary embolus  Allergies  Allergen Reactions  . Metformin And Related Other (See Comments)    Patient Measurements: Height: 5' (152.4 cm) Weight: 58.7 kg (129 lb 6.6 oz) IBW/kg (Calculated) : 45.5  Heparin dosing weight: 57.5 kg  Vital Signs: Temp: 97.7 F (36.5 C) (08/24 2030) Temp Source: Oral (08/24 2030) BP: 123/76 (08/24 2030) Pulse Rate: 79 (08/24 2030)  Labs: Recent Labs    05/22/20 0349 05/22/20 0349 05/23/20 0722 05/24/20 0619  HGB 10.3*   < > 11.1* 10.1*  HCT 33.5*  --  36.6 32.8*  PLT 239  --  262 235  LABPROT  --   --   --  13.8  INR  --   --   --  1.1  HEPARINUNFRC 0.65  --  0.72* 0.66  CREATININE 1.27*  --  1.20*  --    < > = values in this interval not displayed.    Estimated Creatinine Clearance: 30.5 mL/min (A) (by C-G formula based on SCr of 1.2 mg/dL (H)).   Assessment: 79 y.o. F presents with SOB. CT shows Large saddle pulmonary embolus with CT evidence of right heart strain (RV/LV ratio of 1.6) consistent with at least submassive (intermediate risk) PE. Pharmacy consulted for heparin. CBC ok on admission. No AC PTA.  RN called and heparin off since 1200 for biopsy and port placement. She paged MD and he said to restart heparin now.  Goal of Therapy:  Heparin level 0.3-0.7 units/ml Monitor platelets by anticoagulation protocol: Yes   Plan:  Restart heparin at 950 units/hr F/u 8 hr heparin level post restart F/u ability to change to [place], PharmD, BCPS Please see amion for complete clinical pharmacist phone list 05/25/2020 12:06 AM

## 2020-05-25 NOTE — Discharge Instructions (Signed)
Pulmonary Embolism  A pulmonary embolism (PE) is a sudden blockage or decrease of blood flow in one or both lungs. Most blockages come from a blood clot that forms in the vein of a lower leg, thigh, or arm (deep vein thrombosis, DVT) and travels to the lungs. A clot is blood that has thickened into a gel or solid. PE is a dangerous and life-threatening condition that needs to be treated right away. What are the causes? This condition is usually caused by a blood clot that forms in a vein and moves to the lungs. In rare cases, it may be caused by air, fat, part of a tumor, or other tissue that moves through the veins and into the lungs. What increases the risk? The following factors may make you more likely to develop this condition:  Experiencing a traumatic injury, such as breaking a hip or leg.  Having: ? A spinal cord injury. ? Orthopedic surgery, especially hip or knee replacement. ? Any major surgery. ? A stroke. ? DVT. ? Blood clots or blood clotting disease. ? Long-term (chronic) lung or heart disease. ? Cancer treated with chemotherapy. ? A central venous catheter.  Taking medicines that contain estrogen. These include birth control pills and hormone replacement therapy.  Being: ? Pregnant. ? In the period of time after your baby is delivered (postpartum). ? Older than age 60. ? Overweight. ? A smoker, especially if you have other risks. What are the signs or symptoms? Symptoms of this condition usually start suddenly and include:  Shortness of breath during activity or at rest.  Coughing, coughing up blood, or coughing up blood-tinged mucus.  Chest pain that is often worse with deep breaths.  Rapid or irregular heartbeat.  Feeling light-headed or dizzy.  Fainting.  Feeling anxious.  Fever.  Sweating.  Pain and swelling in a leg. This is a symptom of DVT, which can lead to PE. How is this diagnosed? This condition may be diagnosed based on:  Your medical  history.  A physical exam.  Blood tests.  CT pulmonary angiogram. This test checks blood flow in and around your lungs.  Ventilation-perfusion scan, also called a lung VQ scan. This test measures air flow and blood flow to the lungs.  An ultrasound of the legs. How is this treated? Treatment for this condition depends on many factors, such as the cause of your PE, your risk for bleeding or developing more clots, and other medical conditions you have. Treatment aims to remove, dissolve, or stop blood clots from forming or growing larger. Treatment may include:  Medicines, such as: ? Blood thinning medicines (anticoagulants) to stop clots from forming. ? Medicines that dissolve clots (thrombolytics).  Procedures, such as: ? Using a flexible tube to remove a blood clot (embolectomy) or to deliver medicine to destroy it (catheter-directed thrombolysis). ? Inserting a filter into a large vein that carries blood to the heart (inferior vena cava). This filter (vena cava filter) catches blood clots before they reach the lungs. ? Surgery to remove the clot (surgical embolectomy). This is rare. You may need a combination of immediate, long-term (up to 3 months after diagnosis), and extended (more than 3 months after diagnosis) treatments. Your treatment may continue for several months (maintenance therapy). You and your health care provider will work together to choose the treatment program that is best for you. Follow these instructions at home: Medicines  Take over-the-counter and prescription medicines only as told by your health care provider.  If you   are taking an anticoagulant medicine: ? Take the medicine every day at the same time each day. ? Understand what foods and drugs interact with your medicine. ? Understand the side effects of this medicine, including excessive bruising or bleeding. Ask your health care provider or pharmacist about other side effects. General  instructions  Wear a medical alert bracelet or carry a medical alert card that says you have had a PE and lists what medicines you take.  Ask your health care provider when you may return to your normal activities. Avoid sitting or lying for a long time without moving.  Maintain a healthy weight. Ask your health care provider what weight is healthy for you.  Amber Gray not use any products that contain nicotine or tobacco, such as cigarettes, e-cigarettes, and chewing tobacco. If you need help quitting, ask your health care provider.  Talk with your health care provider about any travel plans. It is important to make sure that you are still able to take your medicine while on trips.  Keep all follow-up visits as told by your health care provider. This is important. Contact a health care provider if:  You missed a dose of your blood thinner medicine. Get help right away if:  You have: ? New or increased pain, swelling, warmth, or redness in an arm or leg. ? Numbness or tingling in an arm or leg. ? Shortness of breath during activity or at rest. ? A fever. ? Chest pain. ? A rapid or irregular heartbeat. ? A severe headache. ? Vision changes. ? A serious fall or accident, or you hit your head. ? Stomach (abdominal) pain. ? Blood in your vomit, stool, or urine. ? A cut that will not stop bleeding.  You cough up blood.  You feel light-headed or dizzy.  You cannot move your arms or legs.  You are confused or have memory loss. These symptoms may represent a serious problem that is an emergency. Amber Gray not wait to see if the symptoms will go away. Get medical help right away. Call your local emergency services (911 in the [place] Amber Gray not drive yourself to the hospital. Summary  A pulmonary embolism (PE) is a sudden blockage or decrease of blood flow in one or both lungs. PE is a dangerous and life-threatening condition that needs to be treated right away.  Treatments for this condition usually  include medicines to thin your blood (anticoagulants) or medicines to break apart blood clots (thrombolytics).  If you are given blood thinners, it is important to take the medicine every day at the same time each day.  Understand what foods and drugs interact with any medicines that you are taking.  If you have signs of PE or DVT, call your local emergency services (911 in the [place] This information is not intended to replace advice given to you by your health care provider. Make sure you discuss any questions you have with your health care provider. Document Revised: 06/25/2018 Document Reviewed: 06/25/2018 Elsevier Patient Education  2020 [employer]  

## 2020-05-25 NOTE — Progress Notes (Signed)
Patient's heparin drip stopped during the day for multiple procedures. Spoke with pharmacy and Dr.Opyd regarding mention of possible transition to PO anticoagulation once port implantation was complete. Advised by Dr.Opyd to resume heparin drip for now. IV heparin has been restarted at this time.  Elesa Hacker, RN

## 2020-05-25 NOTE — Discharge Summary (Addendum)
Physician Discharge Summary  Amber Gray GDJ:[id number] DOB: May 12, 1941 DOA: 05/20/2020  PCP: [name], MD  Admit date: 05/20/2020 Discharge date: 05/25/2020  Admitted From: Home Disposition:  SNF   Recommendations for Outpatient Follow-up:  1. Follow up with PCP in 1 week 2. Follow up with gyn oncology regarding biopsy result  Discharge Condition: Stable CODE STATUS: Full  Diet recommendation:  Diet Orders (From admission, onward)    Start     Ordered   05/25/20 0000  Diet - low sodium heart healthy        05/25/20 1046   05/24/20 1728  Diet heart healthy/carb modified Room service appropriate? Yes; Fluid consistency: Thin  Diet effective now       Question Answer Comment  Diet-HS Snack? Nothing   Room service appropriate? Yes   Fluid consistency: Thin      05/24/20 1727          Brief/Interim Summary: Amber Gray is a 79 year old Caucasian female with past medical history significant for right ankle arthroscopy and debridement for impingement and March 29, 2020, hypertension, hyperlipidemia, hypothyroidism, GERD and type 2 diabetes mellitus, who is presenting with complaints of shortness of breath.Patient states she has not been doing well since her recent ankle surgery. She has been very weak and feels dizzy when she tries to walk and thus activity level been low. Patient was admitted with submassive PE with right heart strain and left lower extremity DVT.ICU team was consulted but patient was not deemed a candidate for catheter directed thrombolysis. Patient was treated with IV heparin drip. Shortness of breath is improved. 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 Reports weight loss (152 pounds - > 103 pounds) over last 2 months. Case was discussed with GYN oncology who recommended peritoneal biopsy via IR and also port placement, which were completed on 8/24. Anticoagulation was transitioned from IV Heparin to  Eliquis. Patient was discharged to SNF in stable condition.   Discharge Diagnoses:  Principal Problem:   Acute pulmonary embolism (Amber Gray) Active Problems:   Acute respiratory failure with hypoxia (HCC)   Pleural effusion   AKI (acute kidney injury) (HCC)   Lactic acidosis   Acute hypoxic respiratory failure secondary to acute large saddle pulmonary embolus with CT evidence of right heart strain:PE precipitated by recent ankle surgery/reduced physical activity vs underlying malignancy. GYN oncology consulted.Noted positive [place] deep vein thrombosis involving the SF junction, and left posterior tibial veins. Discussed case with PCCM and as patient is stable and oxygenating well, continuedanticoagulation with heparin recommended. Switched to Eliquis prior to discharge.   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 Large midline pelvic mass, measuring up to 12.9 x 8.7 x 9.7 cm concerning for a primary gynecologic malignancy.  Patient states her last Pap smear was more than 10 years back.  Denies any vaginal bleeding.  Has had significant weight loss (48 pounds) over last 2 months.  Consultedgyn oncology and s/p omental biopsy as well as port placement 8/24. Follow up outpatient with Dr. Berline Lopes.   Large left pleural effusion:Seen on CT along with a small right pleural effusion. Ultrasound-guided diagnostic and therapeuticleftthoracentesis performed yielding 580 ccof slightly hazy, amberfluid with WBC and elevated LDH. Pleural fluid cultures so far negative.  Pleural fluid cytology reported reactive mesothelial cells.   Mild QQI:[id number]. Resume lasix and cozaar.   High anion gap metabolic acidosis:Improved   Elevated alkaline phosphatase:may  be related to the CT pelvis finding.Alk phos elevated at 201, remainder of LFTs normal.Mildly elevated GGT at  59  Hyperlipidemia: Continue Zocor  Hypothyroidism: Continue Synthroid  Non-insulin-dependent type 2 diabetes: Hemoglobin a1c  7.1%.Continue to optimize blood sugar control.Continue home meds jardiance and amaryl and tradjenta   Diarrhea: Stool C. difficile negative.  Per GYN oncology could be related to peritoneal disease-carcinomatous ileus.  Supportive management.   Deconditioning: Patient reports loss of energy and fatigue ever since her ankle surgery. SNF Placement.     Discharge Instructions  Discharge Instructions    Diet - low sodium heart healthy   Complete by: As directed    Increase activity slowly   Complete by: As directed      Allergies as of 05/25/2020      Reactions   Metformin And Related Other (See Comments)      Medication List    STOP taking these medications   ALPRAZolam 0.25 MG tablet Commonly known as: XANAX   oxyCODONE-acetaminophen 5-325 MG tablet Commonly known as: Percocet     TAKE these medications   acetaminophen 325 MG tablet Commonly known as: TYLENOL Take 650 mg by mouth every 6 (six) hours as needed for mild pain.   albuterol 108 (90 Base) MCG/ACT inhaler Commonly known as: VENTOLIN HFA Inhale 2 puffs into the lungs every 6 (six) hours as needed. For wheezing.   apixaban 5 MG Tabs tablet Commonly known as: ELIQUIS Take 2 tablets (10 mg total) by mouth 2 (two) times daily for 7 days.   apixaban 5 MG Tabs tablet Commonly known as: ELIQUIS Take 1 tablet (5 mg total) by mouth 2 (two) times daily. Start taking on: June 01, 2020   cetirizine 10 MG tablet Commonly known as: ZYRTEC Take 10 mg by mouth daily.   furosemide 40 MG tablet Commonly known as: LASIX Take 40 mg by mouth daily.   glimepiride 1 MG tablet Commonly known as: AMARYL Take 0.5 mg by mouth daily.   Jardiance 10 MG Tabs tablet Generic drug: empagliflozin Take 10 mg by mouth daily.   levothyroxine 75 MCG tablet Commonly known as:  SYNTHROID Take 75 mcg by mouth daily.   losartan 50 MG tablet Commonly known as: COZAAR Take 50 mg by mouth daily.   omeprazole 20 MG capsule Commonly known as: PRILOSEC Take 20 mg by mouth daily.   sertraline 50 MG tablet Commonly known as: ZOLOFT Take 50 mg by mouth daily.   simvastatin 40 MG tablet Commonly known as: ZOCOR Take 40 mg by mouth daily.   Tradjenta 5 MG Tabs tablet Generic drug: linagliptin Take 5 mg by mouth daily.       Contact information for follow-up providers    [name], MD. Schedule an appointment as soon as possible for a visit in 1 week(s).   Specialty: Internal Medicine Contact information: [address] [place] [address] [phone]        Lafonda Mosses, MD. Call.   Specialty: Gynecologic Oncology Contact information: [place] [address] [phone]            Contact information for after-discharge care    Destination    [facility] .   Service: Skilled Nursing Contact information: [place] [phone]                 Allergies  Allergen Reactions  . Metformin And Related Other (See Comments)  Consultations:  PCCM  GYN oncology  IR    Procedures/Studies: DG Chest 1 View  Result Date: 05/21/2020 CLINICAL DATA:  Post thoracentesis EXAM: CHEST  1 VIEW COMPARISON:  May 20, 2020 FINDINGS: The left-sided pleural effusion is smaller in the interval. No pneumothorax identified. The cardiomediastinal silhouette is stable. No other acute abnormalities. IMPRESSION: No pneumothorax after left thoracentesis. The left effusion is smaller. Electronically Signed   By: Dorise Bullion III M.D   On: 05/21/2020 11:53   DG Chest 2 View  Result Date: 05/20/2020 CLINICAL DATA:  79 year old female with shortness of breath. EXAM: CHEST - 2 VIEW COMPARISON:  Chest radiograph dated 12/04/2019. FINDINGS: Small  left pleural effusion and left lung base atelectasis or infiltrate. Clinical correlation and follow-up to resolution recommended. The right lung is clear. No pneumothorax. Stable cardiac silhouette. Mild bilateral hilar prominence, likely pulmonary hypertension. There is osteopenia with degenerative changes of the spine. No acute osseous pathology. IMPRESSION: Small left pleural effusion and left lung base atelectasis or infiltrate. Electronically Signed   By: Anner Crete M.D.   On: 05/20/2020 15:39   CT Angio Chest PE W and/or Wo Contrast  Result Date: 05/21/2020 CLINICAL DATA:  Abdominal pain and shortness of breath. Recent surgery. EXAM: CT ANGIOGRAPHY CHEST CT ABDOMEN AND PELVIS WITH CONTRAST TECHNIQUE: Multidetector CT imaging of the chest was performed using the standard protocol during bolus administration of intravenous contrast. Multiplanar CT image reconstructions and MIPs were obtained to evaluate the vascular anatomy. Multidetector CT imaging of the abdomen and pelvis was performed using the standard protocol during bolus administration of intravenous contrast. CONTRAST:  18m OMNIPAQUE IOHEXOL 350 MG/ML SOLN COMPARISON:  None. FINDINGS: CTA CHEST FINDINGS Cardiovascular: Contrast injection is sufficient to demonstrate satisfactory opacification of the pulmonary arteries to the segmental level.There is a saddle pulmonary embolus extending into all proximal lobar branches. There is evidence of right heart strain with an RV to LV ratio of 1.6.the visualized aorta is normal. There is a normal 3-vessel arch branching pattern. Heart size is normal, without pericardial effusion. Mediastinum/Nodes: No mediastinal, hilar or axillary lymphadenopathy. The visualized thyroid and thoracic esophageal course are unremarkable. Lungs/Pleura: Small right and large left pleural effusions. Musculoskeletal: No chest wall abnormality. No acute or significant osseous findings. Review of the MIP images confirms the  above findings. CT ABDOMEN and PELVIS FINDINGS Hepatobiliary: Normal hepatic contours and density. No visible biliary dilatation. Perihepatic ascites. Normal gallbladder. Pancreas: Normal contours without ductal dilatation. No peripancreatic fluid collection. Spleen: Normal. Adrenals/Urinary Tract: --Adrenal glands: Normal. --Right kidney/ureter: No hydronephrosis or perinephric stranding. No nephrolithiasis. No obstructing ureteral stones. --Left kidney/ureter: No hydronephrosis or perinephric stranding. No nephrolithiasis. No obstructing ureteral stones. --Urinary bladder: Unremarkable. Stomach/Bowel: --Stomach/Duodenum: Intermediate sized hiatal hernia. --Small bowel: No dilatation or inflammation. --Colon: No focal abnormality. --Appendix: Not visualized Vascular/Lymphatic: Normal course and caliber of the major abdominal vessels. No abdominal or pelvic lymphadenopathy. Reproductive: There is a heterogeneous, multilobulated midline pelvic mass that measures 12.9 x 8.7 x 9.7 cm. It is unclear whether some component of this is the uterus. The ovaries are not separately visualized. Musculoskeletal. No bony spinal canal stenosis or focal osseous abnormality. Other: There is a large amount of peritoneal nodularity, particularly in the left upper quadrant. A left anterior abdomen circumscribed fluid collection measures 6.2 x 3.9 cm. IMPRESSION: 1. Large saddle pulmonary embolus with CT evidence of right heart strain (RV/LV ratio of 1.6) consistent with at least submassive (intermediate risk) PE. The presence of right heart strain has  been associated with an increased risk of morbidity and mortality. 2. Large left and small right pleural effusions. 3. Large amount of peritoneal nodularity, consistent with peritoneal carcinomatosis. 4. Large midline pelvic mass, measuring up to 12.9 x 8.7 x 9.7 cm. 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  Critical Value/emergent results were called by telephone at the time of interpretation on 05/21/2020 at 1:09 am to provider [facility], who verbally acknowledged these results. Electronically Signed   By: Ulyses Jarred M.D.   On: 05/21/2020 01:17   CT ABDOMEN PELVIS W CONTRAST  Result Date: 05/21/2020 CLINICAL DATA:  Abdominal pain and shortness of breath. Recent surgery. EXAM: CT ANGIOGRAPHY CHEST CT ABDOMEN AND PELVIS WITH CONTRAST TECHNIQUE: Multidetector CT imaging of the chest was performed using the standard protocol during bolus administration of intravenous contrast. Multiplanar CT image reconstructions and MIPs were obtained to evaluate the vascular anatomy. Multidetector CT imaging of the abdomen and pelvis was performed using the standard protocol during bolus administration of intravenous contrast. CONTRAST:  55m OMNIPAQUE IOHEXOL 350 MG/ML SOLN COMPARISON:  None. FINDINGS: CTA CHEST FINDINGS Cardiovascular: Contrast injection is sufficient to demonstrate satisfactory opacification of the pulmonary arteries to the segmental level.There is a saddle pulmonary embolus extending into all proximal lobar branches. There is evidence of right heart strain with an RV to LV ratio of 1.6.the visualized aorta is normal. There is a normal 3-vessel arch branching pattern. Heart size is normal, without pericardial effusion. Mediastinum/Nodes: No mediastinal, hilar or axillary lymphadenopathy. The visualized thyroid and thoracic esophageal course are unremarkable. Lungs/Pleura: Small right and large left pleural effusions. Musculoskeletal: No chest wall abnormality. No acute or significant osseous findings. Review of the MIP images confirms the above findings. CT ABDOMEN and PELVIS FINDINGS Hepatobiliary: Normal hepatic contours and density. No visible biliary dilatation. Perihepatic ascites. Normal gallbladder. Pancreas: Normal contours without ductal dilatation. No peripancreatic fluid collection. Spleen:  Normal. Adrenals/Urinary Tract: --Adrenal glands: Normal. --Right kidney/ureter: No hydronephrosis or perinephric stranding. No nephrolithiasis. No obstructing ureteral stones. --Left kidney/ureter: No hydronephrosis or perinephric stranding. No nephrolithiasis. No obstructing ureteral stones. --Urinary bladder: Unremarkable. Stomach/Bowel: --Stomach/Duodenum: Intermediate sized hiatal hernia. --Small bowel: No dilatation or inflammation. --Colon: No focal abnormality. --Appendix: Not visualized Vascular/Lymphatic: Normal course and caliber of the major abdominal vessels. No abdominal or pelvic lymphadenopathy. Reproductive: There is a heterogeneous, multilobulated midline pelvic mass that measures 12.9 x 8.7 x 9.7 cm. It is unclear whether some component of this is the uterus. The ovaries are not separately visualized. Musculoskeletal. No bony spinal canal stenosis or focal osseous abnormality. Other: There is a large amount of peritoneal nodularity, particularly in the left upper quadrant. A left anterior abdomen circumscribed fluid collection measures 6.2 x 3.9 cm. IMPRESSION: 1. Large saddle pulmonary embolus with CT evidence of right heart strain (RV/LV ratio of 1.6) consistent with at least submassive (intermediate risk) PE. The presence of right heart strain has been associated with an increased risk of morbidity and mortality. 2. Large left and small right pleural effusions. 3. Large amount of peritoneal nodularity, consistent with peritoneal carcinomatosis. 4. Large midline pelvic mass, measuring up to 12.9 x 8.7 x 9.7 cm. 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 Critical Value/emergent results were called by telephone at the time of interpretation on 05/21/2020 at 1:09 am to provider SFranchot [place]idelberg who verbally acknowledged these  results. Electronically Signed   By: Ulyses Jarred M.D.   On: 05/21/2020 01:17   CT  BIOPSY  Result Date: 05/24/2020 INDICATION: No known primary, now with omental caking worrisome for metastatic disease. Please perform CT-guided omental mass/cake biopsy for tissue diagnostic purposes. EXAM: CT-GUIDED OMENTAL MASS BIOPSY COMPARISON:  CT the chest, abdomen and pelvis-05/21/2020 MEDICATIONS: None. ANESTHESIA/SEDATION: Fentanyl 75 mcg IV; Versed 1.5 mg IV Sedation time: 10 minutes; The patient was continuously monitored during the procedure by the interventional radiology nurse under my direct supervision. CONTRAST:  None. COMPLICATIONS: None immediate. PROCEDURE: Informed consent was obtained from the patient following an explanation of the procedure, risks, benefits and alternatives. A time out was performed prior to the initiation of the procedure. The patient was positioned supine on the CT table and a limited CT was performed for procedural planning demonstrating grossly unchanged size and appearance of omental caking, centered within the ventral aspect the left mid hemiabdomen with dominant mixed attenuating component measuring at least 12 x 5.6 cm (image 55, series 2). Note is also made of a small to moderate amount of intra-abdominal ascites. The procedure was planned. The operative site was prepped and draped in the usual sterile fashion. Appropriate trajectory was confirmed with a 22 gauge spinal needle after the adjacent tissues were anesthetized with 1% Lidocaine with epinephrine. Under intermittent CT guidance, a 17 gauge coaxial needle was advanced into the peripheral aspect of the mass. Appropriate positioning was confirmed and 6 core needle biopsy samples were obtained with an 18 gauge core needle biopsy device. The co-axial needle was removed following administration of a Gel-Foam slurry and superficial hemostasis was achieved with manual compression. A limited postprocedural CT was negative for hemorrhage or additional complication. A dressing was placed. The patient tolerated the  procedure well without immediate postprocedural complication. IMPRESSION: Technically successful CT guided core needle biopsy of dominant omental caking within the ventral aspect of the left mid hemiabdomen. Note, patient subsequent underwent image guided paracentesis and Port a catheter placement as dictated separately. Electronically Signed   By: Sandi Mariscal M.D.   On: 05/24/2020 15:59   VAS [place] IVC/ILIAC (VENOUS ONLY)  Result Date: 05/21/2020 IVC/ILIAC STUDY Indications: Pulmonary embolism with pelvic mass, left saphenofemoral junction              DVT Limitations: Air/bowel gas, patient discomfort and anatomic distortion secondary to pelvic mass.  Comparison Study: No prior study Performing Technologist: Maudry Mayhew MHA, RDMS, RVT, RDCS  Examination Guidelines: A complete evaluation includes B-mode imaging, spectral Doppler, color Doppler, and power Doppler as needed of all accessible portions of each vessel. Bilateral testing is considered an integral part of a complete examination. Limited examinations for reoccurring indications may be performed as noted.  +-------------------+---------+-----------+---------+-----------+--------+         CIV        RT-PatentRT-ThrombusLT-PatentLT-ThrombusComments +-------------------+---------+-----------+---------+-----------+--------+ Common Iliac Distal patent              patent                      +-------------------+---------+-----------+---------+-----------+--------+  +------------------------+---------+-----------+---------+-----------+--------+           EIV           RT-PatentRT-ThrombusLT-PatentLT-ThrombusComments +------------------------+---------+-----------+---------+-----------+--------+ External Iliac Vein Prox patent              patent                      +------------------------+---------+-----------+---------+-----------+--------+   Summary: IVC/Iliac:  Study was limited. There is no obvious evidence of occlusive  thrombus involving the right common iliac vein and left common iliac vein. There is no obvious evidence of occlusive thrombus involving the right external iliac vein and left external iliac vein. Unable to visualize IVC due to technical limitations listed above.  *See table(s) above for measurements and observations.  Electronically signed by Deitra Mayo MD on 05/21/2020 at 3:15:52 PM.   Final    ECHOCARDIOGRAM COMPLETE  Result Date: 05/21/2020    ECHOCARDIOGRAM REPORT   Patient Name:   Amber Gray Date of Exam: 05/21/2020 Medical Rec #:  [id number]           Height:       60.0 in Accession #:    [id number]          Weight:       129.6 lb Date of Birth:  06-May-1941           BSA:          1.552 m Patient Age:    79 years            BP:           118/69 mmHg Patient Gender: F                   HR:           69 bpm. Exam Location:  Inpatient Procedure: 2D Echo, Color Doppler, Cardiac Doppler and Intracardiac            Opacification Agent Indications:    Pulmonary embolus  History:        Patient has no prior history of Echocardiogram examinations.                 Signs/Symptoms:Chest Pain and Shortness of Breath; Risk                 Factors:Former Smoker, Diabetes and Hypertension. CKD.  Sonographer:    Clayton Lefort RDCS (AE) Referring Phys: [id number] [place]  1. Left ventricular ejection fraction, by estimation, is 60 to 65%. The left ventricle has normal function. The left ventricle has no regional wall motion abnormalities. There is mild left ventricular hypertrophy. Left ventricular diastolic parameters are consistent with Grade I diastolic dysfunction (impaired relaxation). There is the interventricular septum is flattened in systole and diastole, consistent with right ventricular pressure and volume overload.  2. Right ventricular systolic function is moderately reduced. The right ventricular size is moderately enlarged. There is mildly elevated pulmonary artery systolic  pressure.  3. The mitral valve is normal in structure. Trivial mitral valve regurgitation.  4. The aortic valve is normal in structure. Aortic valve regurgitation is trivial. Mild to moderate aortic valve sclerosis/calcification is present, without any evidence of aortic stenosis. FINDINGS  Left Ventricle: Left ventricular ejection fraction, by estimation, is 60 to 65%. The left ventricle has normal function. The left ventricle has no regional wall motion abnormalities. Definity contrast agent was given IV to delineate the left ventricular  endocardial borders. The left ventricular internal cavity size was normal in size. There is mild left ventricular hypertrophy. The interventricular septum is flattened in systole and diastole, consistent with right ventricular pressure and volume overload. Left ventricular diastolic parameters are consistent with Grade I diastolic dysfunction (impaired relaxation). Right Ventricle: The right ventricular size is moderately enlarged. No increase in right ventricular wall thickness. Right ventricular systolic function is moderately reduced. There is mildly elevated pulmonary artery  systolic pressure. The tricuspid regurgitant velocity is 2.92 m/s, and with an assumed right atrial pressure of 10 mmHg, the estimated right ventricular systolic pressure is 56.4 mmHg. Left Atrium: Left atrial size was normal in size. Right Atrium: Right atrial size was not assessed. Pericardium: There is no evidence of pericardial effusion. Mitral Valve: The mitral valve is normal in structure. Trivial mitral valve regurgitation. Tricuspid Valve: The tricuspid valve is normal in structure. Tricuspid valve regurgitation is mild. Aortic Valve: The aortic valve is normal in structure.. There is mild thickening of the aortic valve. Aortic valve regurgitation is trivial. Mild to moderate aortic valve sclerosis/calcification is present, without any evidence of aortic stenosis. There is mild thickening of the  aortic valve. Aortic valve mean gradient measures 3.0 mmHg. Aortic valve peak gradient measures 6.1 mmHg. Aortic valve area, by VTI measures 1.84 cm. Pulmonic Valve: The pulmonic valve was grossly normal. Pulmonic valve regurgitation is mild. Aorta: The aortic root and ascending aorta are structurally normal, with no evidence of dilitation. IAS/Shunts: No atrial level shunt detected by color flow Doppler.  LEFT VENTRICLE PLAX 2D LVIDd:         2.70 cm  Diastology LVIDs:         2.00 cm  LV e' lateral:   6.09 cm/s LV PW:         1.30 cm  LV E/e' lateral: 8.0 LV IVS:        1.20 cm  LV e' medial:    4.46 cm/s LVOT diam:     1.65 cm  LV E/e' medial:  10.9 LV SV:         45 LV SV Index:   29 LVOT Area:     2.14 cm  RIGHT VENTRICLE             IVC RV Basal diam:  2.70 cm     IVC diam: 1.60 cm RV S prime:     10.40 cm/s TAPSE (M-mode): 1.8 cm LEFT ATRIUM           Index       RIGHT ATRIUM           Index LA diam:      2.60 cm 1.67 cm/m  RA Area:     13.40 cm LA Vol (A4C): 28.1 ml 18.10 ml/m RA Volume:   35.20 ml  22.67 ml/m  AORTIC VALVE AV Area (Vmax):    1.93 cm AV Area (Vmean):   1.74 cm AV Area (VTI):     1.84 cm AV Vmax:           123.00 cm/s AV Vmean:          79.700 cm/s AV VTI:            0.243 m AV Peak Grad:      6.1 mmHg AV Mean Grad:      3.0 mmHg LVOT Vmax:         111.00 cm/s LVOT Vmean:        64.800 cm/s LVOT VTI:          0.209 m LVOT/AV VTI ratio: 0.86  AORTA Ao Root diam: 3.00 cm Ao Asc diam:  3.10 cm MITRAL VALVE               TRICUSPID VALVE MV Area (PHT): 2.62 cm    TR Peak grad:   34.1 mmHg MV Decel Time: 289 msec    TR Vmax:  292.00 cm/s MV E velocity: 48.80 cm/s MV A velocity: 84.00 cm/s  SHUNTS MV E/A ratio:  0.58        Systemic VTI:  0.21 m                            Systemic Diam: 1.65 cm Glori Bickers MD Electronically signed by Glori Bickers MD Signature Date/Time: 05/21/2020/2:27:06 PM    Final    IR IMAGING GUIDED PORT INSERTION  Result Date: 05/24/2020 INDICATION:  No known primary, now with omental caking worrisome for metastatic disease. Please perform Port a catheter placement for the acquisition of durable intravenous access for chemotherapy administration. Patient also with symptomatic presumably malignant ascites. Please perform ultrasound-guided paracentesis for therapeutic purposes. EXAM: 1. IMPLANTED PORT A CATH PLACEMENT WITH ULTRASOUND AND FLUOROSCOPIC GUIDANCE 2. ULTRASOUND-GUIDED PARACENTESIS COMPARISON:  CT of the chest, abdomen pelvis-05/21/2020 MEDICATIONS: The patient is currently admitted to the hospital receiving intravenous antibiotics; The antibiotic was administered within an appropriate time interval prior to skin puncture. ANESTHESIA/SEDATION: None (patient remained sedated from medication administered during preceding CT-guided omental mass biopsy and did not require additional medication for Port a catheter placement). CONTRAST:  None FLUOROSCOPY TIME:  18 seconds (2 mGy) COMPLICATIONS: None immediate. PROCEDURE: The procedure, risks, benefits, and alternatives were explained to the patient. Questions regarding the procedure were encouraged and answered. The patient understands and consents to the procedure. Attention was initially paid to the paracentesis portion of the procedure. Sonographic evaluation demonstrates a moderate amount of intra-abdominal ascites, centered within the right lower abdomen. As such, the skin overlying the ventral aspect of the right lower abdomen was prepped and draped in usual sterile fashion. After the overlying soft tissues were anesthetized with 1% lidocaine with epinephrine, a Safe-T-Centesis catheter was inserted under direct ultrasound guidance. Next, paracentesis was performed ultimately yielding 2.2 L of serous slightly blood tinged peritoneal fluid. Attention was now paid towards placement of the [facility] a Catheter. The right neck and chest were prepped with chlorhexidine in a sterile fashion, and a sterile drape  was applied covering the operative field. Maximum barrier sterile technique with sterile gowns and gloves were used for the procedure. A timeout was performed prior to the initiation of the procedure. Local anesthesia was provided with 1% lidocaine with epinephrine. After creating a small venotomy incision, a micropuncture kit was utilized to access the internal jugular vein. Real-time ultrasound guidance was utilized for vascular access including the acquisition of a permanent ultrasound image documenting patency of the accessed vessel. The microwire was utilized to measure appropriate catheter length. A subcutaneous port pocket was then created along the upper chest wall utilizing a combination of sharp and blunt dissection. The pocket was irrigated with sterile saline. A single lumen "Slim" sized power injectable port was chosen for placement. The 8 Fr catheter was tunneled from the port pocket site to the venotomy incision. The port was placed in the pocket. The external catheter was trimmed to appropriate length. At the venotomy, an 8 Fr peel-away sheath was placed over a guidewire under fluoroscopic guidance. The catheter was then placed through the sheath and the sheath was removed. Final catheter positioning was confirmed and documented with a fluoroscopic spot radiograph. The port was accessed with a Huber needle, aspirated and flushed with heparinized saline. The venotomy site was closed with an interrupted 4-0 Vicryl suture. The port pocket incision was closed with interrupted 2-0 Vicryl suture. The skin was opposed with a  running subcuticular 4-0 Vicryl suture. Dermabond and Steri-strips were applied to both incisions. Dressings were applied. The patient tolerated the procedure well without immediate post procedural complication. FINDINGS: Sonographic evaluation demonstrates a moderate amount of intra-abdominal ascites. Approximately 2.2 L of serous slightly blood tinged ascitic fluid following  ultrasound-guided paracentesis. After Port a catheter placement, the tip lies within the superior cavoatrial junction. The catheter aspirates and flushes normally and is ready for immediate use. IMPRESSION: 1. Successful placement of a right internal jugular approach power injectable Port-A-Cath. The catheter is ready for immediate use. 2. Successful ultrasound-guided paracentesis yielding 2.2 L of serous, slightly blood tinged ascitic fluid. Electronically Signed   By: Sandi Mariscal M.D.   On: 05/24/2020 16:48   VAS [place] LOWER EXTREMITY VENOUS (DVT)  Result Date: 05/21/2020  Lower Venous DVTStudy Indications: Pulmonary embolism, and pelvic mass.  Limitations: Poor ultrasound/tissue interface, body habitus and patient position. Comparison Study: No prior study Performing Technologist: Maudry Mayhew MHA, RDMS, RVT, RDCS  Examination Guidelines: A complete evaluation includes B-mode imaging, spectral Doppler, color Doppler, and power Doppler as needed of all accessible portions of each vessel. Bilateral testing is considered an integral part of a complete examination. Limited examinations for reoccurring indications may be performed as noted. The reflux portion of the exam is performed with the patient in reverse Trendelenburg.  +---------+---------------+---------+-----------+----------+--------------+ RIGHT    CompressibilityPhasicitySpontaneityPropertiesThrombus Aging +---------+---------------+---------+-----------+----------+--------------+ CFV      Full           Yes      Yes                                 +---------+---------------+---------+-----------+----------+--------------+ SFJ      Full                                                        +---------+---------------+---------+-----------+----------+--------------+ FV Prox  Full                                                        +---------+---------------+---------+-----------+----------+--------------+ FV Mid    Full                                                        +---------+---------------+---------+-----------+----------+--------------+ FV DistalFull                                                        +---------+---------------+---------+-----------+----------+--------------+ PFV      Full                                                        +---------+---------------+---------+-----------+----------+--------------+  POP      Full           Yes      Yes                                 +---------+---------------+---------+-----------+----------+--------------+ PTV      Full                                                        +---------+---------------+---------+-----------+----------+--------------+ PERO     Full                                                        +---------+---------------+---------+-----------+----------+--------------+   +-------+---------------+---------+-----------+----------+--------------+ LEFT   CompressibilityPhasicitySpontaneityPropertiesThrombus Aging +-------+---------------+---------+-----------+----------+--------------+ CFV    Full           No       Yes                                 +-------+---------------+---------+-----------+----------+--------------+ SFJ    None                                         Acute          +-------+---------------+---------+-----------+----------+--------------+ FV ProxFull                                                        +-------+---------------+---------+-----------+----------+--------------+ FV Mid Full                                                        +-------+---------------+---------+-----------+----------+--------------+ PTV    None                    No                   Acute          +-------+---------------+---------+-----------+----------+--------------+ PERO   Full                                                         +-------+---------------+---------+-----------+----------+--------------+ GSV    None                                         Acute at [facility]   +-------+---------------+---------+-----------+----------+--------------+   Left Technical Findings: Not visualized segments include PFV, distal FV, popliteal vein.  Summary: RIGHT: - There is no evidence of deep vein thrombosis in the lower extremity.  - No cystic structure found in the popliteal fossa.  LEFT: - Findings consistent with acute deep vein thrombosis involving the SF junction, and left posterior tibial veins. - No cystic structure found in the popliteal fossa.  *See table(s) above for measurements and observations. Electronically signed by Deitra Mayo MD on 05/21/2020 at 3:16:04 PM.    Final    IR Paracentesis  Result Date: 05/24/2020 INDICATION: No known primary, now with omental caking worrisome for metastatic disease. Please perform Port a catheter placement for the acquisition of durable intravenous access for chemotherapy administration. Patient also with symptomatic presumably malignant ascites. Please perform ultrasound-guided paracentesis for therapeutic purposes. EXAM: 1. IMPLANTED PORT A CATH PLACEMENT WITH ULTRASOUND AND FLUOROSCOPIC GUIDANCE 2. ULTRASOUND-GUIDED PARACENTESIS COMPARISON:  CT of the chest, abdomen pelvis-05/21/2020 MEDICATIONS: The patient is currently admitted to the hospital receiving intravenous antibiotics; The antibiotic was administered within an appropriate time interval prior to skin puncture. ANESTHESIA/SEDATION: None (patient remained sedated from medication administered during preceding CT-guided omental mass biopsy and did not require additional medication for Port a catheter placement). CONTRAST:  None FLUOROSCOPY TIME:  18 seconds (2 mGy) COMPLICATIONS: None immediate. PROCEDURE: The procedure, risks, benefits, and alternatives were explained to the patient. Questions regarding the procedure were  encouraged and answered. The patient understands and consents to the procedure. Attention was initially paid to the paracentesis portion of the procedure. Sonographic evaluation demonstrates a moderate amount of intra-abdominal ascites, centered within the right lower abdomen. As such, the skin overlying the ventral aspect of the right lower abdomen was prepped and draped in usual sterile fashion. After the overlying soft tissues were anesthetized with 1% lidocaine with epinephrine, a Safe-T-Centesis catheter was inserted under direct ultrasound guidance. Next, paracentesis was performed ultimately yielding 2.2 L of serous slightly blood tinged peritoneal fluid. Attention was now paid towards placement of the [facility] a Catheter. The right neck and chest were prepped with chlorhexidine in a sterile fashion, and a sterile drape was applied covering the operative field. Maximum barrier sterile technique with sterile gowns and gloves were used for the procedure. A timeout was performed prior to the initiation of the procedure. Local anesthesia was provided with 1% lidocaine with epinephrine. After creating a small venotomy incision, a micropuncture kit was utilized to access the internal jugular vein. Real-time ultrasound guidance was utilized for vascular access including the acquisition of a permanent ultrasound image documenting patency of the accessed vessel. The microwire was utilized to measure appropriate catheter length. A subcutaneous port pocket was then created along the upper chest wall utilizing a combination of sharp and blunt dissection. The pocket was irrigated with sterile saline. A single lumen "Slim" sized power injectable port was chosen for placement. The 8 Fr catheter was tunneled from the port pocket site to the venotomy incision. The port was placed in the pocket. The external catheter was trimmed to appropriate length. At the venotomy, an 8 Fr peel-away sheath was placed over a guidewire under  fluoroscopic guidance. The catheter was then placed through the sheath and the sheath was removed. Final catheter positioning was confirmed and documented with a fluoroscopic spot radiograph. The port was accessed with a Huber needle, aspirated and flushed with heparinized saline. The venotomy site was closed with an interrupted 4-0 Vicryl suture. The port pocket incision was closed with interrupted 2-0 Vicryl suture. The skin was opposed with a running subcuticular 4-0 Vicryl suture. Dermabond  and Steri-strips were applied to both incisions. Dressings were applied. The patient tolerated the procedure well without immediate post procedural complication. FINDINGS: Sonographic evaluation demonstrates a moderate amount of intra-abdominal ascites. Approximately 2.2 L of serous slightly blood tinged ascitic fluid following ultrasound-guided paracentesis. After Port a catheter placement, the tip lies within the superior cavoatrial junction. The catheter aspirates and flushes normally and is ready for immediate use. IMPRESSION: 1. Successful placement of a right internal jugular approach power injectable Port-A-Cath. The catheter is ready for immediate use. 2. Successful ultrasound-guided paracentesis yielding 2.2 L of serous, slightly blood tinged ascitic fluid. Electronically Signed   By: Sandi Mariscal M.D.   On: 05/24/2020 16:48   US THORACENTESIS ASP PLEURAL SPACE W/IMG GUIDE  Result Date: 05/21/2020 INDICATION: Patient with history of dyspnea, large saddle pulmonary embolism, bilateral pleural effusions left greater than right, peritoneal nodularity, pelvic mass; request received for diagnostic and therapeutic left thoracentesis. EXAM: ULTRASOUND GUIDED DIAGNOSTIC AND THERAPEUTIC LEFT THORACENTESIS MEDICATIONS: 1% lidocaine to skin and subcutaneous tissue COMPLICATIONS: None immediate. PROCEDURE: An ultrasound guided thoracentesis was thoroughly discussed with the patient and questions answered. The benefits, risks,  alternatives and complications were also discussed. The patient understands and wishes to proceed with the procedure. Written consent was obtained. Ultrasound was performed to localize and mark an adequate pocket of fluid in the left chest. The area was then prepped and draped in the normal sterile fashion. 1% Lidocaine was used for local anesthesia. Under ultrasound guidance a 6 Fr Safe-T-Centesis catheter was introduced. Thoracentesis was performed. The catheter was removed and a dressing applied. FINDINGS: A total of approximately 580 cc of slightly hazy, amber fluid was removed. Samples were sent to the laboratory as requested by the clinical team. IMPRESSION: Successful ultrasound guided diagnostic and therapeutic left thoracentesis yielding 580 cc of pleural fluid. Read by: Rowe Robert, PA-C Electronically Signed   By: Jacqulynn Cadet M.D.   On: 05/21/2020 11:05       Discharge Exam: Vitals:   05/25/20 0546 05/25/20 0925  BP: 108/68   Pulse: 71   Resp: 18   Temp: (!) 97.4 F (36.3 C)   SpO2: 99% 99%    General: Pt is alert, awake, not in acute distress Cardiovascular: RRR, S1/S2 +, trace edema left ankle  Respiratory: CTA bilaterally, no wheezing, no rhonchi, no respiratory distress, no conversational dyspnea, on 2L [place] O2  Abdominal: Soft, NT, mildly distended, bowel sounds + Extremities: trace edema left ankle, no cyanosis Psych: Normal mood and affect, stable judgement and insight     The results of significant diagnostics from this hospitalization (including imaging, microbiology, ancillary and laboratory) are listed below for reference.     Microbiology: Recent Results (from the past 240 hour(s))  SARS Coronavirus 2 by RT PCR (hospital order, performed in [facility] hospital lab) Nasopharyngeal Nasopharyngeal Swab     Status: None   Collection Time: 05/20/20  8:27 PM   Specimen: Nasopharyngeal Swab  Result Value Ref Range Status   SARS Coronavirus 2 NEGATIVE NEGATIVE  Final    Comment: (NOTE) SARS-CoV-2 target nucleic acids are NOT DETECTED.  The SARS-CoV-2 RNA is generally detectable in upper and lower respiratory specimens during the acute phase of infection. The lowest concentration of SARS-CoV-2 viral copies this assay can detect is 250 copies / mL. A negative result does not preclude SARS-CoV-2 infection and should not be used as the sole basis for treatment or other patient management decisions.  A negative result may occur with improper  specimen collection / handling, submission of specimen other than nasopharyngeal swab, presence of viral mutation(s) within the areas targeted by this assay, and inadequate number of viral copies (<250 copies / mL). A negative result must be combined with clinical observations, patient history, and epidemiological information.  Fact Sheet for Patients:   StrictlyIdeas.no  Fact Sheet for Healthcare Providers: BankingDealers.co.za  This test is not yet approved or  cleared by the [place] FDA and has been authorized for detection and/or diagnosis of SARS-CoV-2 by FDA under an Emergency Use Authorization (EUA).  This EUA will remain in effect (meaning this test can be used) for the duration of the COVID-19 declaration under Section 564(b)(1) of the Act, 21 U.S.C. section 360bbb-3(b)(1), unless the authorization is terminated or revoked sooner.  Performed at [facility], [place] [address], [place], [place] [address]   Gram stain     Status: None   Collection Time: 05/21/20 11:18 AM   Specimen: PATH Cytology Pleural fluid  Result Value Ref Range Status   Specimen Description CYTO PLEU  Final   Special Requests NONE  Final   Gram Stain   Final    WBC PRESENT,BOTH PMN AND MONONUCLEAR NO ORGANISMS SEEN CYTOSPIN SMEAR Performed at [facility], [address], [place], [place] [address]    Report Status 05/21/2020 FINAL  Final  Culture, body  fluid-bottle     Status: None (Preliminary result)   Collection Time: 05/21/20 11:18 AM   Specimen: Pleura  Result Value Ref Range Status   Specimen Description PLEURAL FLUID  Final   Special Requests NONE  Final   Culture   Final    NO GROWTH 4 DAYS Performed at [place] [address], [place], [place] [address]    Report Status PENDING  Incomplete  C Difficile Quick Screen w PCR reflex     Status: None   Collection Time: 05/22/20  7:51 PM   Specimen: STOOL  Result Value Ref Range Status   C Diff antigen NEGATIVE NEGATIVE Final   C Diff toxin NEGATIVE NEGATIVE Final   C Diff interpretation No C. difficile detected.  Final    Comment: Performed at [facility], [place] [address], [place], [place] [address]  SARS Coronavirus 2 by RT PCR (hospital order, performed in [facility] hospital lab) Nasopharyngeal Nasopharyngeal Swab     Status: None   Collection Time: 05/24/20  6:01 AM   Specimen: Nasopharyngeal Swab  Result Value Ref Range Status   SARS Coronavirus 2 NEGATIVE NEGATIVE Final    Comment: (NOTE) SARS-CoV-2 target nucleic acids are NOT DETECTED.  The SARS-CoV-2 RNA is generally detectable in upper and lower respiratory specimens during the acute phase of infection. The lowest concentration of SARS-CoV-2 viral copies this assay can detect is 250 copies / mL. A negative result does not preclude SARS-CoV-2 infection and should not be used as the sole basis for treatment or other patient management decisions.  A negative result may occur with improper specimen collection / handling, submission of specimen other than nasopharyngeal swab, presence of viral mutation(s) within the areas targeted by this assay, and inadequate number of viral copies (<250 copies / mL). A negative result must be combined with clinical observations, patient history, and epidemiological information.  Fact Sheet for Patients:   StrictlyIdeas.no  Fact Sheet for  Healthcare Providers: BankingDealers.co.za  This test is not yet approved or  cleared by the [place] FDA and has been authorized for  detection and/or diagnosis of SARS-CoV-2 by FDA under an Emergency Use Authorization (EUA).  This EUA will remain in effect (meaning this test can be used) for the duration of the COVID-19 declaration under Section 564(b)(1) of the Act, 21 U.S.C. section 360bbb-3(b)(1), unless the authorization is terminated or revoked sooner.  Performed at [facility], [place] [address], [place], [place] [address]      Labs: BNP (last 3 results) Recent Labs    05/20/20 2120 05/21/20 0247 05/21/20 1132  BNP 577.4* 484.4* 101.7*   Basic Metabolic Panel: Recent Labs  Lab 05/20/20 1458 05/22/20 0349 05/22/20 1109 05/23/20 0722  NA 138 136  --  133*  K 3.7 3.5  --  3.7  CL 101 105  --  102  CO2 17* 18*  --  19*  GLUCOSE 187* 111*  --  114*  BUN 33* 27*  --  21  CREATININE 1.45* 1.27*  --  1.20*  CALCIUM 9.3 8.5*  --  8.6*  MG  --   --  2.1  --   PHOS  --   --   --  3.1   Liver Function Tests: Recent Labs  Lab 05/20/20 1458 05/23/20 0722  AST 27  --   ALT 14  --   ALKPHOS 201*  --   BILITOT 0.8  --   PROT 8.2*  --   ALBUMIN 3.1* 2.9*   No results for input(s): LIPASE, AMYLASE in the last 168 hours. No results for input(s): AMMONIA in the last 168 hours. CBC: Recent Labs  Lab 05/20/20 1458 05/22/20 0349 05/23/20 0722 05/24/20 0619 05/25/20 0843  WBC 9.4 8.0 7.2 6.1 8.4  NEUTROABS 7.6  --   --   --   --   HGB 11.6* 10.3* 11.1* 10.1* 11.5*  HCT 38.4 33.5* 36.6 32.8* 37.5  MCV 87.9 86.6 87.1 87.5 86.6  PLT 249 239 262 235 253   Cardiac Enzymes: No results for input(s): CKTOTAL, CKMB, CKMBINDEX, TROPONINI in the last 168 hours. BNP: Invalid input(s): POCBNP CBG: Recent Labs  Lab 05/24/20 0735 05/24/20 0837 05/24/20 1154 05/24/20 1721 05/25/20 0739  GLUCAP 109* 118* 100* 111* 122*   D-Dimer No  results for input(s): DDIMER in the last 72 hours. Hgb A1c No results for input(s): HGBA1C in the last 72 hours. Lipid Profile No results for input(s): CHOL, HDL, LDLCALC, TRIG, CHOLHDL, LDLDIRECT in the last 72 hours. Thyroid function studies No results for input(s): TSH, T4TOTAL, T3FREE, THYROIDAB in the last 72 hours.  Invalid input(s): FREET3 Anemia work up No results for input(s): VITAMINB12, FOLATE, FERRITIN, TIBC, IRON, RETICCTPCT in the last 72 hours. Urinalysis    Component Value Date/Time   COLORURINE STRAW (A) 05/27/2017 0627   APPEARANCEUR CLEAR 05/27/2017 0627   LABSPEC 1.011 05/27/2017 0627   PHURINE 6.0 05/27/2017 0627   GLUCOSEU >=500 (A) 05/27/2017 0627   HGBUR NEGATIVE 05/27/2017 0627   BILIRUBINUR NEGATIVE 05/27/2017 0627   KETONESUR NEGATIVE 05/27/2017 0627   PROTEINUR NEGATIVE 05/27/2017 0627   UROBILINOGEN 1.0 09/25/2012 1352   NITRITE NEGATIVE 05/27/2017 0627   LEUKOCYTESUR NEGATIVE 05/27/2017 5102   Sepsis Labs Invalid input(s): PROCALCITONIN,  WBC,  LACTICIDVEN Microbiology Recent Results (from the past 240 hour(s))  SARS Coronavirus 2 by RT PCR (hospital order, performed in [place] hospital lab) Nasopharyngeal Nasopharyngeal Swab     Status: None   Collection Time: 05/20/20  8:27 PM   Specimen: Nasopharyngeal Swab  Result Value Ref Range Status  SARS Coronavirus 2 NEGATIVE NEGATIVE Final    Comment: (NOTE) SARS-CoV-2 target nucleic acids are NOT DETECTED.  The SARS-CoV-2 RNA is generally detectable in upper and lower respiratory specimens during the acute phase of infection. The lowest concentration of SARS-CoV-2 viral copies this assay can detect is 250 copies / mL. A negative result does not preclude SARS-CoV-2 infection and should not be used as the sole basis for treatment or other patient management decisions.  A negative result may occur with improper specimen collection / handling, submission of specimen other than nasopharyngeal  swab, presence of viral mutation(s) within the areas targeted by this assay, and inadequate number of viral copies (<250 copies / mL). A negative result must be combined with clinical observations, patient history, and epidemiological information.  Fact Sheet for Patients:   StrictlyIdeas.no  Fact Sheet for Healthcare Providers: BankingDealers.co.za  This test is not yet approved or  cleared by the [place] FDA and has been authorized for detection and/or diagnosis of SARS-CoV-2 by FDA under an Emergency Use Authorization (EUA).  This EUA will remain in effect (meaning this test can be used) for the duration of the COVID-19 declaration under Section 564(b)(1) of the Act, 21 U.S.C. section 360bbb-3(b)(1), unless the authorization is terminated or revoked sooner.  Performed at [facility], [place] [address], [place], [place] [address]   Gram stain     Status: None   Collection Time: 05/21/20 11:18 AM   Specimen: PATH Cytology Pleural fluid  Result Value Ref Range Status   Specimen Description CYTO PLEU  Final   Special Requests NONE  Final   Gram Stain   Final    WBC PRESENT,BOTH PMN AND MONONUCLEAR NO ORGANISMS SEEN CYTOSPIN SMEAR Performed at [facility], [address], [place], [place] [address]    Report Status 05/21/2020 FINAL  Final  Culture, body fluid-bottle     Status: None (Preliminary result)   Collection Time: 05/21/20 11:18 AM   Specimen: Pleura  Result Value Ref Range Status   Specimen Description PLEURAL FLUID  Final   Special Requests NONE  Final   Culture   Final    NO GROWTH 4 DAYS Performed at [place] [address] [place], [place] [address]    Report Status PENDING  Incomplete  C Difficile Quick Screen w PCR reflex     Status: None   Collection Time: 05/22/20  7:51 PM   Specimen: STOOL  Result Value Ref Range Status   C Diff antigen NEGATIVE NEGATIVE Final   C Diff toxin  NEGATIVE NEGATIVE Final   C Diff interpretation No C. difficile detected.  Final    Comment: Performed at [facility], [place] [address], [place],  [address]  SARS Coronavirus 2 by RT PCR (hospital order, performed in [facility] hospital lab) Nasopharyngeal Nasopharyngeal Swab     Status: None   Collection Time: 05/24/20  6:01 AM   Specimen: Nasopharyngeal Swab  Result Value Ref Range Status   SARS Coronavirus 2 NEGATIVE NEGATIVE Final    Comment: (NOTE) SARS-CoV-2 target nucleic acids are NOT DETECTED.  The SARS-CoV-2 RNA is generally detectable in upper and lower respiratory specimens during the acute phase of infection. The lowest concentration of SARS-CoV-2 viral copies this assay can detect is 250 copies / mL. A negative result does not preclude SARS-CoV-2 infection and should not be used as the sole basis for treatment or other patient management decisions.  A negative result  may occur with improper specimen collection / handling, submission of specimen other than nasopharyngeal swab, presence of viral mutation(s) within the areas targeted by this assay, and inadequate number of viral copies (<250 copies / mL). A negative result must be combined with clinical observations, patient history, and epidemiological information.  Fact Sheet for Patients:   StrictlyIdeas.no  Fact Sheet for Healthcare Providers: BankingDealers.co.za  This test is not yet approved or  cleared by the [place] FDA and has been authorized for detection and/or diagnosis of SARS-CoV-2 by FDA under an Emergency Use Authorization (EUA).  This EUA will remain in effect (meaning this test can be used) for the duration of the COVID-19 declaration under Section 564(b)(1) of the Act, 21 U.S.C. section 360bbb-3(b)(1), unless the authorization is terminated or revoked sooner.  Performed at [facility], [place] [address] [place],  [place] [address]      Patient was seen and examined on the day of discharge and was found to be in stable condition. Time coordinating discharge: 40 minutes including assessment and coordination of care, as well as examination of the patient.   SIGNED:  Dessa Phi, DO [facility] 05/25/2020, 10:48 AM

## 2020-05-25 NOTE — Progress Notes (Signed)
Report given to [facility] RN.

## 2020-05-25 NOTE — Care Management (Signed)
05-25-20 1116 Benefits check submitted for Eliquis. Case Manager will follow for cost. Graves-Bigelow, Ocie Cornfield, RN,BSN Case Manager

## 2020-05-25 NOTE — TOC Transition Note (Signed)
Transition of Care [facility]) - CM/SW Discharge Note   Patient Details  Name: [name] DOBLER MRN: [id number] Date of Birth: 1940/10/25  [facility] CM/SW Contact:  Trula Ore, [place] Phone Number: 05/25/2020, 11:27 AM   Clinical Narrative:     Patient will DC to: [facility]   Anticipated DC date: 05/25/2020  Family notified: Blanch Media  Transport by: Corey Harold  ?  Per MD patient ready for DC to [facility] . RN, patient, patient's family, and facility notified of DC. Discharge Summary sent to facility. RN given number for report tele#[phone] [phone]. DC packet on chart. Ambulance transport requested for patient.  CSW signing off.  Final next level of care: Skilled Nursing Facility Barriers to Discharge: No Barriers Identified   Patient Goals and CMS Choice Patient states their goals for this hospitalization and ongoing recovery are:: to go to SNF CMS Medicare.gov Compare Post Acute Care list provided to:: Patient Choice offered to / list presented to : Patient  Discharge Placement              Patient chooses bed at: [facility] Patient to be transferred to facility by: Phil Campbell Name of family member notified: Blanch Media Patient and family notified of of transfer: 05/25/20  Discharge Plan and Services                                     Social Determinants of Health (SDOH) Interventions     Readmission Risk Interventions No flowsheet data found.

## 2020-05-25 NOTE — Progress Notes (Signed)
Pt's stable, DC to [facility] via [facility]

## 2020-05-25 NOTE — Progress Notes (Signed)
1st attempt to call report to [facility], no answer. Will try again later

## 2020-05-25 NOTE — Progress Notes (Signed)
[facility] for Heparin Indication: pulmonary embolus  Allergies  Allergen Reactions  . Metformin And Related Other (See Comments)    Patient Measurements: Height: 5' (152.4 cm) Weight: 58.7 kg (129 lb 6.6 oz) IBW/kg (Calculated) : 45.5  Heparin dosing weight: 57.5 kg  Vital Signs: Temp: 97.4 F (36.3 C) (08/25 0546) Temp Source: Oral (08/25 0546) BP: 108/68 (08/25 0546) Pulse Rate: 71 (08/25 0546)  Labs: Recent Labs    05/23/20 0722 05/24/20 0619  HGB 11.1* 10.1*  HCT 36.6 32.8*  PLT 262 235  LABPROT  --  13.8  INR  --  1.1  HEPARINUNFRC 0.72* 0.66  CREATININE 1.20*  --     Estimated Creatinine Clearance: 30.5 mL/min (A) (by C-G formula based on SCr of 1.2 mg/dL (H)).   Assessment: 79 y.o. F presents with SOB. CT shows Large saddle pulmonary embolus with CT evidence of right heart strain (RV/LV ratio of 1.6) consistent with at least submassive (intermediate risk) PE. Pharmacy consulted for heparin. CBC ok on admission. No AC PTA.  Restarted heparin after biopsy and port placement on 8/24 at 950 units/hr. Heparin level at goal at 0.66 on 8/24. Hb stable at 10-11, plts stable in 200s. Last ALT WNL at 14  Goal of Therapy:    Plan:  Discontinue heparin and heparin levels. Initiate apixaban 10 mg PO BID x7d then 5 mg PO BID.  Monitor for bleeding.  Corky Crafts PharmD Candidate

## 2020-05-26 ENCOUNTER — Telehealth: Payer: Self-pay

## 2020-05-26 ENCOUNTER — Telehealth: Payer: Self-pay | Admitting: Oncology

## 2020-05-26 LAB — CULTURE, BODY FLUID W GRAM STAIN -BOTTLE: Culture: NO GROWTH

## 2020-05-26 LAB — SURGICAL PATHOLOGY

## 2020-05-26 NOTE — Telephone Encounter (Signed)
Called Blanch Media and verified that she would be able to attend an appointment today at 2 pm.  Also called Lennette Bihari with [facility] transportation at [phone] to see if he could bring the patient today at 1:30.  He said the soonest she could arrive would be 2:30.    Called Blanch Media and Lennette Bihari back and let them know we will keep the appointment on Monday, 05/30/20 per Dr. Alvy Bimler.

## 2020-05-26 NOTE — Telephone Encounter (Signed)
Called Blanch Media (daughter) and advised her of appointment to see Dr. Alvy Bimler on Monday, 05/30/20 at 1 pm (arrival at 12:30).  She verbalized agreement and will attend the appointment with [facility]

## 2020-05-26 NOTE — Telephone Encounter (Signed)
Amber Gray that Amber Gray needs to see Dr. Alvy Bimler for chemotherapy in light of the biopsy results not Dr. Berline Lopes per D/C instructions. Gave her the appointment with Dr. Alvy Bimler on 05-30-20 at [facility] at 1 pm. Arrive at 1230 to check in and mask is required. Her visit is for an hour. Amber Gray verbalized understanding.

## 2020-05-30 ENCOUNTER — Inpatient Hospital Stay: Payer: Medicare Other

## 2020-05-30 ENCOUNTER — Telehealth: Payer: Self-pay | Admitting: Hematology and Oncology

## 2020-05-30 ENCOUNTER — Other Ambulatory Visit: Payer: Self-pay

## 2020-05-30 ENCOUNTER — Encounter: Payer: Self-pay | Admitting: Oncology

## 2020-05-30 ENCOUNTER — Inpatient Hospital Stay: Payer: Medicare Other | Attending: Hematology and Oncology | Admitting: Hematology and Oncology

## 2020-05-30 ENCOUNTER — Encounter: Payer: Self-pay | Admitting: Hematology and Oncology

## 2020-05-30 VITALS — BP 110/57 | HR 77 | Temp 97.8°F | Resp 18 | Ht 60.0 in | Wt 129.2 lb

## 2020-05-30 DIAGNOSIS — Z7901 Long term (current) use of anticoagulants: Secondary | ICD-10-CM | POA: Diagnosis not present

## 2020-05-30 DIAGNOSIS — C569 Malignant neoplasm of unspecified ovary: Secondary | ICD-10-CM | POA: Diagnosis present

## 2020-05-30 DIAGNOSIS — Z87891 Personal history of nicotine dependence: Secondary | ICD-10-CM | POA: Insufficient documentation

## 2020-05-30 DIAGNOSIS — N183 Chronic kidney disease, stage 3 unspecified: Secondary | ICD-10-CM | POA: Diagnosis not present

## 2020-05-30 DIAGNOSIS — C562 Malignant neoplasm of left ovary: Secondary | ICD-10-CM | POA: Insufficient documentation

## 2020-05-30 DIAGNOSIS — E1122 Type 2 diabetes mellitus with diabetic chronic kidney disease: Secondary | ICD-10-CM | POA: Insufficient documentation

## 2020-05-30 DIAGNOSIS — I2692 Saddle embolus of pulmonary artery without acute cor pulmonale: Secondary | ICD-10-CM

## 2020-05-30 DIAGNOSIS — Z299 Encounter for prophylactic measures, unspecified: Secondary | ICD-10-CM | POA: Insufficient documentation

## 2020-05-30 DIAGNOSIS — E1165 Type 2 diabetes mellitus with hyperglycemia: Secondary | ICD-10-CM | POA: Insufficient documentation

## 2020-05-30 DIAGNOSIS — K5909 Other constipation: Secondary | ICD-10-CM | POA: Insufficient documentation

## 2020-05-30 DIAGNOSIS — Z86711 Personal history of pulmonary embolism: Secondary | ICD-10-CM | POA: Diagnosis not present

## 2020-05-30 NOTE — Assessment & Plan Note (Signed)
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

## 2020-05-30 NOTE — Assessment & Plan Note (Signed)
She tolerated anticoagulation therapy well without major bleeding complications She need at least 3 months of anticoagulation treatment before we can stop her anticoagulation therapy for interval debulking surgery

## 2020-05-30 NOTE — Progress Notes (Signed)
START ON PATHWAY REGIMEN - Ovarian     A cycle is every 21 days:     Paclitaxel      Carboplatin   **Always confirm dose/schedule in your pharmacy ordering system**  Patient Characteristics: Preoperative or Nonsurgical Candidate (Clinical Staging), Newly Diagnosed, Neoadjuvant Therapy followed by Surgery BRCA Mutation Status: Awaiting Test Results Therapeutic Status: Preoperative or Nonsurgical Candidate (Clinical Staging) AJCC T Category: cTX AJCC 8 Stage Grouping: IIIC AJCC N Category: cNX AJCC M Category: cM0 Therapy Plan: Neoadjuvant Therapy followed by Surgery Intent of Therapy: Curative Intent, Discussed with Patient

## 2020-05-30 NOTE — Assessment & Plan Note (Signed)
She has intermittent acute on chronic renal failure We will observe her renal function closely I will adjust the dose of treatment accordingly

## 2020-05-30 NOTE — Progress Notes (Signed)
Met with [name] and her daughter.  Provided them with the [facility] folder and encouraged them to call with any questions.

## 2020-05-30 NOTE — Assessment & Plan Note (Signed)
She has history of uncontrolled diabetes in the past I plan to reduce premedication steroids for her in the future We discussed the importance of dietary modification the day before, the day of and the day after treatment

## 2020-05-30 NOTE — Progress Notes (Signed)
[name] NOTE  Patient Care Team: Amber Baton, MD as PCP - General (Internal Medicine)  ASSESSMENT & PLAN:  Ovarian CA, unspecified laterality [facility]) I have reviewed multiple imaging studies I gave the patient and her daughter a copy of the pathology report Given her history of partial hysterectomy in the past, her current diagnosis is suspicious for ovarian cancer She is not a surgical candidate due to profound weakness and recent submassive PE We discussed neoadjuvant chemotherapy approach  We reviewed the NCCN guidelines We discussed the role of chemotherapy. The intent is of curative intent.  We discussed some of the risks, benefits, side-effects of carboplatin & Taxol. Treatment is intravenous, every 3 weeks x 6 cycles  Some of the short term side-effects included, though not limited to, including weight loss, life threatening infections, risk of allergic reactions, need for transfusions of blood products, nausea, vomiting, change in bowel habits, loss of hair, admission to hospital for various reasons, and risks of death.   Long term side-effects are also discussed including risks of infertility, permanent damage to nerve function, hearing loss, chronic fatigue, kidney damage with possibility needing hemodialysis, and rare secondary malignancy including bone marrow disorders.  The patient is aware that the response rates discussed earlier is not guaranteed.  After a long discussion, patient made an informed decision to proceed with the prescribed plan of care.   Patient education material was dispensed. We discussed premedication with dexamethasone before chemotherapy. She has port placement in the hospital I will schedule chemo education class We discussed the timing of starting date for her chemotherapy The patient would like to hold off until September 13 per patient request I plan to see her next week for further assessment, baseline blood work in  physical examination to make sure she does not need repeat paracentesis or thoracentesis I do not plan prophylactic G-CSF support  Acute pulmonary embolism ([name]) She tolerated anticoagulation therapy well without major bleeding complications She need at least 3 months of anticoagulation treatment before we can stop her anticoagulation therapy for interval debulking surgery  CKD (chronic kidney disease), stage III She has intermittent acute on chronic renal failure We will observe her renal function closely I will adjust the dose of treatment accordingly  Diabetes mellitus type 2, uncontrolled ([name]) She has history of uncontrolled diabetes in the past I plan to reduce premedication steroids for her in the future We discussed the importance of dietary modification the day before, the day of and the day after treatment  Other constipation Her intermittent constipation could be due to carcinomatosis We discussed the importance of laxative therapy  Preventive measure We discussed the importance of Covid vaccination The patient is undecided I recommend she proceed with Covid vaccination now before we start her on chemotherapy but she would like to think about it   Orders Placed This Encounter  Procedures  . CBC with Differential (Cancer Center Only)    Standing Status:   Standing    Number of Occurrences:   20    Standing Expiration Date:   05/30/2021  . CMP ([name] only)    Standing Status:   Standing    Number of Occurrences:   20    Standing Expiration Date:   05/30/2021  . CA 125    Standing Status:   Standing    Number of Occurrences:   11    Standing Expiration Date:   05/30/2021    The total time spent in the appointment  was 60 minutes encounter with patients including review of chart and various tests results, discussions about plan of care and coordination of care plan   All questions were answered. The patient knows to call the clinic with any problems, questions  or concerns. No barriers to learning was detected.  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 female is here because of recent diagnosis of cancer Her daughter, Amber Gray is present She is currently in skilled nursing facility after being dismissed from the hospital She was found to have ovarian cancer and submassive PE recently She is doing better since discharge from the hospital  She has remote history of partial hysterectomy in the past She denies family history of breast cancer or ovarian cancer She presented to the hospital with profound weakness and was subsequently found to have ovarian cancer, PE and pleural effusion  I have reviewed her chart and materials related to her cancer extensively and collaborated history with the patient. 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  CT imaging also revealed abdominal mass.  Subsequent biopsy confirmed diagnosis of ovarian cancer   05/21/2020 Procedure   Successful ultrasound guided diagnostic and therapeutic left thoracentesis yielding 580 cc of pleural fluid.   05/21/2020 Imaging   1. Large saddle pulmonary embolus with CT evidence of right heart strain (RV/LV ratio of 1.6) consistent with at least submassive (intermediate risk) PE. The presence of right heart strain has been associated with an increased risk of morbidity and mortality. 2. Large left and small right pleural effusions. 3. Large amount of peritoneal nodularity, consistent with peritoneal carcinomatosis. 4. Large midline pelvic mass,  measuring up to 12.9 x 8.7 x 9.7 cm. 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   05/21/2020 Pathology Results   Clinical History: None provided  Specimen Submitted:  A. PLEURAL FLUID,LEFT, THORACENTESIS:    FINAL MICROSCOPIC DIAGNOSIS:  - Reactive mesothelial cells present   05/24/2020 Procedure   1. Successful placement of a right internal jugular approach power injectable Port-A-Cath. The catheter is ready for immediate use. 2. Successful ultrasound-guided paracentesis yielding 2.2 L of serous, slightly blood tinged ascitic fluid.   05/24/2020 Pathology Results   A. OMENTUM, NEEDLE CORE BIOPSY:  -  High-grade carcinoma  -  See comment   COMMENT:   By immunohistochemistry, the neoplastic cells are positive for cytokeratin 7 and PAX 8 but negative for cytokeratin 20, CDX2 and TTF-1. 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    05/30/2020 Cancer Staging   Staging form: Ovary, Fallopian Tube, and Primary Peritoneal Carcinoma, AJCC 8th Edition - Clinical stage from 05/30/2020: FIGO Stage IIIC (cT3, cN0, cM0) - Signed by Amber Lark, MD on 05/30/2020    The patient denies any recent signs or symptoms of bleeding such as spontaneous epistaxis, hematuria or hematochezia. She has intermittent chronic constipation that comes and goes No recent nausea Denies recent cough, chest pain or shortness of breath  MEDICAL HISTORY:  Past Medical History:  Diagnosis Date  .  Anxiety   . Arthritis    "all over my body" (05/27/2017)  . Chronic lower back pain   . Depression   . Dyspnea    allergy related SOB  . Frequent UTI   . GERD (gastroesophageal reflux disease)   . Headache    "monthly" (05/27/2017)  . History of blood transfusion    "when I had my neck fusion"  . History of hiatal hernia   . Hypercholesteremia   . Hypertension   .  Hypothyroid   . Pneumonia 2000s X 1; 05/27/2017  . Seasonal allergies   . Type II diabetes mellitus ([place])     SURGICAL HISTORY: Past Surgical History:  Procedure Laterality Date  . ANKLE ARTHROSCOPY Right 03/29/2020   Procedure: RIGHT ANKLE ARTHROSCOPY AND DEBRIDEMENT;  Surgeon: Newt Minion, MD;  Location: [place];  Service: Orthopedics;  Laterality: Right;  . BACK SURGERY    . BREAST BIOPSY Left   . CATARACT EXTRACTION W/ INTRAOCULAR LENS  IMPLANT, BILATERAL Bilateral   . COLONOSCOPY  10/25/2005   normal  . DILATION AND CURETTAGE OF UTERUS    . IR IMAGING GUIDED PORT INSERTION  05/24/2020  . IR PARACENTESIS  05/24/2020  . PARTIAL HYSTERECTOMY  1968  . POSTERIOR CERVICAL FUSION/FORAMINOTOMY     "fused 4,5,6"  . TONSILLECTOMY      SOCIAL HISTORY: Social History   Socioeconomic History  . Marital status: Divorced    Spouse name: Not on file  . Number of children: Not on file  . Years of education: Not on file  . Highest education level: Not on file  Occupational History  . Not on file  Tobacco Use  . Smoking status: Former Smoker    Packs/day: 1.00    Years: 6.00    Pack years: 6.00    Types: Cigarettes    Quit date: 10/01/1964    Years since quitting: 55.6  . Smokeless tobacco: Never Used  Vaping Use  . Vaping Use: Never used  Substance and Sexual Activity  . Alcohol use: No  . Drug use: No  . Sexual activity: Not Currently  Other Topics Concern  . Not on file  Social History Narrative  . Not on file   Social Determinants of Health   Financial Resource Strain:   . Difficulty of Paying Living Expenses: Not on file  Food Insecurity:   . Worried About Charity fundraiser in the Last Year: Not on file  . Ran Out of Food in the Last Year: Not on file  Transportation Needs:   . Lack of Transportation (Medical): Not on file  . Lack of Transportation (Non-Medical): Not on file  Physical Activity:   . Days of Exercise per Week: Not on file  .  Minutes of Exercise per Session: Not on file  Stress:   . Feeling of Stress : Not on file  Social Connections:   . Frequency of Communication with Friends and Family: Not on file  . Frequency of Social Gatherings with Friends and Family: Not on file  . Attends Religious Services: Not on file  . Active Member of Clubs or Organizations: Not on file  . Attends Archivist Meetings: Not on file  . Marital Status: Not on file  Intimate Partner Violence:   . Fear of Current or Ex-Partner: Not on file  . Emotionally Abused: Not on file  . Physically Abused: Not on file  . Sexually Abused: Not on file  FAMILY HISTORY: Family History  Problem Relation Age of Onset  . Alzheimer's disease Mother   . Heart attack Father   . Heart disease Father   . Dwarfism Sister   . Colon cancer Neg Hx   . Colon polyps Neg Hx   . Esophageal cancer Neg Hx   . Rectal cancer Neg Hx   . Stomach cancer Neg Hx     ALLERGIES:  is allergic to metformin and related.  MEDICATIONS:  Current Outpatient Medications  Medication Sig Dispense Refill  . acetaminophen (TYLENOL) 325 MG tablet Take 650 mg by mouth every 6 (six) hours as needed for mild pain.    Marland Kitchen albuterol (PROVENTIL HFA;VENTOLIN HFA) 108 (90 BASE) MCG/ACT inhaler Inhale 2 puffs into the lungs every 6 (six) hours as needed. For wheezing.    Derrill Memo ON 06/01/2020] apixaban (ELIQUIS) 5 MG TABS tablet Take 1 tablet (5 mg total) by mouth 2 (two) times daily. 60 tablet 0  . cetirizine (ZYRTEC) 10 MG tablet Take 10 mg by mouth daily.    . empagliflozin (JARDIANCE) 10 MG TABS tablet Take 10 mg by mouth daily.    . furosemide (LASIX) 40 MG tablet Take 40 mg by mouth daily.   11  . glimepiride (AMARYL) 1 MG tablet Take 0.5 mg by mouth daily.    Marland Kitchen levothyroxine (SYNTHROID, LEVOTHROID) 75 MCG tablet Take 75 mcg by mouth daily.    Marland Kitchen losartan (COZAAR) 50 MG tablet Take 50 mg by mouth daily.    Marland Kitchen omeprazole (PRILOSEC) 20 MG capsule Take 20 mg by mouth  daily.    . ondansetron (ZOFRAN) 4 MG tablet Take 4 mg by mouth. Take one tablet by mouth thirty minutes before each meal.    . polyethylene glycol (MIRALAX / GLYCOLAX) 17 g packet Take 17 g by mouth. BID MTWTF    . sertraline (ZOLOFT) 50 MG tablet Take 50 mg by mouth daily.  0  . simvastatin (ZOCOR) 40 MG tablet Take 40 mg by mouth daily.  3  . TRADJENTA 5 MG TABS tablet Take 5 mg by mouth daily.  3   No current facility-administered medications for this visit.    REVIEW OF SYSTEMS:   Constitutional: Denies fevers, chills or abnormal night sweats Eyes: Denies blurriness of vision, double vision or watery eyes Ears, nose, mouth, throat, and face: Denies mucositis or sore throat Respiratory: Denies cough, dyspnea or wheezes Cardiovascular: Denies palpitation, chest discomfort or lower extremity swelling Skin: Denies abnormal skin rashes Lymphatics: Denies new lymphadenopathy or easy bruising Neurological:Denies numbness, tingling or new weaknesses Behavioral/Psych: Mood is stable, no new changes  All other systems were reviewed with the patient and are negative.  PHYSICAL EXAMINATION: ECOG PERFORMANCE STATUS: 2 - Symptomatic, <50% confined to bed  Vitals:   05/30/20 1252  BP: (!) 110/57  Pulse: 77  Resp: 18  Temp: 97.8 F (36.6 C)  SpO2: 97%   Filed Weights   05/30/20 1252  Weight: 129 lb 3.2 oz (58.6 kg)    GENERAL:alert, no distress and comfortable.  She looks frail and weak SKIN: skin color, texture, turgor are normal, no rashes or significant lesions.  Noted multiple bruises EYES: normal, conjunctiva are pink and non-injected, sclera clear OROPHARYNX:no exudate, no erythema and lips, buccal mucosa, and tongue normal  NECK: supple, thyroid normal size, non-tender, without nodularity LYMPH:  no palpable lymphadenopathy in the cervical, axillary or inguinal LUNGS: Noted reduced breath sound on the left lung base HEART: regular rate & rhythm  and no murmurs and no lower  extremity edema ABDOMEN:abdomen soft, distended with ascites.  Tender to touch but no rebound or guarding Musculoskeletal:no cyanosis of digits and no clubbing  PSYCH: alert & oriented x 3 with fluent speech NEURO: no focal motor/sensory deficits  LABORATORY DATA:  I have reviewed the data as listed Lab Results  Component Value Date   WBC 8.4 05/25/2020   HGB 11.5 (L) 05/25/2020   HCT 37.5 05/25/2020   MCV 86.6 05/25/2020   PLT 253 05/25/2020   Recent Labs    05/20/20 1458 05/22/20 0349 05/23/20 0722  NA 138 136 133*  K 3.7 3.5 3.7  CL 101 105 102  CO2 17* 18* 19*  GLUCOSE 187* 111* 114*  BUN 33* 27* 21  CREATININE 1.45* 1.27* 1.20*  CALCIUM 9.3 8.5* 8.6*  GFRNONAA 34* 40* 43*  GFRAA 40* 46* 50*  PROT 8.2*  --   --   ALBUMIN 3.1*  --  2.9*  AST 27  --   --   ALT 14  --   --   ALKPHOS 201*  --   --   BILITOT 0.8  --   --     RADIOGRAPHIC STUDIES: I have personally reviewed the radiological images as listed and agreed with the findings in the report. DG Chest 1 View  Result Date: 05/21/2020 CLINICAL DATA:  Post thoracentesis EXAM: CHEST  1 VIEW COMPARISON:  May 20, 2020 FINDINGS: The left-sided pleural effusion is smaller in the interval. No pneumothorax identified. The cardiomediastinal silhouette is stable. No other acute abnormalities. IMPRESSION: No pneumothorax after left thoracentesis. The left effusion is smaller. Electronically Signed   By: Dorise Bullion III M.D   On: 05/21/2020 11:53   DG Chest 2 View  Result Date: 05/20/2020 CLINICAL DATA:  79 year old female with shortness of breath. EXAM: CHEST - 2 VIEW COMPARISON:  Chest radiograph dated 12/04/2019. FINDINGS: Small left pleural effusion and left lung base atelectasis or infiltrate. Clinical correlation and follow-up to resolution recommended. The right lung is clear. No pneumothorax. Stable cardiac silhouette. Mild bilateral hilar prominence, likely pulmonary hypertension. There is osteopenia with  degenerative changes of the spine. No acute osseous pathology. IMPRESSION: Small left pleural effusion and left lung base atelectasis or infiltrate. Electronically Signed   By: Anner Crete M.D.   On: 05/20/2020 15:39   CT Angio Chest PE W and/or Wo Contrast  Result Date: 05/21/2020 CLINICAL DATA:  Abdominal pain and shortness of breath. Recent surgery. EXAM: CT ANGIOGRAPHY CHEST CT ABDOMEN AND PELVIS WITH CONTRAST TECHNIQUE: Multidetector CT imaging of the chest was performed using the standard protocol during bolus administration of intravenous contrast. Multiplanar CT image reconstructions and MIPs were obtained to evaluate the vascular anatomy. Multidetector CT imaging of the abdomen and pelvis was performed using the standard protocol during bolus administration of intravenous contrast. CONTRAST:  32m OMNIPAQUE IOHEXOL 350 MG/ML SOLN COMPARISON:  None. FINDINGS: CTA CHEST FINDINGS Cardiovascular: Contrast injection is sufficient to demonstrate satisfactory opacification of the pulmonary arteries to the segmental level.There is a saddle pulmonary embolus extending into all proximal lobar branches. There is evidence of right heart strain with an RV to LV ratio of 1.6.the visualized aorta is normal. There is a normal 3-vessel arch branching pattern. Heart size is normal, without pericardial effusion. Mediastinum/Nodes: No mediastinal, hilar or axillary lymphadenopathy. The visualized thyroid and thoracic esophageal course are unremarkable. Lungs/Pleura: Small right and large left pleural effusions. Musculoskeletal: No chest wall abnormality. No acute or significant osseous  findings. Review of the MIP images confirms the above findings. CT ABDOMEN and PELVIS FINDINGS Hepatobiliary: Normal hepatic contours and density. No visible biliary dilatation. Perihepatic ascites. Normal gallbladder. Pancreas: Normal contours without ductal dilatation. No peripancreatic fluid collection. Spleen: Normal.  Adrenals/Urinary Tract: --Adrenal glands: Normal. --Right kidney/ureter: No hydronephrosis or perinephric stranding. No nephrolithiasis. No obstructing ureteral stones. --Left kidney/ureter: No hydronephrosis or perinephric stranding. No nephrolithiasis. No obstructing ureteral stones. --Urinary bladder: Unremarkable. Stomach/Bowel: --Stomach/Duodenum: Intermediate sized hiatal hernia. --Small bowel: No dilatation or inflammation. --Colon: No focal abnormality. --Appendix: Not visualized Vascular/Lymphatic: Normal course and caliber of the major abdominal vessels. No abdominal or pelvic lymphadenopathy. Reproductive: There is a heterogeneous, multilobulated midline pelvic mass that measures 12.9 x 8.7 x 9.7 cm. It is unclear whether some component of this is the uterus. The ovaries are not separately visualized. Musculoskeletal. No bony spinal canal stenosis or focal osseous abnormality. Other: There is a large amount of peritoneal nodularity, particularly in the left upper quadrant. A left anterior abdomen circumscribed fluid collection measures 6.2 x 3.9 cm. IMPRESSION: 1. Large saddle pulmonary embolus with CT evidence of right heart strain (RV/LV ratio of 1.6) consistent with at least submassive (intermediate risk) PE. The presence of right heart strain has been associated with an increased risk of morbidity and mortality. 2. Large left and small right pleural effusions. 3. Large amount of peritoneal nodularity, consistent with peritoneal carcinomatosis. 4. Large midline pelvic mass, measuring up to 12.9 x 8.7 x 9.7 cm. 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 Critical Value/emergent results were called by telephone at the time of interpretation on 05/21/2020 at 1:09 am to provider [facility], who verbally acknowledged these results. Electronically Signed   By: Ulyses Jarred M.D.   On: 05/21/2020 01:17   CT ABDOMEN PELVIS W  CONTRAST  Result Date: 05/21/2020 CLINICAL DATA:  Abdominal pain and shortness of breath. Recent surgery. EXAM: CT ANGIOGRAPHY CHEST CT ABDOMEN AND PELVIS WITH CONTRAST TECHNIQUE: Multidetector CT imaging of the chest was performed using the standard protocol during bolus administration of intravenous contrast. Multiplanar CT image reconstructions and MIPs were obtained to evaluate the vascular anatomy. Multidetector CT imaging of the abdomen and pelvis was performed using the standard protocol during bolus administration of intravenous contrast. CONTRAST:  36m OMNIPAQUE IOHEXOL 350 MG/ML SOLN COMPARISON:  None. FINDINGS: CTA CHEST FINDINGS Cardiovascular: Contrast injection is sufficient to demonstrate satisfactory opacification of the pulmonary arteries to the segmental level.There is a saddle pulmonary embolus extending into all proximal lobar branches. There is evidence of right heart strain with an RV to LV ratio of 1.6.the visualized aorta is normal. There is a normal 3-vessel arch branching pattern. Heart size is normal, without pericardial effusion. Mediastinum/Nodes: No mediastinal, hilar or axillary lymphadenopathy. The visualized thyroid and thoracic esophageal course are unremarkable. Lungs/Pleura: Small right and large left pleural effusions. Musculoskeletal: No chest wall abnormality. No acute or significant osseous findings. Review of the MIP images confirms the above findings. CT ABDOMEN and PELVIS FINDINGS Hepatobiliary: Normal hepatic contours and density. No visible biliary dilatation. Perihepatic ascites. Normal gallbladder. Pancreas: Normal contours without ductal dilatation. No peripancreatic fluid collection. Spleen: Normal. Adrenals/Urinary Tract: --Adrenal glands: Normal. --Right kidney/ureter: No hydronephrosis or perinephric stranding. No nephrolithiasis. No obstructing ureteral stones. --Left kidney/ureter: No hydronephrosis or perinephric stranding. No nephrolithiasis. No obstructing  ureteral stones. --Urinary bladder: Unremarkable. Stomach/Bowel: --Stomach/Duodenum: Intermediate sized hiatal hernia. --Small bowel: No dilatation or inflammation. --  Colon: No focal abnormality. --Appendix: Not visualized Vascular/Lymphatic: Normal course and caliber of the major abdominal vessels. No abdominal or pelvic lymphadenopathy. Reproductive: There is a heterogeneous, multilobulated midline pelvic mass that measures 12.9 x 8.7 x 9.7 cm. It is unclear whether some component of this is the uterus. The ovaries are not separately visualized. Musculoskeletal. No bony spinal canal stenosis or focal osseous abnormality. Other: There is a large amount of peritoneal nodularity, particularly in the left upper quadrant. A left anterior abdomen circumscribed fluid collection measures 6.2 x 3.9 cm. IMPRESSION: 1. Large saddle pulmonary embolus with CT evidence of right heart strain (RV/LV ratio of 1.6) consistent with at least submassive (intermediate risk) PE. The presence of right heart strain has been associated with an increased risk of morbidity and mortality. 2. Large left and small right pleural effusions. 3. Large amount of peritoneal nodularity, consistent with peritoneal carcinomatosis. 4. Large midline pelvic mass, measuring up to 12.9 x 8.7 x 9.7 cm. 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 Critical Value/emergent results were called by telephone at the time of interpretation on 05/21/2020 at 1:09 am to provider [facility], who verbally acknowledged these results. Electronically Signed   By: Ulyses Jarred M.D.   On: 05/21/2020 01:17   CT BIOPSY  Result Date: 05/24/2020 INDICATION: No known primary, now with omental caking worrisome for metastatic disease. Please perform CT-guided omental mass/cake biopsy for tissue diagnostic purposes. EXAM: CT-GUIDED OMENTAL MASS BIOPSY COMPARISON:  CT the chest, abdomen and  pelvis-05/21/2020 MEDICATIONS: None. ANESTHESIA/SEDATION: Fentanyl 75 mcg IV; Versed 1.5 mg IV Sedation time: 10 minutes; The patient was continuously monitored during the procedure by the interventional radiology nurse under my direct supervision. CONTRAST:  None. COMPLICATIONS: None immediate. PROCEDURE: Informed consent was obtained from the patient following an explanation of the procedure, risks, benefits and alternatives. A time out was performed prior to the initiation of the procedure. The patient was positioned supine on the CT table and a limited CT was performed for procedural planning demonstrating grossly unchanged size and appearance of omental caking, centered within the ventral aspect the left mid hemiabdomen with dominant mixed attenuating component measuring at least 12 x 5.6 cm (image 55, series 2). Note is also made of a small to moderate amount of intra-abdominal ascites. The procedure was planned. The operative site was prepped and draped in the usual sterile fashion. Appropriate trajectory was confirmed with a 22 gauge spinal needle after the adjacent tissues were anesthetized with 1% Lidocaine with epinephrine. Under intermittent CT guidance, a 17 gauge coaxial needle was advanced into the peripheral aspect of the mass. Appropriate positioning was confirmed and 6 core needle biopsy samples were obtained with an 18 gauge core needle biopsy device. The co-axial needle was removed following administration of a Gel-Foam slurry and superficial hemostasis was achieved with manual compression. A limited postprocedural CT was negative for hemorrhage or additional complication. A dressing was placed. The patient tolerated the procedure well without immediate postprocedural complication. IMPRESSION: Technically successful CT guided core needle biopsy of dominant omental caking within the ventral aspect of the left mid hemiabdomen. Note, patient subsequent underwent image guided paracentesis and Port a  catheter placement as dictated separately. Electronically Signed   By: Sandi Mariscal M.D.   On: 05/24/2020 15:59   VAS [place] IVC/ILIAC (VENOUS ONLY)  Result Date: 05/21/2020 IVC/ILIAC STUDY Indications: Pulmonary embolism with pelvic mass, left saphenofemoral junction  DVT Limitations: Air/bowel gas, patient discomfort and anatomic distortion secondary to pelvic mass.  Comparison Study: No prior study Performing Technologist: Maudry Mayhew MHA, RDMS, RVT, RDCS  Examination Guidelines: A complete evaluation includes B-mode imaging, spectral Doppler, color Doppler, and power Doppler as needed of all accessible portions of each vessel. Bilateral testing is considered an integral part of a complete examination. Limited examinations for reoccurring indications may be performed as noted.  +-------------------+---------+-----------+---------+-----------+--------+         CIV        RT-PatentRT-ThrombusLT-PatentLT-ThrombusComments +-------------------+---------+-----------+---------+-----------+--------+ Common Iliac Distal patent              patent                      +-------------------+---------+-----------+---------+-----------+--------+  +------------------------+---------+-----------+---------+-----------+--------+           EIV           RT-PatentRT-ThrombusLT-PatentLT-ThrombusComments +------------------------+---------+-----------+---------+-----------+--------+ External Iliac Vein Prox patent              patent                      +------------------------+---------+-----------+---------+-----------+--------+   Summary: IVC/Iliac: Study was limited. There is no obvious evidence of occlusive thrombus involving the right common iliac vein and left common iliac vein. There is no obvious evidence of occlusive thrombus involving the right external iliac vein and left external iliac vein. Unable to visualize IVC due to technical limitations listed above.  *See table(s)  above for measurements and observations.  Electronically signed by Deitra Mayo MD on 05/21/2020 at 3:15:52 PM.   Final    ECHOCARDIOGRAM COMPLETE  Result Date: 05/21/2020    ECHOCARDIOGRAM REPORT   Patient Name:   Amber Gray Date of Exam: 05/21/2020 Medical Rec #:  [id number]           Height:       60.0 in Accession #:    [id number]          Weight:       129.6 lb Date of Birth:  [birth year]           BSA:          1.552 m Patient Age:    26 years            BP:           118/69 mmHg Patient Gender: F                   HR:           69 bpm. Exam Location:  Inpatient Procedure: 2D Echo, Color Doppler, Cardiac Doppler and Intracardiac            Opacification Agent Indications:    Pulmonary embolus  History:        Patient has no prior history of Echocardiogram examinations.                 Signs/Symptoms:Chest Pain and Shortness of Breath; Risk                 Factors:Former Smoker, Diabetes and Hypertension. CKD.  Sonographer:    Clayton Lefort RDCS (AE) Referring Phys: [id number] [place]  1. Left ventricular ejection fraction, by estimation, is 60 to 65%. The left ventricle has normal function. The left ventricle has no regional wall motion abnormalities. There is mild left ventricular hypertrophy. Left ventricular diastolic parameters are consistent with Grade I diastolic dysfunction (impaired relaxation).  There is the interventricular septum is flattened in systole and diastole, consistent with right ventricular pressure and volume overload.  2. Right ventricular systolic function is moderately reduced. The right ventricular size is moderately enlarged. There is mildly elevated pulmonary artery systolic pressure.  3. The mitral valve is normal in structure. Trivial mitral valve regurgitation.  4. The aortic valve is normal in structure. Aortic valve regurgitation is trivial. Mild to moderate aortic valve sclerosis/calcification is present, without any evidence of aortic stenosis.  FINDINGS  Left Ventricle: Left ventricular ejection fraction, by estimation, is 60 to 65%. The left ventricle has normal function. The left ventricle has no regional wall motion abnormalities. Definity contrast agent was given IV to delineate the left ventricular  endocardial borders. The left ventricular internal cavity size was normal in size. There is mild left ventricular hypertrophy. The interventricular septum is flattened in systole and diastole, consistent with right ventricular pressure and volume overload. Left ventricular diastolic parameters are consistent with Grade I diastolic dysfunction (impaired relaxation). Right Ventricle: The right ventricular size is moderately enlarged. No increase in right ventricular wall thickness. Right ventricular systolic function is moderately reduced. There is mildly elevated pulmonary artery systolic pressure. The tricuspid regurgitant velocity is 2.92 m/s, and with an assumed right atrial pressure of 10 mmHg, the estimated right ventricular systolic pressure is 23.5 mmHg. Left Atrium: Left atrial size was normal in size. Right Atrium: Right atrial size was not assessed. Pericardium: There is no evidence of pericardial effusion. Mitral Valve: The mitral valve is normal in structure. Trivial mitral valve regurgitation. Tricuspid Valve: The tricuspid valve is normal in structure. Tricuspid valve regurgitation is mild. Aortic Valve: The aortic valve is normal in structure.. There is mild thickening of the aortic valve. Aortic valve regurgitation is trivial. Mild to moderate aortic valve sclerosis/calcification is present, without any evidence of aortic stenosis. There is mild thickening of the aortic valve. Aortic valve mean gradient measures 3.0 mmHg. Aortic valve peak gradient measures 6.1 mmHg. Aortic valve area, by VTI measures 1.84 cm. Pulmonic Valve: The pulmonic valve was grossly normal. Pulmonic valve regurgitation is mild. Aorta: The aortic root and ascending  aorta are structurally normal, with no evidence of dilitation. IAS/Shunts: No atrial level shunt detected by color flow Doppler.  LEFT VENTRICLE PLAX 2D LVIDd:         2.70 cm  Diastology LVIDs:         2.00 cm  LV e' lateral:   6.09 cm/s LV PW:         1.30 cm  LV E/e' lateral: 8.0 LV IVS:        1.20 cm  LV e' medial:    4.46 cm/s LVOT diam:     1.65 cm  LV E/e' medial:  10.9 LV SV:         45 LV SV Index:   29 LVOT Area:     2.14 cm  RIGHT VENTRICLE             IVC RV Basal diam:  2.70 cm     IVC diam: 1.60 cm RV S prime:     10.40 cm/s TAPSE (M-mode): 1.8 cm LEFT ATRIUM           Index       RIGHT ATRIUM           Index LA diam:      2.60 cm 1.67 cm/m  RA Area:     13.40 cm LA Vol (A4C): 28.1  ml 18.10 ml/m RA Volume:   35.20 ml  22.67 ml/m  AORTIC VALVE AV Area (Vmax):    1.93 cm AV Area (Vmean):   1.74 cm AV Area (VTI):     1.84 cm AV Vmax:           123.00 cm/s AV Vmean:          79.700 cm/s AV VTI:            0.243 m AV Peak Grad:      6.1 mmHg AV Mean Grad:      3.0 mmHg LVOT Vmax:         111.00 cm/s LVOT Vmean:        64.800 cm/s LVOT VTI:          0.209 m LVOT/AV VTI ratio: 0.86  AORTA Ao Root diam: 3.00 cm Ao Asc diam:  3.10 cm MITRAL VALVE               TRICUSPID VALVE MV Area (PHT): 2.62 cm    TR Peak grad:   34.1 mmHg MV Decel Time: 289 msec    TR Vmax:        292.00 cm/s MV E velocity: 48.80 cm/s MV A velocity: 84.00 cm/s  SHUNTS MV E/A ratio:  0.58        Systemic VTI:  0.21 m                            Systemic Diam: 1.65 cm Glori Bickers MD Electronically signed by Glori Bickers MD Signature Date/Time: 05/21/2020/2:27:06 PM    Final    IR IMAGING GUIDED PORT INSERTION  Result Date: 05/24/2020 INDICATION: No known primary, now with omental caking worrisome for metastatic disease. Please perform Port a catheter placement for the acquisition of durable intravenous access for chemotherapy administration. Patient also with symptomatic presumably malignant ascites. Please perform  ultrasound-guided paracentesis for therapeutic purposes. EXAM: 1. IMPLANTED PORT A CATH PLACEMENT WITH ULTRASOUND AND FLUOROSCOPIC GUIDANCE 2. ULTRASOUND-GUIDED PARACENTESIS COMPARISON:  CT of the chest, abdomen pelvis-05/21/2020 MEDICATIONS: The patient is currently admitted to the hospital receiving intravenous antibiotics; The antibiotic was administered within an appropriate time interval prior to skin puncture. ANESTHESIA/SEDATION: None (patient remained sedated from medication administered during preceding CT-guided omental mass biopsy and did not require additional medication for Port a catheter placement). CONTRAST:  None FLUOROSCOPY TIME:  18 seconds (2 mGy) COMPLICATIONS: None immediate. PROCEDURE: The procedure, risks, benefits, and alternatives were explained to the patient. Questions regarding the procedure were encouraged and answered. The patient understands and consents to the procedure. Attention was initially paid to the paracentesis portion of the procedure. Sonographic evaluation demonstrates a moderate amount of intra-abdominal ascites, centered within the right lower abdomen. As such, the skin overlying the ventral aspect of the right lower abdomen was prepped and draped in usual sterile fashion. After the overlying soft tissues were anesthetized with 1% lidocaine with epinephrine, a Safe-T-Centesis catheter was inserted under direct ultrasound guidance. Next, paracentesis was performed ultimately yielding 2.2 L of serous slightly blood tinged peritoneal fluid. Attention was now paid towards placement of the [facility] a Catheter. The right neck and chest were prepped with chlorhexidine in a sterile fashion, and a sterile drape was applied covering the operative field. Maximum barrier sterile technique with sterile gowns and gloves were used for the procedure. A timeout was performed prior to the initiation of the procedure. Local anesthesia was provided with 1%  lidocaine with epinephrine. After  creating a small venotomy incision, a micropuncture kit was utilized to access the internal jugular vein. Real-time ultrasound guidance was utilized for vascular access including the acquisition of a permanent ultrasound image documenting patency of the accessed vessel. The microwire was utilized to measure appropriate catheter length. A subcutaneous port pocket was then created along the upper chest wall utilizing a combination of sharp and blunt dissection. The pocket was irrigated with sterile saline. A single lumen "Slim" sized power injectable port was chosen for placement. The 8 Fr catheter was tunneled from the port pocket site to the venotomy incision. The port was placed in the pocket. The external catheter was trimmed to appropriate length. At the venotomy, an 8 Fr peel-away sheath was placed over a guidewire under fluoroscopic guidance. The catheter was then placed through the sheath and the sheath was removed. Final catheter positioning was confirmed and documented with a fluoroscopic spot radiograph. The port was accessed with a Huber needle, aspirated and flushed with heparinized saline. The venotomy site was closed with an interrupted 4-0 Vicryl suture. The port pocket incision was closed with interrupted 2-0 Vicryl suture. The skin was opposed with a running subcuticular 4-0 Vicryl suture. Dermabond and Steri-strips were applied to both incisions. Dressings were applied. The patient tolerated the procedure well without immediate post procedural complication. FINDINGS: Sonographic evaluation demonstrates a moderate amount of intra-abdominal ascites. Approximately 2.2 L of serous slightly blood tinged ascitic fluid following ultrasound-guided paracentesis. After Port a catheter placement, the tip lies within the superior cavoatrial junction. The catheter aspirates and flushes normally and is ready for immediate use. IMPRESSION: 1. Successful placement of a right internal jugular approach power  injectable Port-A-Cath. The catheter is ready for immediate use. 2. Successful ultrasound-guided paracentesis yielding 2.2 L of serous, slightly blood tinged ascitic fluid. Electronically Signed   By: Sandi Mariscal M.D.   On: 05/24/2020 16:48   VAS [place] LOWER EXTREMITY VENOUS (DVT)  Result Date: 05/21/2020  Lower Venous DVTStudy Indications: Pulmonary embolism, and pelvic mass.  Limitations: Poor ultrasound/tissue interface, body habitus and patient position. Comparison Study: No prior study Performing Technologist: Maudry Mayhew MHA, RDMS, RVT, RDCS  Examination Guidelines: A complete evaluation includes B-mode imaging, spectral Doppler, color Doppler, and power Doppler as needed of all accessible portions of each vessel. Bilateral testing is considered an integral part of a complete examination. Limited examinations for reoccurring indications may be performed as noted. The reflux portion of the exam is performed with the patient in reverse Trendelenburg.  +---------+---------------+---------+-----------+----------+--------------+ RIGHT    CompressibilityPhasicitySpontaneityPropertiesThrombus Aging +---------+---------------+---------+-----------+----------+--------------+ CFV      Full           Yes      Yes                                 +---------+---------------+---------+-----------+----------+--------------+ SFJ      Full                                                        +---------+---------------+---------+-----------+----------+--------------+ FV Prox  Full                                                        +---------+---------------+---------+-----------+----------+--------------+  FV Mid   Full                                                        +---------+---------------+---------+-----------+----------+--------------+ FV DistalFull                                                         +---------+---------------+---------+-----------+----------+--------------+ PFV      Full                                                        +---------+---------------+---------+-----------+----------+--------------+ POP      Full           Yes      Yes                                 +---------+---------------+---------+-----------+----------+--------------+ PTV      Full                                                        +---------+---------------+---------+-----------+----------+--------------+ PERO     Full                                                        +---------+---------------+---------+-----------+----------+--------------+   +-------+---------------+---------+-----------+----------+--------------+ LEFT   CompressibilityPhasicitySpontaneityPropertiesThrombus Aging +-------+---------------+---------+-----------+----------+--------------+ CFV    Full           No       Yes                                 +-------+---------------+---------+-----------+----------+--------------+ SFJ    None                                         Acute          +-------+---------------+---------+-----------+----------+--------------+ FV ProxFull                                                        +-------+---------------+---------+-----------+----------+--------------+ FV Mid Full                                                        +-------+---------------+---------+-----------+----------+--------------+  PTV    None                    No                   Acute          +-------+---------------+---------+-----------+----------+--------------+ PERO   Full                                                        +-------+---------------+---------+-----------+----------+--------------+ GSV    None                                         Acute at [facility]   +-------+---------------+---------+-----------+----------+--------------+    Left Technical Findings: Not visualized segments include PFV, distal FV, popliteal vein.   Summary: RIGHT: - There is no evidence of deep vein thrombosis in the lower extremity.  - No cystic structure found in the popliteal fossa.  LEFT: - Findings consistent with acute deep vein thrombosis involving the SF junction, and left posterior tibial veins. - No cystic structure found in the popliteal fossa.  *See table(s) above for measurements and observations. Electronically signed by Deitra Mayo MD on 05/21/2020 at 3:16:04 PM.    Final    IR Paracentesis  Result Date: 05/24/2020 INDICATION: No known primary, now with omental caking worrisome for metastatic disease. Please perform Port a catheter placement for the acquisition of durable intravenous access for chemotherapy administration. Patient also with symptomatic presumably malignant ascites. Please perform ultrasound-guided paracentesis for therapeutic purposes. EXAM: 1. IMPLANTED PORT A CATH PLACEMENT WITH ULTRASOUND AND FLUOROSCOPIC GUIDANCE 2. ULTRASOUND-GUIDED PARACENTESIS COMPARISON:  CT of the chest, abdomen pelvis-05/21/2020 MEDICATIONS: The patient is currently admitted to the hospital receiving intravenous antibiotics; The antibiotic was administered within an appropriate time interval prior to skin puncture. ANESTHESIA/SEDATION: None (patient remained sedated from medication administered during preceding CT-guided omental mass biopsy and did not require additional medication for Port a catheter placement). CONTRAST:  None FLUOROSCOPY TIME:  18 seconds (2 mGy) COMPLICATIONS: None immediate. PROCEDURE: The procedure, risks, benefits, and alternatives were explained to the patient. Questions regarding the procedure were encouraged and answered. The patient understands and consents to the procedure. Attention was initially paid to the paracentesis portion of the procedure. Sonographic evaluation demonstrates a moderate amount of intra-abdominal  ascites, centered within the right lower abdomen. As such, the skin overlying the ventral aspect of the right lower abdomen was prepped and draped in usual sterile fashion. After the overlying soft tissues were anesthetized with 1% lidocaine with epinephrine, a Safe-T-Centesis catheter was inserted under direct ultrasound guidance. Next, paracentesis was performed ultimately yielding 2.2 L of serous slightly blood tinged peritoneal fluid. Attention was now paid towards placement of the [facility] a Catheter. The right neck and chest were prepped with chlorhexidine in a sterile fashion, and a sterile drape was applied covering the operative field. Maximum barrier sterile technique with sterile gowns and gloves were used for the procedure. A timeout was performed prior to the initiation of the procedure. Local anesthesia was provided with 1% lidocaine with epinephrine. After creating a small venotomy incision, a micropuncture kit was utilized to access the internal jugular vein. Real-time ultrasound guidance was utilized for vascular access  including the acquisition of a permanent ultrasound image documenting patency of the accessed vessel. The microwire was utilized to measure appropriate catheter length. A subcutaneous port pocket was then created along the upper chest wall utilizing a combination of sharp and blunt dissection. The pocket was irrigated with sterile saline. A single lumen "Slim" sized power injectable port was chosen for placement. The 8 Fr catheter was tunneled from the port pocket site to the venotomy incision. The port was placed in the pocket. The external catheter was trimmed to appropriate length. At the venotomy, an 8 Fr peel-away sheath was placed over a guidewire under fluoroscopic guidance. The catheter was then placed through the sheath and the sheath was removed. Final catheter positioning was confirmed and documented with a fluoroscopic spot radiograph. The port was accessed with a Huber  needle, aspirated and flushed with heparinized saline. The venotomy site was closed with an interrupted 4-0 Vicryl suture. The port pocket incision was closed with interrupted 2-0 Vicryl suture. The skin was opposed with a running subcuticular 4-0 Vicryl suture. Dermabond and Steri-strips were applied to both incisions. Dressings were applied. The patient tolerated the procedure well without immediate post procedural complication. FINDINGS: Sonographic evaluation demonstrates a moderate amount of intra-abdominal ascites. Approximately 2.2 L of serous slightly blood tinged ascitic fluid following ultrasound-guided paracentesis. After Port a catheter placement, the tip lies within the superior cavoatrial junction. The catheter aspirates and flushes normally and is ready for immediate use. IMPRESSION: 1. Successful placement of a right internal jugular approach power injectable Port-A-Cath. The catheter is ready for immediate use. 2. Successful ultrasound-guided paracentesis yielding 2.2 L of serous, slightly blood tinged ascitic fluid. Electronically Signed   By: Sandi Mariscal M.D.   On: 05/24/2020 16:48   US THORACENTESIS ASP PLEURAL SPACE W/IMG GUIDE  Result Date: 05/21/2020 INDICATION: Patient with history of dyspnea, large saddle pulmonary embolism, bilateral pleural effusions left greater than right, peritoneal nodularity, pelvic mass; request received for diagnostic and therapeutic left thoracentesis. EXAM: ULTRASOUND GUIDED DIAGNOSTIC AND THERAPEUTIC LEFT THORACENTESIS MEDICATIONS: 1% lidocaine to skin and subcutaneous tissue COMPLICATIONS: None immediate. PROCEDURE: An ultrasound guided thoracentesis was thoroughly discussed with the patient and questions answered. The benefits, risks, alternatives and complications were also discussed. The patient understands and wishes to proceed with the procedure. Written consent was obtained. Ultrasound was performed to localize and mark an adequate pocket of fluid in  the left chest. The area was then prepped and draped in the normal sterile fashion. 1% Lidocaine was used for local anesthesia. Under ultrasound guidance a 6 Fr Safe-T-Centesis catheter was introduced. Thoracentesis was performed. The catheter was removed and a dressing applied. FINDINGS: A total of approximately 580 cc of slightly hazy, amber fluid was removed. Samples were sent to the laboratory as requested by the clinical team. IMPRESSION: Successful ultrasound guided diagnostic and therapeutic left thoracentesis yielding 580 cc of pleural fluid. Read by: Rowe Robert, PA-C Electronically Signed   By: Jacqulynn Cadet M.D.   On: 05/21/2020 11:05

## 2020-05-30 NOTE — Telephone Encounter (Signed)
Scheduled per los. Gave avs and calendar  

## 2020-05-30 NOTE — Assessment & Plan Note (Signed)
We discussed the importance of Covid vaccination The patient is undecided I recommend she proceed with Covid vaccination now before we start her on chemotherapy but she would like to think about it

## 2020-05-30 NOTE — Assessment & Plan Note (Signed)
I have reviewed multiple imaging studies I gave the patient and her daughter a copy of the pathology report Given her history of partial hysterectomy in the past, her current diagnosis is suspicious for ovarian cancer She is not a surgical candidate due to profound weakness and recent submassive PE We discussed neoadjuvant chemotherapy approach  We reviewed the NCCN guidelines We discussed the role of chemotherapy. The intent is of curative intent.  We discussed some of the risks, benefits, side-effects of carboplatin & Taxol. Treatment is intravenous, every 3 weeks x 6 cycles  Some of the short term side-effects included, though not limited to, including weight loss, life threatening infections, risk of allergic reactions, need for transfusions of blood products, nausea, vomiting, change in bowel habits, loss of hair, admission to hospital for various reasons, and risks of death.   Long term side-effects are also discussed including risks of infertility, permanent damage to nerve function, hearing loss, chronic fatigue, kidney damage with possibility needing hemodialysis, and rare secondary malignancy including bone marrow disorders.  The patient is aware that the response rates discussed earlier is not guaranteed.  After a long discussion, patient made an informed decision to proceed with the prescribed plan of care.   Patient education material was dispensed. We discussed premedication with dexamethasone before chemotherapy. She has port placement in the hospital I will schedule chemo education class We discussed the timing of starting date for her chemotherapy The patient would like to hold off until September 13 per patient request I plan to see her next week for further assessment, baseline blood work in physical examination to make sure she does not need repeat paracentesis or thoracentesis I do not plan prophylactic G-CSF support

## 2020-06-01 ENCOUNTER — Telehealth: Payer: Self-pay | Admitting: Oncology

## 2020-06-01 NOTE — Telephone Encounter (Signed)
OK, please help me move her appt

## 2020-06-01 NOTE — Telephone Encounter (Signed)
Amber Gray (daughter) called and asked if they can reschedule Amber Gray's chemotherapy appointment from 05/13/20 to 05/15/20.  She will be out of town on 05/13/20 and is worried Amber Gray might not have transportation.    She is also wondering if it is OK to keep the lab/flush, follow up with Dr. Alvy Bimler on 06/09/20.  They would like to keep them on 9/9 if it is ok.

## 2020-06-01 NOTE — Telephone Encounter (Signed)
Called Amber Gray and advised her that the appointments on 9/9 were good and to expect a call from the schedulers with the appointment time for chemotherapy on 9/15.

## 2020-06-07 ENCOUNTER — Telehealth: Payer: Self-pay | Admitting: Hematology and Oncology

## 2020-06-07 NOTE — Telephone Encounter (Signed)
Daughter called in to confirm patients appts. Confirmed all upcoming appts

## 2020-06-08 ENCOUNTER — Encounter: Payer: Self-pay | Admitting: Hematology and Oncology

## 2020-06-08 ENCOUNTER — Telehealth: Payer: Self-pay | Admitting: Oncology

## 2020-06-08 NOTE — Telephone Encounter (Signed)
Blanch Media (daughter) called and asked for a prescription for EMLA cream so that Amber Gray can use it for labs/flush tomorrow. They would like it sent to the [facility] on [address].

## 2020-06-08 NOTE — Progress Notes (Signed)
Called pt to introduce myself as her Arboriculturist and to discuss the [employer].  Pt has 2 insurances so copay assistance shouldn't be needed.  I attempted to leave a msg but her voicemail isn't set up so I will try and meet w/ her at her next visit.

## 2020-06-08 NOTE — Telephone Encounter (Signed)
Called Amber Gray back and let her know that Dr. Alvy Bimler will prescribe all the PRN medications including EMLA at her appointment tomorrow.  Also that ice can be used to numb her port tomorrow.  Amber Gray verbalized agreement. She also asked if transportation has been arranged through [facility].  Amber Gray at [facility] and informed him of appointments.  He said he will be able to provide transportation.  Called Amber Gray back and let her know that [facility] will provide transportation.

## 2020-06-09 ENCOUNTER — Other Ambulatory Visit: Payer: Medicare Other

## 2020-06-09 ENCOUNTER — Other Ambulatory Visit: Payer: Self-pay | Admitting: Hematology and Oncology

## 2020-06-09 ENCOUNTER — Other Ambulatory Visit: Payer: Self-pay

## 2020-06-09 ENCOUNTER — Encounter: Payer: Self-pay | Admitting: Hematology and Oncology

## 2020-06-09 ENCOUNTER — Inpatient Hospital Stay: Payer: Medicare Other

## 2020-06-09 ENCOUNTER — Inpatient Hospital Stay: Payer: Medicare Other | Attending: Hematology and Oncology | Admitting: Hematology and Oncology

## 2020-06-09 DIAGNOSIS — I129 Hypertensive chronic kidney disease with stage 1 through stage 4 chronic kidney disease, or unspecified chronic kidney disease: Secondary | ICD-10-CM | POA: Insufficient documentation

## 2020-06-09 DIAGNOSIS — Z7984 Long term (current) use of oral hypoglycemic drugs: Secondary | ICD-10-CM | POA: Diagnosis not present

## 2020-06-09 DIAGNOSIS — C569 Malignant neoplasm of unspecified ovary: Secondary | ICD-10-CM | POA: Insufficient documentation

## 2020-06-09 DIAGNOSIS — I10 Essential (primary) hypertension: Secondary | ICD-10-CM

## 2020-06-09 DIAGNOSIS — J90 Pleural effusion, not elsewhere classified: Secondary | ICD-10-CM | POA: Diagnosis not present

## 2020-06-09 DIAGNOSIS — R110 Nausea: Secondary | ICD-10-CM | POA: Diagnosis not present

## 2020-06-09 DIAGNOSIS — Z7901 Long term (current) use of anticoagulants: Secondary | ICD-10-CM | POA: Insufficient documentation

## 2020-06-09 DIAGNOSIS — I2699 Other pulmonary embolism without acute cor pulmonale: Secondary | ICD-10-CM | POA: Insufficient documentation

## 2020-06-09 DIAGNOSIS — I2692 Saddle embolus of pulmonary artery without acute cor pulmonale: Secondary | ICD-10-CM | POA: Diagnosis not present

## 2020-06-09 DIAGNOSIS — N183 Chronic kidney disease, stage 3 unspecified: Secondary | ICD-10-CM | POA: Diagnosis not present

## 2020-06-09 DIAGNOSIS — R609 Edema, unspecified: Secondary | ICD-10-CM | POA: Insufficient documentation

## 2020-06-09 DIAGNOSIS — K5909 Other constipation: Secondary | ICD-10-CM | POA: Diagnosis not present

## 2020-06-09 DIAGNOSIS — Z5111 Encounter for antineoplastic chemotherapy: Secondary | ICD-10-CM | POA: Diagnosis present

## 2020-06-09 DIAGNOSIS — E1165 Type 2 diabetes mellitus with hyperglycemia: Secondary | ICD-10-CM

## 2020-06-09 DIAGNOSIS — E1122 Type 2 diabetes mellitus with diabetic chronic kidney disease: Secondary | ICD-10-CM | POA: Insufficient documentation

## 2020-06-09 LAB — CBC WITH DIFFERENTIAL (CANCER CENTER ONLY)
Abs Immature Granulocytes: 0.06 10*3/uL (ref 0.00–0.07)
Basophils Absolute: 0.1 10*3/uL (ref 0.0–0.1)
Basophils Relative: 1 %
Eosinophils Absolute: 0.1 10*3/uL (ref 0.0–0.5)
Eosinophils Relative: 2 %
HCT: 35 % — ABNORMAL LOW (ref 36.0–46.0)
Hemoglobin: 11.1 g/dL — ABNORMAL LOW (ref 12.0–15.0)
Immature Granulocytes: 1 %
Lymphocytes Relative: 12 %
Lymphs Abs: 1 10*3/uL (ref 0.7–4.0)
MCH: 26.8 pg (ref 26.0–34.0)
MCHC: 31.7 g/dL (ref 30.0–36.0)
MCV: 84.5 fL (ref 80.0–100.0)
Monocytes Absolute: 0.7 10*3/uL (ref 0.1–1.0)
Monocytes Relative: 9 %
Neutro Abs: 6.2 10*3/uL (ref 1.7–7.7)
Neutrophils Relative %: 75 %
Platelet Count: 356 10*3/uL (ref 150–400)
RBC: 4.14 MIL/uL (ref 3.87–5.11)
RDW: 15.7 % — ABNORMAL HIGH (ref 11.5–15.5)
WBC Count: 8.1 10*3/uL (ref 4.0–10.5)
nRBC: 0 % (ref 0.0–0.2)

## 2020-06-09 LAB — CMP (CANCER CENTER ONLY)
ALT: 11 U/L (ref 0–44)
AST: 30 U/L (ref 15–41)
Albumin: 3.1 g/dL — ABNORMAL LOW (ref 3.5–5.0)
Alkaline Phosphatase: 238 U/L — ABNORMAL HIGH (ref 38–126)
Anion gap: 9 (ref 5–15)
BUN: 17 mg/dL (ref 8–23)
CO2: 26 mmol/L (ref 22–32)
Calcium: 9 mg/dL (ref 8.9–10.3)
Chloride: 102 mmol/L (ref 98–111)
Creatinine: 1.14 mg/dL — ABNORMAL HIGH (ref 0.44–1.00)
GFR, Est AFR Am: 53 mL/min — ABNORMAL LOW (ref >60)
GFR, Estimated: 45 mL/min — ABNORMAL LOW (ref >60)
Glucose, Bld: 89 mg/dL (ref 70–99)
Potassium: 3.7 mmol/L (ref 3.5–5.1)
Sodium: 137 mmol/L (ref 135–145)
Total Bilirubin: 0.4 mg/dL (ref 0.3–1.2)
Total Protein: 8.1 g/dL (ref 6.5–8.1)

## 2020-06-09 MED ORDER — LIDOCAINE-PRILOCAINE 2.5-2.5 % EX CREA: 1.0000 "application " | TOPICAL_CREAM | Freq: Every day | CUTANEOUS | 11 refills | Status: DC | PRN

## 2020-06-09 MED ORDER — SODIUM CHLORIDE 0.9% FLUSH
10.0000 mL | Freq: Once | INTRAVENOUS | Status: AC
  Administered 2020-06-09: 10 mL via INTRAVENOUS
  Filled 2020-06-09: qty 10

## 2020-06-09 MED ORDER — PROCHLORPERAZINE MALEATE 10 MG PO TABS: 10.0000 mg | ORAL_TABLET | Freq: Four times a day (QID) | ORAL | 1 refills | Status: DC | PRN

## 2020-06-09 MED ORDER — HEPARIN SOD (PORK) LOCK FLUSH 100 UNIT/ML IV SOLN
500.0000 [IU] | Freq: Once | INTRAVENOUS | Status: AC
  Administered 2020-06-09: 500 [IU] via INTRAVENOUS
  Filled 2020-06-09: qty 5

## 2020-06-09 MED ORDER — ONDANSETRON HCL 8 MG PO TABS: 8.0000 mg | ORAL_TABLET | Freq: Three times a day (TID) | ORAL | 3 refills | Status: DC | PRN

## 2020-06-09 MED ORDER — DEXAMETHASONE 4 MG PO TABS: ORAL_TABLET | ORAL | 0 refills | Status: DC

## 2020-06-09 NOTE — Assessment & Plan Note (Signed)
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

## 2020-06-09 NOTE — Assessment & Plan Note (Signed)
We discussed neoadjuvant chemotherapy approach  We reviewed the NCCN guidelines We discussed the role of chemotherapy. The intent is of curative intent.  We discussed some of the risks, benefits, side-effects of carboplatin & Taxol. Treatment is intravenous, every 3 weeks x 6 cycles  Some of the short term side-effects included, though not limited to, including weight loss, life threatening infections, risk of allergic reactions, need for transfusions of blood products, nausea, vomiting, change in bowel habits, loss of hair, admission to hospital for various reasons, and risks of death.   Long term side-effects are also discussed including risks of infertility, permanent damage to nerve function, hearing loss, chronic fatigue, kidney damage with possibility needing hemodialysis, and rare secondary malignancy including bone marrow disorders.  The patient is aware that the response rates discussed earlier is not guaranteed.  After a long discussion, patient made an informed decision to proceed with the prescribed plan of care.   Patient education material was dispensed. We discussed premedication with dexamethasone before chemotherapy. She has port placement in the hospital I do not plan prophylactic G-CSF support

## 2020-06-09 NOTE — Progress Notes (Signed)
Pharmacist Chemotherapy Monitoring - Initial Assessment    Anticipated start date: 06/14/2020   Regimen:  . Are orders appropriate based on the patient's diagnosis, regimen, and cycle? Yes . Does the plan date match the patient's scheduled date? Yes . Is the sequencing of drugs appropriate? Yes . Are the premedications appropriate for the patient's regimen? Yes . Prior Authorization for treatment is: Approved o If applicable, is the correct biosimilar selected based on the patient's insurance? not applicable  Organ Function and Labs: Marland Kitchen Are dose adjustments needed based on the patient's renal function, hepatic function, or hematologic function? Yes . Are appropriate labs ordered prior to the start of patient's treatment? Yes . Other organ system assessment, if indicated: N/A . The following baseline labs, if indicated, have been ordered: N/A  Dose Assessment: . Are the drug doses appropriate? Yes . Are the following correct: o Drug concentrations Yes o IV fluid compatible with drug Yes o Administration routes Yes o Timing of therapy Yes . If applicable, does the patient have documented access for treatment and/or plans for port-a-cath placement? yes . If applicable, have lifetime cumulative doses been properly documented and assessed? yes Lifetime Dose Tracking  No doses have been documented on this patient for the following tracked chemicals: Doxorubicin, Epirubicin, Idarubicin, Daunorubicin, Mitoxantrone, Bleomycin, Oxaliplatin, Carboplatin, Liposomal Doxorubicin  o   Toxicity Monitoring/Prevention: . The patient has the following take home antiemetics prescribed: Ondansetron, Prochlorperazine and Dexamethasone . The patient has the following take home medications prescribed: N/A . Medication allergies and previous infusion related reactions, if applicable, have been reviewed and addressed. No . The patient's current medication list has been assessed for drug-drug interactions with  their chemotherapy regimen. no significant drug-drug interactions were identified on review.  Order Review: . Are the treatment plan orders signed? Yes . Is the patient scheduled to see a provider prior to their treatment? No  I verify that I have reviewed each item in the above checklist and answered each question accordingly.  Jaelani Posa D 06/09/2020 4:27 PM

## 2020-06-09 NOTE — Assessment & Plan Note (Signed)
She tolerated anticoagulation therapy well without major bleeding complications She need at least 3 months of anticoagulation treatment before we can stop her anticoagulation therapy for interval debulking surgery

## 2020-06-09 NOTE — Progress Notes (Signed)
[facility] OFFICE PROGRESS NOTE  Patient Care Team: [name], MD as PCP - General (Internal Medicine)  ASSESSMENT & PLAN:  Ovarian CA, unspecified laterality [facility]) We discussed neoadjuvant chemotherapy approach  We reviewed the NCCN guidelines We discussed the role of chemotherapy. The intent is of curative intent.  We discussed some of the risks, benefits, side-effects of carboplatin & Taxol. Treatment is intravenous, every 3 weeks x 6 cycles  Some of the short term side-effects included, though not limited to, including weight loss, life threatening infections, risk of allergic reactions, need for transfusions of blood products, nausea, vomiting, change in bowel habits, loss of hair, admission to hospital for various reasons, and risks of death.   Long term side-effects are also discussed including risks of infertility, permanent damage to nerve function, hearing loss, chronic fatigue, kidney damage with possibility needing hemodialysis, and rare secondary malignancy including bone marrow disorders.  The patient is aware that the response rates discussed earlier is not guaranteed.  After a long discussion, patient made an informed decision to proceed with the prescribed plan of care.   Patient education material was dispensed. We discussed premedication with dexamethasone before chemotherapy. 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 We discussed the importance of laxative therapy  Pleural effusion She is not symptomatic She does not need therapeutic paracentesis  Acute pulmonary embolism (HCC) She tolerated anticoagulation therapy well without major bleeding complications She need at least 3 months of anticoagulation treatment before we can stop her anticoagulation therapy for interval debulking surgery  Diabetes mellitus type 2,  uncontrolled ([place]) She has history of uncontrolled diabetes in the past I plan to reduce premedication steroids for her in the future We discussed the importance of dietary modification the day before, the day of and the day after treatment  CKD (chronic kidney disease), stage III She has intermittent acute on chronic renal failure We will observe her renal function closely I will adjust the dose of treatment accordingly  HTN (hypertension) Her blood pressure is borderline low I recommend discontinuation of losartan   No orders of the defined types were placed in this encounter.   All questions were answered. The patient knows to call the clinic with any problems, questions or concerns. The total time spent in the appointment was 30 minutes encounter with patients including review of chart and various tests results, discussions about plan of care and coordination of care plan   [name], MD 06/09/2020 2:50 PM  INTERVAL HISTORY: Please see below for problem oriented charting. 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  CT imaging also revealed abdominal mass.  Subsequent biopsy confirmed diagnosis of ovarian cancer   05/21/2020 Procedure   Successful ultrasound guided diagnostic and therapeutic left thoracentesis yielding 580 cc of pleural fluid.   05/21/2020 Imaging   1. Large saddle pulmonary embolus with CT evidence of right heart strain (RV/LV ratio of 1.6) consistent with at least submassive (intermediate risk) PE. The  presence  of right heart strain has been associated with an increased risk of morbidity and mortality. 2. Large left and small right pleural effusions. 3. Large amount of peritoneal nodularity, consistent with peritoneal carcinomatosis. 4. Large midline pelvic mass, measuring up to 12.9 x 8.7 x 9.7 cm. 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   05/21/2020 Pathology Results   Clinical History: None provided  Specimen Submitted:  A. PLEURAL FLUID,LEFT, THORACENTESIS:    FINAL MICROSCOPIC DIAGNOSIS:  - Reactive mesothelial cells present   05/24/2020 Procedure   1. Successful placement of a right internal jugular approach power injectable Port-A-Cath. The catheter is ready for immediate use. 2. Successful ultrasound-guided paracentesis yielding 2.2 L of serous, slightly blood tinged ascitic fluid.   05/24/2020 Pathology Results   A. OMENTUM, NEEDLE CORE BIOPSY:  -  High-grade carcinoma  -  See comment   COMMENT:   By immunohistochemistry, the neoplastic cells are positive for cytokeratin 7 and PAX 8 but negative for cytokeratin 20, CDX2 and TTF-1. 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    05/30/2020 Cancer Staging   Staging form: Ovary, Fallopian Tube, and Primary Peritoneal Carcinoma, AJCC 8th Edition - Clinical stage from 05/30/2020: FIGO Stage IIIC (cT3, cN0, cM0) - Signed by [name], MD on 05/30/2020     REVIEW OF SYSTEMS:   Constitutional: Denies fevers, chills or abnormal weight loss Eyes: Denies blurriness of vision Ears, nose, mouth, throat, and face: Denies mucositis or sore throat Respiratory: Denies cough, dyspnea or wheezes Cardiovascular: Denies palpitation, chest discomfort  Skin: Denies abnormal skin rashes Lymphatics: Denies new lymphadenopathy or easy bruising Neurological:Denies numbness, tingling or new  weaknesses Behavioral/Psych: Mood is stable, no new changes  All other systems were reviewed with the patient and are negative.  I have reviewed the past medical history, past surgical history, social history and family history with the patient and they are unchanged from previous note.  ALLERGIES:  is allergic to metformin and related.  MEDICATIONS:  Current Outpatient Medications  Medication Sig Dispense Refill  . acetaminophen (TYLENOL) 325 MG tablet Take 650 mg by mouth every 6 (six) hours as needed for mild pain.    Marland Kitchen albuterol (PROVENTIL HFA;VENTOLIN HFA) 108 (90 BASE) MCG/ACT inhaler Inhale 2 puffs into the lungs every 6 (six) hours as needed. For wheezing.    Marland Kitchen apixaban (ELIQUIS) 5 MG TABS tablet Take 1 tablet (5 mg total) by mouth 2 (two) times daily. 60 tablet 0  . cetirizine (ZYRTEC) 10 MG tablet Take 10 mg by mouth daily.    Marland Kitchen dexamethasone (DECADRON) 4 MG tablet Take 2 tabs at the night before chemotherapy, every 3 weeks, by mouth x 6 cycles 12 tablet 0  . empagliflozin (JARDIANCE) 10 MG TABS tablet Take 10 mg by mouth daily.    . furosemide (LASIX) 40 MG tablet Take 40 mg by mouth daily.   11  . glimepiride (AMARYL) 1 MG tablet Take 0.5 mg by mouth daily.    Marland Kitchen levothyroxine (SYNTHROID, LEVOTHROID) 75 MCG tablet Take 75 mcg by mouth daily.    Marland Kitchen lidocaine-prilocaine (EMLA) cream Apply 1 application topically daily as needed. 30 g 11  . omeprazole (PRILOSEC) 20 MG capsule Take 20 mg by mouth daily.    . ondansetron (ZOFRAN) 4 MG tablet Take 4 mg by mouth. Take one tablet by mouth thirty minutes before each meal.    .  ondansetron (ZOFRAN) 8 MG tablet Take 1 tablet (8 mg total) by mouth every 8 (eight) hours as needed for nausea. 30 tablet 3  . polyethylene glycol (MIRALAX / GLYCOLAX) 17 g packet Take 17 g by mouth. BID MTWTF    . prochlorperazine (COMPAZINE) 10 MG tablet Take 1 tablet (10 mg total) by mouth every 6 (six) hours as needed for nausea or vomiting. 30 tablet 1  .  sertraline (ZOLOFT) 50 MG tablet Take 50 mg by mouth daily.  0  . simvastatin (ZOCOR) 40 MG tablet Take 40 mg by mouth daily.  3  . TRADJENTA 5 MG TABS tablet Take 5 mg by mouth daily.  3   No current facility-administered medications for this visit.    PHYSICAL EXAMINATION: ECOG PERFORMANCE STATUS: 2 - Symptomatic, <50% confined to bed  Vitals:   06/09/20 1320  BP: (!) 105/54  Pulse: 69  Resp: 18  Temp: (!) 97.4 F (36.3 C)  SpO2: 98%   Filed Weights   06/09/20 1320  Weight: 130 lb 6.4 oz (59.1 kg)    GENERAL:alert, no distress and comfortable SKIN: skin color, texture, turgor are normal, no rashes or significant lesions EYES: normal, Conjunctiva are pink and non-injected, sclera clear OROPHARYNX:no exudate, no erythema and lips, buccal mucosa, and tongue normal  NECK: supple, thyroid normal size, non-tender, without nodularity LYMPH:  no palpable lymphadenopathy in the cervical, axillary or inguinal LUNGS: She has reduced breath sounds on both lung bases HEART: regular rate & rhythm and no murmurs with mild bilateral lower extremity edema ABDOMEN:abdomen soft, non-tender and normal bowel sounds.  Abdomen is mildly distended Musculoskeletal:no cyanosis of digits and no clubbing  NEURO: alert & oriented x 3 with fluent speech, no focal motor/sensory deficits  LABORATORY DATA:  I have reviewed the data as listed    Component Value Date/Time   NA 137 06/09/2020 1235   K 3.7 06/09/2020 1235   CL 102 06/09/2020 1235   CO2 26 06/09/2020 1235   GLUCOSE 89 06/09/2020 1235   BUN 17 06/09/2020 1235   CREATININE 1.14 (H) 06/09/2020 1235   CALCIUM 9.0 06/09/2020 1235   PROT 8.1 06/09/2020 1235   ALBUMIN 3.1 (L) 06/09/2020 1235   AST 30 06/09/2020 1235   ALT 11 06/09/2020 1235   ALKPHOS 238 (H) 06/09/2020 1235   BILITOT 0.4 06/09/2020 1235   GFRNONAA 45 (L) 06/09/2020 1235   GFRAA 53 (L) 06/09/2020 1235    No results found for: SPEP, UPEP  Lab Results  Component Value  Date   WBC 8.1 06/09/2020   NEUTROABS 6.2 06/09/2020   HGB 11.1 (L) 06/09/2020   HCT 35.0 (L) 06/09/2020   MCV 84.5 06/09/2020   PLT 356 06/09/2020      Chemistry      Component Value Date/Time   NA 137 06/09/2020 1235   K 3.7 06/09/2020 1235   CL 102 06/09/2020 1235   CO2 26 06/09/2020 1235   BUN 17 06/09/2020 1235   CREATININE 1.14 (H) 06/09/2020 1235      Component Value Date/Time   CALCIUM 9.0 06/09/2020 1235   ALKPHOS 238 (H) 06/09/2020 1235   AST 30 06/09/2020 1235   ALT 11 06/09/2020 1235   BILITOT 0.4 06/09/2020 1235

## 2020-06-09 NOTE — Assessment & Plan Note (Signed)
She has history of uncontrolled diabetes in the past I plan to reduce premedication steroids for her in the future We discussed the importance of dietary modification the day before, the day of and the day after treatment

## 2020-06-09 NOTE — Assessment & Plan Note (Signed)
Her blood pressure is borderline low I recommend discontinuation of losartan

## 2020-06-09 NOTE — Assessment & Plan Note (Signed)
She has intermittent acute on chronic renal failure We will observe her renal function closely I will adjust the dose of treatment accordingly

## 2020-06-09 NOTE — Assessment & Plan Note (Signed)
She is not symptomatic She does not need therapeutic paracentesis

## 2020-06-09 NOTE — Progress Notes (Signed)
Met w/ pt to introduce myself as her Arboriculturist.  Pt has 2 insurances so copay assistance isn't needed.  I informed her of the [employer], went over what it covers, gave her the income requirement and an expense sheet.  Pt would like to apply so she will bring proof of income on 06/14/20.  She has my card for any questions or concerns she may have in the future.

## 2020-06-10 LAB — CA 125: Cancer Antigen (CA) 125: 27432 U/mL — ABNORMAL HIGH (ref 0.0–38.1)

## 2020-06-13 ENCOUNTER — Ambulatory Visit: Payer: Medicare Other

## 2020-06-14 ENCOUNTER — Inpatient Hospital Stay: Payer: Medicare Other

## 2020-06-14 ENCOUNTER — Other Ambulatory Visit: Payer: Medicare Other

## 2020-06-14 ENCOUNTER — Encounter: Payer: Self-pay | Admitting: Oncology

## 2020-06-14 ENCOUNTER — Other Ambulatory Visit: Payer: Self-pay

## 2020-06-14 VITALS — BP 111/70 | HR 66 | Temp 97.8°F | Resp 18 | Wt 130.2 lb

## 2020-06-14 DIAGNOSIS — C569 Malignant neoplasm of unspecified ovary: Secondary | ICD-10-CM

## 2020-06-14 DIAGNOSIS — Z5111 Encounter for antineoplastic chemotherapy: Secondary | ICD-10-CM | POA: Diagnosis not present

## 2020-06-14 MED ORDER — SODIUM CHLORIDE 0.9 % IV SOLN
10.0000 mg | Freq: Once | INTRAVENOUS | Status: AC
  Administered 2020-06-14: 10 mg via INTRAVENOUS
  Filled 2020-06-14: qty 10

## 2020-06-14 MED ORDER — SODIUM CHLORIDE 0.9 % IV SOLN
368.4000 mg | Freq: Once | INTRAVENOUS | Status: AC
  Administered 2020-06-14: 370 mg via INTRAVENOUS
  Filled 2020-06-14: qty 37

## 2020-06-14 MED ORDER — PALONOSETRON HCL INJECTION 0.25 MG/5ML
INTRAVENOUS | Status: AC
  Filled 2020-06-14: qty 5

## 2020-06-14 MED ORDER — DIPHENHYDRAMINE HCL 50 MG/ML IJ SOLN
12.5000 mg | Freq: Once | INTRAMUSCULAR | Status: AC
  Administered 2020-06-14: 12.5 mg via INTRAVENOUS

## 2020-06-14 MED ORDER — FAMOTIDINE IN NACL 20-0.9 MG/50ML-% IV SOLN
INTRAVENOUS | Status: AC
  Filled 2020-06-14: qty 50

## 2020-06-14 MED ORDER — SODIUM CHLORIDE 0.9% FLUSH
10.0000 mL | INTRAVENOUS | Status: DC | PRN
  Administered 2020-06-14: 10 mL
  Filled 2020-06-14: qty 10

## 2020-06-14 MED ORDER — PALONOSETRON HCL INJECTION 0.25 MG/5ML
0.2500 mg | Freq: Once | INTRAVENOUS | Status: AC
  Administered 2020-06-14: 0.25 mg via INTRAVENOUS

## 2020-06-14 MED ORDER — HEPARIN SOD (PORK) LOCK FLUSH 100 UNIT/ML IV SOLN
500.0000 [IU] | Freq: Once | INTRAVENOUS | Status: AC | PRN
  Administered 2020-06-14: 500 [IU]
  Filled 2020-06-14: qty 5

## 2020-06-14 MED ORDER — FAMOTIDINE IN NACL 20-0.9 MG/50ML-% IV SOLN
20.0000 mg | Freq: Once | INTRAVENOUS | Status: AC
  Administered 2020-06-14: 20 mg via INTRAVENOUS

## 2020-06-14 MED ORDER — DIPHENHYDRAMINE HCL 50 MG/ML IJ SOLN
INTRAMUSCULAR | Status: AC
  Filled 2020-06-14: qty 1

## 2020-06-14 MED ORDER — SODIUM CHLORIDE 0.9 % IV SOLN
150.0000 mg | Freq: Once | INTRAVENOUS | Status: AC
  Administered 2020-06-14: 150 mg via INTRAVENOUS
  Filled 2020-06-14: qty 150

## 2020-06-14 MED ORDER — SODIUM CHLORIDE 0.9 % IV SOLN
140.0000 mg/m2 | Freq: Once | INTRAVENOUS | Status: AC
  Administered 2020-06-14: 222 mg via INTRAVENOUS
  Filled 2020-06-14: qty 37

## 2020-06-14 MED ORDER — SODIUM CHLORIDE 0.9 % IV SOLN
Freq: Once | INTRAVENOUS | Status: AC
  Filled 2020-06-14: qty 250

## 2020-06-14 NOTE — Progress Notes (Signed)
Met with Amber Gray during her first chemotherapy infusion.  She does not have any questions at this time.  Advised her to call if she with any questions or needs.

## 2020-06-14 NOTE — Patient Instructions (Signed)
[facility] Discharge Instructions for Patients Receiving Chemotherapy  Today you received the following chemotherapy agents: paclitaxel and carboplatin.  To help prevent nausea and vomiting after your treatment, we encourage you to take your nausea medication as directed.  If you develop nausea and vomiting that is not controlled by your nausea medication, call the clinic.   BELOW ARE SYMPTOMS THAT SHOULD BE REPORTED IMMEDIATELY:  *FEVER GREATER THAN 100.5 F  *CHILLS WITH OR WITHOUT FEVER  NAUSEA AND VOMITING THAT IS NOT CONTROLLED WITH YOUR NAUSEA MEDICATION  *UNUSUAL SHORTNESS OF BREATH  *UNUSUAL BRUISING OR BLEEDING  TENDERNESS IN MOUTH AND THROAT WITH OR WITHOUT PRESENCE OF ULCERS  *URINARY PROBLEMS  *BOWEL PROBLEMS  UNUSUAL RASH Items with * indicate a potential emergency and should be followed up as soon as possible.  Feel free to call the clinic should you have any questions or concerns. The clinic phone number is [phone].  Please show the CHEMO ALERT CARD at check-in to the Emergency Department and triage nurse.  Paclitaxel injection What is this medicine? PACLITAXEL (PAK li TAX el) is a chemotherapy drug. It targets fast dividing cells, like cancer cells, and causes these cells to die. This medicine is used to treat ovarian cancer, breast cancer, lung cancer, Kaposi's sarcoma, and other cancers. This medicine may be used for other purposes; ask your health care provider or pharmacist if you have questions. COMMON BRAND NAME(S): Onxol, Taxol What should I tell my health care provider before I take this medicine? They need to know if you have any of these conditions:  history of irregular heartbeat  liver disease  low blood counts, like low white cell, platelet, or red cell counts  lung or breathing disease, like asthma  tingling of the fingers or toes, or other nerve disorder  an unusual or allergic reaction to paclitaxel, alcohol,  polyoxyethylated castor oil, other chemotherapy, other medicines, foods, dyes, or preservatives  pregnant or trying to get pregnant  breast-feeding How should I use this medicine? This drug is given as an infusion into a vein. It is administered in a hospital or clinic by a specially trained health care professional. Talk to your pediatrician regarding the use of this medicine in children. Special care may be needed. Overdosage: If you think you have taken too much of this medicine contact a poison control center or emergency room at once. NOTE: This medicine is only for you. Do not share this medicine with others. What if I miss a dose? It is important not to miss your dose. Call your doctor or health care professional if you are unable to keep an appointment. What may interact with this medicine? Do not take this medicine with any of the following medications:  disulfiram  metronidazole This medicine may also interact with the following medications:  antiviral medicines for hepatitis, HIV or AIDS  certain antibiotics like erythromycin and clarithromycin  certain medicines for fungal infections like ketoconazole and itraconazole  certain medicines for seizures like carbamazepine, phenobarbital, phenytoin  gemfibrozil  nefazodone  rifampin  St. John's wort This list may not describe all possible interactions. Give your health care provider a list of all the medicines, herbs, non-prescription drugs, or dietary supplements you use. Also tell them if you smoke, drink alcohol, or use illegal drugs. Some items may interact with your medicine. What should I watch for while using this medicine? Your condition will be monitored carefully while you are receiving this medicine. You will need important blood   work done while you are taking this medicine. This medicine can cause serious allergic reactions. To reduce your risk you will need to take other medicine(s) before treatment with this  medicine. If you experience allergic reactions like skin rash, itching or hives, swelling of the face, lips, or tongue, tell your doctor or health care professional right away. In some cases, you may be given additional medicines to help with side effects. Follow all directions for their use. This drug may make you feel generally unwell. This is not uncommon, as chemotherapy can affect healthy cells as well as cancer cells. Report any side effects. Continue your course of treatment even though you feel ill unless your doctor tells you to stop. Call your doctor or health care professional for advice if you get a fever, chills or sore throat, or other symptoms of a cold or flu. Do not treat yourself. This drug decreases your body's ability to fight infections. Try to avoid being around people who are sick. This medicine may increase your risk to bruise or bleed. Call your doctor or health care professional if you notice any unusual bleeding. Be careful brushing and flossing your teeth or using a toothpick because you may get an infection or bleed more easily. If you have any dental work done, tell your dentist you are receiving this medicine. Avoid taking products that contain aspirin, acetaminophen, ibuprofen, naproxen, or ketoprofen unless instructed by your doctor. These medicines may hide a fever. Do not become pregnant while taking this medicine. Women should inform their doctor if they wish to become pregnant or think they might be pregnant. There is a potential for serious side effects to an unborn child. Talk to your health care professional or pharmacist for more information. Do not breast-feed an infant while taking this medicine. Men are advised not to father a child while receiving this medicine. This product may contain alcohol. Ask your pharmacist or healthcare provider if this medicine contains alcohol. Be sure to tell all healthcare providers you are taking this medicine. Certain medicines,  like metronidazole and disulfiram, can cause an unpleasant reaction when taken with alcohol. The reaction includes flushing, headache, nausea, vomiting, sweating, and increased thirst. The reaction can last from 30 minutes to several hours. What side effects may I notice from receiving this medicine? Side effects that you should report to your doctor or health care professional as soon as possible:  allergic reactions like skin rash, itching or hives, swelling of the face, lips, or tongue  breathing problems  changes in vision  fast, irregular heartbeat  high or low blood pressure  mouth sores  pain, tingling, numbness in the hands or feet  signs of decreased platelets or bleeding - bruising, pinpoint red spots on the skin, black, tarry stools, blood in the urine  signs of decreased red blood cells - unusually weak or tired, feeling faint or lightheaded, falls  signs of infection - fever or chills, cough, sore throat, pain or difficulty passing urine  signs and symptoms of liver injury like dark yellow or brown urine; general ill feeling or flu-like symptoms; light-colored stools; loss of appetite; nausea; right upper belly pain; unusually weak or tired; yellowing of the eyes or skin  swelling of the ankles, feet, hands  unusually slow heartbeat Side effects that usually do not require medical attention (report to your doctor or health care professional if they continue or are bothersome):  diarrhea  hair loss  loss of appetite  muscle or joint pain    nausea, vomiting  pain, redness, or irritation at site where injected  tiredness This list may not describe all possible side effects. Call your doctor for medical advice about side effects. You may report side effects to FDA at 1-800-FDA-1088. Where should I keep my medicine? This drug is given in a hospital or clinic and will not be stored at home. NOTE: This sheet is a summary. It may not cover all possible information.  If you have questions about this medicine, talk to your doctor, pharmacist, or health care provider.  2020 Elsevier/Gold Standard (2017-05-21 13:14:55)  Carboplatin injection What is this medicine? CARBOPLATIN (KAR boe pla tin) is a chemotherapy drug. It targets fast dividing cells, like cancer cells, and causes these cells to die. This medicine is used to treat ovarian cancer and many other cancers. This medicine may be used for other purposes; ask your health care provider or pharmacist if you have questions. COMMON BRAND NAME(S): Paraplatin What should I tell my health care provider before I take this medicine? They need to know if you have any of these conditions:  blood disorders  hearing problems  kidney disease  recent or ongoing radiation therapy  an unusual or allergic reaction to carboplatin, cisplatin, other chemotherapy, other medicines, foods, dyes, or preservatives  pregnant or trying to get pregnant  breast-feeding How should I use this medicine? This drug is usually given as an infusion into a vein. It is administered in a hospital or clinic by a specially trained health care professional. Talk to your pediatrician regarding the use of this medicine in children. Special care may be needed. Overdosage: If you think you have taken too much of this medicine contact a poison control center or emergency room at once. NOTE: This medicine is only for you. Do not share this medicine with others. What if I miss a dose? It is important not to miss a dose. Call your doctor or health care professional if you are unable to keep an appointment. What may interact with this medicine?  medicines for seizures  medicines to increase blood counts like filgrastim, pegfilgrastim, sargramostim  some antibiotics like amikacin, gentamicin, neomycin, streptomycin, tobramycin  vaccines Talk to your doctor or health care professional before taking any of these  medicines:  acetaminophen  aspirin  ibuprofen  ketoprofen  naproxen This list may not describe all possible interactions. Give your health care provider a list of all the medicines, herbs, non-prescription drugs, or dietary supplements you use. Also tell them if you smoke, drink alcohol, or use illegal drugs. Some items may interact with your medicine. What should I watch for while using this medicine? Your condition will be monitored carefully while you are receiving this medicine. You will need important blood work done while you are taking this medicine. This drug may make you feel generally unwell. This is not uncommon, as chemotherapy can affect healthy cells as well as cancer cells. Report any side effects. Continue your course of treatment even though you feel ill unless your doctor tells you to stop. In some cases, you may be given additional medicines to help with side effects. Follow all directions for their use. Call your doctor or health care professional for advice if you get a fever, chills or sore throat, or other symptoms of a cold or flu. Do not treat yourself. This drug decreases your body's ability to fight infections. Try to avoid being around people who are sick. This medicine may increase your risk to bruise or bleed.   Call your doctor or health care professional if you notice any unusual bleeding. Be careful brushing and flossing your teeth or using a toothpick because you may get an infection or bleed more easily. If you have any dental work done, tell your dentist you are receiving this medicine. Avoid taking products that contain aspirin, acetaminophen, ibuprofen, naproxen, or ketoprofen unless instructed by your doctor. These medicines may hide a fever. Do not become pregnant while taking this medicine. Women should inform their doctor if they wish to become pregnant or think they might be pregnant. There is a potential for serious side effects to an unborn child. Talk  to your health care professional or pharmacist for more information. Do not breast-feed an infant while taking this medicine. What side effects may I notice from receiving this medicine? Side effects that you should report to your doctor or health care professional as soon as possible:  allergic reactions like skin rash, itching or hives, swelling of the face, lips, or tongue  signs of infection - fever or chills, cough, sore throat, pain or difficulty passing urine  signs of decreased platelets or bleeding - bruising, pinpoint red spots on the skin, black, tarry stools, nosebleeds  signs of decreased red blood cells - unusually weak or tired, fainting spells, lightheadedness  breathing problems  changes in hearing  changes in vision  chest pain  high blood pressure  low blood counts - This drug may decrease the number of white blood cells, red blood cells and platelets. You may be at increased risk for infections and bleeding.  nausea and vomiting  pain, swelling, redness or irritation at the injection site  pain, tingling, numbness in the hands or feet  problems with balance, talking, walking  trouble passing urine or change in the amount of urine Side effects that usually do not require medical attention (report to your doctor or health care professional if they continue or are bothersome):  hair loss  loss of appetite  metallic taste in the mouth or changes in taste This list may not describe all possible side effects. Call your doctor for medical advice about side effects. You may report side effects to FDA at 1-800-FDA-1088. Where should I keep my medicine? This drug is given in a hospital or clinic and will not be stored at home. NOTE: This sheet is a summary. It may not cover all possible information. If you have questions about this medicine, talk to your doctor, pharmacist, or health care provider.  2020 Elsevier/Gold Standard (2007-12-23 14:38:05)     

## 2020-06-15 ENCOUNTER — Telehealth: Payer: Self-pay | Admitting: *Deleted

## 2020-06-22 ENCOUNTER — Inpatient Hospital Stay (HOSPITAL_BASED_OUTPATIENT_CLINIC_OR_DEPARTMENT_OTHER): Payer: Medicare Other | Admitting: Hematology and Oncology

## 2020-06-22 ENCOUNTER — Other Ambulatory Visit: Payer: Self-pay

## 2020-06-22 ENCOUNTER — Encounter: Payer: Self-pay | Admitting: Hematology and Oncology

## 2020-06-22 DIAGNOSIS — C569 Malignant neoplasm of unspecified ovary: Secondary | ICD-10-CM

## 2020-06-22 DIAGNOSIS — R600 Localized edema: Secondary | ICD-10-CM

## 2020-06-22 DIAGNOSIS — I2692 Saddle embolus of pulmonary artery without acute cor pulmonale: Secondary | ICD-10-CM | POA: Diagnosis not present

## 2020-06-22 DIAGNOSIS — J90 Pleural effusion, not elsewhere classified: Secondary | ICD-10-CM

## 2020-06-22 DIAGNOSIS — R110 Nausea: Secondary | ICD-10-CM

## 2020-06-22 DIAGNOSIS — Z5111 Encounter for antineoplastic chemotherapy: Secondary | ICD-10-CM | POA: Diagnosis not present

## 2020-06-22 NOTE — Assessment & Plan Note (Signed)
Overall, she tolerated treatment well except for some nausea, persistent shortness of breath and leg swelling I recommend continue aggressive supportive care and physical therapy at home I will see her back prior to cycle 2 of treatment as scheduled

## 2020-06-22 NOTE — Assessment & Plan Note (Signed)
She has chronic nausea She denies recent constipation I recommend she takes her antiemetics as prescribed

## 2020-06-22 NOTE — Assessment & Plan Note (Signed)
She has persistent bilateral pleural effusion, worse on the left compared to the right Her oxygen saturation is adequate For now, I do not recommend thoracentesis

## 2020-06-22 NOTE — Assessment & Plan Note (Signed)
This could be related to low albumin status and reduced mobility I recommend elevating her legs For now, I do not recommend aggressive diuretic therapy

## 2020-06-22 NOTE — Assessment & Plan Note (Signed)
She tolerated anticoagulation therapy well without major bleeding complications She need at least 3 months of anticoagulation treatment before we can stop her anticoagulation therapy for interval debulking surgery

## 2020-06-22 NOTE — Progress Notes (Signed)
[facility] OFFICE PROGRESS NOTE  Patient Care Team: [name], MD as PCP - General (Internal Medicine)  ASSESSMENT & PLAN:  Ovarian CA, unspecified laterality ([place]) Overall, she tolerated treatment well except for some nausea, persistent shortness of breath and leg swelling I recommend continue aggressive supportive care and physical therapy at home I will see her back prior to cycle 2 of treatment as scheduled  Acute pulmonary embolism ([place]) She tolerated anticoagulation therapy well without major bleeding complications She need at least 3 months of anticoagulation treatment before we can stop her anticoagulation therapy for interval debulking surgery  Pleural effusion She has persistent bilateral pleural effusion, worse on the left compared to the right Her oxygen saturation is adequate For now, I do not recommend thoracentesis  Bilateral leg edema This could be related to low albumin status and reduced mobility I recommend elevating her legs For now, I do not recommend aggressive diuretic therapy  Chronic nausea She has chronic nausea She denies recent constipation I recommend she takes her antiemetics as prescribed   No orders of the defined types were placed in this encounter.   All questions were answered. The patient knows to call the clinic with any problems, questions or concerns. The total time spent in the appointment was 25 minutes encounter with patients including review of chart and various tests results, discussions about plan of care and coordination of care plan   [name], MD 06/22/2020 3:13 PM  INTERVAL HISTORY: Please see below for problem oriented charting. 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  CT imaging also revealed abdominal mass.  Subsequent biopsy confirmed diagnosis of ovarian cancer   05/21/2020 Procedure   Successful ultrasound guided diagnostic and therapeutic left thoracentesis yielding 580 cc of pleural fluid.   05/21/2020 Imaging   1. Large saddle pulmonary embolus with CT evidence of right heart strain (RV/LV ratio of 1.6) consistent with at least submassive (intermediate risk) PE. The presence of right heart strain has been associated with an increased risk of morbidity and mortality. 2. Large left and small right pleural effusions. 3. Large amount of peritoneal nodularity, consistent with peritoneal carcinomatosis. 4. Large midline pelvic mass, measuring up to 12.9 x 8.7 x 9.7 cm. 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   05/21/2020 Pathology Results   Clinical History: None provided  Specimen Submitted:  A. PLEURAL FLUID,LEFT, THORACENTESIS:    FINAL MICROSCOPIC DIAGNOSIS:  - Reactive mesothelial cells present   05/24/2020 Procedure   1. Successful placement of a right internal jugular approach power injectable Port-A-Cath. The catheter is ready for immediate use. 2. Successful ultrasound-guided paracentesis yielding 2.2 L of serous, slightly blood tinged ascitic fluid.   05/24/2020 Pathology Results   A. OMENTUM, NEEDLE CORE BIOPSY:  -  High-grade carcinoma  -  See comment   COMMENT:  By immunohistochemistry, the neoplastic cells are positive for cytokeratin 7 and PAX 8 but negative for cytokeratin  20, CDX2 and TTF-1. 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    05/30/2020 Cancer Staging   Staging form: Ovary, Fallopian Tube, and Primary Peritoneal Carcinoma, AJCC 8th Edition - Clinical stage from 05/30/2020: FIGO Stage IIIC (cT3, cN0, cM0) - Signed by [name], MD on 05/30/2020   06/10/2020 Tumor Marker   Patient's tumor was tested for the following markers: CA-125. Results of the tumor marker test revealed [phone].   06/14/2020 -  Chemotherapy   The patient had carboplatin and taxol for chemotherapy treatment.       REVIEW OF SYSTEMS:   Constitutional: Denies fevers, chills  Eyes: Denies blurriness of vision Ears, nose, mouth, throat, and face: Denies mucositis or sore throat Skin: Denies abnormal skin rashes Lymphatics: Denies new lymphadenopathy or easy bruising Neurological:Denies numbness, tingling or new weaknesses Behavioral/Psych: Mood is stable, no new changes  All other systems were reviewed with the patient and are negative.  I have reviewed the past medical history, past surgical history, social history and family history with the patient and they are unchanged from previous note.  ALLERGIES:  is allergic to metformin and related.  MEDICATIONS:  Current Outpatient Medications  Medication Sig Dispense Refill  . acetaminophen (TYLENOL) 325 MG tablet Take 650 mg by mouth every 6 (six) hours as needed for mild pain.    Marland Kitchen albuterol (PROVENTIL HFA;VENTOLIN HFA) 108 (90 BASE) MCG/ACT inhaler Inhale 2 puffs into the lungs every 6 (six) hours as needed. For wheezing.    Marland Kitchen apixaban (ELIQUIS) 5 MG TABS tablet Take 1 tablet (5 mg total) by mouth 2 (two) times daily. 60 tablet 0  . cetirizine (ZYRTEC) 10 MG tablet Take 10 mg by mouth daily.    Marland Kitchen dexamethasone (DECADRON) 4 MG tablet Take 2 tabs at the night before chemotherapy, every 3 weeks, by mouth x 6 cycles 12 tablet 0  . empagliflozin (JARDIANCE)  10 MG TABS tablet Take 10 mg by mouth daily.    . furosemide (LASIX) 40 MG tablet Take 40 mg by mouth daily.   11  . glimepiride (AMARYL) 1 MG tablet Take 0.5 mg by mouth daily.    Marland Kitchen levothyroxine (SYNTHROID, LEVOTHROID) 75 MCG tablet Take 75 mcg by mouth daily.    Marland Kitchen lidocaine-prilocaine (EMLA) cream Apply 1 application topically daily as needed. 30 g 11  . omeprazole (PRILOSEC) 20 MG capsule Take 20 mg by mouth daily.    . ondansetron (ZOFRAN) 4 MG tablet Take 4 mg by mouth. Take one tablet by mouth thirty minutes before each meal.    . ondansetron (ZOFRAN) 8 MG tablet Take 1 tablet (8 mg total) by mouth every 8 (eight) hours as needed for nausea. 30 tablet 3  . polyethylene glycol (MIRALAX / GLYCOLAX) 17 g packet Take 17 g by mouth. BID MTWTF    . prochlorperazine (COMPAZINE) 10 MG tablet Take 1 tablet (10 mg total) by mouth every 6 (six) hours as needed for nausea or vomiting. 30 tablet 1  . sertraline (ZOLOFT) 50 MG tablet Take 50 mg by mouth daily.  0  . simvastatin (ZOCOR) 40 MG tablet Take 40 mg by mouth daily.  3  . TRADJENTA 5 MG TABS tablet Take 5 mg by mouth daily.  3   No current facility-administered medications for this visit.    PHYSICAL EXAMINATION: ECOG PERFORMANCE STATUS:  1 - Symptomatic but completely ambulatory  Vitals:   06/22/20 1427  BP: 131/61  Pulse: 94  Resp: 18  Temp: 97.9 F (36.6 C)  SpO2: 99%   Filed Weights   06/22/20 1427  Weight: 127 lb 3.2 oz (57.7 kg)    GENERAL:alert, no distress and comfortable SKIN: skin color, texture, turgor are normal, no rashes or significant lesions EYES: normal, Conjunctiva are pink and non-injected, sclera clear OROPHARYNX:no exudate, no erythema and lips, buccal mucosa, and tongue normal  NECK: supple, thyroid normal size, non-tender, without nodularity LYMPH:  no palpable lymphadenopathy in the cervical, axillary or inguinal LUNGS: Reduced breath sounds on both lung bases  HEART: regular rate & rhythm and no  murmurs with bilateral lower extremity edema ABDOMEN:abdomen soft, distended with mild ascites Musculoskeletal:no cyanosis of digits and no clubbing  NEURO: alert & oriented x 3 with fluent speech, no focal motor/sensory deficits  LABORATORY DATA:  I have reviewed the data as listed    Component Value Date/Time   NA 137 06/09/2020 1235   K 3.7 06/09/2020 1235   CL 102 06/09/2020 1235   CO2 26 06/09/2020 1235   GLUCOSE 89 06/09/2020 1235   BUN 17 06/09/2020 1235   CREATININE 1.14 (H) 06/09/2020 1235   CALCIUM 9.0 06/09/2020 1235   PROT 8.1 06/09/2020 1235   ALBUMIN 3.1 (L) 06/09/2020 1235   AST 30 06/09/2020 1235   ALT 11 06/09/2020 1235   ALKPHOS 238 (H) 06/09/2020 1235   BILITOT 0.4 06/09/2020 1235   GFRNONAA 45 (L) 06/09/2020 1235   GFRAA 53 (L) 06/09/2020 1235    No results found for: SPEP, UPEP  Lab Results  Component Value Date   WBC 8.1 06/09/2020   NEUTROABS 6.2 06/09/2020   HGB 11.1 (L) 06/09/2020   HCT 35.0 (L) 06/09/2020   MCV 84.5 06/09/2020   PLT 356 06/09/2020      Chemistry      Component Value Date/Time   NA 137 06/09/2020 1235   K 3.7 06/09/2020 1235   CL 102 06/09/2020 1235   CO2 26 06/09/2020 1235   BUN 17 06/09/2020 1235   CREATININE 1.14 (H) 06/09/2020 1235      Component Value Date/Time   CALCIUM 9.0 06/09/2020 1235   ALKPHOS 238 (H) 06/09/2020 1235   AST 30 06/09/2020 1235   ALT 11 06/09/2020 1235   BILITOT 0.4 06/09/2020 1235

## 2020-07-04 ENCOUNTER — Encounter: Payer: Self-pay | Admitting: Hematology and Oncology

## 2020-07-04 ENCOUNTER — Other Ambulatory Visit: Payer: Self-pay

## 2020-07-04 ENCOUNTER — Telehealth: Payer: Self-pay | Admitting: Hematology and Oncology

## 2020-07-04 ENCOUNTER — Inpatient Hospital Stay: Payer: Medicare Other

## 2020-07-04 ENCOUNTER — Inpatient Hospital Stay: Payer: Medicare Other | Attending: Hematology and Oncology | Admitting: Hematology and Oncology

## 2020-07-04 ENCOUNTER — Other Ambulatory Visit: Payer: Self-pay | Admitting: Hematology and Oncology

## 2020-07-04 DIAGNOSIS — E1165 Type 2 diabetes mellitus with hyperglycemia: Secondary | ICD-10-CM

## 2020-07-04 DIAGNOSIS — Z23 Encounter for immunization: Secondary | ICD-10-CM | POA: Diagnosis not present

## 2020-07-04 DIAGNOSIS — I2692 Saddle embolus of pulmonary artery without acute cor pulmonale: Secondary | ICD-10-CM | POA: Diagnosis not present

## 2020-07-04 DIAGNOSIS — C569 Malignant neoplasm of unspecified ovary: Secondary | ICD-10-CM

## 2020-07-04 DIAGNOSIS — R110 Nausea: Secondary | ICD-10-CM | POA: Diagnosis not present

## 2020-07-04 DIAGNOSIS — D61818 Other pancytopenia: Secondary | ICD-10-CM | POA: Diagnosis not present

## 2020-07-04 DIAGNOSIS — J90 Pleural effusion, not elsewhere classified: Secondary | ICD-10-CM

## 2020-07-04 DIAGNOSIS — N183 Chronic kidney disease, stage 3 unspecified: Secondary | ICD-10-CM | POA: Diagnosis not present

## 2020-07-04 DIAGNOSIS — Z5111 Encounter for antineoplastic chemotherapy: Secondary | ICD-10-CM | POA: Diagnosis not present

## 2020-07-04 DIAGNOSIS — I2699 Other pulmonary embolism without acute cor pulmonale: Secondary | ICD-10-CM | POA: Insufficient documentation

## 2020-07-04 DIAGNOSIS — R634 Abnormal weight loss: Secondary | ICD-10-CM | POA: Insufficient documentation

## 2020-07-04 LAB — CMP (CANCER CENTER ONLY)
ALT: 15 U/L (ref 0–44)
AST: 33 U/L (ref 15–41)
Albumin: 3.3 g/dL — ABNORMAL LOW (ref 3.5–5.0)
Alkaline Phosphatase: 207 U/L — ABNORMAL HIGH (ref 38–126)
Anion gap: 12 (ref 5–15)
BUN: 14 mg/dL (ref 8–23)
CO2: 28 mmol/L (ref 22–32)
Calcium: 9.3 mg/dL (ref 8.9–10.3)
Chloride: 98 mmol/L (ref 98–111)
Creatinine: 1.03 mg/dL — ABNORMAL HIGH (ref 0.44–1.00)
GFR, Est AFR Am: 59 mL/min — ABNORMAL LOW (ref >60)
GFR, Estimated: 51 mL/min — ABNORMAL LOW (ref >60)
Glucose, Bld: 113 mg/dL — ABNORMAL HIGH (ref 70–99)
Potassium: 3.2 mmol/L — ABNORMAL LOW (ref 3.5–5.1)
Sodium: 138 mmol/L (ref 135–145)
Total Bilirubin: 0.3 mg/dL (ref 0.3–1.2)
Total Protein: 8.1 g/dL (ref 6.5–8.1)

## 2020-07-04 LAB — CBC WITH DIFFERENTIAL (CANCER CENTER ONLY)
Abs Immature Granulocytes: 0.04 10*3/uL (ref 0.00–0.07)
Basophils Absolute: 0 10*3/uL (ref 0.0–0.1)
Basophils Relative: 1 %
Eosinophils Absolute: 0 10*3/uL (ref 0.0–0.5)
Eosinophils Relative: 1 %
HCT: 31.7 % — ABNORMAL LOW (ref 36.0–46.0)
Hemoglobin: 10.3 g/dL — ABNORMAL LOW (ref 12.0–15.0)
Immature Granulocytes: 1 %
Lymphocytes Relative: 20 %
Lymphs Abs: 0.8 10*3/uL (ref 0.7–4.0)
MCH: 27.1 pg (ref 26.0–34.0)
MCHC: 32.5 g/dL (ref 30.0–36.0)
MCV: 83.4 fL (ref 80.0–100.0)
Monocytes Absolute: 0.5 10*3/uL (ref 0.1–1.0)
Monocytes Relative: 12 %
Neutro Abs: 2.8 10*3/uL (ref 1.7–7.7)
Neutrophils Relative %: 65 %
Platelet Count: 142 10*3/uL — ABNORMAL LOW (ref 150–400)
RBC: 3.8 MIL/uL — ABNORMAL LOW (ref 3.87–5.11)
RDW: 17.4 % — ABNORMAL HIGH (ref 11.5–15.5)
WBC Count: 4.3 10*3/uL (ref 4.0–10.5)
nRBC: 0 % (ref 0.0–0.2)

## 2020-07-04 MED ORDER — SODIUM CHLORIDE 0.9% FLUSH
10.0000 mL | Freq: Once | INTRAVENOUS | Status: AC
  Administered 2020-07-04: 10 mL
  Filled 2020-07-04: qty 10

## 2020-07-04 MED ORDER — HEPARIN SOD (PORK) LOCK FLUSH 100 UNIT/ML IV SOLN
500.0000 [IU] | Freq: Once | INTRAVENOUS | Status: AC
  Administered 2020-07-04: 500 [IU]
  Filled 2020-07-04: qty 5

## 2020-07-04 NOTE — Assessment & Plan Note (Signed)
She tolerated anticoagulation therapy well without major bleeding complications She need at least 3 months of anticoagulation treatment before we can stop her anticoagulation therapy for interval debulking surgery

## 2020-07-04 NOTE — Assessment & Plan Note (Signed)
I recommend reduce oral premed to be taken only the night before chemotherapy.

## 2020-07-04 NOTE — Assessment & Plan Note (Signed)
She has persistent bilateral pleural effusion, worse on the left compared to the right Her oxygen saturation is adequate For now, I do not recommend thoracentesis

## 2020-07-04 NOTE — Assessment & Plan Note (Signed)
She has intermittent acute on chronic renal failure We will observe her renal function closely I will adjust the dose of treatment accordingly

## 2020-07-04 NOTE — Telephone Encounter (Signed)
Scheduled per 10/04 schedule message, patient has received updated calender.

## 2020-07-04 NOTE — Assessment & Plan Note (Signed)
Overall, she has positive response to therapy Her recent weight loss is due to reduction in abdominal ascites and leg swelling We will proceed with treatment with dose adjustment due to recent weight loss My plan would be to proceed with 3 cycles of treatment before repeat CT imaging

## 2020-07-04 NOTE — Progress Notes (Signed)
[place] OFFICE PROGRESS NOTE  Patient Care Team: [name], MD as PCP - General (Internal Medicine)  ASSESSMENT & PLAN:  Ovarian CA, unspecified laterality ([place]) Overall, she has positive response to therapy Her recent weight loss is due to reduction in abdominal ascites and leg swelling We will proceed with treatment with dose adjustment due to recent weight loss My plan would be to proceed with 3 cycles of treatment before repeat CT imaging  Acute pulmonary embolism ([place]) She tolerated anticoagulation therapy well without major bleeding complications She need at least 3 months of anticoagulation treatment before we can stop her anticoagulation therapy for interval debulking surgery  CKD (chronic kidney disease), stage III She has intermittent acute on chronic renal failure We will observe her renal function closely I will adjust the dose of treatment accordingly  Diabetes mellitus type 2, uncontrolled ([place]) I recommend reduce oral premed to be taken only the night before chemotherapy.  Pleural effusion She has persistent bilateral pleural effusion, worse on the left compared to the right Her oxygen saturation is adequate For now, I do not recommend thoracentesis   No orders of the defined types were placed in this encounter.   All questions were answered. The patient knows to call the clinic with any problems, questions or concerns. The total time spent in the appointment was 30 minutes encounter with patients including review of chart and various tests results, discussions about plan of care and coordination of care plan   [name], MD 07/04/2020 1:58 PM  INTERVAL HISTORY: Please see below for problem oriented charting. 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  CT imaging also revealed abdominal mass.  Subsequent biopsy confirmed diagnosis of ovarian cancer   05/21/2020 Procedure   Successful ultrasound guided diagnostic and therapeutic left thoracentesis yielding 580 cc of pleural fluid.   05/21/2020 Imaging   1. Large saddle pulmonary embolus with CT evidence of right heart strain (RV/LV ratio of 1.6) consistent with at least submassive (intermediate risk) PE. The presence of right heart strain has been associated with an increased risk of morbidity and mortality. 2. Large left and small right pleural effusions. 3. Large amount of peritoneal nodularity, consistent with peritoneal carcinomatosis. 4. Large midline pelvic mass, measuring up to 12.9 x 8.7 x 9.7 cm. 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   05/21/2020 Pathology Results   Clinical History: None provided  Specimen Submitted:  A. PLEURAL FLUID,LEFT, THORACENTESIS:    FINAL MICROSCOPIC DIAGNOSIS:  - Reactive mesothelial cells present   05/24/2020 Procedure   1. Successful placement of a right internal jugular approach power injectable Port-A-Cath. The catheter is ready for immediate use. 2. Successful ultrasound-guided paracentesis yielding 2.2 L of serous, slightly blood tinged ascitic fluid.   05/24/2020 Pathology Results   A. OMENTUM, NEEDLE CORE BIOPSY:  -  High-grade carcinoma  -  See comment   COMMENT:  By immunohistochemistry, the neoplastic cells are positive for cytokeratin 7 and PAX 8 but negative for cytokeratin 20, CDX2 and  TTF-1. 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    05/30/2020 Cancer Staging   Staging form: Ovary, Fallopian Tube, and Primary Peritoneal Carcinoma, AJCC 8th Edition - Clinical stage from 05/30/2020: FIGO Stage IIIC (cT3, cN0, cM0) - Signed by [name], MD on 05/30/2020   06/10/2020 Tumor Marker   Patient's tumor was tested for the following markers: CA-125. Results of the tumor marker test revealed [id number].   06/14/2020 -  Chemotherapy   The patient had carboplatin and taxol for chemotherapy treatment.       REVIEW OF SYSTEMS:   Constitutional: Denies fevers, chills Eyes: Denies blurriness of vision Ears, nose, mouth, throat, and face: Denies mucositis or sore throat Respiratory: Denies cough, dyspnea or wheezes Cardiovascular: Denies palpitation, chest discomfort or lower extremity swelling Gastrointestinal:  Denies nausea, heartburn or change in bowel habits Skin: Denies abnormal skin rashes Lymphatics: Denies new lymphadenopathy or easy bruising Neurological:Denies numbness, tingling or new weaknesses Behavioral/Psych: Mood is stable, no new changes  All other systems were reviewed with the patient and are negative.  I have reviewed the past medical history, past surgical history, social history and family history with the patient and they are unchanged from previous note.  ALLERGIES:  is allergic to metformin and related.  MEDICATIONS:  Current Outpatient Medications  Medication Sig Dispense Refill  . acetaminophen (TYLENOL) 325 MG tablet Take 650 mg by mouth every 6 (six) hours as needed for mild pain.    Marland Kitchen albuterol (PROVENTIL HFA;VENTOLIN HFA) 108 (90 BASE) MCG/ACT inhaler Inhale 2 puffs into the lungs every 6 (six) hours as needed. For wheezing.    Marland Kitchen apixaban (ELIQUIS) 5 MG TABS tablet Take 1 tablet (5 mg total) by mouth 2 (two) times daily. 60 tablet 0  . cetirizine (ZYRTEC) 10 MG tablet Take  10 mg by mouth daily.    Marland Kitchen dexamethasone (DECADRON) 4 MG tablet Take 2 tabs at the night before chemotherapy, every 3 weeks, by mouth x 6 cycles 12 tablet 0  . empagliflozin (JARDIANCE) 10 MG TABS tablet Take 10 mg by mouth daily.    . furosemide (LASIX) 40 MG tablet Take 40 mg by mouth daily.   11  . glimepiride (AMARYL) 1 MG tablet Take 0.5 mg by mouth daily.    Marland Kitchen levothyroxine (SYNTHROID, LEVOTHROID) 75 MCG tablet Take 75 mcg by mouth daily.    Marland Kitchen lidocaine-prilocaine (EMLA) cream Apply 1 application topically daily as needed. 30 g 11  . omeprazole (PRILOSEC) 20 MG capsule Take 20 mg by mouth daily.    . ondansetron (ZOFRAN) 4 MG tablet Take 4 mg by mouth. Take one tablet by mouth thirty minutes before each meal.    . ondansetron (ZOFRAN) 8 MG tablet Take 1 tablet (8 mg total) by mouth every 8 (eight) hours as needed for nausea. 30 tablet 3  . polyethylene glycol (MIRALAX / GLYCOLAX) 17 g packet Take 17 g by mouth. BID MTWTF    . prochlorperazine (COMPAZINE) 10 MG tablet Take 1 tablet (10 mg total) by mouth every 6 (six) hours as needed for nausea or vomiting. 30 tablet 1  . sertraline (ZOLOFT) 50 MG tablet Take 50 mg by mouth daily.  0  . simvastatin (ZOCOR) 40 MG tablet Take 40 mg by mouth daily.  3  . TRADJENTA 5 MG TABS tablet Take  5 mg by mouth daily.  3   No current facility-administered medications for this visit.    PHYSICAL EXAMINATION: ECOG PERFORMANCE STATUS: 2 - Symptomatic, <50% confined to bed  Vitals:   07/04/20 1227  BP: 120/68  Pulse: 76  Resp: 18  Temp: (!) 97.3 F (36.3 C)  SpO2: 96%   Filed Weights   07/04/20 1227  Weight: 121 lb 9.6 oz (55.2 kg)    GENERAL:alert, no distress and comfortable SKIN: skin color, texture, turgor are normal, no rashes or significant lesions EYES: normal, Conjunctiva are pink and non-injected, sclera clear OROPHARYNX:no exudate, no erythema and lips, buccal mucosa, and tongue normal  NECK: supple, thyroid normal size,  non-tender, without nodularity LYMPH:  no palpable lymphadenopathy in the cervical, axillary or inguinal LUNGS: Reduced breath sounds in both lungs.  Persistent pleural effusion on the left lung base HEART: regular rate & rhythm and no murmurs mild bilateral lower extremity edema ABDOMEN:abdomen soft, non-tender and normal bowel sounds.  Abdomen less distended but with persistent ascites Musculoskeletal:no cyanosis of digits and no clubbing  NEURO: alert & oriented x 3 with fluent speech, no focal motor/sensory deficits  LABORATORY DATA:  I have reviewed the data as listed    Component Value Date/Time   NA 138 07/04/2020 1217   K 3.2 (L) 07/04/2020 1217   CL 98 07/04/2020 1217   CO2 28 07/04/2020 1217   GLUCOSE 113 (H) 07/04/2020 1217   BUN 14 07/04/2020 1217   CREATININE 1.03 (H) 07/04/2020 1217   CALCIUM 9.3 07/04/2020 1217   PROT 8.1 07/04/2020 1217   ALBUMIN 3.3 (L) 07/04/2020 1217   AST 33 07/04/2020 1217   ALT 15 07/04/2020 1217   ALKPHOS 207 (H) 07/04/2020 1217   BILITOT 0.3 07/04/2020 1217   GFRNONAA 51 (L) 07/04/2020 1217   GFRAA 59 (L) 07/04/2020 1217    No results found for: SPEP, UPEP  Lab Results  Component Value Date   WBC 4.3 07/04/2020   NEUTROABS 2.8 07/04/2020   HGB 10.3 (L) 07/04/2020   HCT 31.7 (L) 07/04/2020   MCV 83.4 07/04/2020   PLT 142 (L) 07/04/2020      Chemistry      Component Value Date/Time   NA 138 07/04/2020 1217   K 3.2 (L) 07/04/2020 1217   CL 98 07/04/2020 1217   CO2 28 07/04/2020 1217   BUN 14 07/04/2020 1217   CREATININE 1.03 (H) 07/04/2020 1217      Component Value Date/Time   CALCIUM 9.3 07/04/2020 1217   ALKPHOS 207 (H) 07/04/2020 1217   AST 33 07/04/2020 1217   ALT 15 07/04/2020 1217   BILITOT 0.3 07/04/2020 1217

## 2020-07-05 ENCOUNTER — Inpatient Hospital Stay: Payer: Medicare Other

## 2020-07-05 ENCOUNTER — Other Ambulatory Visit: Payer: Self-pay

## 2020-07-05 VITALS — BP 133/79 | HR 89 | Temp 97.7°F | Resp 16

## 2020-07-05 DIAGNOSIS — C569 Malignant neoplasm of unspecified ovary: Secondary | ICD-10-CM | POA: Diagnosis present

## 2020-07-05 DIAGNOSIS — R110 Nausea: Secondary | ICD-10-CM | POA: Diagnosis not present

## 2020-07-05 DIAGNOSIS — I2699 Other pulmonary embolism without acute cor pulmonale: Secondary | ICD-10-CM | POA: Diagnosis not present

## 2020-07-05 DIAGNOSIS — Z23 Encounter for immunization: Secondary | ICD-10-CM

## 2020-07-05 DIAGNOSIS — R634 Abnormal weight loss: Secondary | ICD-10-CM | POA: Diagnosis not present

## 2020-07-05 DIAGNOSIS — Z5111 Encounter for antineoplastic chemotherapy: Secondary | ICD-10-CM | POA: Diagnosis not present

## 2020-07-05 DIAGNOSIS — D61818 Other pancytopenia: Secondary | ICD-10-CM | POA: Diagnosis not present

## 2020-07-05 LAB — CA 125: Cancer Antigen (CA) 125: 18334 U/mL — ABNORMAL HIGH (ref 0.0–38.1)

## 2020-07-05 MED ORDER — SODIUM CHLORIDE 0.9 % IV SOLN
150.0000 mg | Freq: Once | INTRAVENOUS | Status: AC
  Administered 2020-07-05: 150 mg via INTRAVENOUS
  Filled 2020-07-05: qty 150

## 2020-07-05 MED ORDER — FAMOTIDINE IN NACL 20-0.9 MG/50ML-% IV SOLN
INTRAVENOUS | Status: AC
  Filled 2020-07-05: qty 50

## 2020-07-05 MED ORDER — SODIUM CHLORIDE 0.9 % IV SOLN
140.0000 mg/m2 | Freq: Once | INTRAVENOUS | Status: AC
  Administered 2020-07-05: 216 mg via INTRAVENOUS
  Filled 2020-07-05: qty 36

## 2020-07-05 MED ORDER — SODIUM CHLORIDE 0.9 % IV SOLN
10.0000 mg | Freq: Once | INTRAVENOUS | Status: AC
  Administered 2020-07-05: 10 mg via INTRAVENOUS
  Filled 2020-07-05: qty 10

## 2020-07-05 MED ORDER — ACETAMINOPHEN 325 MG PO TABS
ORAL_TABLET | ORAL | Status: AC
  Filled 2020-07-05: qty 2

## 2020-07-05 MED ORDER — HEPARIN SOD (PORK) LOCK FLUSH 100 UNIT/ML IV SOLN
500.0000 [IU] | Freq: Once | INTRAVENOUS | Status: AC | PRN
  Administered 2020-07-05: 500 [IU]
  Filled 2020-07-05: qty 5

## 2020-07-05 MED ORDER — PALONOSETRON HCL INJECTION 0.25 MG/5ML
INTRAVENOUS | Status: AC
  Filled 2020-07-05: qty 5

## 2020-07-05 MED ORDER — PALONOSETRON HCL INJECTION 0.25 MG/5ML
0.2500 mg | Freq: Once | INTRAVENOUS | Status: AC
  Administered 2020-07-05: 0.25 mg via INTRAVENOUS

## 2020-07-05 MED ORDER — DIPHENHYDRAMINE HCL 50 MG/ML IJ SOLN
12.5000 mg | Freq: Once | INTRAMUSCULAR | Status: AC
  Administered 2020-07-05: 12.5 mg via INTRAVENOUS

## 2020-07-05 MED ORDER — ACETAMINOPHEN 325 MG PO TABS
650.0000 mg | ORAL_TABLET | Freq: Once | ORAL | Status: AC
  Administered 2020-07-05: 650 mg via ORAL

## 2020-07-05 MED ORDER — SODIUM CHLORIDE 0.9% FLUSH
10.0000 mL | INTRAVENOUS | Status: DC | PRN
  Administered 2020-07-05: 10 mL
  Filled 2020-07-05: qty 10

## 2020-07-05 MED ORDER — SODIUM CHLORIDE 0.9 % IV SOLN
370.0000 mg | Freq: Once | INTRAVENOUS | Status: AC
  Administered 2020-07-05: 370 mg via INTRAVENOUS
  Filled 2020-07-05: qty 37

## 2020-07-05 MED ORDER — DIPHENHYDRAMINE HCL 50 MG/ML IJ SOLN
INTRAMUSCULAR | Status: AC
  Filled 2020-07-05: qty 1

## 2020-07-05 MED ORDER — SODIUM CHLORIDE 0.9 % IV SOLN
Freq: Once | INTRAVENOUS | Status: AC
  Filled 2020-07-05: qty 250

## 2020-07-05 MED ORDER — FAMOTIDINE IN NACL 20-0.9 MG/50ML-% IV SOLN
20.0000 mg | Freq: Once | INTRAVENOUS | Status: AC
  Administered 2020-07-05: 20 mg via INTRAVENOUS

## 2020-07-05 NOTE — Patient Instructions (Signed)
[facility] Discharge Instructions for Patients Receiving Chemotherapy  Today you received the following chemotherapy agents: paclitaxel and carboplatin.  To help prevent nausea and vomiting after your treatment, we encourage you to take your nausea medication as directed.  If you develop nausea and vomiting that is not controlled by your nausea medication, call the clinic.   BELOW ARE SYMPTOMS THAT SHOULD BE REPORTED IMMEDIATELY:  *FEVER GREATER THAN 100.5 F  *CHILLS WITH OR WITHOUT FEVER  NAUSEA AND VOMITING THAT IS NOT CONTROLLED WITH YOUR NAUSEA MEDICATION  *UNUSUAL SHORTNESS OF BREATH  *UNUSUAL BRUISING OR BLEEDING  TENDERNESS IN MOUTH AND THROAT WITH OR WITHOUT PRESENCE OF ULCERS  *URINARY PROBLEMS  *BOWEL PROBLEMS  UNUSUAL RASH Items with * indicate a potential emergency and should be followed up as soon as possible.  Feel free to call the clinic should you have any questions or concerns. The clinic phone number is [phone].  Please show the CHEMO ALERT CARD at check-in to the Emergency Department and triage nurse.  Paclitaxel injection What is this medicine? PACLITAXEL (PAK li TAX el) is a chemotherapy drug. It targets fast dividing cells, like cancer cells, and causes these cells to die. This medicine is used to treat ovarian cancer, breast cancer, lung cancer, Kaposi's sarcoma, and other cancers. This medicine may be used for other purposes; ask your health care provider or pharmacist if you have questions. COMMON BRAND NAME(S): Onxol, Taxol What should I tell my health care provider before I take this medicine? They need to know if you have any of these conditions:  history of irregular heartbeat  liver disease  low blood counts, like low white cell, platelet, or red cell counts  lung or breathing disease, like asthma  tingling of the fingers or toes, or other nerve disorder  an unusual or allergic reaction to paclitaxel, alcohol,  polyoxyethylated castor oil, other chemotherapy, other medicines, foods, dyes, or preservatives  pregnant or trying to get pregnant  breast-feeding How should I use this medicine? This drug is given as an infusion into a vein. It is administered in a hospital or clinic by a specially trained health care professional. Talk to your pediatrician regarding the use of this medicine in children. Special care may be needed. Overdosage: If you think you have taken too much of this medicine contact a poison control center or emergency room at once. NOTE: This medicine is only for you. Do not share this medicine with others. What if I miss a dose? It is important not to miss your dose. Call your doctor or health care professional if you are unable to keep an appointment. What may interact with this medicine? Do not take this medicine with any of the following medications:  disulfiram  metronidazole This medicine may also interact with the following medications:  antiviral medicines for hepatitis, HIV or AIDS  certain antibiotics like erythromycin and clarithromycin  certain medicines for fungal infections like ketoconazole and itraconazole  certain medicines for seizures like carbamazepine, phenobarbital, phenytoin  gemfibrozil  nefazodone  rifampin  St. John's wort This list may not describe all possible interactions. Give your health care provider a list of all the medicines, herbs, non-prescription drugs, or dietary supplements you use. Also tell them if you smoke, drink alcohol, or use illegal drugs. Some items may interact with your medicine. What should I watch for while using this medicine? Your condition will be monitored carefully while you are receiving this medicine. You will need important blood   work done while you are taking this medicine. This medicine can cause serious allergic reactions. To reduce your risk you will need to take other medicine(s) before treatment with this  medicine. If you experience allergic reactions like skin rash, itching or hives, swelling of the face, lips, or tongue, tell your doctor or health care professional right away. In some cases, you may be given additional medicines to help with side effects. Follow all directions for their use. This drug may make you feel generally unwell. This is not uncommon, as chemotherapy can affect healthy cells as well as cancer cells. Report any side effects. Continue your course of treatment even though you feel ill unless your doctor tells you to stop. Call your doctor or health care professional for advice if you get a fever, chills or sore throat, or other symptoms of a cold or flu. Do not treat yourself. This drug decreases your body's ability to fight infections. Try to avoid being around people who are sick. This medicine may increase your risk to bruise or bleed. Call your doctor or health care professional if you notice any unusual bleeding. Be careful brushing and flossing your teeth or using a toothpick because you may get an infection or bleed more easily. If you have any dental work done, tell your dentist you are receiving this medicine. Avoid taking products that contain aspirin, acetaminophen, ibuprofen, naproxen, or ketoprofen unless instructed by your doctor. These medicines may hide a fever. Do not become pregnant while taking this medicine. Women should inform their doctor if they wish to become pregnant or think they might be pregnant. There is a potential for serious side effects to an unborn child. Talk to your health care professional or pharmacist for more information. Do not breast-feed an infant while taking this medicine. Men are advised not to father a child while receiving this medicine. This product may contain alcohol. Ask your pharmacist or healthcare provider if this medicine contains alcohol. Be sure to tell all healthcare providers you are taking this medicine. Certain medicines,  like metronidazole and disulfiram, can cause an unpleasant reaction when taken with alcohol. The reaction includes flushing, headache, nausea, vomiting, sweating, and increased thirst. The reaction can last from 30 minutes to several hours. What side effects may I notice from receiving this medicine? Side effects that you should report to your doctor or health care professional as soon as possible:  allergic reactions like skin rash, itching or hives, swelling of the face, lips, or tongue  breathing problems  changes in vision  fast, irregular heartbeat  high or low blood pressure  mouth sores  pain, tingling, numbness in the hands or feet  signs of decreased platelets or bleeding - bruising, pinpoint red spots on the skin, black, tarry stools, blood in the urine  signs of decreased red blood cells - unusually weak or tired, feeling faint or lightheaded, falls  signs of infection - fever or chills, cough, sore throat, pain or difficulty passing urine  signs and symptoms of liver injury like dark yellow or brown urine; general ill feeling or flu-like symptoms; light-colored stools; loss of appetite; nausea; right upper belly pain; unusually weak or tired; yellowing of the eyes or skin  swelling of the ankles, feet, hands  unusually slow heartbeat Side effects that usually do not require medical attention (report to your doctor or health care professional if they continue or are bothersome):  diarrhea  hair loss  loss of appetite  muscle or joint pain    nausea, vomiting  pain, redness, or irritation at site where injected  tiredness This list may not describe all possible side effects. Call your doctor for medical advice about side effects. You may report side effects to FDA at 1-800-FDA-1088. Where should I keep my medicine? This drug is given in a hospital or clinic and will not be stored at home. NOTE: This sheet is a summary. It may not cover all possible information.  If you have questions about this medicine, talk to your doctor, pharmacist, or health care provider.  2020 Elsevier/Gold Standard (2017-05-21 13:14:55)  Carboplatin injection What is this medicine? CARBOPLATIN (KAR boe pla tin) is a chemotherapy drug. It targets fast dividing cells, like cancer cells, and causes these cells to die. This medicine is used to treat ovarian cancer and many other cancers. This medicine may be used for other purposes; ask your health care provider or pharmacist if you have questions. COMMON BRAND NAME(S): Paraplatin What should I tell my health care provider before I take this medicine? They need to know if you have any of these conditions:  blood disorders  hearing problems  kidney disease  recent or ongoing radiation therapy  an unusual or allergic reaction to carboplatin, cisplatin, other chemotherapy, other medicines, foods, dyes, or preservatives  pregnant or trying to get pregnant  breast-feeding How should I use this medicine? This drug is usually given as an infusion into a vein. It is administered in a hospital or clinic by a specially trained health care professional. Talk to your pediatrician regarding the use of this medicine in children. Special care may be needed. Overdosage: If you think you have taken too much of this medicine contact a poison control center or emergency room at once. NOTE: This medicine is only for you. Do not share this medicine with others. What if I miss a dose? It is important not to miss a dose. Call your doctor or health care professional if you are unable to keep an appointment. What may interact with this medicine?  medicines for seizures  medicines to increase blood counts like filgrastim, pegfilgrastim, sargramostim  some antibiotics like amikacin, gentamicin, neomycin, streptomycin, tobramycin  vaccines Talk to your doctor or health care professional before taking any of these  medicines:  acetaminophen  aspirin  ibuprofen  ketoprofen  naproxen This list may not describe all possible interactions. Give your health care provider a list of all the medicines, herbs, non-prescription drugs, or dietary supplements you use. Also tell them if you smoke, drink alcohol, or use illegal drugs. Some items may interact with your medicine. What should I watch for while using this medicine? Your condition will be monitored carefully while you are receiving this medicine. You will need important blood work done while you are taking this medicine. This drug may make you feel generally unwell. This is not uncommon, as chemotherapy can affect healthy cells as well as cancer cells. Report any side effects. Continue your course of treatment even though you feel ill unless your doctor tells you to stop. In some cases, you may be given additional medicines to help with side effects. Follow all directions for their use. Call your doctor or health care professional for advice if you get a fever, chills or sore throat, or other symptoms of a cold or flu. Do not treat yourself. This drug decreases your body's ability to fight infections. Try to avoid being around people who are sick. This medicine may increase your risk to bruise or bleed.   Call your doctor or health care professional if you notice any unusual bleeding. Be careful brushing and flossing your teeth or using a toothpick because you may get an infection or bleed more easily. If you have any dental work done, tell your dentist you are receiving this medicine. Avoid taking products that contain aspirin, acetaminophen, ibuprofen, naproxen, or ketoprofen unless instructed by your doctor. These medicines may hide a fever. Do not become pregnant while taking this medicine. Women should inform their doctor if they wish to become pregnant or think they might be pregnant. There is a potential for serious side effects to an unborn child. Talk  to your health care professional or pharmacist for more information. Do not breast-feed an infant while taking this medicine. What side effects may I notice from receiving this medicine? Side effects that you should report to your doctor or health care professional as soon as possible:  allergic reactions like skin rash, itching or hives, swelling of the face, lips, or tongue  signs of infection - fever or chills, cough, sore throat, pain or difficulty passing urine  signs of decreased platelets or bleeding - bruising, pinpoint red spots on the skin, black, tarry stools, nosebleeds  signs of decreased red blood cells - unusually weak or tired, fainting spells, lightheadedness  breathing problems  changes in hearing  changes in vision  chest pain  high blood pressure  low blood counts - This drug may decrease the number of white blood cells, red blood cells and platelets. You may be at increased risk for infections and bleeding.  nausea and vomiting  pain, swelling, redness or irritation at the injection site  pain, tingling, numbness in the hands or feet  problems with balance, talking, walking  trouble passing urine or change in the amount of urine Side effects that usually do not require medical attention (report to your doctor or health care professional if they continue or are bothersome):  hair loss  loss of appetite  metallic taste in the mouth or changes in taste This list may not describe all possible side effects. Call your doctor for medical advice about side effects. You may report side effects to FDA at 1-800-FDA-1088. Where should I keep my medicine? This drug is given in a hospital or clinic and will not be stored at home. NOTE: This sheet is a summary. It may not cover all possible information. If you have questions about this medicine, talk to your doctor, pharmacist, or health care provider.  2020 Elsevier/Gold Standard (2007-12-23 14:38:05)     

## 2020-07-25 ENCOUNTER — Inpatient Hospital Stay: Payer: Medicare Other

## 2020-07-25 ENCOUNTER — Encounter: Payer: Self-pay | Admitting: Hematology and Oncology

## 2020-07-25 ENCOUNTER — Other Ambulatory Visit: Payer: Self-pay

## 2020-07-25 ENCOUNTER — Inpatient Hospital Stay (HOSPITAL_BASED_OUTPATIENT_CLINIC_OR_DEPARTMENT_OTHER): Payer: Medicare Other | Admitting: Hematology and Oncology

## 2020-07-25 ENCOUNTER — Other Ambulatory Visit: Payer: Self-pay | Admitting: Hematology and Oncology

## 2020-07-25 VITALS — BP 125/78 | HR 78 | Temp 97.0°F | Resp 18 | Ht 60.0 in | Wt 112.0 lb

## 2020-07-25 DIAGNOSIS — C569 Malignant neoplasm of unspecified ovary: Secondary | ICD-10-CM

## 2020-07-25 DIAGNOSIS — D539 Nutritional anemia, unspecified: Secondary | ICD-10-CM | POA: Diagnosis not present

## 2020-07-25 DIAGNOSIS — D61818 Other pancytopenia: Secondary | ICD-10-CM

## 2020-07-25 DIAGNOSIS — R110 Nausea: Secondary | ICD-10-CM

## 2020-07-25 DIAGNOSIS — R531 Weakness: Secondary | ICD-10-CM

## 2020-07-25 DIAGNOSIS — I2692 Saddle embolus of pulmonary artery without acute cor pulmonale: Secondary | ICD-10-CM

## 2020-07-25 DIAGNOSIS — Z5111 Encounter for antineoplastic chemotherapy: Secondary | ICD-10-CM | POA: Diagnosis not present

## 2020-07-25 DIAGNOSIS — N183 Chronic kidney disease, stage 3 unspecified: Secondary | ICD-10-CM

## 2020-07-25 DIAGNOSIS — J90 Pleural effusion, not elsewhere classified: Secondary | ICD-10-CM

## 2020-07-25 DIAGNOSIS — K5909 Other constipation: Secondary | ICD-10-CM

## 2020-07-25 LAB — CBC WITH DIFFERENTIAL (CANCER CENTER ONLY)
Abs Immature Granulocytes: 0.02 10*3/uL (ref 0.00–0.07)
Basophils Absolute: 0 10*3/uL (ref 0.0–0.1)
Basophils Relative: 0 %
Eosinophils Absolute: 0.1 10*3/uL (ref 0.0–0.5)
Eosinophils Relative: 3 %
HCT: 27.9 % — ABNORMAL LOW (ref 36.0–46.0)
Hemoglobin: 9.2 g/dL — ABNORMAL LOW (ref 12.0–15.0)
Immature Granulocytes: 1 %
Lymphocytes Relative: 25 %
Lymphs Abs: 0.8 10*3/uL (ref 0.7–4.0)
MCH: 27.8 pg (ref 26.0–34.0)
MCHC: 33 g/dL (ref 30.0–36.0)
MCV: 84.3 fL (ref 80.0–100.0)
Monocytes Absolute: 0.5 10*3/uL (ref 0.1–1.0)
Monocytes Relative: 16 %
Neutro Abs: 1.8 10*3/uL (ref 1.7–7.7)
Neutrophils Relative %: 55 %
Platelet Count: 68 10*3/uL — ABNORMAL LOW (ref 150–400)
RBC: 3.31 MIL/uL — ABNORMAL LOW (ref 3.87–5.11)
RDW: 19.7 % — ABNORMAL HIGH (ref 11.5–15.5)
WBC Count: 3.2 10*3/uL — ABNORMAL LOW (ref 4.0–10.5)
nRBC: 0 % (ref 0.0–0.2)

## 2020-07-25 LAB — CMP (CANCER CENTER ONLY)
ALT: 18 U/L (ref 0–44)
AST: 27 U/L (ref 15–41)
Albumin: 3.8 g/dL (ref 3.5–5.0)
Alkaline Phosphatase: 122 U/L (ref 38–126)
Anion gap: 12 (ref 5–15)
BUN: 18 mg/dL (ref 8–23)
CO2: 26 mmol/L (ref 22–32)
Calcium: 9.7 mg/dL (ref 8.9–10.3)
Chloride: 103 mmol/L (ref 98–111)
Creatinine: 1 mg/dL (ref 0.44–1.00)
GFR, Estimated: 57 mL/min — ABNORMAL LOW (ref >60)
Glucose, Bld: 130 mg/dL — ABNORMAL HIGH (ref 70–99)
Potassium: 3.1 mmol/L — ABNORMAL LOW (ref 3.5–5.1)
Sodium: 141 mmol/L (ref 135–145)
Total Bilirubin: 0.5 mg/dL (ref 0.3–1.2)
Total Protein: 8.7 g/dL — ABNORMAL HIGH (ref 6.5–8.1)

## 2020-07-25 MED ORDER — SODIUM CHLORIDE 0.9% FLUSH
10.0000 mL | Freq: Once | INTRAVENOUS | Status: AC
  Administered 2020-07-25: 10 mL
  Filled 2020-07-25: qty 10

## 2020-07-25 NOTE — Assessment & Plan Note (Signed)
She has excellent breath sounds I suspect her pleural effusion has resolved As above, I plan to order CT imaging next month for assessment

## 2020-07-25 NOTE — Assessment & Plan Note (Signed)
She tolerated anticoagulation therapy well without major bleeding complications She need at least 3 months of anticoagulation treatment before we can stop her anticoagulation therapy for interval debulking surgery There is no contraindication to remain on antiplatelet agents or anticoagulants as long as the platelet is greater than 50,000.

## 2020-07-25 NOTE — Assessment & Plan Note (Signed)
The patient appears somewhat reluctant to take laxative We discussed the importance of regular laxative therapy. She is made aware that constipation can contribute to nausea as well

## 2020-07-25 NOTE — Assessment & Plan Note (Signed)
She complained of generalized weakness and requested a prescription for bedside commode I gave her a prescription and information to call to order a bedside commode

## 2020-07-25 NOTE — Assessment & Plan Note (Addendum)
She has severe pancytopenia likely due to side effects of treatment She is not symptomatic except for bruising As above, I plan to reduce the dose of chemotherapy and delay treatment I plan to order serum iron and B12 in her next visit to rule out nutritional causes that could contribute to pancytopenia

## 2020-07-25 NOTE — Assessment & Plan Note (Signed)
Her renal function is back to normal We will adjust the dose of chemotherapy accordingly

## 2020-07-25 NOTE — Progress Notes (Signed)
[facility] OFFICE PROGRESS NOTE  Patient Care Team: [name], MD as PCP - General (Internal Medicine)  ASSESSMENT & PLAN:  Ovarian CA, unspecified laterality ([place]) Overall, she tolerated treatment very poorly with significant pancytopenia, progressive weight loss and nausea Her recent tumor marker is better but far from normal With significant pancytopenia today and weight loss, I plan to adjust the dose of her treatment and delay treatment until next week I plan to reduce carboplatin to AUC of 5 and adjust her treatment weight based on today's weight We have extensive discussions about the importance of frequent small meals and to keep her weight about the same if possible I will order CT imaging next month for objective assessment of response to therapy I will delay her next cycle of treatment by 1 week to allow bone marrow recovery If CT imaging showed excellent response to therapy, I will refer her back to GYN surgeon to consider interval debulking surgery  Pancytopenia, acquired ([place]) She has severe pancytopenia likely due to side effects of treatment She is not symptomatic except for bruising As above, I plan to reduce the dose of chemotherapy and delay treatment I plan to order serum iron and B12 in her next visit to rule out nutritional causes that could contribute to pancytopenia  Acute pulmonary embolism ([place]) She tolerated anticoagulation therapy well without major bleeding complications She need at least 3 months of anticoagulation treatment before we can stop her anticoagulation therapy for interval debulking surgery There is no contraindication to remain on antiplatelet agents or anticoagulants as long as the platelet is greater than 50,000.    CKD (chronic kidney disease), stage III Her renal function is back to normal We will adjust the dose of chemotherapy accordingly  Pleural effusion She has excellent breath sounds I suspect her pleural  effusion has resolved As above, I plan to order CT imaging next month for assessment  Other constipation The patient appears somewhat reluctant to take laxative We discussed the importance of regular laxative therapy. She is made aware that constipation can contribute to nausea as well  Chronic nausea She has chronic nausea I recommend she takes her antiemetics as prescribed  Generalized weakness She complained of generalized weakness and requested a prescription for bedside commode I gave her a prescription and information to call to order a bedside commode   Orders Placed This Encounter  Procedures  . CT CHEST ABDOMEN PELVIS W CONTRAST    Standing Status:   Future    Standing Expiration Date:   07/25/2021    Order Specific Question:   Preferred imaging location?    Answer:   [facility]    Order Specific Question:   Radiology Contrast Protocol - do NOT remove file path    Answer:   [email or web address]  . Iron and TIBC    Standing Status:   Future    Standing Expiration Date:   07/25/2021  . Vitamin B12    Standing Status:   Future    Standing Expiration Date:   07/25/2021  . Ferritin    Standing Status:   Future    Standing Expiration Date:   07/25/2021    All questions were answered. The patient knows to call the clinic with any problems, questions or concerns. The total time spent in the appointment was 40 minutes encounter with patients including review of chart and various tests results, discussions about plan of care and coordination of care plan   [name], MD  07/25/2020 1:01 PM  INTERVAL HISTORY: Please see below for problem oriented charting. She returns with her daughter for further follow-up She has lost a lot of weight since last time I saw her She had nausea with occasional vomiting within the first week of treatment and that makes her lose appetite She also have intermittent constipation She does not take  MiraLAX daily and she does not have daily bowel movement Denies peripheral neuropathy No recent fever or chills She has overall weakness and requests a bedside commode.  No recent falls She noted easy bruising The patient denies any recent signs or symptoms of bleeding such as spontaneous epistaxis, hematuria or hematochezia.   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  CT imaging also revealed abdominal mass.  Subsequent biopsy confirmed diagnosis of ovarian cancer   05/21/2020 Procedure   Successful ultrasound guided diagnostic and therapeutic left thoracentesis yielding 580 cc of pleural fluid.   05/21/2020 Imaging   1. Large saddle pulmonary embolus with CT evidence of right heart strain (RV/LV ratio of 1.6) consistent with at least submassive (intermediate risk) PE. The presence of right heart strain has been associated with an increased risk of morbidity and mortality. 2. Large left and small right pleural effusions. 3. Large amount of peritoneal nodularity, consistent with peritoneal carcinomatosis. 4. Large midline pelvic mass, measuring up to 12.9 x 8.7 x 9.7 cm. 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   05/21/2020 Pathology Results   Clinical History: None provided  Specimen Submitted:  A. PLEURAL FLUID,LEFT, THORACENTESIS:    FINAL MICROSCOPIC DIAGNOSIS:  - Reactive mesothelial cells present   05/24/2020 Procedure   1. Successful placement of a right internal jugular approach power injectable Port-A-Cath. The catheter is ready for immediate use. 2. Successful ultrasound-guided paracentesis yielding 2.2 L of serous, slightly blood tinged ascitic  fluid.   05/24/2020 Pathology Results   A. OMENTUM, NEEDLE CORE BIOPSY:  -  High-grade carcinoma  -  See comment   COMMENT:   By immunohistochemistry, the neoplastic cells are positive for cytokeratin 7 and PAX 8 but negative for cytokeratin 20, CDX2 and TTF-1. 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    05/30/2020 Cancer Staging   Staging form: Ovary, Fallopian Tube, and Primary Peritoneal Carcinoma, AJCC 8th Edition - Clinical stage from 05/30/2020: FIGO Stage IIIC (cT3, cN0, cM0) - Signed by [name], MD on 05/30/2020   06/10/2020 Tumor Marker   Patient's tumor was tested for the following markers: CA-125. Results of the tumor marker test revealed [id number].   06/14/2020 -  Chemotherapy   The patient had carboplatin and taxol for chemotherapy treatment.     07/04/2020 Tumor Marker   Patient's tumor was tested for the following markers: CA-125 Results of the tumor marker test revealed [id number]     REVIEW OF SYSTEMS:   Constitutional: Denies fevers, chills  Eyes: Denies blurriness of vision Ears, nose, mouth, throat, and face: Denies mucositis or sore throat Respiratory: Denies cough, dyspnea or wheezes Cardiovascular: Denies palpitation, chest discomfort or lower extremity swelling Skin: Denies abnormal skin rashes Lymphatics: Denies new lymphadenopathy  Behavioral/Psych: Mood is stable, no new  changes  All other systems were reviewed with the patient and are negative.  I have reviewed the past medical history, past surgical history, social history and family history with the patient and they are unchanged from previous note.  ALLERGIES:  is allergic to metformin and related.  MEDICATIONS:  Current Outpatient Medications  Medication Sig Dispense Refill  . acetaminophen (TYLENOL) 325 MG tablet Take 650 mg by mouth every 6 (six) hours as needed for mild pain.    Marland Kitchen albuterol (PROVENTIL HFA;VENTOLIN HFA)  108 (90 BASE) MCG/ACT inhaler Inhale 2 puffs into the lungs every 6 (six) hours as needed. For wheezing.    Marland Kitchen apixaban (ELIQUIS) 5 MG TABS tablet Take 1 tablet (5 mg total) by mouth 2 (two) times daily. 60 tablet 0  . cetirizine (ZYRTEC) 10 MG tablet Take 10 mg by mouth daily.    Marland Kitchen dexamethasone (DECADRON) 4 MG tablet Take 2 tabs at the night before chemotherapy, every 3 weeks, by mouth x 6 cycles 12 tablet 0  . empagliflozin (JARDIANCE) 10 MG TABS tablet Take 10 mg by mouth daily.    . furosemide (LASIX) 40 MG tablet Take 40 mg by mouth daily.   11  . glimepiride (AMARYL) 1 MG tablet Take 0.5 mg by mouth daily.    Marland Kitchen levothyroxine (SYNTHROID, LEVOTHROID) 75 MCG tablet Take 75 mcg by mouth daily.    Marland Kitchen lidocaine-prilocaine (EMLA) cream Apply 1 application topically daily as needed. 30 g 11  . omeprazole (PRILOSEC) 20 MG capsule Take 20 mg by mouth daily.    . ondansetron (ZOFRAN) 4 MG tablet Take 4 mg by mouth. Take one tablet by mouth thirty minutes before each meal.    . ondansetron (ZOFRAN) 8 MG tablet Take 1 tablet (8 mg total) by mouth every 8 (eight) hours as needed for nausea. 30 tablet 3  . polyethylene glycol (MIRALAX / GLYCOLAX) 17 g packet Take 17 g by mouth. BID MTWTF    . prochlorperazine (COMPAZINE) 10 MG tablet Take 1 tablet (10 mg total) by mouth every 6 (six) hours as needed for nausea or vomiting. 30 tablet 1  . sertraline (ZOLOFT) 50 MG tablet Take 50 mg by mouth daily.  0  . simvastatin (ZOCOR) 40 MG tablet Take 40 mg by mouth daily.  3  . TRADJENTA 5 MG TABS tablet Take 5 mg by mouth daily.  3   No current facility-administered medications for this visit.    PHYSICAL EXAMINATION: ECOG PERFORMANCE STATUS: 2 - Symptomatic, <50% confined to bed  Vitals:   07/25/20 1200  BP: 125/78  Pulse: 78  Resp: 18  Temp: (!) 97 F (36.1 C)  SpO2: 100%   Filed Weights   07/25/20 1200  Weight: 112 lb (50.8 kg)    GENERAL:alert, no distress and comfortable.  She looks thin and  cachectic SKIN: skin color, texture, turgor are normal, no rashes or significant lesions EYES: normal, Conjunctiva are pink and non-injected, sclera clear OROPHARYNX:no exudate, no erythema and lips, buccal mucosa, and tongue normal  NECK: supple, thyroid normal size, non-tender, without nodularity LYMPH:  no palpable lymphadenopathy in the cervical, axillary or inguinal LUNGS: clear to auscultation and percussion with normal breathing effort HEART: regular rate & rhythm and no murmurs and no lower extremity edema ABDOMEN:abdomen soft, mild tenderness in the periumbilical region.  No rebound or guarding Musculoskeletal:no cyanosis of digits and no clubbing  NEURO: alert & oriented x 3 with fluent speech, no focal motor/sensory deficits  LABORATORY DATA:  I have reviewed the data as listed    Component Value Date/Time   NA 141 07/25/2020 1154   K 3.1 (L) 07/25/2020 1154   CL 103 07/25/2020 1154   CO2 26 07/25/2020 1154   GLUCOSE 130 (H) 07/25/2020 1154   BUN 18 07/25/2020 1154   CREATININE 1.00 07/25/2020 1154   CALCIUM 9.7 07/25/2020 1154   PROT 8.7 (H) 07/25/2020 1154   ALBUMIN 3.8 07/25/2020 1154   AST 27 07/25/2020 1154   ALT 18 07/25/2020 1154   ALKPHOS 122 07/25/2020 1154   BILITOT 0.5 07/25/2020 1154   GFRNONAA 57 (L) 07/25/2020 1154   GFRAA 59 (L) 07/04/2020 1217    No results found for: SPEP, UPEP  Lab Results  Component Value Date   WBC 3.2 (L) 07/25/2020   NEUTROABS 1.8 07/25/2020   HGB 9.2 (L) 07/25/2020   HCT 27.9 (L) 07/25/2020   MCV 84.3 07/25/2020   PLT 68 (L) 07/25/2020      Chemistry      Component Value Date/Time   NA 141 07/25/2020 1154   K 3.1 (L) 07/25/2020 1154   CL 103 07/25/2020 1154   CO2 26 07/25/2020 1154   BUN 18 07/25/2020 1154   CREATININE 1.00 07/25/2020 1154      Component Value Date/Time   CALCIUM 9.7 07/25/2020 1154   ALKPHOS 122 07/25/2020 1154   AST 27 07/25/2020 1154   ALT 18 07/25/2020 1154   BILITOT 0.5 07/25/2020  1154

## 2020-07-25 NOTE — Assessment & Plan Note (Signed)
She has chronic nausea I recommend she takes her antiemetics as prescribed

## 2020-07-25 NOTE — Assessment & Plan Note (Signed)
Overall, she tolerated treatment very poorly with significant pancytopenia, progressive weight loss and nausea Her recent tumor marker is better but far from normal With significant pancytopenia today and weight loss, I plan to adjust the dose of her treatment and delay treatment until next week I plan to reduce carboplatin to AUC of 5 and adjust her treatment weight based on today's weight We have extensive discussions about the importance of frequent small meals and to keep her weight about the same if possible I will order CT imaging next month for objective assessment of response to therapy I will delay her next cycle of treatment by 1 week to allow bone marrow recovery If CT imaging showed excellent response to therapy, I will refer her back to GYN surgeon to consider interval debulking surgery

## 2020-07-26 ENCOUNTER — Ambulatory Visit: Payer: Medicare Other

## 2020-07-27 ENCOUNTER — Telehealth: Payer: Self-pay | Admitting: Hematology and Oncology

## 2020-07-27 NOTE — Telephone Encounter (Signed)
Scheduled appointments per 10/25 sch msg. Called patient, no answer. No voicemail set up. Will mail patient updated calendar with appointments.

## 2020-08-03 ENCOUNTER — Other Ambulatory Visit: Payer: Self-pay

## 2020-08-03 ENCOUNTER — Other Ambulatory Visit: Payer: Self-pay | Admitting: Hematology and Oncology

## 2020-08-03 ENCOUNTER — Inpatient Hospital Stay: Payer: Medicare Other

## 2020-08-03 ENCOUNTER — Inpatient Hospital Stay: Payer: Medicare Other | Attending: Hematology and Oncology

## 2020-08-03 VITALS — BP 134/67 | HR 59 | Temp 98.2°F | Resp 18 | Ht 60.0 in | Wt 109.5 lb

## 2020-08-03 DIAGNOSIS — D539 Nutritional anemia, unspecified: Secondary | ICD-10-CM

## 2020-08-03 DIAGNOSIS — Z5111 Encounter for antineoplastic chemotherapy: Secondary | ICD-10-CM | POA: Diagnosis present

## 2020-08-03 DIAGNOSIS — K5909 Other constipation: Secondary | ICD-10-CM | POA: Insufficient documentation

## 2020-08-03 DIAGNOSIS — C569 Malignant neoplasm of unspecified ovary: Secondary | ICD-10-CM | POA: Diagnosis present

## 2020-08-03 DIAGNOSIS — Z23 Encounter for immunization: Secondary | ICD-10-CM

## 2020-08-03 DIAGNOSIS — N183 Chronic kidney disease, stage 3 unspecified: Secondary | ICD-10-CM | POA: Insufficient documentation

## 2020-08-03 DIAGNOSIS — D61818 Other pancytopenia: Secondary | ICD-10-CM | POA: Diagnosis not present

## 2020-08-03 DIAGNOSIS — I2699 Other pulmonary embolism without acute cor pulmonale: Secondary | ICD-10-CM | POA: Diagnosis not present

## 2020-08-03 DIAGNOSIS — Z7901 Long term (current) use of anticoagulants: Secondary | ICD-10-CM | POA: Diagnosis not present

## 2020-08-03 DIAGNOSIS — R634 Abnormal weight loss: Secondary | ICD-10-CM | POA: Insufficient documentation

## 2020-08-03 LAB — CBC WITH DIFFERENTIAL (CANCER CENTER ONLY)
Abs Immature Granulocytes: 0.01 10*3/uL (ref 0.00–0.07)
Basophils Absolute: 0 10*3/uL (ref 0.0–0.1)
Basophils Relative: 1 %
Eosinophils Absolute: 0.2 10*3/uL (ref 0.0–0.5)
Eosinophils Relative: 4 %
HCT: 28.2 % — ABNORMAL LOW (ref 36.0–46.0)
Hemoglobin: 9.1 g/dL — ABNORMAL LOW (ref 12.0–15.0)
Immature Granulocytes: 0 %
Lymphocytes Relative: 23 %
Lymphs Abs: 1.2 10*3/uL (ref 0.7–4.0)
MCH: 27.8 pg (ref 26.0–34.0)
MCHC: 32.3 g/dL (ref 30.0–36.0)
MCV: 86.2 fL (ref 80.0–100.0)
Monocytes Absolute: 0.7 10*3/uL (ref 0.1–1.0)
Monocytes Relative: 14 %
Neutro Abs: 2.9 10*3/uL (ref 1.7–7.7)
Neutrophils Relative %: 58 %
Platelet Count: 440 10*3/uL — ABNORMAL HIGH (ref 150–400)
RBC: 3.27 MIL/uL — ABNORMAL LOW (ref 3.87–5.11)
RDW: 22 % — ABNORMAL HIGH (ref 11.5–15.5)
WBC Count: 5.1 10*3/uL (ref 4.0–10.5)
nRBC: 0 % (ref 0.0–0.2)

## 2020-08-03 LAB — IRON AND TIBC
Iron: 55 ug/dL (ref 41–142)
Saturation Ratios: 17 % — ABNORMAL LOW (ref 21–57)
TIBC: 323 ug/dL (ref 236–444)
UIBC: 268 ug/dL (ref 120–384)

## 2020-08-03 LAB — CMP (CANCER CENTER ONLY)
ALT: 19 U/L (ref 0–44)
AST: 24 U/L (ref 15–41)
Albumin: 3.8 g/dL (ref 3.5–5.0)
Alkaline Phosphatase: 104 U/L (ref 38–126)
Anion gap: 16 — ABNORMAL HIGH (ref 5–15)
BUN: 28 mg/dL — ABNORMAL HIGH (ref 8–23)
CO2: 26 mmol/L (ref 22–32)
Calcium: 9.5 mg/dL (ref 8.9–10.3)
Chloride: 100 mmol/L (ref 98–111)
Creatinine: 1.14 mg/dL — ABNORMAL HIGH (ref 0.44–1.00)
GFR, Estimated: 49 mL/min — ABNORMAL LOW (ref >60)
Glucose, Bld: 181 mg/dL — ABNORMAL HIGH (ref 70–99)
Potassium: 3.1 mmol/L — ABNORMAL LOW (ref 3.5–5.1)
Sodium: 142 mmol/L (ref 135–145)
Total Bilirubin: 0.5 mg/dL (ref 0.3–1.2)
Total Protein: 8.8 g/dL — ABNORMAL HIGH (ref 6.5–8.1)

## 2020-08-03 LAB — FERRITIN: Ferritin: 812 ng/mL — ABNORMAL HIGH (ref 11–307)

## 2020-08-03 LAB — VITAMIN B12: Vitamin B-12: 284 pg/mL (ref 180–914)

## 2020-08-03 MED ORDER — SODIUM CHLORIDE 0.9 % IV SOLN
140.0000 mg/m2 | Freq: Once | INTRAVENOUS | Status: AC
  Administered 2020-08-03: 204 mg via INTRAVENOUS
  Filled 2020-08-03: qty 34

## 2020-08-03 MED ORDER — FAMOTIDINE IN NACL 20-0.9 MG/50ML-% IV SOLN
INTRAVENOUS | Status: AC
  Filled 2020-08-03: qty 50

## 2020-08-03 MED ORDER — DIPHENHYDRAMINE HCL 50 MG/ML IJ SOLN
INTRAMUSCULAR | Status: AC
  Filled 2020-08-03: qty 1

## 2020-08-03 MED ORDER — SODIUM CHLORIDE 0.9 % IV SOLN
150.0000 mg | Freq: Once | INTRAVENOUS | Status: AC
  Administered 2020-08-03: 150 mg via INTRAVENOUS
  Filled 2020-08-03: qty 150

## 2020-08-03 MED ORDER — SODIUM CHLORIDE 0.9 % IV SOLN: 283.0000 mg | Freq: Once | INTRAVENOUS | Status: DC

## 2020-08-03 MED ORDER — SODIUM CHLORIDE 0.9 % IV SOLN
10.0000 mg | Freq: Once | INTRAVENOUS | Status: AC
  Administered 2020-08-03: 10 mg via INTRAVENOUS
  Filled 2020-08-03: qty 10

## 2020-08-03 MED ORDER — SODIUM CHLORIDE 0.9 % IV SOLN
Freq: Once | INTRAVENOUS | Status: AC
  Filled 2020-08-03: qty 250

## 2020-08-03 MED ORDER — FAMOTIDINE IN NACL 20-0.9 MG/50ML-% IV SOLN
20.0000 mg | Freq: Once | INTRAVENOUS | Status: AC
  Administered 2020-08-03: 20 mg via INTRAVENOUS

## 2020-08-03 MED ORDER — PALONOSETRON HCL INJECTION 0.25 MG/5ML
0.2500 mg | Freq: Once | INTRAVENOUS | Status: AC
  Administered 2020-08-03: 0.25 mg via INTRAVENOUS

## 2020-08-03 MED ORDER — SODIUM CHLORIDE 0.9 % IV SOLN
283.0000 mg | Freq: Once | INTRAVENOUS | Status: DC
  Filled 2020-08-03: qty 28

## 2020-08-03 MED ORDER — SODIUM CHLORIDE 0.9% FLUSH
10.0000 mL | INTRAVENOUS | Status: DC | PRN
  Administered 2020-08-03: 10 mL
  Filled 2020-08-03: qty 10

## 2020-08-03 MED ORDER — PALONOSETRON HCL INJECTION 0.25 MG/5ML
INTRAVENOUS | Status: AC
  Filled 2020-08-03: qty 5

## 2020-08-03 MED ORDER — HEPARIN SOD (PORK) LOCK FLUSH 100 UNIT/ML IV SOLN
500.0000 [IU] | Freq: Once | INTRAVENOUS | Status: AC | PRN
  Administered 2020-08-03: 500 [IU]
  Filled 2020-08-03: qty 5

## 2020-08-03 MED ORDER — SODIUM CHLORIDE 0.9 % IV SOLN
230.0000 mg | Freq: Once | INTRAVENOUS | Status: AC
  Administered 2020-08-03: 230 mg via INTRAVENOUS
  Filled 2020-08-03: qty 23

## 2020-08-03 MED ORDER — DIPHENHYDRAMINE HCL 50 MG/ML IJ SOLN
12.5000 mg | Freq: Once | INTRAMUSCULAR | Status: AC
  Administered 2020-08-03: 12.5 mg via INTRAVENOUS

## 2020-08-03 NOTE — Progress Notes (Signed)
   Covid-19 Vaccination Clinic  Name:  Amber Gray    MRN: [id number] DOB: 22-Mar-1941  08/03/2020  [name] was observed post Covid-19 immunization for 15 minutes without incident. She was provided with Vaccine Information Sheet and instruction to access the V-Safe system.   [name] was instructed to call 911 with any severe reactions post vaccine: Marland Kitchen Difficulty breathing  . Swelling of face and throat  . A fast heartbeat  . A bad rash all over body  . Dizziness and weakness

## 2020-08-03 NOTE — Patient Instructions (Signed)
[facility] Discharge Instructions for Patients Receiving Chemotherapy  Today you received the following chemotherapy agents: paclitaxel and carboplatin.  To help prevent nausea and vomiting after your treatment, we encourage you to take your nausea medication as directed.  If you develop nausea and vomiting that is not controlled by your nausea medication, call the clinic.   BELOW ARE SYMPTOMS THAT SHOULD BE REPORTED IMMEDIATELY:  *FEVER GREATER THAN 100.5 F  *CHILLS WITH OR WITHOUT FEVER  NAUSEA AND VOMITING THAT IS NOT CONTROLLED WITH YOUR NAUSEA MEDICATION  *UNUSUAL SHORTNESS OF BREATH  *UNUSUAL BRUISING OR BLEEDING  TENDERNESS IN MOUTH AND THROAT WITH OR WITHOUT PRESENCE OF ULCERS  *URINARY PROBLEMS  *BOWEL PROBLEMS  UNUSUAL RASH Items with * indicate a potential emergency and should be followed up as soon as possible.  Feel free to call the clinic should you have any questions or concerns. The clinic phone number is [phone].  Please show the CHEMO ALERT CARD at check-in to the Emergency Department and triage nurse.  Paclitaxel injection What is this medicine? PACLITAXEL (PAK li TAX el) is a chemotherapy drug. It targets fast dividing cells, like cancer cells, and causes these cells to die. This medicine is used to treat ovarian cancer, breast cancer, lung cancer, Kaposi's sarcoma, and other cancers. This medicine may be used for other purposes; ask your health care provider or pharmacist if you have questions. COMMON BRAND NAME(S): Onxol, Taxol What should I tell my health care provider before I take this medicine? They need to know if you have any of these conditions:  history of irregular heartbeat  liver disease  low blood counts, like low white cell, platelet, or red cell counts  lung or breathing disease, like asthma  tingling of the fingers or toes, or other nerve disorder  an unusual or allergic reaction to paclitaxel, alcohol,  polyoxyethylated castor oil, other chemotherapy, other medicines, foods, dyes, or preservatives  pregnant or trying to get pregnant  breast-feeding How should I use this medicine? This drug is given as an infusion into a vein. It is administered in a hospital or clinic by a specially trained health care professional. Talk to your pediatrician regarding the use of this medicine in children. Special care may be needed. Overdosage: If you think you have taken too much of this medicine contact a poison control center or emergency room at once. NOTE: This medicine is only for you. Do not share this medicine with others. What if I miss a dose? It is important not to miss your dose. Call your doctor or health care professional if you are unable to keep an appointment. What may interact with this medicine? Do not take this medicine with any of the following medications:  disulfiram  metronidazole This medicine may also interact with the following medications:  antiviral medicines for hepatitis, HIV or AIDS  certain antibiotics like erythromycin and clarithromycin  certain medicines for fungal infections like ketoconazole and itraconazole  certain medicines for seizures like carbamazepine, phenobarbital, phenytoin  gemfibrozil  nefazodone  rifampin  St. John's wort This list may not describe all possible interactions. Give your health care provider a list of all the medicines, herbs, non-prescription drugs, or dietary supplements you use. Also tell them if you smoke, drink alcohol, or use illegal drugs. Some items may interact with your medicine. What should I watch for while using this medicine? Your condition will be monitored carefully while you are receiving this medicine. You will need important blood   work done while you are taking this medicine. This medicine can cause serious allergic reactions. To reduce your risk you will need to take other medicine(s) before treatment with this  medicine. If you experience allergic reactions like skin rash, itching or hives, swelling of the face, lips, or tongue, tell your doctor or health care professional right away. In some cases, you may be given additional medicines to help with side effects. Follow all directions for their use. This drug may make you feel generally unwell. This is not uncommon, as chemotherapy can affect healthy cells as well as cancer cells. Report any side effects. Continue your course of treatment even though you feel ill unless your doctor tells you to stop. Call your doctor or health care professional for advice if you get a fever, chills or sore throat, or other symptoms of a cold or flu. Do not treat yourself. This drug decreases your body's ability to fight infections. Try to avoid being around people who are sick. This medicine may increase your risk to bruise or bleed. Call your doctor or health care professional if you notice any unusual bleeding. Be careful brushing and flossing your teeth or using a toothpick because you may get an infection or bleed more easily. If you have any dental work done, tell your dentist you are receiving this medicine. Avoid taking products that contain aspirin, acetaminophen, ibuprofen, naproxen, or ketoprofen unless instructed by your doctor. These medicines may hide a fever. Do not become pregnant while taking this medicine. Women should inform their doctor if they wish to become pregnant or think they might be pregnant. There is a potential for serious side effects to an unborn child. Talk to your health care professional or pharmacist for more information. Do not breast-feed an infant while taking this medicine. Men are advised not to father a child while receiving this medicine. This product may contain alcohol. Ask your pharmacist or healthcare provider if this medicine contains alcohol. Be sure to tell all healthcare providers you are taking this medicine. Certain medicines,  like metronidazole and disulfiram, can cause an unpleasant reaction when taken with alcohol. The reaction includes flushing, headache, nausea, vomiting, sweating, and increased thirst. The reaction can last from 30 minutes to several hours. What side effects may I notice from receiving this medicine? Side effects that you should report to your doctor or health care professional as soon as possible:  allergic reactions like skin rash, itching or hives, swelling of the face, lips, or tongue  breathing problems  changes in vision  fast, irregular heartbeat  high or low blood pressure  mouth sores  pain, tingling, numbness in the hands or feet  signs of decreased platelets or bleeding - bruising, pinpoint red spots on the skin, black, tarry stools, blood in the urine  signs of decreased red blood cells - unusually weak or tired, feeling faint or lightheaded, falls  signs of infection - fever or chills, cough, sore throat, pain or difficulty passing urine  signs and symptoms of liver injury like dark yellow or brown urine; general ill feeling or flu-like symptoms; light-colored stools; loss of appetite; nausea; right upper belly pain; unusually weak or tired; yellowing of the eyes or skin  swelling of the ankles, feet, hands  unusually slow heartbeat Side effects that usually do not require medical attention (report to your doctor or health care professional if they continue or are bothersome):  diarrhea  hair loss  loss of appetite  muscle or joint pain    nausea, vomiting  pain, redness, or irritation at site where injected  tiredness This list may not describe all possible side effects. Call your doctor for medical advice about side effects. You may report side effects to FDA at 1-800-FDA-1088. Where should I keep my medicine? This drug is given in a hospital or clinic and will not be stored at home. NOTE: This sheet is a summary. It may not cover all possible information.  If you have questions about this medicine, talk to your doctor, pharmacist, or health care provider.  2020 Elsevier/Gold Standard (2017-05-21 13:14:55)  Carboplatin injection What is this medicine? CARBOPLATIN (KAR boe pla tin) is a chemotherapy drug. It targets fast dividing cells, like cancer cells, and causes these cells to die. This medicine is used to treat ovarian cancer and many other cancers. This medicine may be used for other purposes; ask your health care provider or pharmacist if you have questions. COMMON BRAND NAME(S): Paraplatin What should I tell my health care provider before I take this medicine? They need to know if you have any of these conditions:  blood disorders  hearing problems  kidney disease  recent or ongoing radiation therapy  an unusual or allergic reaction to carboplatin, cisplatin, other chemotherapy, other medicines, foods, dyes, or preservatives  pregnant or trying to get pregnant  breast-feeding How should I use this medicine? This drug is usually given as an infusion into a vein. It is administered in a hospital or clinic by a specially trained health care professional. Talk to your pediatrician regarding the use of this medicine in children. Special care may be needed. Overdosage: If you think you have taken too much of this medicine contact a poison control center or emergency room at once. NOTE: This medicine is only for you. Do not share this medicine with others. What if I miss a dose? It is important not to miss a dose. Call your doctor or health care professional if you are unable to keep an appointment. What may interact with this medicine?  medicines for seizures  medicines to increase blood counts like filgrastim, pegfilgrastim, sargramostim  some antibiotics like amikacin, gentamicin, neomycin, streptomycin, tobramycin  vaccines Talk to your doctor or health care professional before taking any of these  medicines:  acetaminophen  aspirin  ibuprofen  ketoprofen  naproxen This list may not describe all possible interactions. Give your health care provider a list of all the medicines, herbs, non-prescription drugs, or dietary supplements you use. Also tell them if you smoke, drink alcohol, or use illegal drugs. Some items may interact with your medicine. What should I watch for while using this medicine? Your condition will be monitored carefully while you are receiving this medicine. You will need important blood work done while you are taking this medicine. This drug may make you feel generally unwell. This is not uncommon, as chemotherapy can affect healthy cells as well as cancer cells. Report any side effects. Continue your course of treatment even though you feel ill unless your doctor tells you to stop. In some cases, you may be given additional medicines to help with side effects. Follow all directions for their use. Call your doctor or health care professional for advice if you get a fever, chills or sore throat, or other symptoms of a cold or flu. Do not treat yourself. This drug decreases your body's ability to fight infections. Try to avoid being around people who are sick. This medicine may increase your risk to bruise or bleed.   Call your doctor or health care professional if you notice any unusual bleeding. Be careful brushing and flossing your teeth or using a toothpick because you may get an infection or bleed more easily. If you have any dental work done, tell your dentist you are receiving this medicine. Avoid taking products that contain aspirin, acetaminophen, ibuprofen, naproxen, or ketoprofen unless instructed by your doctor. These medicines may hide a fever. Do not become pregnant while taking this medicine. Women should inform their doctor if they wish to become pregnant or think they might be pregnant. There is a potential for serious side effects to an unborn child. Talk  to your health care professional or pharmacist for more information. Do not breast-feed an infant while taking this medicine. What side effects may I notice from receiving this medicine? Side effects that you should report to your doctor or health care professional as soon as possible:  allergic reactions like skin rash, itching or hives, swelling of the face, lips, or tongue  signs of infection - fever or chills, cough, sore throat, pain or difficulty passing urine  signs of decreased platelets or bleeding - bruising, pinpoint red spots on the skin, black, tarry stools, nosebleeds  signs of decreased red blood cells - unusually weak or tired, fainting spells, lightheadedness  breathing problems  changes in hearing  changes in vision  chest pain  high blood pressure  low blood counts - This drug may decrease the number of white blood cells, red blood cells and platelets. You may be at increased risk for infections and bleeding.  nausea and vomiting  pain, swelling, redness or irritation at the injection site  pain, tingling, numbness in the hands or feet  problems with balance, talking, walking  trouble passing urine or change in the amount of urine Side effects that usually do not require medical attention (report to your doctor or health care professional if they continue or are bothersome):  hair loss  loss of appetite  metallic taste in the mouth or changes in taste This list may not describe all possible side effects. Call your doctor for medical advice about side effects. You may report side effects to FDA at 1-800-FDA-1088. Where should I keep my medicine? This drug is given in a hospital or clinic and will not be stored at home. NOTE: This sheet is a summary. It may not cover all possible information. If you have questions about this medicine, talk to your doctor, pharmacist, or health care provider.  2020 Elsevier/Gold Standard (2007-12-23 14:38:05)     

## 2020-08-08 ENCOUNTER — Emergency Department (HOSPITAL_COMMUNITY)
Admission: EM | Admit: 2020-08-08 | Discharge: 2020-08-08 | Disposition: A | Discharge status: Home / Self Care | Payer: Medicare Other | Attending: Emergency Medicine | Admitting: Emergency Medicine

## 2020-08-08 ENCOUNTER — Other Ambulatory Visit: Payer: Self-pay

## 2020-08-08 DIAGNOSIS — K625 Hemorrhage of anus and rectum: Secondary | ICD-10-CM | POA: Insufficient documentation

## 2020-08-08 DIAGNOSIS — N183 Chronic kidney disease, stage 3 unspecified: Secondary | ICD-10-CM | POA: Insufficient documentation

## 2020-08-08 DIAGNOSIS — Z87891 Personal history of nicotine dependence: Secondary | ICD-10-CM | POA: Insufficient documentation

## 2020-08-08 DIAGNOSIS — Z7984 Long term (current) use of oral hypoglycemic drugs: Secondary | ICD-10-CM | POA: Insufficient documentation

## 2020-08-08 DIAGNOSIS — E1122 Type 2 diabetes mellitus with diabetic chronic kidney disease: Secondary | ICD-10-CM | POA: Insufficient documentation

## 2020-08-08 DIAGNOSIS — Z79899 Other long term (current) drug therapy: Secondary | ICD-10-CM | POA: Insufficient documentation

## 2020-08-08 DIAGNOSIS — I129 Hypertensive chronic kidney disease with stage 1 through stage 4 chronic kidney disease, or unspecified chronic kidney disease: Secondary | ICD-10-CM | POA: Diagnosis not present

## 2020-08-08 DIAGNOSIS — K921 Melena: Secondary | ICD-10-CM | POA: Diagnosis present

## 2020-08-08 DIAGNOSIS — E039 Hypothyroidism, unspecified: Secondary | ICD-10-CM | POA: Diagnosis not present

## 2020-08-08 LAB — CBC WITH DIFFERENTIAL/PLATELET
Abs Immature Granulocytes: 0.02 10*3/uL (ref 0.00–0.07)
Basophils Absolute: 0 10*3/uL (ref 0.0–0.1)
Basophils Relative: 1 %
Eosinophils Absolute: 0.1 10*3/uL (ref 0.0–0.5)
Eosinophils Relative: 2 %
HCT: 29.4 % — ABNORMAL LOW (ref 36.0–46.0)
Hemoglobin: 9.3 g/dL — ABNORMAL LOW (ref 12.0–15.0)
Immature Granulocytes: 0 %
Lymphocytes Relative: 17 %
Lymphs Abs: 0.8 10*3/uL (ref 0.7–4.0)
MCH: 28.7 pg (ref 26.0–34.0)
MCHC: 31.6 g/dL (ref 30.0–36.0)
MCV: 90.7 fL (ref 80.0–100.0)
Monocytes Absolute: 0.2 10*3/uL (ref 0.1–1.0)
Monocytes Relative: 4 %
Neutro Abs: 3.9 10*3/uL (ref 1.7–7.7)
Neutrophils Relative %: 76 %
Platelets: 355 10*3/uL (ref 150–400)
RBC: 3.24 MIL/uL — ABNORMAL LOW (ref 3.87–5.11)
RDW: 21.8 % — ABNORMAL HIGH (ref 11.5–15.5)
WBC: 5 10*3/uL (ref 4.0–10.5)
nRBC: 0 % (ref 0.0–0.2)

## 2020-08-08 LAB — COMPREHENSIVE METABOLIC PANEL
ALT: 22 U/L (ref 0–44)
AST: 27 U/L (ref 15–41)
Albumin: 3.6 g/dL (ref 3.5–5.0)
Alkaline Phosphatase: 88 U/L (ref 38–126)
Anion gap: 12 (ref 5–15)
BUN: 35 mg/dL — ABNORMAL HIGH (ref 8–23)
CO2: 25 mmol/L (ref 22–32)
Calcium: 9.1 mg/dL (ref 8.9–10.3)
Chloride: 101 mmol/L (ref 98–111)
Creatinine, Ser: 0.93 mg/dL (ref 0.44–1.00)
GFR, Estimated: >60 mL/min (ref >60)
Glucose, Bld: 180 mg/dL — ABNORMAL HIGH (ref 70–99)
Potassium: 3.8 mmol/L (ref 3.5–5.1)
Sodium: 138 mmol/L (ref 135–145)
Total Bilirubin: 0.3 mg/dL (ref 0.3–1.2)
Total Protein: 8.2 g/dL — ABNORMAL HIGH (ref 6.5–8.1)

## 2020-08-08 LAB — PROTIME-INR
INR: 1.3 — ABNORMAL HIGH (ref 0.8–1.2)
Prothrombin Time: 16 seconds — ABNORMAL HIGH (ref 11.4–15.2)

## 2020-08-08 LAB — TYPE AND SCREEN
ABO/RH(D): A POS
Antibody Screen: NEGATIVE

## 2020-08-08 LAB — POC OCCULT BLOOD, ED: Fecal Occult Bld: POSITIVE — AB

## 2020-08-08 NOTE — ED Notes (Signed)
Pt requesting to have porta cath accessed for IV access and blood work. IV team consult placed. Pt in NAD. Resting comfortably in bed with bed in low position. Resp even and unlabored. Call light within reach.

## 2020-08-08 NOTE — ED Triage Notes (Signed)
Pt arrives to ED BIB [facility] for rectal bleeding. Pt has ovarian cancer and receives treatment at [facility]. Per EMS pt has been constipated x1 week and has been having bowel movements since 2000 yesterday and when pt would wipe bright red blood was noted. Denies Abd Pain, No N/V/D. Pt a/o x4.  BP 143/78  HR 78 R 18 96 RA CBG 231

## 2020-08-08 NOTE — ED Provider Notes (Signed)
[facility] EMERGENCY DEPARTMENT Provider Note   CSN: [id number] Arrival date & time: 08/08/20  1017     History Chief Complaint  Patient presents with  . Rectal Bleeding    Amber Gray is a 79 y.o. female.  Patient with a couple days of dark stools.  Also been having some constipation so started some Metamucil.  She presents here she had episode of constipation and shortly after she noticed some bright red blood on the toilet paper that she wiped with.  No blood in the stools.  No blood streaking of the stool.  No lightheadedness, chest pain, weakness or pallor.  She does have history of cancer she is currently on chemo for that without any issues.  No fevers.  She is on Eliquis and is compliant with the same.  The history is provided by the patient.  Rectal Bleeding      Past Medical History:  Diagnosis Date  . Anxiety   . Arthritis    "all over my body" (05/27/2017)  . Chronic lower back pain   . Depression   . Dyspnea    allergy related SOB  . Frequent UTI   . GERD (gastroesophageal reflux disease)   . Headache    "monthly" (05/27/2017)  . History of blood transfusion    "when I had my neck fusion"  . History of hiatal hernia   . Hypercholesteremia   . Hypertension   . Hypothyroid   . Pneumonia 2000s X 1; 05/27/2017  . Seasonal allergies   . Type II diabetes mellitus [facility])     Patient Active Problem List   Diagnosis Date Noted  . Pancytopenia, acquired ([place]) 07/25/2020  . Deficiency anemia 07/25/2020  . Generalized weakness 07/25/2020  . Bilateral leg edema 06/22/2020  . Chronic nausea 06/22/2020  . Ovarian CA, unspecified laterality ([place]) 05/30/2020  . Other constipation 05/30/2020  . Preventive measure 05/30/2020  . Acute pulmonary embolism ([place]) 05/21/2020  . Acute respiratory failure with hypoxia ([place]) 05/21/2020  . Pleural effusion 05/21/2020  . AKI (acute kidney injury) ([place]) 05/21/2020  . Lactic acidosis 05/21/2020  .  Impingement of right ankle joint   . Achilles tendinitis, right leg 12/01/2018  . Synovitis of knee 02/05/2018  . 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 Sepsis due to pneumonia ([place]) 05/27/2017  . CKD (chronic kidney disease), stage III ([place]) 05/27/2017  . Diabetes mellitus type 2, uncontrolled ([place]) 05/27/2017  . Hypothyroid 05/27/2017  . Hypercholesteremia 05/27/2017  . Acute hypokalemia 05/27/2017  . HTN (hypertension) 05/27/2017  . Community acquired pneumonia of left lower lobe of lung   . Diabetes mellitus with complication ([place])   . Type 2 diabetes mellitus ([place]) 06/27/2015  . Hypertension 06/27/2015  . Hypothyroidism 06/27/2015  . Chest pain 06/27/2015  . Precordial chest pain     Past Surgical History:  Procedure Laterality Date  . ANKLE ARTHROSCOPY Right 03/29/2020   Procedure: RIGHT ANKLE ARTHROSCOPY AND DEBRIDEMENT;  Surgeon: Newt Minion, MD;  Location: [facility];  Service: Orthopedics;  Laterality: Right;  . BACK SURGERY    . BREAST BIOPSY Left   . CATARACT EXTRACTION W/ INTRAOCULAR LENS  IMPLANT, BILATERAL Bilateral   . COLONOSCOPY  10/25/2005   normal  . DILATION AND CURETTAGE OF UTERUS    . IR IMAGING GUIDED PORT INSERTION  05/24/2020  . IR PARACENTESIS  05/24/2020  . PARTIAL HYSTERECTOMY  1968  . POSTERIOR CERVICAL FUSION/FORAMINOTOMY     "fused  4,5,6"  . TONSILLECTOMY       OB History   No obstetric history on file.     Family History  Problem Relation Age of Onset  . Alzheimer's disease Mother   . Heart attack Father   . Heart disease Father   . Dwarfism Sister   . Colon cancer Neg Hx   . Colon polyps Neg Hx   . Esophageal cancer Neg Hx   . Rectal cancer Neg Hx   . Stomach cancer Neg Hx     Social History   Tobacco Use  . Smoking status: Former Smoker    Packs/day: 1.00    Years: 6.00    Pack years: 6.00    Types: Cigarettes    Quit date: 10/01/1964    Years since quitting: 55.8  . Smokeless tobacco:  Never Used  Vaping Use  . Vaping Use: Never used  Substance Use Topics  . Alcohol use: No  . Drug use: No    Home Medications Prior to Admission medications   Medication Sig Start Date End Date Taking? Authorizing Provider  acetaminophen (TYLENOL) 325 MG tablet Take 650 mg by mouth every 6 (six) hours as needed for mild pain.   Yes [provider]  albuterol (PROVENTIL HFA;VENTOLIN HFA) 108 (90 BASE) MCG/ACT inhaler Inhale 2 puffs into the lungs every 6 (six) hours as needed. For wheezing.   Yes [provider]  apixaban (ELIQUIS) 5 MG TABS tablet Take 1 tablet (5 mg total) by mouth 2 (two) times daily. 06/01/20  Yes Dessa Phi, DO  dexamethasone (DECADRON) 4 MG tablet Take 2 tabs at the night before chemotherapy, every 3 weeks, by mouth x 6 cycles 06/09/20  Yes Gorsuch, Ni, MD  empagliflozin (JARDIANCE) 10 MG TABS tablet Take 10 mg by mouth daily.   Yes [provider]  furosemide (LASIX) 40 MG tablet Take 40 mg by mouth daily.  04/04/17  Yes [provider]  glimepiride (AMARYL) 1 MG tablet Take 1 mg by mouth daily.  05/09/17  Yes [provider]  levothyroxine (SYNTHROID) 88 MCG tablet Take 88 mcg by mouth daily before breakfast.    Yes [provider]  lidocaine-prilocaine (EMLA) cream Apply 1 application topically daily as needed. 06/09/20  Yes Gorsuch, Ni, MD  omeprazole (PRILOSEC) 20 MG capsule Take 20 mg by mouth daily. 04/04/17  Yes [provider]  prochlorperazine (COMPAZINE) 10 MG tablet Take 1 tablet (10 mg total) by mouth every 6 (six) hours as needed for nausea or vomiting. 06/09/20  Yes Gorsuch, Ni, MD  sertraline (ZOLOFT) 50 MG tablet Take 50 mg by mouth daily. 06/17/18  Yes [provider]  simvastatin (ZOCOR) 40 MG tablet Take 40 mg by mouth daily. 05/12/18  Yes [provider]  TRADJENTA 5 MG TABS tablet Take 5 mg by mouth daily. 08/15/18  Yes [provider]  ondansetron (ZOFRAN) 8 MG tablet  Take 1 tablet (8 mg total) by mouth every 8 (eight) hours as needed for nausea. Patient not taking: Reported on 08/08/2020 06/09/20   [name], MD    Allergies    Metformin and related  Review of Systems   Review of Systems  Gastrointestinal: Positive for hematochezia.  All other systems reviewed and are negative.   Physical Exam Updated Vital Signs BP 128/65   Pulse 62   Temp 98 F (36.7 C) (Oral)   Resp 12   Ht 5' (1.524 m)   Wt 49.7 kg   SpO2  99%   BMI 21.40 kg/m   Physical Exam Vitals and nursing note reviewed. Exam conducted with a chaperone present.  Constitutional:      Appearance: She is well-developed. She is cachectic.  HENT:     Head: Normocephalic and atraumatic.     Mouth/Throat:     Mouth: Mucous membranes are moist.  Cardiovascular:     Rate and Rhythm: Normal rate and regular rhythm.     Pulses: Normal pulses.     Heart sounds: No murmur heard.   Pulmonary:     Effort: No respiratory distress.     Breath sounds: No stridor.  Abdominal:     General: Abdomen is flat. There is no distension.  Genitourinary:    Rectum: Guaiac result positive. Tenderness (mild diffuse rectal related to exam) present. No mass, anal fissure, external hemorrhoid or internal hemorrhoid. Normal anal tone.  Musculoskeletal:        General: No swelling or tenderness. Normal range of motion.     Cervical back: Normal range of motion.  Skin:    General: Skin is warm and dry.  Neurological:     General: No focal deficit present.     Mental Status: She is alert.     ED Results / Procedures / Treatments   Labs (all labs ordered are listed, but only abnormal results are displayed) Labs Reviewed  CBC WITH DIFFERENTIAL/PLATELET - Abnormal; Notable for the following components:      Result Value   RBC 3.24 (*)    Hemoglobin 9.3 (*)    HCT 29.4 (*)    RDW 21.8 (*)    All other components within normal limits  COMPREHENSIVE METABOLIC PANEL - Abnormal; Notable for the  following components:   Glucose, Bld 180 (*)    BUN 35 (*)    Total Protein 8.2 (*)    All other components within normal limits  PROTIME-INR - Abnormal; Notable for the following components:   Prothrombin Time 16.0 (*)    INR 1.3 (*)    All other components within normal limits  POC OCCULT BLOOD, ED - Abnormal; Notable for the following components:   Fecal Occult Bld POSITIVE (*)    All other components within normal limits  TYPE AND SCREEN  ABO/RH    EKG None  Radiology No results found.  Procedures Procedures (including critical care time)  Medications Ordered in ED Medications - No data to display  ED Course  I have reviewed the triage vital signs and the nursing notes.  Pertinent labs & imaging results that were available during my care of the patient were reviewed by me and considered in my medical decision making (see chart for details).    MDM Rules/Calculators/A&P                          Patient here with dark stools and Hemoccult positive.  No significant change in her hemoglobin.  Her platelets are normal.  Vital signs are stable.  She is observed in the emergency room for approximately 3 hours while waiting results and did not have any more bloody stools.  No evidence of acute blood loss anemia. After discussion with the patient and shared decision making we will plan for discharge with close follow-up.  Will return here for any worsening symptoms.  Final Clinical Impression(s) / ED Diagnoses Final diagnoses:  Rectal bleeding    Rx / DC Orders ED Discharge Orders    None  Brayley Mackowiak, Corene Cornea, MD 08/08/20 [phone]

## 2020-08-29 ENCOUNTER — Ambulatory Visit (HOSPITAL_COMMUNITY)
Admission: RE | Admit: 2020-08-29 | Discharge: 2020-08-29 | Disposition: A | Discharge status: Home / Self Care | Payer: Medicare Other | Source: Ambulatory Visit | Attending: Hematology and Oncology | Admitting: Hematology and Oncology

## 2020-08-29 ENCOUNTER — Other Ambulatory Visit: Payer: Self-pay

## 2020-08-29 DIAGNOSIS — C786 Secondary malignant neoplasm of retroperitoneum and peritoneum: Secondary | ICD-10-CM | POA: Diagnosis not present

## 2020-08-29 DIAGNOSIS — Z1289 Encounter for screening for malignant neoplasm of other sites: Secondary | ICD-10-CM | POA: Insufficient documentation

## 2020-08-29 DIAGNOSIS — C569 Malignant neoplasm of unspecified ovary: Secondary | ICD-10-CM | POA: Insufficient documentation

## 2020-08-29 DIAGNOSIS — Z86711 Personal history of pulmonary embolism: Secondary | ICD-10-CM | POA: Insufficient documentation

## 2020-08-29 DIAGNOSIS — Z122 Encounter for screening for malignant neoplasm of respiratory organs: Secondary | ICD-10-CM | POA: Diagnosis not present

## 2020-08-29 MED ORDER — IOHEXOL 300 MG/ML  SOLN
100.0000 mL | Freq: Once | INTRAMUSCULAR | Status: AC | PRN
  Administered 2020-08-29: 100 mL via INTRAVENOUS

## 2020-08-30 ENCOUNTER — Inpatient Hospital Stay (HOSPITAL_BASED_OUTPATIENT_CLINIC_OR_DEPARTMENT_OTHER): Payer: Medicare Other | Admitting: Hematology and Oncology

## 2020-08-30 ENCOUNTER — Inpatient Hospital Stay: Payer: Medicare Other

## 2020-08-30 ENCOUNTER — Other Ambulatory Visit: Payer: Self-pay

## 2020-08-30 DIAGNOSIS — K5909 Other constipation: Secondary | ICD-10-CM | POA: Diagnosis not present

## 2020-08-30 DIAGNOSIS — I2692 Saddle embolus of pulmonary artery without acute cor pulmonale: Secondary | ICD-10-CM

## 2020-08-30 DIAGNOSIS — C569 Malignant neoplasm of unspecified ovary: Secondary | ICD-10-CM

## 2020-08-30 DIAGNOSIS — Z5111 Encounter for antineoplastic chemotherapy: Secondary | ICD-10-CM | POA: Diagnosis not present

## 2020-08-30 DIAGNOSIS — R634 Abnormal weight loss: Secondary | ICD-10-CM

## 2020-08-30 DIAGNOSIS — N183 Chronic kidney disease, stage 3 unspecified: Secondary | ICD-10-CM

## 2020-08-30 LAB — CBC WITH DIFFERENTIAL (CANCER CENTER ONLY)
Abs Immature Granulocytes: 0.03 10*3/uL (ref 0.00–0.07)
Basophils Absolute: 0 10*3/uL (ref 0.0–0.1)
Basophils Relative: 1 %
Eosinophils Absolute: 0.2 10*3/uL (ref 0.0–0.5)
Eosinophils Relative: 3 %
HCT: 29.8 % — ABNORMAL LOW (ref 36.0–46.0)
Hemoglobin: 9.4 g/dL — ABNORMAL LOW (ref 12.0–15.0)
Immature Granulocytes: 0 %
Lymphocytes Relative: 18 %
Lymphs Abs: 1.4 10*3/uL (ref 0.7–4.0)
MCH: 29.1 pg (ref 26.0–34.0)
MCHC: 31.5 g/dL (ref 30.0–36.0)
MCV: 92.3 fL (ref 80.0–100.0)
Monocytes Absolute: 0.5 10*3/uL (ref 0.1–1.0)
Monocytes Relative: 7 %
Neutro Abs: 5.4 10*3/uL (ref 1.7–7.7)
Neutrophils Relative %: 71 %
Platelet Count: 203 10*3/uL (ref 150–400)
RBC: 3.23 MIL/uL — ABNORMAL LOW (ref 3.87–5.11)
RDW: 21.6 % — ABNORMAL HIGH (ref 11.5–15.5)
WBC Count: 7.5 10*3/uL (ref 4.0–10.5)
nRBC: 0 % (ref 0.0–0.2)

## 2020-08-30 LAB — CMP (CANCER CENTER ONLY)
ALT: 13 U/L (ref 0–44)
AST: 18 U/L (ref 15–41)
Albumin: 3.5 g/dL (ref 3.5–5.0)
Alkaline Phosphatase: 87 U/L (ref 38–126)
Anion gap: 10 (ref 5–15)
BUN: 16 mg/dL (ref 8–23)
CO2: 26 mmol/L (ref 22–32)
Calcium: 9.5 mg/dL (ref 8.9–10.3)
Chloride: 104 mmol/L (ref 98–111)
Creatinine: 0.96 mg/dL (ref 0.44–1.00)
GFR, Estimated: 60 mL/min — ABNORMAL LOW (ref >60)
Glucose, Bld: 124 mg/dL — ABNORMAL HIGH (ref 70–99)
Potassium: 3.7 mmol/L (ref 3.5–5.1)
Sodium: 140 mmol/L (ref 135–145)
Total Bilirubin: 0.4 mg/dL (ref 0.3–1.2)
Total Protein: 7.9 g/dL (ref 6.5–8.1)

## 2020-08-30 MED ORDER — APIXABAN 2.5 MG PO TABS
2.5000 mg | ORAL_TABLET | Freq: Two times a day (BID) | ORAL | 11 refills | Status: DC
Start: 2020-08-30 — End: 2020-10-07

## 2020-08-31 ENCOUNTER — Inpatient Hospital Stay: Payer: Medicare Other | Attending: Hematology and Oncology

## 2020-08-31 ENCOUNTER — Other Ambulatory Visit: Payer: Self-pay

## 2020-08-31 ENCOUNTER — Encounter: Payer: Self-pay | Admitting: Hematology and Oncology

## 2020-08-31 ENCOUNTER — Telehealth: Payer: Self-pay | Admitting: Oncology

## 2020-08-31 VITALS — BP 122/60 | HR 62 | Temp 98.1°F | Resp 17

## 2020-08-31 DIAGNOSIS — C786 Secondary malignant neoplasm of retroperitoneum and peritoneum: Secondary | ICD-10-CM | POA: Insufficient documentation

## 2020-08-31 DIAGNOSIS — E039 Hypothyroidism, unspecified: Secondary | ICD-10-CM | POA: Insufficient documentation

## 2020-08-31 DIAGNOSIS — Z86711 Personal history of pulmonary embolism: Secondary | ICD-10-CM | POA: Diagnosis not present

## 2020-08-31 DIAGNOSIS — F32A Depression, unspecified: Secondary | ICD-10-CM | POA: Diagnosis not present

## 2020-08-31 DIAGNOSIS — Z79899 Other long term (current) drug therapy: Secondary | ICD-10-CM | POA: Diagnosis not present

## 2020-08-31 DIAGNOSIS — Z86718 Personal history of other venous thrombosis and embolism: Secondary | ICD-10-CM | POA: Insufficient documentation

## 2020-08-31 DIAGNOSIS — Z87891 Personal history of nicotine dependence: Secondary | ICD-10-CM | POA: Insufficient documentation

## 2020-08-31 DIAGNOSIS — Z5111 Encounter for antineoplastic chemotherapy: Secondary | ICD-10-CM | POA: Insufficient documentation

## 2020-08-31 DIAGNOSIS — M1990 Unspecified osteoarthritis, unspecified site: Secondary | ICD-10-CM | POA: Insufficient documentation

## 2020-08-31 DIAGNOSIS — C7989 Secondary malignant neoplasm of other specified sites: Secondary | ICD-10-CM | POA: Insufficient documentation

## 2020-08-31 DIAGNOSIS — E119 Type 2 diabetes mellitus without complications: Secondary | ICD-10-CM | POA: Insufficient documentation

## 2020-08-31 DIAGNOSIS — Z7901 Long term (current) use of anticoagulants: Secondary | ICD-10-CM | POA: Insufficient documentation

## 2020-08-31 DIAGNOSIS — E7800 Pure hypercholesterolemia, unspecified: Secondary | ICD-10-CM | POA: Diagnosis not present

## 2020-08-31 DIAGNOSIS — I10 Essential (primary) hypertension: Secondary | ICD-10-CM | POA: Insufficient documentation

## 2020-08-31 DIAGNOSIS — R634 Abnormal weight loss: Secondary | ICD-10-CM | POA: Insufficient documentation

## 2020-08-31 DIAGNOSIS — F419 Anxiety disorder, unspecified: Secondary | ICD-10-CM | POA: Insufficient documentation

## 2020-08-31 DIAGNOSIS — C569 Malignant neoplasm of unspecified ovary: Secondary | ICD-10-CM | POA: Diagnosis present

## 2020-08-31 DIAGNOSIS — Z7984 Long term (current) use of oral hypoglycemic drugs: Secondary | ICD-10-CM | POA: Insufficient documentation

## 2020-08-31 DIAGNOSIS — K219 Gastro-esophageal reflux disease without esophagitis: Secondary | ICD-10-CM | POA: Insufficient documentation

## 2020-08-31 LAB — CA 125: Cancer Antigen (CA) 125: 359 U/mL — ABNORMAL HIGH (ref 0.0–38.1)

## 2020-08-31 MED ORDER — FAMOTIDINE IN NACL 20-0.9 MG/50ML-% IV SOLN
20.0000 mg | Freq: Once | INTRAVENOUS | Status: AC
  Administered 2020-08-31: 20 mg via INTRAVENOUS

## 2020-08-31 MED ORDER — SODIUM CHLORIDE 0.9 % IV SOLN
238.8000 mg | Freq: Once | INTRAVENOUS | Status: AC
  Administered 2020-08-31: 240 mg via INTRAVENOUS
  Filled 2020-08-31: qty 24

## 2020-08-31 MED ORDER — PALONOSETRON HCL INJECTION 0.25 MG/5ML
0.2500 mg | Freq: Once | INTRAVENOUS | Status: AC
  Administered 2020-08-31: 0.25 mg via INTRAVENOUS

## 2020-08-31 MED ORDER — DIPHENHYDRAMINE HCL 50 MG/ML IJ SOLN
INTRAMUSCULAR | Status: AC
  Filled 2020-08-31: qty 1

## 2020-08-31 MED ORDER — SODIUM CHLORIDE 0.9% FLUSH
10.0000 mL | INTRAVENOUS | Status: DC | PRN
  Filled 2020-08-31: qty 10

## 2020-08-31 MED ORDER — SODIUM CHLORIDE 0.9 % IV SOLN
Freq: Once | INTRAVENOUS | Status: AC
  Filled 2020-08-31: qty 250

## 2020-08-31 MED ORDER — DIPHENHYDRAMINE HCL 50 MG/ML IJ SOLN
12.5000 mg | Freq: Once | INTRAMUSCULAR | Status: AC
  Administered 2020-08-31: 12.5 mg via INTRAVENOUS

## 2020-08-31 MED ORDER — HEPARIN SOD (PORK) LOCK FLUSH 100 UNIT/ML IV SOLN
500.0000 [IU] | Freq: Once | INTRAVENOUS | Status: DC | PRN
  Filled 2020-08-31: qty 5

## 2020-08-31 MED ORDER — PALONOSETRON HCL INJECTION 0.25 MG/5ML
INTRAVENOUS | Status: AC
  Filled 2020-08-31: qty 5

## 2020-08-31 MED ORDER — SODIUM CHLORIDE 0.9 % IV SOLN
10.0000 mg | Freq: Once | INTRAVENOUS | Status: AC
  Administered 2020-08-31: 10 mg via INTRAVENOUS
  Filled 2020-08-31: qty 10

## 2020-08-31 MED ORDER — SODIUM CHLORIDE 0.9 % IV SOLN
150.0000 mg | Freq: Once | INTRAVENOUS | Status: AC
  Administered 2020-08-31: 150 mg via INTRAVENOUS
  Filled 2020-08-31: qty 150

## 2020-08-31 MED ORDER — FAMOTIDINE IN NACL 20-0.9 MG/50ML-% IV SOLN
INTRAVENOUS | Status: AC
  Filled 2020-08-31: qty 50

## 2020-08-31 MED ORDER — SODIUM CHLORIDE 0.9 % IV SOLN
140.0000 mg/m2 | Freq: Once | INTRAVENOUS | Status: AC
  Administered 2020-08-31: 204 mg via INTRAVENOUS
  Filled 2020-08-31: qty 34

## 2020-08-31 NOTE — Assessment & Plan Note (Signed)
Her renal function is back to normal We will adjust the dose of chemotherapy accordingly

## 2020-08-31 NOTE — Progress Notes (Signed)
Pt discharged in no apparent distress. Pt left in a wheelchair.  Pt aware of discharge instructions and verbalized understanding and had no further questions.

## 2020-08-31 NOTE — Assessment & Plan Note (Signed)
The blood clot in her lungs are resolved She will continue anticoagulation therapy for now but given her recent bleeding, I recommend reducing the Eliquis dose to 2.5 mg twice a day She does not need bridging therapy for surgery

## 2020-08-31 NOTE — Progress Notes (Signed)
[place] OFFICE PROGRESS NOTE  Patient Care Team: [name], MD as PCP - General (Internal Medicine)  ASSESSMENT & PLAN:  Ovarian CA, unspecified laterality [facility]) I have reviewed multiple imaging studies with the patient and her daughter She have excellent response to therapy I will review her imaging study with GYN surgeon to see whether she would be a candidate for interval debulking surgery Given her recent weight loss, I will adjust the dose of her chemotherapy according to her most current weight I plan to see her within 2 to 3 weeks after her surgery for further treatment  Weight loss She had recent weight loss I will adjust the dose of treatment accordingly I recommend the patient increase oral intake as tolerated  Acute pulmonary embolism ([place]) The blood clot in her lungs are resolved She will continue anticoagulation therapy for now but given her recent bleeding, I recommend reducing the Eliquis dose to 2.5 mg twice a day She does not need bridging therapy for surgery  Other constipation The patient appears somewhat reluctant to take laxative We discussed the importance of regular laxative therapy.  CKD (chronic kidney disease), stage III Her renal function is back to normal We will adjust the dose of chemotherapy accordingly   No orders of the defined types were placed in this encounter.   All questions were answered. The patient knows to call the clinic with any problems, questions or concerns. The total time spent in the appointment was 40 minutes encounter with patients including review of chart and various tests results, discussions about plan of care and coordination of care plan   [name], MD 08/31/2020 7:54 AM  INTERVAL HISTORY: Please see below for problem oriented charting. 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  CT imaging also revealed abdominal mass.  Subsequent biopsy confirmed diagnosis of ovarian cancer   05/21/2020 Procedure   Successful ultrasound guided diagnostic and therapeutic left thoracentesis yielding 580 cc of pleural fluid.   05/21/2020 Imaging   1. Large saddle pulmonary embolus with CT evidence of right heart strain (RV/LV ratio of 1.6) consistent with at least submassive (intermediate risk) PE. The presence of right heart strain has been associated with an increased risk of morbidity and mortality. 2. Large left and small right pleural effusions. 3. Large amount of peritoneal nodularity, consistent with peritoneal carcinomatosis. 4. Large midline pelvic mass, measuring up to 12.9 x 8.7 x 9.7 cm. 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   05/21/2020 Pathology Results   Clinical History: None provided  Specimen Submitted:  A. PLEURAL FLUID,LEFT, THORACENTESIS:    FINAL MICROSCOPIC DIAGNOSIS:  - Reactive mesothelial cells present   05/24/2020 Procedure   1. Successful placement of a right internal jugular approach power injectable  Port-A-Cath. The catheter is ready for immediate use. 2. Successful ultrasound-guided paracentesis yielding 2.2 L of serous, slightly blood tinged ascitic fluid.   05/24/2020 Pathology Results   A. OMENTUM,  NEEDLE CORE BIOPSY:  -  High-grade carcinoma  -  See comment   COMMENT:   By immunohistochemistry, the neoplastic cells are positive for cytokeratin 7 and PAX 8 but negative for cytokeratin 20, CDX2 and TTF-1. 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    05/30/2020 Cancer Staging   Staging form: Ovary, Fallopian Tube, and Primary Peritoneal Carcinoma, AJCC 8th Edition - Clinical stage from 05/30/2020: FIGO Stage IIIC (cT3, cN0, cM0) - Signed by [name], MD on 05/30/2020   06/10/2020 Tumor Marker   Patient's tumor was tested for the following markers: CA-125. Results of the tumor marker test revealed [id number].   06/14/2020 -  Chemotherapy   The patient had carboplatin and taxol for chemotherapy treatment.     07/04/2020 Tumor Marker   Patient's tumor was tested for the following markers: CA-125 Results of the tumor marker test revealed [id number]   08/29/2020 Imaging   CT chest:   1. No evidence of thoracic metastasis. 2. Resolution of proximal pulmonary emboli.   CT abdomen pelvis:   1. Marked reduction in volume of bulky pelvic mass. Nodularity remains along the uterine fundus. 2. Marked reduction in the nodular portions of the omental thickening. Cystic portions of the omental metastasis remain. 3. Decrease in volume of free fluid in the abdomen pelvis. 4. No new nodularity in the peritoneal space.       REVIEW OF SYSTEMS:   Constitutional: Denies fevers, chills or Eyes: Denies blurriness of vision Ears, nose, mouth, throat, and face: Denies mucositis or sore throat Respiratory: Denies cough, dyspnea or wheezes Cardiovascular: Denies palpitation, chest discomfort or lower extremity swelling Skin: Denies abnormal skin rashes Lymphatics: Denies new lymphadenopathy or easy bruising Neurological:Denies numbness, tingling or new weaknesses Behavioral/Psych: Mood is stable, no new changes  All other  systems were reviewed with the patient and are negative.  I have reviewed the past medical history, past surgical history, social history and family history with the patient and they are unchanged from previous note.  ALLERGIES:  is allergic to metformin and related.  MEDICATIONS:  Current Outpatient Medications  Medication Sig Dispense Refill  . acetaminophen (TYLENOL) 325 MG tablet Take 650 mg by mouth every 6 (six) hours as needed for mild pain.    Marland Kitchen albuterol (PROVENTIL HFA;VENTOLIN HFA) 108 (90 BASE) MCG/ACT inhaler Inhale 2 puffs into the lungs every 6 (six) hours as needed. For wheezing.    Marland Kitchen apixaban (ELIQUIS) 2.5 MG TABS tablet Take 1 tablet (2.5 mg total) by mouth 2 (two) times daily. 60 tablet 11  . dexamethasone (DECADRON) 4 MG tablet Take 2 tabs at the night before chemotherapy, every 3 weeks, by mouth x 6 cycles 12 tablet 0  . empagliflozin (JARDIANCE) 10 MG TABS tablet Take 10 mg by mouth daily.    . furosemide (LASIX) 40 MG tablet Take 40 mg by mouth daily.   11  . glimepiride (AMARYL) 1 MG tablet Take 1 mg by mouth daily.     Marland Kitchen levothyroxine (SYNTHROID) 88 MCG tablet Take 88 mcg by mouth daily before breakfast.     . lidocaine-prilocaine (EMLA) cream Apply 1 application topically daily as needed. 30 g 11  . omeprazole (PRILOSEC) 20 MG capsule Take 20 mg by mouth daily.    Marland Kitchen  ondansetron (ZOFRAN) 8 MG tablet Take 1 tablet (8 mg total) by mouth every 8 (eight) hours as needed for nausea. (Patient not taking: Reported on 08/08/2020) 30 tablet 3  . prochlorperazine (COMPAZINE) 10 MG tablet Take 1 tablet (10 mg total) by mouth every 6 (six) hours as needed for nausea or vomiting. 30 tablet 1  . sertraline (ZOLOFT) 50 MG tablet Take 50 mg by mouth daily.  0  . simvastatin (ZOCOR) 40 MG tablet Take 40 mg by mouth daily.  3  . TRADJENTA 5 MG TABS tablet Take 5 mg by mouth daily.  3   No current facility-administered medications for this visit.    PHYSICAL EXAMINATION: ECOG  PERFORMANCE STATUS: 1 - Symptomatic but completely ambulatory  Vitals:   08/30/20 1149  BP: 122/67  Pulse: 63  Resp: 18  Temp: 98.2 F (36.8 C)  SpO2: 99%   Filed Weights   08/30/20 1149  Weight: 108 lb (49 kg)    GENERAL:alert, no distress and comfortable NEURO: alert & oriented x 3 with fluent speech, no focal motor/sensory deficits  LABORATORY DATA:  I have reviewed the data as listed    Component Value Date/Time   NA 140 08/30/2020 1213   K 3.7 08/30/2020 1213   CL 104 08/30/2020 1213   CO2 26 08/30/2020 1213   GLUCOSE 124 (H) 08/30/2020 1213   BUN 16 08/30/2020 1213   CREATININE 0.96 08/30/2020 1213   CALCIUM 9.5 08/30/2020 1213   PROT 7.9 08/30/2020 1213   ALBUMIN 3.5 08/30/2020 1213   AST 18 08/30/2020 1213   ALT 13 08/30/2020 1213   ALKPHOS 87 08/30/2020 1213   BILITOT 0.4 08/30/2020 1213   GFRNONAA 60 (L) 08/30/2020 1213   GFRAA 59 (L) 07/04/2020 1217    No results found for: SPEP, UPEP  Lab Results  Component Value Date   WBC 7.5 08/30/2020   NEUTROABS 5.4 08/30/2020   HGB 9.4 (L) 08/30/2020   HCT 29.8 (L) 08/30/2020   MCV 92.3 08/30/2020   PLT 203 08/30/2020      Chemistry      Component Value Date/Time   NA 140 08/30/2020 1213   K 3.7 08/30/2020 1213   CL 104 08/30/2020 1213   CO2 26 08/30/2020 1213   BUN 16 08/30/2020 1213   CREATININE 0.96 08/30/2020 1213      Component Value Date/Time   CALCIUM 9.5 08/30/2020 1213   ALKPHOS 87 08/30/2020 1213   AST 18 08/30/2020 1213   ALT 13 08/30/2020 1213   BILITOT 0.4 08/30/2020 1213       RADIOGRAPHIC STUDIES: I have reviewed multiple imaging studies with the patient and her daughter I have personally reviewed the radiological images as listed and agreed with the findings in the report. CT CHEST ABDOMEN PELVIS W CONTRAST  Result Date: 08/29/2020 CLINICAL DATA:  High-grade carcinoma with bulky pelvic mass and omental metastasis. EXAM: CT CHEST, ABDOMEN, AND PELVIS WITH CONTRAST  TECHNIQUE: Multidetector CT imaging of the chest, abdomen and pelvis was performed following the standard protocol during bolus administration of intravenous contrast. CONTRAST:  141mL OMNIPAQUE IOHEXOL 300 MG/ML  SOLN COMPARISON:  None. FINDINGS: CT CHEST FINDINGS Cardiovascular: Resolution of the proximal pulmonary emboli demonstrated in the main pulmonary arteries on comparison exam. No pericardial fluid. Heart is normal. Mediastinum/Nodes: No axillary supraclavicular adenopathy no mediastinal adenopathy. Large hiatal hernia present. Lungs/Pleura: No suspicious pulmonary nodules Musculoskeletal: Degenerative spurring of the spine. No aggressive osseous lesion CT ABDOMEN AND PELVIS FINDINGS Hepatobiliary: No  focal hepatic lesion. No biliary ductal dilatation. Gallbladder is normal. Common bile duct is normal. Pancreas: Pancreas is normal. No ductal dilatation. No pancreatic inflammation. Spleen: Normal spleen Adrenals/urinary tract: Adrenal glands and kidneys are normal. The ureters and bladder normal. Stomach/Bowel: Stomach, small-bowel and cecum are normal. The appendix is not identified but there is no pericecal inflammation to suggest appendicitis. The colon and rectosigmoid colon are normal. Vascular/Lymphatic: Abdominal aorta normal caliber. No retroperitoneal adenopathy. Reproductive: Marked reduction in the bulky pelvic mass now measuring 7.4 x 3.4 cm (image 91/series 2) decreased from 13 cm by 8.7 cm. Remaining nodularity resides along the uterine fundus. Ovaries not identified. Other: Marked reduction of the peritoneal nodularity in the LEFT ventral abdominal wall . The nodular portions of the omental thickening are significantly reduced. The cystic portion of the omental metastasis are persistent. For example LEFT mid abdomen ventral cystic collection measuring 5 cm image 73/2 compares to 5.3 cm. The nodular portion previously seen lateral along the cystic portion has since resolved. Likewise more  inferior ventral peritoneal space cystic lesion measuring 6.3 cm (image 82/2) compares to 6.3 cm. The nodule portion along the ventral aspect the cystic mass is significantly decreased. No new nodularity in the peritoneal space. Interval reduction in the free fluid along the RIGHT pericolic gutter liver. Musculoskeletal: No aggressive osseous lesion. IMPRESSION: CT chest: 1. No evidence of thoracic metastasis. 2. Resolution of proximal pulmonary emboli. CT abdomen pelvis: 1. Marked reduction in volume of bulky pelvic mass. Nodularity remains along the uterine fundus. 2. Marked reduction in the nodular portions of the omental thickening. Cystic portions of the omental metastasis remain. 3. Decrease in volume of free fluid in the abdomen pelvis. 4. No new nodularity in the peritoneal space. Electronically Signed   By: Suzy Bouchard M.D.   On: 08/29/2020 14:40

## 2020-08-31 NOTE — Assessment & Plan Note (Signed)
The patient appears somewhat reluctant to take laxative We discussed the importance of regular laxative therapy.

## 2020-08-31 NOTE — Patient Instructions (Signed)
[facility] Discharge Instructions for Patients Receiving Chemotherapy  Today you received the following chemotherapy agents: paclitaxel (TAXOL)/carboplatin   To help prevent nausea and vomiting after your treatment, we encourage you to take your nausea medication as directed.   If you develop nausea and vomiting that is not controlled by your nausea medication, call the clinic.   BELOW ARE SYMPTOMS THAT SHOULD BE REPORTED IMMEDIATELY:  *FEVER GREATER THAN 100.5 F  *CHILLS WITH OR WITHOUT FEVER  NAUSEA AND VOMITING THAT IS NOT CONTROLLED WITH YOUR NAUSEA MEDICATION  *UNUSUAL SHORTNESS OF BREATH  *UNUSUAL BRUISING OR BLEEDING  TENDERNESS IN MOUTH AND THROAT WITH OR WITHOUT PRESENCE OF ULCERS  *URINARY PROBLEMS  *BOWEL PROBLEMS  UNUSUAL RASH Items with * indicate a potential emergency and should be followed up as soon as possible.  Feel free to call the clinic should you have any questions or concerns. The clinic phone number is [phone].  Please show the [place] at check-in to the Emergency Department and triage nurse.

## 2020-08-31 NOTE — Assessment & Plan Note (Signed)
I have reviewed multiple imaging studies with the patient and her daughter She have excellent response to therapy I will review her imaging study with GYN surgeon to see whether she would be a candidate for interval debulking surgery Given her recent weight loss, I will adjust the dose of her chemotherapy according to her most current weight I plan to see her within 2 to 3 weeks after her surgery for further treatment

## 2020-08-31 NOTE — Assessment & Plan Note (Signed)
She had recent weight loss I will adjust the dose of treatment accordingly I recommend the patient increase oral intake as tolerated

## 2020-08-31 NOTE — Telephone Encounter (Signed)
Called Blanch Media (daughter) with appointment to see Dr. Berline Lopes on 09/07/20 at 2:45.  She verbalized understanding and agreement.

## 2020-09-06 ENCOUNTER — Telehealth: Payer: Self-pay

## 2020-09-06 NOTE — Telephone Encounter (Signed)
TC to patient to review meaningful use questions, no answer and voicemail is not set up.

## 2020-09-06 NOTE — Progress Notes (Signed)
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  CT imaging also revealed abdominal mass.  Subsequent biopsy confirmed diagnosis of ovarian cancer   05/21/2020 Procedure   Successful ultrasound guided diagnostic and therapeutic left thoracentesis yielding 580 cc of pleural fluid.   05/21/2020 Imaging   1. Large saddle pulmonary embolus with CT evidence of right heart strain (RV/LV ratio of 1.6) consistent with at least submassive (intermediate risk) PE. The presence of right heart strain has been associated with an increased risk of morbidity and mortality. 2. Large left and small right pleural effusions. 3. Large amount of peritoneal nodularity, consistent with peritoneal carcinomatosis. 4. Large midline pelvic mass, measuring up to 12.9 x 8.7 x 9.7 cm. 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   05/21/2020 Pathology Results   Clinical History: None provided  Specimen Submitted:  A. PLEURAL FLUID,LEFT, THORACENTESIS:    FINAL MICROSCOPIC DIAGNOSIS:  - Reactive mesothelial cells present   05/24/2020 Procedure   1. Successful placement of a right internal jugular approach power injectable Port-A-Cath. The catheter is ready for immediate use. 2. Successful ultrasound-guided paracentesis yielding 2.2 L of serous, slightly blood tinged ascitic fluid.   05/24/2020 Pathology Results   A. OMENTUM, NEEDLE CORE BIOPSY:  -  High-grade carcinoma  -  See comment   COMMENT:   By immunohistochemistry, the neoplastic cells are positive for cytokeratin 7 and PAX 8 but negative for  cytokeratin 20, CDX2 and TTF-1. 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    05/30/2020 Cancer Staging   Staging form: Ovary, Fallopian Tube, and Primary Peritoneal Carcinoma, AJCC 8th Edition - Clinical stage from 05/30/2020: FIGO Stage IIIC (cT3, cN0, cM0) - Signed by [name], MD on 05/30/2020   06/10/2020 Tumor Marker   Patient's tumor was tested for the following markers: CA-125. Results of the tumor marker test revealed 27,432.   06/14/2020 -  Chemotherapy   The patient had carboplatin and taxol for chemotherapy treatment.     07/04/2020 Tumor Marker   Patient's tumor was tested for the following markers: CA-125 Results of the tumor marker test revealed [id number]   08/29/2020 Imaging   CT chest:   1. No evidence of thoracic metastasis. 2. Resolution of proximal pulmonary emboli.   CT abdomen pelvis:   1. Marked reduction in volume of bulky pelvic mass. Nodularity remains along the uterine fundus. 2. Marked reduction in the nodular portions of the omental thickening. Cystic portions of the omental metastasis remain. 3. Decrease in volume of free fluid in the abdomen pelvis. 4. No new nodularity in the peritoneal space.     08/30/2020 Tumor Marker   Patient's tumor was tested for the following markers: CA-125 Results of the tumor marker test revealed 359     Interval History: Patient presents today after cycle 4 neoadjuvant chemotherapy, which she had on the first.  She has had some difficulty with treatment including pancytopenia and weight loss.  Her weight seems to have stabilized over the last month or so.  Her Ca1 25 has responded nicely to treatment  although has not normalized.  There is been significant reduction in tumor burden based on recent CT scan.  Today, the patient reports overall doing well.  She has had some nausea with treatment but this has been better the last couple of cycles.  Her  appetite has been decreased since her diagnosis and her daughter is working on pushing nutritional supplements and higher calorie food.  Patient reports normal bowel function with regular Metamucil use.  She denies any urinary symptoms.  Her breathing has improved but she still has some shortness of breath with movement.  She uses an inhaler during the day up to 3-4 times to help with her symptoms.  She continues to have some mid abdominal and lower pelvic pain, worse when she goes from sitting to standing.  Past Medical/Surgical History: Past Medical History:  Diagnosis Date  . Anxiety   . Arthritis    "all over my body" (05/27/2017)  . Chronic lower back pain   . Depression   . Dyspnea    allergy related SOB  . Frequent UTI   . GERD (gastroesophageal reflux disease)   . Headache    "monthly" (05/27/2017)  . History of blood transfusion    "when I had my neck fusion"  . History of hiatal hernia   . Hypercholesteremia   . Hypertension   . Hypothyroid   . Pneumonia 2000s X 1; 05/27/2017  . Seasonal allergies   . Type II diabetes mellitus ([place])     Past Surgical History:  Procedure Laterality Date  . ANKLE ARTHROSCOPY Right 03/29/2020   Procedure: RIGHT ANKLE ARTHROSCOPY AND DEBRIDEMENT;  Surgeon: Newt Minion, MD;  Location: [facility];  Service: Orthopedics;  Laterality: Right;  . BACK SURGERY    . BREAST BIOPSY Left   . CATARACT EXTRACTION W/ INTRAOCULAR LENS  IMPLANT, BILATERAL Bilateral   . COLONOSCOPY  10/25/2005   normal  . DILATION AND CURETTAGE OF UTERUS    . IR IMAGING GUIDED PORT INSERTION  05/24/2020  . IR PARACENTESIS  05/24/2020  . PARTIAL HYSTERECTOMY  1968  . POSTERIOR CERVICAL FUSION/FORAMINOTOMY     "fused 4,5,6"  . TONSILLECTOMY      Family History  Problem Relation Age of Onset  . Alzheimer's disease Mother   . Heart attack Father   . Heart disease Father   . Dwarfism Sister   . Colon cancer Neg Hx   . Colon polyps Neg Hx   .  Esophageal cancer Neg Hx   . Rectal cancer Neg Hx   . Stomach cancer Neg Hx     Social History   Socioeconomic History  . Marital status: Divorced    Spouse name: Not on file  . Number of children: Not on file  . Years of education: Not on file  . Highest education level: Not on file  Occupational History  . Not on file  Tobacco Use  . Smoking status: Former Smoker    Packs/day: 1.00    Years: 6.00    Pack years: 6.00    Types: Cigarettes    Quit date: 10/01/1964    Years since quitting: 55.9  . Smokeless tobacco: Never Used  Vaping Use  . Vaping Use: Never used  Substance and Sexual Activity  . Alcohol use: No  . Drug use: No  . Sexual activity: Not Currently  Other Topics Concern  . Not on file  Social History Narrative  . Not on file   Social Determinants of  Health   Financial Resource Strain:   . Difficulty of Paying Living Expenses: Not on file  Food Insecurity:   . Worried About Charity fundraiser in the Last Year: Not on file  . Ran Out of Food in the Last Year: Not on file  Transportation Needs:   . Lack of Transportation (Medical): Not on file  . Lack of Transportation (Non-Medical): Not on file  Physical Activity:   . Days of Exercise per Week: Not on file  . Minutes of Exercise per Session: Not on file  Stress:   . Feeling of Stress : Not on file  Social Connections:   . Frequency of Communication with Friends and Family: Not on file  . Frequency of Social Gatherings with Friends and Family: Not on file  . Attends Religious Services: Not on file  . Active Member of Clubs or Organizations: Not on file  . Attends Archivist Meetings: Not on file  . Marital Status: Not on file    Current Medications:  Current Outpatient Medications:  .  acetaminophen (TYLENOL) 325 MG tablet, Take 650 mg by mouth every 6 (six) hours as needed for mild pain., Disp: , Rfl:  .  albuterol (PROVENTIL HFA;VENTOLIN HFA) 108 (90 BASE) MCG/ACT inhaler, Inhale 2  puffs into the lungs every 6 (six) hours as needed. For wheezing., Disp: , Rfl:  .  apixaban (ELIQUIS) 2.5 MG TABS tablet, Take 1 tablet (2.5 mg total) by mouth 2 (two) times daily., Disp: 60 tablet, Rfl: 11 .  dexamethasone (DECADRON) 4 MG tablet, Take 2 tabs at the night before chemotherapy, every 3 weeks, by mouth x 6 cycles, Disp: 12 tablet, Rfl: 0 .  empagliflozin (JARDIANCE) 10 MG TABS tablet, Take 10 mg by mouth daily., Disp: , Rfl:  .  furosemide (LASIX) 40 MG tablet, Take 40 mg by mouth daily. , Disp: , Rfl: 11 .  glimepiride (AMARYL) 1 MG tablet, Take 1 mg by mouth daily. , Disp: , Rfl:  .  levothyroxine (SYNTHROID) 88 MCG tablet, Take 88 mcg by mouth daily before breakfast. , Disp: , Rfl:  .  lidocaine-prilocaine (EMLA) cream, Apply 1 application topically daily as needed., Disp: 30 g, Rfl: 11 .  LINZESS 72 MCG capsule, Take 72 mcg by mouth every morning., Disp: , Rfl:  .  omeprazole (PRILOSEC) 20 MG capsule, Take 20 mg by mouth daily., Disp: , Rfl:  .  ondansetron (ZOFRAN) 8 MG tablet, Take 1 tablet (8 mg total) by mouth every 8 (eight) hours as needed for nausea., Disp: 30 tablet, Rfl: 3 .  prochlorperazine (COMPAZINE) 10 MG tablet, Take 1 tablet (10 mg total) by mouth every 6 (six) hours as needed for nausea or vomiting., Disp: 30 tablet, Rfl: 1 .  sertraline (ZOLOFT) 50 MG tablet, Take 50 mg by mouth daily., Disp: , Rfl: 0 .  simvastatin (ZOCOR) 40 MG tablet, Take 40 mg by mouth daily., Disp: , Rfl: 3 .  TRADJENTA 5 MG TABS tablet, Take 5 mg by mouth daily., Disp: , Rfl: 3 .  senna-docusate (SENOKOT-S) 8.6-50 MG tablet, Take 2 tablets by mouth at bedtime. For AFTER surgery, do not take if having diarrhea, Disp: 30 tablet, Rfl: 0 .  traMADol (ULTRAM) 50 MG tablet, Take 1 tablet (50 mg total) by mouth every 6 (six) hours as needed for severe pain. For AFTER surgery only, do not take and drive, Disp: 10 tablet, Rfl: 0  Review of Systems: + Fatigue, weight loss, hearing loss,  cough,  shortness of breath, wheezing, abdominal pain, constipation, early satiety, pelvic pain, intermittent headaches, bruising/bleeding easily, anxiety, depression, decreased concentration. Denies appetite changes, fevers, chills. Denies neck lumps or masses, mouth sores, ringing in ears or voice changes. Denies chest pain or palpitations. Denies leg swelling. Denies blood in stools, diarrhea, vomiting. Denies pain with intercourse, dysuria, frequency, hematuria or incontinence. Denies hot flashes, vaginal bleeding or vaginal discharge.   Denies joint pain, back pain or muscle pain/cramps. Denies itching, rash, or wounds. Denies dizziness, numbness or seizures. Denies swollen lymph nodes or glands. Denies confusion.  Physical Exam: BP 124/81 (BP Location: Left Arm, Patient Position: Sitting)   Pulse 74   Temp 97.8 F (36.6 C) (Tympanic)   Resp 16   Wt 108 lb 6.4 oz (49.2 kg)   SpO2 99%   BMI 21.17 kg/m  General: Alert, oriented, no acute distress. HEENT: Normocephalic, atraumatic, sclera anicteric. Chest: Unlabored breathing on room air. Cardiovascular: Regular rate and rhythm, no murmurs. Abdomen: soft, moderate tenderness in the lower abdomen with deeper palpation.  There is an approximately 4-6 cm mass noted in the mid left lateral abdomen suspected to be persistent omental caking.  Normoactive bowel sounds.  No hepatosplenomegaly appreciated.   Extremities: Grossly normal range of motion.  Warm, well perfused.  Trace edema bilaterally to the knees. Skin: No rashes or lesions noted. Lymphatics: No cervical, supraclavicular, or inguinal adenopathy. GU: Atrophic appearing external genitalia without excoriation, or lesions.  Bimanual exam and rectal exam poorly tolerated by the patient due to pain.  No cervix palpated.  Some nodularity and fullness noted within the cul-de-sac.  Laboratory & Radiologic Studies: Antigen (CA) 125 0.0 - 38.1 U/mL 359.0High  41,740.0High CM   81,448.1EHUD CM  14,970.2OVZC    CT C/A/P on 11/29: IMPRESSION: CT chest:  1. No evidence of thoracic metastasis. 2. Resolution of proximal pulmonary emboli.  CT abdomen pelvis:  1. Marked reduction in volume of bulky pelvic mass. Nodularity remains along the uterine fundus. 2. Marked reduction in the nodular portions of the omental thickening. Cystic portions of the omental metastasis remain. 3. Decrease in volume of free fluid in the abdomen pelvis. 4. No new nodularity in the peritoneal space.  Assessment & Plan: Amber Gray is a 79 y.o. 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  The patient has had significant improvement in disease on imaging and reduction of her CA-125, although this has not normalized.  Based on my review of her imaging, I think it is possible to achieve an optimal resection at this time.  Additionally, her most recent imaging shows resolution completely of her pulmonary embolism.  Given the volume of tumor that is still present, I recommend proceeding with laparotomy.  Plan will be for bilateral salpingo-oophorectomy, omentectomy, and tumor debulking.  While I think the risk is low, we discussed the potential need for bowel surgery to achieve a complete resection.  I stressed the importance of maintaining her weight, improving caloric and protein consumption, and good glycemic control from now until surgery.  Surgery is tentatively scheduled on Tuesday, January 4.  We discussed likely a 2-3 night stay in the hospital.  We will plan to hold her Eliquis for 2-3 days before surgery and restart it as soon as safe after surgery.  We discussed the risks with surgery that include but are not limited to bleeding, need for blood transfusion, injury  to surrounding structures requiring repair (including bowel, bladder, blood vessels,  nerves), DVT and PE, medical complications such as stroke, cardiac event, and rarely death.  Perioperative instructions were reviewed with the patient and postoperative prescription sent to her pharmacy.  The patient and her daughter had the opportunity to have all of their questions answered.  45 minutes of total time was spent for this patient encounter, including preparation, face-to-face counseling with the patient and coordination of care, and documentation of the encounter.  Jeral Pinch, MD  Division of Gynecologic Oncology  Department of Obstetrics and Gynecology  [facility]

## 2020-09-06 NOTE — H&P (View-Only) (Signed)
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  CT imaging also revealed abdominal mass.  Subsequent biopsy confirmed diagnosis of ovarian cancer   05/21/2020 Procedure   Successful ultrasound guided diagnostic and therapeutic left thoracentesis yielding 580 cc of pleural fluid.   05/21/2020 Imaging   1. Large saddle pulmonary embolus with CT evidence of right heart strain (RV/LV ratio of 1.6) consistent with at least submassive (intermediate risk) PE. The presence of right heart strain has been associated with an increased risk of morbidity and mortality. 2. Large left and small right pleural effusions. 3. Large amount of peritoneal nodularity, consistent with peritoneal carcinomatosis. 4. Large midline pelvic mass, measuring up to 12.9 x 8.7 x 9.7 cm. 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   05/21/2020 Pathology Results   Clinical History: None provided  Specimen Submitted:  A. PLEURAL FLUID,LEFT, THORACENTESIS:    FINAL MICROSCOPIC DIAGNOSIS:  - Reactive mesothelial cells present   05/24/2020 Procedure   1. Successful placement of a right internal jugular approach power injectable Port-A-Cath. The catheter is ready for immediate use. 2. Successful ultrasound-guided paracentesis yielding 2.2 L of serous, slightly blood tinged ascitic fluid.   05/24/2020 Pathology Results   A. OMENTUM, NEEDLE CORE BIOPSY:  -  High-grade carcinoma  -  See comment   COMMENT:   By immunohistochemistry, the neoplastic cells are positive for cytokeratin 7 and PAX 8 but negative for  cytokeratin 20, CDX2 and TTF-1. 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    05/30/2020 Cancer Staging   Staging form: Ovary, Fallopian Tube, and Primary Peritoneal Carcinoma, AJCC 8th Edition - Clinical stage from 05/30/2020: FIGO Stage IIIC (cT3, cN0, cM0) - Signed by [name], MD on 05/30/2020   06/10/2020 Tumor Marker   Patient's tumor was tested for the following markers: CA-125. Results of the tumor marker test revealed 27,432.   06/14/2020 -  Chemotherapy   The patient had carboplatin and taxol for chemotherapy treatment.     07/04/2020 Tumor Marker   Patient's tumor was tested for the following markers: CA-125 Results of the tumor marker test revealed [id number]   08/29/2020 Imaging   CT chest:   1. No evidence of thoracic metastasis. 2. Resolution of proximal pulmonary emboli.   CT abdomen pelvis:   1. Marked reduction in volume of bulky pelvic mass. Nodularity remains along the uterine fundus. 2. Marked reduction in the nodular portions of the omental thickening. Cystic portions of the omental metastasis remain. 3. Decrease in volume of free fluid in the abdomen pelvis. 4. No new nodularity in the peritoneal space.     08/30/2020 Tumor Marker   Patient's tumor was tested for the following markers: CA-125 Results of the tumor marker test revealed 359     Interval History: Patient presents today after cycle 4 neoadjuvant chemotherapy, which she had on the first.  She has had some difficulty with treatment including pancytopenia and weight loss.  Her weight seems to have stabilized over the last month or so.  Her Ca1 25 has responded nicely to treatment  although has not normalized.  There is been significant reduction in tumor burden based on recent CT scan.  Today, the patient reports overall doing well.  She has had some nausea with treatment but this has been better the last couple of cycles.  Her  appetite has been decreased since her diagnosis and her daughter is working on pushing nutritional supplements and higher calorie food.  Patient reports normal bowel function with regular Metamucil use.  She denies any urinary symptoms.  Her breathing has improved but she still has some shortness of breath with movement.  She uses an inhaler during the day up to 3-4 times to help with her symptoms.  She continues to have some mid abdominal and lower pelvic pain, worse when she goes from sitting to standing.  Past Medical/Surgical History: Past Medical History:  Diagnosis Date  . Anxiety   . Arthritis    "all over my body" (05/27/2017)  . Chronic lower back pain   . Depression   . Dyspnea    allergy related SOB  . Frequent UTI   . GERD (gastroesophageal reflux disease)   . Headache    "monthly" (05/27/2017)  . History of blood transfusion    "when I had my neck fusion"  . History of hiatal hernia   . Hypercholesteremia   . Hypertension   . Hypothyroid   . Pneumonia 2000s X 1; 05/27/2017  . Seasonal allergies   . Type II diabetes mellitus ([place])     Past Surgical History:  Procedure Laterality Date  . ANKLE ARTHROSCOPY Right 03/29/2020   Procedure: RIGHT ANKLE ARTHROSCOPY AND DEBRIDEMENT;  Surgeon: Newt Minion, MD;  Location: [facility];  Service: Orthopedics;  Laterality: Right;  . BACK SURGERY    . BREAST BIOPSY Left   . CATARACT EXTRACTION W/ INTRAOCULAR LENS  IMPLANT, BILATERAL Bilateral   . COLONOSCOPY  10/25/2005   normal  . DILATION AND CURETTAGE OF UTERUS    . IR IMAGING GUIDED PORT INSERTION  05/24/2020  . IR PARACENTESIS  05/24/2020  . PARTIAL HYSTERECTOMY  1968  . POSTERIOR CERVICAL FUSION/FORAMINOTOMY     "fused 4,5,6"  . TONSILLECTOMY      Family History  Problem Relation Age of Onset  . Alzheimer's disease Mother   . Heart attack Father   . Heart disease Father   . Dwarfism Sister   . Colon cancer Neg Hx   . Colon polyps Neg Hx   .  Esophageal cancer Neg Hx   . Rectal cancer Neg Hx   . Stomach cancer Neg Hx     Social History   Socioeconomic History  . Marital status: Divorced    Spouse name: Not on file  . Number of children: Not on file  . Years of education: Not on file  . Highest education level: Not on file  Occupational History  . Not on file  Tobacco Use  . Smoking status: Former Smoker    Packs/day: 1.00    Years: 6.00    Pack years: 6.00    Types: Cigarettes    Quit date: 10/01/1964    Years since quitting: 55.9  . Smokeless tobacco: Never Used  Vaping Use  . Vaping Use: Never used  Substance and Sexual Activity  . Alcohol use: No  . Drug use: No  . Sexual activity: Not Currently  Other Topics Concern  . Not on file  Social History Narrative  . Not on file   Social Determinants of  Health   Financial Resource Strain:   . Difficulty of Paying Living Expenses: Not on file  Food Insecurity:   . Worried About Charity fundraiser in the Last Year: Not on file  . Ran Out of Food in the Last Year: Not on file  Transportation Needs:   . Lack of Transportation (Medical): Not on file  . Lack of Transportation (Non-Medical): Not on file  Physical Activity:   . Days of Exercise per Week: Not on file  . Minutes of Exercise per Session: Not on file  Stress:   . Feeling of Stress : Not on file  Social Connections:   . Frequency of Communication with Friends and Family: Not on file  . Frequency of Social Gatherings with Friends and Family: Not on file  . Attends Religious Services: Not on file  . Active Member of Clubs or Organizations: Not on file  . Attends Archivist Meetings: Not on file  . Marital Status: Not on file    Current Medications:  Current Outpatient Medications:  .  acetaminophen (TYLENOL) 325 MG tablet, Take 650 mg by mouth every 6 (six) hours as needed for mild pain., Disp: , Rfl:  .  albuterol (PROVENTIL HFA;VENTOLIN HFA) 108 (90 BASE) MCG/ACT inhaler, Inhale 2  puffs into the lungs every 6 (six) hours as needed. For wheezing., Disp: , Rfl:  .  apixaban (ELIQUIS) 2.5 MG TABS tablet, Take 1 tablet (2.5 mg total) by mouth 2 (two) times daily., Disp: 60 tablet, Rfl: 11 .  dexamethasone (DECADRON) 4 MG tablet, Take 2 tabs at the night before chemotherapy, every 3 weeks, by mouth x 6 cycles, Disp: 12 tablet, Rfl: 0 .  empagliflozin (JARDIANCE) 10 MG TABS tablet, Take 10 mg by mouth daily., Disp: , Rfl:  .  furosemide (LASIX) 40 MG tablet, Take 40 mg by mouth daily. , Disp: , Rfl: 11 .  glimepiride (AMARYL) 1 MG tablet, Take 1 mg by mouth daily. , Disp: , Rfl:  .  levothyroxine (SYNTHROID) 88 MCG tablet, Take 88 mcg by mouth daily before breakfast. , Disp: , Rfl:  .  lidocaine-prilocaine (EMLA) cream, Apply 1 application topically daily as needed., Disp: 30 g, Rfl: 11 .  LINZESS 72 MCG capsule, Take 72 mcg by mouth every morning., Disp: , Rfl:  .  omeprazole (PRILOSEC) 20 MG capsule, Take 20 mg by mouth daily., Disp: , Rfl:  .  ondansetron (ZOFRAN) 8 MG tablet, Take 1 tablet (8 mg total) by mouth every 8 (eight) hours as needed for nausea., Disp: 30 tablet, Rfl: 3 .  prochlorperazine (COMPAZINE) 10 MG tablet, Take 1 tablet (10 mg total) by mouth every 6 (six) hours as needed for nausea or vomiting., Disp: 30 tablet, Rfl: 1 .  sertraline (ZOLOFT) 50 MG tablet, Take 50 mg by mouth daily., Disp: , Rfl: 0 .  simvastatin (ZOCOR) 40 MG tablet, Take 40 mg by mouth daily., Disp: , Rfl: 3 .  TRADJENTA 5 MG TABS tablet, Take 5 mg by mouth daily., Disp: , Rfl: 3 .  senna-docusate (SENOKOT-S) 8.6-50 MG tablet, Take 2 tablets by mouth at bedtime. For AFTER surgery, do not take if having diarrhea, Disp: 30 tablet, Rfl: 0 .  traMADol (ULTRAM) 50 MG tablet, Take 1 tablet (50 mg total) by mouth every 6 (six) hours as needed for severe pain. For AFTER surgery only, do not take and drive, Disp: 10 tablet, Rfl: 0  Review of Systems: + Fatigue, weight loss, hearing loss,  cough,  shortness of breath, wheezing, abdominal pain, constipation, early satiety, pelvic pain, intermittent headaches, bruising/bleeding easily, anxiety, depression, decreased concentration. Denies appetite changes, fevers, chills. Denies neck lumps or masses, mouth sores, ringing in ears or voice changes. Denies chest pain or palpitations. Denies leg swelling. Denies blood in stools, diarrhea, vomiting. Denies pain with intercourse, dysuria, frequency, hematuria or incontinence. Denies hot flashes, vaginal bleeding or vaginal discharge.   Denies joint pain, back pain or muscle pain/cramps. Denies itching, rash, or wounds. Denies dizziness, numbness or seizures. Denies swollen lymph nodes or glands. Denies confusion.  Physical Exam: BP 124/81 (BP Location: Left Arm, Patient Position: Sitting)   Pulse 74   Temp 97.8 F (36.6 C) (Tympanic)   Resp 16   Wt 108 lb 6.4 oz (49.2 kg)   SpO2 99%   BMI 21.17 kg/m  General: Alert, oriented, no acute distress. HEENT: Normocephalic, atraumatic, sclera anicteric. Chest: Unlabored breathing on room air. Cardiovascular: Regular rate and rhythm, no murmurs. Abdomen: soft, moderate tenderness in the lower abdomen with deeper palpation.  There is an approximately 4-6 cm mass noted in the mid left lateral abdomen suspected to be persistent omental caking.  Normoactive bowel sounds.  No hepatosplenomegaly appreciated.   Extremities: Grossly normal range of motion.  Warm, well perfused.  Trace edema bilaterally to the knees. Skin: No rashes or lesions noted. Lymphatics: No cervical, supraclavicular, or inguinal adenopathy. GU: Atrophic appearing external genitalia without excoriation, or lesions.  Bimanual exam and rectal exam poorly tolerated by the patient due to pain.  No cervix palpated.  Some nodularity and fullness noted within the cul-de-sac.  Laboratory & Radiologic Studies: Antigen (CA) 125 0.0 - 38.1 U/mL 359.0High  97,353.0High CM   29,924.2ASTM CM  19,622.2LNLG    CT C/A/P on 11/29: IMPRESSION: CT chest:  1. No evidence of thoracic metastasis. 2. Resolution of proximal pulmonary emboli.  CT abdomen pelvis:  1. Marked reduction in volume of bulky pelvic mass. Nodularity remains along the uterine fundus. 2. Marked reduction in the nodular portions of the omental thickening. Cystic portions of the omental metastasis remain. 3. Decrease in volume of free fluid in the abdomen pelvis. 4. No new nodularity in the peritoneal space.  Assessment & Plan: Amber Gray is a 79 y.o. 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  The patient has had significant improvement in disease on imaging and reduction of her CA-125, although this has not normalized.  Based on my review of her imaging, I think it is possible to achieve an optimal resection at this time.  Additionally, her most recent imaging shows resolution completely of her pulmonary embolism.  Given the volume of tumor that is still present, I recommend proceeding with laparotomy.  Plan will be for bilateral salpingo-oophorectomy, omentectomy, and tumor debulking.  While I think the risk is low, we discussed the potential need for bowel surgery to achieve a complete resection.  I stressed the importance of maintaining her weight, improving caloric and protein consumption, and good glycemic control from now until surgery.  Surgery is tentatively scheduled on Tuesday, January 4.  We discussed likely a 2-3 night stay in the hospital.  We will plan to hold her Eliquis for 2-3 days before surgery and restart it as soon as safe after surgery.  We discussed the risks with surgery that include but are not limited to bleeding, need for blood transfusion, injury  to surrounding structures requiring repair (including bowel, bladder, blood vessels,  nerves), DVT and PE, medical complications such as stroke, cardiac event, and rarely death.  Perioperative instructions were reviewed with the patient and postoperative prescription sent to her pharmacy.  The patient and her daughter had the opportunity to have all of their questions answered.  45 minutes of total time was spent for this patient encounter, including preparation, face-to-face counseling with the patient and coordination of care, and documentation of the encounter.  Jeral Pinch, MD  Division of Gynecologic Oncology  Department of Obstetrics and Gynecology  [facility]

## 2020-09-07 ENCOUNTER — Other Ambulatory Visit: Payer: Self-pay

## 2020-09-07 ENCOUNTER — Inpatient Hospital Stay (HOSPITAL_BASED_OUTPATIENT_CLINIC_OR_DEPARTMENT_OTHER): Payer: Medicare Other | Admitting: Gynecologic Oncology

## 2020-09-07 ENCOUNTER — Encounter: Payer: Self-pay | Admitting: Gynecologic Oncology

## 2020-09-07 VITALS — BP 124/81 | HR 74 | Temp 97.8°F | Resp 16 | Wt 108.4 lb

## 2020-09-07 DIAGNOSIS — Z5111 Encounter for antineoplastic chemotherapy: Secondary | ICD-10-CM | POA: Diagnosis not present

## 2020-09-07 DIAGNOSIS — C786 Secondary malignant neoplasm of retroperitoneum and peritoneum: Secondary | ICD-10-CM | POA: Diagnosis not present

## 2020-09-07 DIAGNOSIS — C569 Malignant neoplasm of unspecified ovary: Secondary | ICD-10-CM | POA: Diagnosis not present

## 2020-09-07 DIAGNOSIS — C7989 Secondary malignant neoplasm of other specified sites: Secondary | ICD-10-CM | POA: Diagnosis not present

## 2020-09-07 MED ORDER — SENNOSIDES-DOCUSATE SODIUM 8.6-50 MG PO TABS: 2.0000 | ORAL_TABLET | Freq: Every day | ORAL | 0 refills | Status: DC

## 2020-09-07 MED ORDER — TRAMADOL HCL 50 MG PO TABS: 50.0000 mg | ORAL_TABLET | Freq: Four times a day (QID) | ORAL | 0 refills | Status: DC | PRN

## 2020-09-07 NOTE — Patient Instructions (Addendum)
Preparing for your Surgery  Plan for surgery on October 04, 2020 with Dr. Jeral Pinch at [facility] will be scheduled for a open bilateral salpingo-oophorectomy (removal of both ovaries and fallopian tubes), omentectomy, tumor debulking.  YOUR LAST DOSE OF ELIQUIS WILL NEED TO BE THE MORNING OF October 01, 2020 (Saturday).   Pre-operative Testing -You will receive a phone call from presurgical testing at [facility] to arrange for a pre-operative appointment, lab appointment, and COVID test. The COVID test normally happens 3 days prior to the surgery and they ask that you self quarantine after the test up until surgery to decrease chance of exposure.  -Bring your insurance card, copy of an advanced directive if applicable, medication list  -At that visit, you will be asked to sign a consent for a possible blood transfusion in case a transfusion becomes necessary during surgery.  The need for a blood transfusion is rare but having consent is a necessary part of your care.    -Do not take supplements such as fish oil (omega 3), red yeast rice, turmeric before your surgery.   Day Before Surgery at [facility] will be asked to take in a light diet the day before surgery. You will be advised you can have clear liquids up until 3 hours before your surgery.    Eat a light diet the day before surgery.  Examples including soups, broths, toast, yogurt, mashed potatoes.  AVOID GAS PRODUCING FOODS. Things to avoid include carbonated beverages (fizzy beverages), raw fruits and raw vegetables, or beans.   If your bowels are filled with gas, your surgeon will have difficulty visualizing your pelvic organs which increases your surgical risks.  Your role in recovery Your role is to become active as soon as directed by your doctor, while still giving yourself time to heal.  Rest when you feel tired. You will be asked to do the following in order to speed your recovery:  - Cough and  breathe deeply. This helps to clear and expand your lungs and can prevent pneumonia after surgery.  - [place]. Do mild physical activity. Walking or moving your legs help your circulation and body functions return to normal. Do not try to get up or walk alone the first time after surgery.   -If you develop swelling on one leg or the other, pain in the back of your leg, redness/warmth in one of your legs, please call the office or go to the Emergency Room to have a doppler to rule out a blood clot. For shortness of breath, chest pain-seek care in the Emergency Room as soon as possible. - Actively manage your pain. Managing your pain lets you move in comfort. We will ask you to rate your pain on a scale of zero to 10. It is your responsibility to tell your doctor or nurse where and how much you hurt so your pain can be treated.  Special Considerations -If you are diabetic, you may be placed on insulin after surgery to have closer control over your blood sugars to promote healing and recovery.  This does not mean that you will be discharged on insulin.  If applicable, your oral antidiabetics will be resumed when you are tolerating a solid diet.  -Your final pathology results from surgery should be available around one week after surgery and the results will be relayed to you when available.  -Dr. Lahoma Crocker is the surgeon that assists your GYN Oncologist  with surgery.  If you end up staying the night, the next day after your surgery you will either see Dr. Denman George, Dr. Berline Lopes, or Dr. Lahoma Crocker.  -FMLA forms can be faxed to [phone] and please allow 5-7 business days for completion.  Pain Management After Surgery -You have been prescribed your pain medication and bowel regimen medications before surgery so that you can have these available when you are discharged from the hospital. The pain medication is for use ONLY AFTER surgery and a new prescription will not be  given.   -Make sure that you have Tylenol and Ibuprofen at home to use on a regular basis after surgery for pain control. We recommend alternating the medications every hour to six hours since they work differently and are processed in the body differently for pain relief.  -Review the attached handout on narcotic use and their risks and side effects.   Bowel Regimen -You have been prescribed Sennakot-S to take nightly to prevent constipation especially if you are taking the narcotic pain medication intermittently.  It is important to prevent constipation and drink adequate amounts of liquids. You can stop taking this medication when you are not taking pain medication and you are back on your normal bowel routine.  Risks of Surgery Risks of surgery are low but include bleeding, infection, damage to surrounding structures, re-operation, blood clots, and very rarely death.   Blood Transfusion Information (For the consent to be signed before surgery)  We will be checking your blood type before surgery so in case of emergencies, we will know what type of blood you would need.                                            WHAT IS A BLOOD TRANSFUSION?  A transfusion is the replacement of blood or some of its parts. Blood is made up of multiple cells which provide different functions.  Red blood cells carry oxygen and are used for blood loss replacement.  White blood cells fight against infection.  Platelets control bleeding.  Plasma helps clot blood.  Other blood products are available for specialized needs, such as hemophilia or other clotting disorders. BEFORE THE TRANSFUSION  Who gives blood for transfusions?   You may be able to donate blood to be used at a later date on yourself (autologous donation).  Relatives can be asked to donate blood. This is generally not any safer than if you have received blood from a stranger. The same precautions are taken to ensure safety when a relative's  blood is donated.  Healthy volunteers who are fully evaluated to make sure their blood is safe. This is blood bank blood. Transfusion therapy is the safest it has ever been in the practice of medicine. Before blood is taken from a donor, a complete history is taken to make sure that person has no history of diseases nor engages in risky social behavior (examples are intravenous drug use or sexual activity with multiple partners). The donor's travel history is screened to minimize risk of transmitting infections, such as malaria. The donated blood is tested for signs of infectious diseases, such as HIV and hepatitis. The blood is then tested to be sure it is compatible with you in order to minimize the chance of a transfusion reaction. If you or a relative donates blood, this is often done in anticipation of  surgery and is not appropriate for emergency situations. It takes many days to process the donated blood. RISKS AND COMPLICATIONS Although transfusion therapy is very safe and saves many lives, the main dangers of transfusion include:   Getting an infectious disease.  Developing a transfusion reaction. This is an allergic reaction to something in the blood you were given. Every precaution is taken to prevent this. The decision to have a blood transfusion has been considered carefully by your caregiver before blood is given. Blood is not given unless the benefits outweigh the risks.  AFTER SURGERY INSTRUCTIONS  Return to work: 4 weeks if applicable  Activity: 1. Be up and out of the bed during the day.  Take a nap if needed.  You may walk up steps but be careful and use the hand rail.  Stair climbing will tire you more than you think, you may need to stop part way and rest.   2. No lifting or straining for 6 weeks over 10 pounds. No pushing, pulling, straining for 6 weeks.  3. No driving for 1 week(s).  Do not drive if you are taking narcotic pain medicine and make sure that your reaction time  has returned.   4. You can shower as soon as the next day after surgery. Shower daily.  Use your regular soap and water (not directly on the incision) and pat your incision(s) dry afterwards; don't rub.  No tub baths or submerging your body in water until cleared by your surgeon. If you have the soap that was given to you by pre-surgical testing that was used before surgery, you do not need to use it afterwards because this can irritate your incisions.   5. No sexual activity and nothing in the vagina for 6 weeks.  6. You may experience a small amount of clear drainage from your incisions, which is normal.  If the drainage persists, increases, or changes color please call the office.  7. Do not use creams, lotions, or ointments such as neosporin on your incisions after surgery until advised by your surgeon because they can cause removal of the dermabond glue on your incisions.    8. Take Tylenol or ibuprofen first for pain and only use narcotic pain medication for severe pain not relieved by the Tylenol or Ibuprofen.  Monitor your Tylenol intake to a max of 4,000 mg in a 24 hour period. You can alternate these medications after surgery.  Diet: 1. Low sodium Heart Healthy Diet is recommended but you are cleared to resume your normal (before surgery) diet after your procedure.  2. It is safe to use a laxative, such as Miralax or Colace, if you have difficulty moving your bowels. You have been prescribed Sennakot at bedtime every evening to keep bowel movements regular and to prevent constipation.    Wound Care: 1. Keep clean and dry.  Shower daily.  Reasons to call the Doctor:  Fever - Oral temperature greater than 100.4 degrees Fahrenheit  Foul-smelling vaginal discharge  Difficulty urinating  Nausea and vomiting  Increased pain at the site of the incision that is unrelieved with pain medicine.  Difficulty breathing with or without chest pain  New calf pain especially if only on one  side  Sudden, continuing increased vaginal bleeding with or without clots.   Contacts: For questions or concerns you should contact:  Dr. Jeral Pinch at [phone]  Joylene John, NP at [phone]  After Hours: call [phone] and have the GYN Oncologist paged/contacted (after 5  pm or on the weekends)

## 2020-09-08 ENCOUNTER — Other Ambulatory Visit: Payer: Self-pay | Admitting: Gynecologic Oncology

## 2020-09-08 DIAGNOSIS — C569 Malignant neoplasm of unspecified ovary: Secondary | ICD-10-CM

## 2020-09-21 NOTE — Patient Instructions (Addendum)
DUE TO COVID-19 ONLY ONE VISITOR IS ALLOWED TO COME WITH YOU AND STAY IN THE WAITING ROOM ONLY DURING PRE OP AND PROCEDURE DAY OF SURGERY. THE 1 VISITOR  MAY VISIT WITH YOU AFTER SURGERY IN YOUR PRIVATE ROOM DURING VISITING HOURS ONLY!  YOU NEED TO HAVE A COVID 19 TEST ON_12/30______ @_1 :15 pm______, THIS TEST MUST BE DONE BEFORE SURGERY,  COVID TESTING SITE [place] [id number], IT IS ON THE RIGHT GOING OUT [address] APPROXIMATELY  2 MINUTES PAST [employer] ON THE RIGHT. ONCE YOUR COVID TEST IS COMPLETED,  PLEASE BEGIN THE QUARANTINE INSTRUCTIONS AS OUTLINED IN YOUR HANDOUT.                Amber Gray    Your procedure is scheduled on: 10/05/19   Report to [facility] Main  Entrance   Report to admitting at   9:30 AM     Call this number if you have problems the morning of surgery [phone]    Remember: Do not eat food After Midnight.  YOU MAY HAVE CLEAR LIQUIDS UNTIL 8:30 am   BRUSH YOUR TEETH MORNING OF SURGERY AND RINSE YOUR MOUTH OUT, NO CHEWING GUM CANDY OR MINTS.     Take these medicines the morning of surgery with A SIP OF WATER: Zoloft, Levothyroxine, Omeprazole, use inhaler and bring it with you  DO NOT TAKE ANY DIABETIC MEDICATIONS DAY OF YOUR SURGERY    How to Manage Your Diabetes Before and After Surgery  Why is it important to control my blood sugar before and after surgery? . Improving blood sugar levels before and after surgery helps healing and can limit problems. . A way of improving blood sugar control is eating a healthy diet by: o  Eating less sugar and carbohydrates o  Increasing activity/exercise o  Talking with your doctor about reaching your blood sugar goals . High blood sugars (greater than 180 mg/dL) can raise your risk of infections and slow your recovery, so you will need to focus on controlling your diabetes during the weeks before surgery. . Make sure that the doctor who takes care of your  diabetes knows about your planned surgery including the date and location.  How do I manage my blood sugar before surgery? . Check your blood sugar at least 4 times a day, starting 2 days before surgery, to make sure that the level is not too high or low. o Check your blood sugar the morning of your surgery when you wake up and every 2 hours until you get to the Short Stay unit. . If your blood sugar is less than 70 mg/dL, you will need to treat for low blood sugar: o Do not take insulin. o Treat a low blood sugar (less than 70 mg/dL) with  cup of clear juice (cranberry or apple), 4 glucose tablets, OR glucose gel. o Recheck blood sugar in 15 minutes after treatment (to make sure it is greater than 70 mg/dL). If your blood sugar is not greater than 70 mg/dL on recheck, call [phone] for further instructions. . Report your blood sugar to the short stay nurse when you get to Short Stay.  . If you are admitted to the hospital after surgery: o Your blood sugar will be checked by the staff and you will probably be given insulin after surgery (instead of oral diabetes medicines) to make sure you have good blood sugar levels. o The goal for blood sugar control after  surgery is 80-180 mg/dL.   WHAT DO I DO ABOUT MY DIABETES MEDICATION?  Marland Kitchen Do not take oral diabetes medicines (pills) the morning of surgery. .                             You may not have any metal on your body including hair pins and              piercings  Do not wear jewelry, make-up, lotions, powders or perfumes, deodorant             Do not wear nail polish on your fingernails.  Do not shave  48 hours prior to surgery.     Do not bring valuables to the hospital. [place].  Contacts, dentures or bridgework may not be worn into surgery.       Patients discharged the day of surgery will not be allowed to drive home. IF YOU ARE HAVING SURGERY AND GOING HOME THE SAME DAY,  YOU MUST HAVE AN ADULT TO DRIVE YOU HOME AND BE WITH YOU FOR 24 HOURS. YOU MAY GO HOME BY TAXI OR UBER OR ORTHERWISE, BUT AN ADULT MUST ACCOMPANY YOU HOME AND STAY WITH YOU FOR 24 HOURS.  Name and phone number of your driver:  Special Instructions: N/A              Please read over the following fact sheets you were given: _____________________________________________________________________             [facility] - Preparing for Surgery Before surgery, you can play an important role.  Because skin is not sterile, your skin needs to be as free of germs as possible.  You can reduce the number of germs on your skin by washing with CHG (chlorahexidine gluconate) soap before surgery.  CHG is an antiseptic cleaner which kills germs and bonds with the skin to continue killing germs even after washing. Please DO NOT use if you have an allergy to CHG or antibacterial soaps.  If your skin becomes reddened/irritated stop using the CHG and inform your nurse when you arrive at Short Stay. Do not shave (including legs and underarms) for at least 48 hours prior to the first CHG shower.. Please follow these instructions carefully:  1.  Shower with CHG Soap the night before surgery and the  morning of Surgery.  2.  If you choose to wash your hair, wash your hair first as usual with your  normal  shampoo.  3.  After you shampoo, rinse your hair and body thoroughly to remove the  shampoo.                                        4.  Use CHG as you would any other liquid soap.  You can apply chg directly  to the skin and wash                       Gently with a scrungie or clean washcloth.  5.  Apply the CHG Soap to your body ONLY FROM THE NECK DOWN.   Do not use on face/ open  Wound or open sores. Avoid contact with eyes, ears mouth and genitals (private parts).                       Wash face,  Genitals (private parts) with your normal soap.             6.  Wash thoroughly, paying  special attention to the area where your surgery  will be performed.  7.  Thoroughly rinse your body with warm water from the neck down.  8.  DO NOT shower/wash with your normal soap after using and rinsing off  the CHG Soap.             9.  Pat yourself dry with a clean towel.            10.  Wear clean pajamas.            11.  Place clean sheets on your bed the night of your first shower and do not  sleep with pets. Day of Surgery : Do not apply any lotions/deodorants the morning of surgery.  Please wear clean clothes to the hospital/surgery center.  FAILURE TO FOLLOW THESE INSTRUCTIONS MAY RESULT IN THE CANCELLATION OF YOUR SURGERY PATIENT SIGNATURE_________________________________  NURSE SIGNATURE__________________________________  ________________________________________________________________________

## 2020-09-22 ENCOUNTER — Other Ambulatory Visit: Payer: Self-pay

## 2020-09-22 ENCOUNTER — Encounter (INDEPENDENT_AMBULATORY_CARE_PROVIDER_SITE_OTHER): Payer: Self-pay

## 2020-09-22 ENCOUNTER — Encounter (HOSPITAL_COMMUNITY)
Admission: RE | Admit: 2020-09-22 | Discharge: 2020-09-22 | Disposition: A | Discharge status: Home / Self Care | Payer: Medicare Other | Source: Ambulatory Visit | Attending: Gynecologic Oncology | Admitting: Gynecologic Oncology

## 2020-09-22 ENCOUNTER — Encounter (HOSPITAL_COMMUNITY): Payer: Self-pay

## 2020-09-22 DIAGNOSIS — Z01812 Encounter for preprocedural laboratory examination: Secondary | ICD-10-CM | POA: Insufficient documentation

## 2020-09-22 LAB — TYPE AND SCREEN
ABO/RH(D): A POS
Antibody Screen: NEGATIVE

## 2020-09-22 LAB — URINALYSIS, ROUTINE W REFLEX MICROSCOPIC
Bacteria, UA: NONE SEEN
Bilirubin Urine: NEGATIVE
Glucose, UA: >=500 mg/dL — AB
Hgb urine dipstick: NEGATIVE
Ketones, ur: NEGATIVE mg/dL
Nitrite: NEGATIVE
Protein, ur: NEGATIVE mg/dL
Specific Gravity, Urine: 1.018 (ref 1.005–1.030)
pH: 5 (ref 5.0–8.0)

## 2020-09-22 LAB — CBC
HCT: 31 % — ABNORMAL LOW (ref 36.0–46.0)
Hemoglobin: 10 g/dL — ABNORMAL LOW (ref 12.0–15.0)
MCH: 30.6 pg (ref 26.0–34.0)
MCHC: 32.3 g/dL (ref 30.0–36.0)
MCV: 94.8 fL (ref 80.0–100.0)
Platelets: 200 10*3/uL (ref 150–400)
RBC: 3.27 MIL/uL — ABNORMAL LOW (ref 3.87–5.11)
RDW: 18.6 % — ABNORMAL HIGH (ref 11.5–15.5)
WBC: 6 10*3/uL (ref 4.0–10.5)
nRBC: 0 % (ref 0.0–0.2)

## 2020-09-22 LAB — GLUCOSE, CAPILLARY: Glucose-Capillary: 127 mg/dL — ABNORMAL HIGH (ref 70–99)

## 2020-09-22 LAB — BASIC METABOLIC PANEL
Anion gap: 14 (ref 5–15)
BUN: 29 mg/dL — ABNORMAL HIGH (ref 8–23)
CO2: 23 mmol/L (ref 22–32)
Calcium: 9.3 mg/dL (ref 8.9–10.3)
Chloride: 103 mmol/L (ref 98–111)
Creatinine, Ser: 0.89 mg/dL (ref 0.44–1.00)
GFR, Estimated: >60 mL/min (ref >60)
Glucose, Bld: 150 mg/dL — ABNORMAL HIGH (ref 70–99)
Potassium: 3.4 mmol/L — ABNORMAL LOW (ref 3.5–5.1)
Sodium: 140 mmol/L (ref 135–145)

## 2020-09-22 NOTE — Progress Notes (Addendum)
COVID Vaccine Completed:yes Date COVID Vaccine completed:Nov 2021 COVID vaccine manufacturer: Pfizer     PCP - Dr. Aviva Signs Cardiologist -no   Chest x-ray - 05/21/20-epic EKG - 05/23/20-epic Stress Test - no ECHO - 05/21/20-epic Cardiac Cath - no Pacemaker/ICD device last checked:NA  Sleep Study - no CPAP -   Fasting Blood Sugar -  Checks Blood Sugar _____ times a day  Blood Thinner Instructions:Eliquis/ Russo Aspirin Instructions:cut dose in half and continue taking it/ Virgina Jock Last Dose:  Anesthesia review:   Patient denies shortness of breath, fever, cough and chest pain at PAT appointment yes   Patient verbalized understanding of instructions that were given to them at the PAT appointment. Patient was also instructed that they will need to review over the PAT instructions again at home before surgery. Yes Pt does feel SOB doing most activities after chemo. Pt has a port a cath in upper Rt chest.

## 2020-09-29 ENCOUNTER — Other Ambulatory Visit (HOSPITAL_COMMUNITY)
Admission: RE | Admit: 2020-09-29 | Discharge: 2020-09-29 | Disposition: A | Discharge status: Home / Self Care | Payer: Medicare Other | Source: Ambulatory Visit | Attending: Gynecologic Oncology | Admitting: Gynecologic Oncology

## 2020-09-29 DIAGNOSIS — Z01812 Encounter for preprocedural laboratory examination: Secondary | ICD-10-CM | POA: Diagnosis present

## 2020-09-29 DIAGNOSIS — Z20822 Contact with and (suspected) exposure to covid-19: Secondary | ICD-10-CM | POA: Insufficient documentation

## 2020-09-29 LAB — SARS CORONAVIRUS 2 (TAT 6-24 HRS): SARS Coronavirus 2: NEGATIVE

## 2020-10-03 ENCOUNTER — Telehealth: Payer: Self-pay

## 2020-10-03 NOTE — Telephone Encounter (Signed)
Attempted to reach Amber Gray twice. No answer and Voice mail not set up.

## 2020-10-04 ENCOUNTER — Inpatient Hospital Stay (HOSPITAL_COMMUNITY): Payer: Medicare Other | Admitting: Physician Assistant

## 2020-10-04 ENCOUNTER — Inpatient Hospital Stay (HOSPITAL_COMMUNITY)
Admission: RE | Admit: 2020-10-04 | Discharge: 2020-10-07 | DRG: 737 | Disposition: A | Discharge status: Home / Self Care | Payer: Medicare Other | Attending: Gynecologic Oncology | Admitting: Gynecologic Oncology

## 2020-10-04 ENCOUNTER — Other Ambulatory Visit: Payer: Self-pay

## 2020-10-04 ENCOUNTER — Encounter (HOSPITAL_COMMUNITY): Payer: Self-pay | Admitting: Gynecologic Oncology

## 2020-10-04 ENCOUNTER — Inpatient Hospital Stay (HOSPITAL_COMMUNITY): Payer: Medicare Other | Admitting: Registered Nurse

## 2020-10-04 ENCOUNTER — Encounter (HOSPITAL_COMMUNITY)
Admission: RE | Disposition: A | Discharge status: Home / Self Care | Payer: Self-pay | Source: Home / Self Care | Attending: Gynecologic Oncology

## 2020-10-04 DIAGNOSIS — Z888 Allergy status to other drugs, medicaments and biological substances status: Secondary | ICD-10-CM | POA: Diagnosis not present

## 2020-10-04 DIAGNOSIS — G8929 Other chronic pain: Secondary | ICD-10-CM | POA: Diagnosis present

## 2020-10-04 DIAGNOSIS — C7982 Secondary malignant neoplasm of genital organs: Secondary | ICD-10-CM | POA: Diagnosis present

## 2020-10-04 DIAGNOSIS — F32A Depression, unspecified: Secondary | ICD-10-CM | POA: Diagnosis present

## 2020-10-04 DIAGNOSIS — I10 Essential (primary) hypertension: Secondary | ICD-10-CM | POA: Diagnosis present

## 2020-10-04 DIAGNOSIS — C569 Malignant neoplasm of unspecified ovary: Secondary | ICD-10-CM

## 2020-10-04 DIAGNOSIS — Z79899 Other long term (current) drug therapy: Secondary | ICD-10-CM

## 2020-10-04 DIAGNOSIS — E118 Type 2 diabetes mellitus with unspecified complications: Secondary | ICD-10-CM

## 2020-10-04 DIAGNOSIS — Z981 Arthrodesis status: Secondary | ICD-10-CM | POA: Diagnosis not present

## 2020-10-04 DIAGNOSIS — C563 Malignant neoplasm of bilateral ovaries: Secondary | ICD-10-CM

## 2020-10-04 DIAGNOSIS — E039 Hypothyroidism, unspecified: Secondary | ICD-10-CM | POA: Diagnosis present

## 2020-10-04 DIAGNOSIS — Z8249 Family history of ischemic heart disease and other diseases of the circulatory system: Secondary | ICD-10-CM

## 2020-10-04 DIAGNOSIS — C562 Malignant neoplasm of left ovary: Secondary | ICD-10-CM | POA: Diagnosis present

## 2020-10-04 DIAGNOSIS — Z820 Family history of epilepsy and other diseases of the nervous system: Secondary | ICD-10-CM

## 2020-10-04 DIAGNOSIS — Z86718 Personal history of other venous thrombosis and embolism: Secondary | ICD-10-CM

## 2020-10-04 DIAGNOSIS — Z7984 Long term (current) use of oral hypoglycemic drugs: Secondary | ICD-10-CM

## 2020-10-04 DIAGNOSIS — F419 Anxiety disorder, unspecified: Secondary | ICD-10-CM | POA: Diagnosis present

## 2020-10-04 DIAGNOSIS — Z7901 Long term (current) use of anticoagulants: Secondary | ICD-10-CM

## 2020-10-04 DIAGNOSIS — C786 Secondary malignant neoplasm of retroperitoneum and peritoneum: Secondary | ICD-10-CM | POA: Diagnosis present

## 2020-10-04 DIAGNOSIS — Z9221 Personal history of antineoplastic chemotherapy: Secondary | ICD-10-CM

## 2020-10-04 DIAGNOSIS — Z86711 Personal history of pulmonary embolism: Secondary | ICD-10-CM

## 2020-10-04 DIAGNOSIS — E119 Type 2 diabetes mellitus without complications: Secondary | ICD-10-CM | POA: Diagnosis present

## 2020-10-04 DIAGNOSIS — Z87891 Personal history of nicotine dependence: Secondary | ICD-10-CM | POA: Diagnosis not present

## 2020-10-04 DIAGNOSIS — Z90711 Acquired absence of uterus with remaining cervical stump: Secondary | ICD-10-CM | POA: Diagnosis not present

## 2020-10-04 DIAGNOSIS — E7800 Pure hypercholesterolemia, unspecified: Secondary | ICD-10-CM | POA: Diagnosis present

## 2020-10-04 DIAGNOSIS — K219 Gastro-esophageal reflux disease without esophagitis: Secondary | ICD-10-CM | POA: Diagnosis present

## 2020-10-04 DIAGNOSIS — Z87440 Personal history of urinary (tract) infections: Secondary | ICD-10-CM | POA: Diagnosis not present

## 2020-10-04 DIAGNOSIS — Z79890 Hormone replacement therapy (postmenopausal): Secondary | ICD-10-CM

## 2020-10-04 HISTORY — PX: DEBULKING: SHX6277

## 2020-10-04 HISTORY — PX: SALPINGOOPHORECTOMY: SHX82

## 2020-10-04 HISTORY — DX: Nausea with vomiting, unspecified: R11.2

## 2020-10-04 HISTORY — PX: OMENTECTOMY: SHX5985

## 2020-10-04 HISTORY — DX: Other specified postprocedural states: Z98.890

## 2020-10-04 LAB — GLUCOSE, CAPILLARY
Glucose-Capillary: 207 mg/dL — ABNORMAL HIGH (ref 70–99)
Glucose-Capillary: 246 mg/dL — ABNORMAL HIGH (ref 70–99)

## 2020-10-04 SURGERY — SALPINGO-OOPHORECTOMY, OPEN
Anesthesia: General

## 2020-10-04 MED ORDER — GLYCOPYRROLATE PF 0.2 MG/ML IJ SOSY
PREFILLED_SYRINGE | INTRAMUSCULAR | Status: AC
  Filled 2020-10-04: qty 1

## 2020-10-04 MED ORDER — ENOXAPARIN SODIUM 40 MG/0.4ML ~~LOC~~ SOLN: 40.0000 mg | SUBCUTANEOUS | Status: DC

## 2020-10-04 MED ORDER — SIMVASTATIN 40 MG PO TABS
40.0000 mg | ORAL_TABLET | Freq: Every day | ORAL | Status: DC
  Administered 2020-10-06: 40 mg via ORAL
  Filled 2020-10-04 (×2): qty 1

## 2020-10-04 MED ORDER — ONDANSETRON HCL 4 MG PO TABS: 4.0000 mg | ORAL_TABLET | Freq: Four times a day (QID) | ORAL | Status: DC | PRN

## 2020-10-04 MED ORDER — PROPOFOL 10 MG/ML IV BOLUS
INTRAVENOUS | Status: DC | PRN
  Administered 2020-10-04: 100 mg via INTRAVENOUS

## 2020-10-04 MED ORDER — BUPIVACAINE LIPOSOME 1.3 % IJ SUSP
20.0000 mL | Freq: Once | INTRAMUSCULAR | Status: DC
  Filled 2020-10-04: qty 20

## 2020-10-04 MED ORDER — LINAGLIPTIN 5 MG PO TABS
5.0000 mg | ORAL_TABLET | Freq: Every day | ORAL | Status: DC
  Administered 2020-10-05 – 2020-10-07 (×3): 5 mg via ORAL
  Filled 2020-10-04 (×3): qty 1

## 2020-10-04 MED ORDER — INSULIN ASPART 100 UNIT/ML ~~LOC~~ SOLN
0.0000 [IU] | Freq: Every day | SUBCUTANEOUS | Status: DC
  Administered 2020-10-04: 2 [IU] via SUBCUTANEOUS

## 2020-10-04 MED ORDER — EPHEDRINE SULFATE-NACL 50-0.9 MG/10ML-% IV SOSY
PREFILLED_SYRINGE | INTRAVENOUS | Status: DC | PRN
  Administered 2020-10-04: 5 mg via INTRAVENOUS
  Administered 2020-10-04 (×3): 10 mg via INTRAVENOUS

## 2020-10-04 MED ORDER — ROCURONIUM BROMIDE 10 MG/ML (PF) SYRINGE
PREFILLED_SYRINGE | INTRAVENOUS | Status: DC | PRN
  Administered 2020-10-04: 10 mg via INTRAVENOUS
  Administered 2020-10-04: 50 mg via INTRAVENOUS

## 2020-10-04 MED ORDER — DEXAMETHASONE SODIUM PHOSPHATE 4 MG/ML IJ SOLN: 4.0000 mg | INTRAMUSCULAR | Status: DC

## 2020-10-04 MED ORDER — HEPARIN SODIUM (PORCINE) 5000 UNIT/ML IJ SOLN
5000.0000 [IU] | INTRAMUSCULAR | Status: AC
  Administered 2020-10-04: 5000 [IU] via SUBCUTANEOUS
  Filled 2020-10-04: qty 1

## 2020-10-04 MED ORDER — FENTANYL CITRATE (PF) 100 MCG/2ML IJ SOLN
INTRAMUSCULAR | Status: DC | PRN
  Administered 2020-10-04: 50 ug via INTRAVENOUS
  Administered 2020-10-04: 25 ug via INTRAVENOUS
  Administered 2020-10-04: 50 ug via INTRAVENOUS
  Administered 2020-10-04: 25 ug via INTRAVENOUS

## 2020-10-04 MED ORDER — ONDANSETRON HCL 4 MG/2ML IJ SOLN: 4.0000 mg | Freq: Once | INTRAMUSCULAR | Status: DC | PRN

## 2020-10-04 MED ORDER — SENNOSIDES-DOCUSATE SODIUM 8.6-50 MG PO TABS
2.0000 | ORAL_TABLET | Freq: Every day | ORAL | Status: DC
  Administered 2020-10-04 – 2020-10-05 (×2): 2 via ORAL
  Filled 2020-10-04 (×3): qty 2

## 2020-10-04 MED ORDER — ACETAMINOPHEN 10 MG/ML IV SOLN: 1000.0000 mg | Freq: Once | INTRAVENOUS | Status: DC | PRN

## 2020-10-04 MED ORDER — LEVOTHYROXINE SODIUM 88 MCG PO TABS
88.0000 ug | ORAL_TABLET | Freq: Every day | ORAL | Status: DC
  Administered 2020-10-05 – 2020-10-07 (×3): 88 ug via ORAL
  Filled 2020-10-04 (×3): qty 1

## 2020-10-04 MED ORDER — GLYCOPYRROLATE PF 0.2 MG/ML IJ SOSY
PREFILLED_SYRINGE | INTRAMUSCULAR | Status: DC | PRN
  Administered 2020-10-04: .2 mg via INTRAVENOUS

## 2020-10-04 MED ORDER — KCL IN DEXTROSE-NACL 20-5-0.45 MEQ/L-%-% IV SOLN
INTRAVENOUS | Status: DC
  Filled 2020-10-04 (×2): qty 1000

## 2020-10-04 MED ORDER — LIDOCAINE 2% (20 MG/ML) 5 ML SYRINGE
INTRAMUSCULAR | Status: DC | PRN
  Administered 2020-10-04: 1.5 mg/kg/h via INTRAVENOUS
  Administered 2020-10-04: 50 mg via INTRAVENOUS

## 2020-10-04 MED ORDER — OXYCODONE HCL 5 MG PO TABS
5.0000 mg | ORAL_TABLET | ORAL | Status: DC | PRN
  Administered 2020-10-04 – 2020-10-06 (×4): 5 mg via ORAL
  Filled 2020-10-04 (×4): qty 1

## 2020-10-04 MED ORDER — GLUCERNA SHAKE PO LIQD
237.0000 mL | Freq: Two times a day (BID) | ORAL | Status: DC
  Administered 2020-10-04 – 2020-10-07 (×5): 237 mL via ORAL
  Filled 2020-10-04 (×7): qty 237

## 2020-10-04 MED ORDER — FENTANYL CITRATE (PF) 250 MCG/5ML IJ SOLN
INTRAMUSCULAR | Status: AC
  Filled 2020-10-04: qty 5

## 2020-10-04 MED ORDER — ACETAMINOPHEN 500 MG PO TABS
1000.0000 mg | ORAL_TABLET | Freq: Two times a day (BID) | ORAL | Status: DC
  Administered 2020-10-04 – 2020-10-07 (×6): 1000 mg via ORAL
  Filled 2020-10-04 (×6): qty 2

## 2020-10-04 MED ORDER — PANTOPRAZOLE SODIUM 40 MG PO TBEC
40.0000 mg | DELAYED_RELEASE_TABLET | Freq: Every day | ORAL | Status: DC
  Administered 2020-10-05 – 2020-10-07 (×3): 40 mg via ORAL
  Filled 2020-10-04 (×3): qty 1

## 2020-10-04 MED ORDER — LIDOCAINE HCL (PF) 2 % IJ SOLN
INTRAMUSCULAR | Status: AC
  Filled 2020-10-04: qty 5

## 2020-10-04 MED ORDER — EPHEDRINE 5 MG/ML INJ
INTRAVENOUS | Status: AC
  Filled 2020-10-04: qty 10

## 2020-10-04 MED ORDER — SUGAMMADEX SODIUM 200 MG/2ML IV SOLN
INTRAVENOUS | Status: DC | PRN
  Administered 2020-10-04: 100 mg via INTRAVENOUS

## 2020-10-04 MED ORDER — PROPOFOL 10 MG/ML IV BOLUS
INTRAVENOUS | Status: AC
  Filled 2020-10-04: qty 20

## 2020-10-04 MED ORDER — HEMOSTATIC AGENTS (NO CHARGE) OPTIME
TOPICAL | Status: DC | PRN
  Administered 2020-10-04: 1 via TOPICAL

## 2020-10-04 MED ORDER — ONDANSETRON HCL 4 MG/2ML IJ SOLN
INTRAMUSCULAR | Status: DC | PRN
  Administered 2020-10-04: 4 mg via INTRAVENOUS

## 2020-10-04 MED ORDER — ONDANSETRON HCL 4 MG/2ML IJ SOLN
4.0000 mg | Freq: Four times a day (QID) | INTRAMUSCULAR | Status: DC | PRN
  Administered 2020-10-04 – 2020-10-05 (×2): 4 mg via INTRAVENOUS
  Filled 2020-10-04 (×2): qty 2

## 2020-10-04 MED ORDER — GLIMEPIRIDE 1 MG PO TABS
1.0000 mg | ORAL_TABLET | Freq: Every day | ORAL | Status: DC
  Administered 2020-10-05 – 2020-10-07 (×3): 1 mg via ORAL
  Filled 2020-10-04 (×3): qty 1

## 2020-10-04 MED ORDER — SERTRALINE HCL 50 MG PO TABS
50.0000 mg | ORAL_TABLET | Freq: Every day | ORAL | Status: DC
  Administered 2020-10-05 – 2020-10-07 (×3): 50 mg via ORAL
  Filled 2020-10-04 (×3): qty 1

## 2020-10-04 MED ORDER — IBUPROFEN 200 MG PO TABS
600.0000 mg | ORAL_TABLET | Freq: Three times a day (TID) | ORAL | Status: DC
  Administered 2020-10-05 – 2020-10-07 (×7): 600 mg via ORAL
  Filled 2020-10-04 (×8): qty 3

## 2020-10-04 MED ORDER — CHLORHEXIDINE GLUCONATE 0.12 % MT SOLN
15.0000 mL | Freq: Once | OROMUCOSAL | Status: AC
  Administered 2020-10-04: 15 mL via OROMUCOSAL

## 2020-10-04 MED ORDER — CHLORHEXIDINE GLUCONATE CLOTH 2 % EX PADS
6.0000 | MEDICATED_PAD | Freq: Every day | CUTANEOUS | Status: DC
  Administered 2020-10-06 – 2020-10-07 (×2): 6 via TOPICAL

## 2020-10-04 MED ORDER — HYDROMORPHONE HCL 1 MG/ML IJ SOLN
0.5000 mg | INTRAMUSCULAR | Status: DC | PRN
  Administered 2020-10-04 – 2020-10-05 (×4): 0.5 mg via INTRAVENOUS
  Filled 2020-10-04 (×4): qty 0.5

## 2020-10-04 MED ORDER — EMPAGLIFLOZIN 10 MG PO TABS
10.0000 mg | ORAL_TABLET | ORAL | Status: DC
  Administered 2020-10-05 – 2020-10-07 (×2): 10 mg via ORAL
  Filled 2020-10-04 (×2): qty 1

## 2020-10-04 MED ORDER — INSULIN ASPART 100 UNIT/ML ~~LOC~~ SOLN: 0.0000 [IU] | Freq: Three times a day (TID) | SUBCUTANEOUS | Status: DC

## 2020-10-04 MED ORDER — ALBUTEROL SULFATE HFA 108 (90 BASE) MCG/ACT IN AERS
2.0000 | INHALATION_SPRAY | Freq: Four times a day (QID) | RESPIRATORY_TRACT | Status: DC | PRN
  Filled 2020-10-04: qty 6.7

## 2020-10-04 MED ORDER — ROCURONIUM BROMIDE 10 MG/ML (PF) SYRINGE
PREFILLED_SYRINGE | INTRAVENOUS | Status: AC
  Filled 2020-10-04: qty 10

## 2020-10-04 MED ORDER — NON FORMULARY: 1.0000 [IU] | Freq: Three times a day (TID) | Status: DC

## 2020-10-04 MED ORDER — PREGABALIN 25 MG PO CAPS
25.0000 mg | ORAL_CAPSULE | Freq: Two times a day (BID) | ORAL | Status: DC
  Administered 2020-10-05 – 2020-10-07 (×5): 25 mg via ORAL
  Filled 2020-10-04 (×5): qty 1

## 2020-10-04 MED ORDER — DEXAMETHASONE SODIUM PHOSPHATE 10 MG/ML IJ SOLN
INTRAMUSCULAR | Status: AC
  Filled 2020-10-04: qty 1

## 2020-10-04 MED ORDER — LACTATED RINGERS IV SOLN: INTRAVENOUS | Status: DC

## 2020-10-04 MED ORDER — ONDANSETRON HCL 4 MG/2ML IJ SOLN
INTRAMUSCULAR | Status: AC
  Filled 2020-10-04: qty 2

## 2020-10-04 MED ORDER — 0.9 % SODIUM CHLORIDE (POUR BTL) OPTIME
TOPICAL | Status: DC | PRN
  Administered 2020-10-04: 2000 mL

## 2020-10-04 MED ORDER — FENTANYL CITRATE (PF) 100 MCG/2ML IJ SOLN: 25.0000 ug | INTRAMUSCULAR | Status: DC | PRN

## 2020-10-04 MED ORDER — ACETAMINOPHEN 500 MG PO TABS
1000.0000 mg | ORAL_TABLET | ORAL | Status: AC
  Administered 2020-10-04: 1000 mg via ORAL
  Filled 2020-10-04: qty 2

## 2020-10-04 MED ORDER — CHEWING GUM (ORBIT) SUGAR FREE
1.0000 | CHEWING_GUM | Freq: Three times a day (TID) | ORAL | Status: AC
  Administered 2020-10-05 – 2020-10-07 (×9): 1 via ORAL
  Filled 2020-10-04: qty 1

## 2020-10-04 MED ORDER — GABAPENTIN 100 MG PO CAPS
200.0000 mg | ORAL_CAPSULE | ORAL | Status: AC
  Administered 2020-10-04: 200 mg via ORAL
  Filled 2020-10-04: qty 2

## 2020-10-04 MED ORDER — ORAL CARE MOUTH RINSE: 15.0000 mL | Freq: Once | OROMUCOSAL | Status: AC

## 2020-10-04 MED ORDER — DEXAMETHASONE SODIUM PHOSPHATE 10 MG/ML IJ SOLN
INTRAMUSCULAR | Status: DC | PRN
  Administered 2020-10-04: 8 mg via INTRAVENOUS

## 2020-10-04 MED ORDER — BUPIVACAINE HCL 0.25 % IJ SOLN
INTRAMUSCULAR | Status: AC
  Filled 2020-10-04: qty 1

## 2020-10-04 MED ORDER — SODIUM CHLORIDE (PF) 0.9 % IJ SOLN
INTRAMUSCULAR | Status: AC
  Filled 2020-10-04: qty 10

## 2020-10-04 MED ORDER — TRAMADOL HCL 50 MG PO TABS: 100.0000 mg | ORAL_TABLET | Freq: Two times a day (BID) | ORAL | Status: DC | PRN

## 2020-10-04 MED ORDER — CEFAZOLIN SODIUM-DEXTROSE 2-4 GM/100ML-% IV SOLN
2.0000 g | INTRAVENOUS | Status: AC
  Administered 2020-10-04: 2 g via INTRAVENOUS
  Filled 2020-10-04: qty 100

## 2020-10-04 MED ORDER — MEPERIDINE HCL 50 MG/ML IJ SOLN: 6.2500 mg | INTRAMUSCULAR | Status: DC | PRN

## 2020-10-04 MED ORDER — BUPIVACAINE HCL 0.25 % IJ SOLN
INTRAMUSCULAR | Status: DC | PRN
  Administered 2020-10-04: 50 mL

## 2020-10-04 MED ORDER — LIDOCAINE HCL 2 % IJ SOLN
INTRAMUSCULAR | Status: AC
  Filled 2020-10-04: qty 20

## 2020-10-04 SURGICAL SUPPLY — 68 items
ADH SKNCLS APL OCTYL .7 VIOL (GAUZE/BANDAGES/DRESSINGS) ×4
AGENT HMST KT MTR STRL THRMB (HEMOSTASIS)
ATTRACTOMAT 16X20 MAGNETIC DRP (DRAPES) ×3 IMPLANT
BACTOSHIELD CHG 4% 4OZ (MISCELLANEOUS)
BLADE EXTENDED COATED 6.5IN (ELECTRODE) ×3 IMPLANT
CELLS DAT CNTRL 66122 CELL SVR (MISCELLANEOUS) IMPLANT
CHLORAPREP W/TINT 26 (MISCELLANEOUS) ×3 IMPLANT
CLIP VESOCCLUDE LG 6/CT (CLIP) ×3 IMPLANT
CLIP VESOCCLUDE MED 6/CT (CLIP) ×3 IMPLANT
CLIP VESOCCLUDE MED LG 6/CT (CLIP) ×3 IMPLANT
CNTNR URN SCR LID CUP LEK RST (MISCELLANEOUS) IMPLANT
CONT SPEC 4OZ STRL OR WHT (MISCELLANEOUS)
COVER WAND RF STERILE (DRAPES) IMPLANT
DERMABOND ADVANCED (GAUZE/BANDAGES/DRESSINGS) ×2
DERMABOND ADVANCED .7 DNX12 (GAUZE/BANDAGES/DRESSINGS) ×4 IMPLANT
DISSECTOR ROUND CHERRY 3/8 STR (MISCELLANEOUS) ×3 IMPLANT
DRAPE SURG IRRIG POUCH 19X23 (DRAPES) ×3 IMPLANT
DRAPE WARM FLUID 44X44 (DRAPES) ×3 IMPLANT
DRSG OPSITE POSTOP 4X10 (GAUZE/BANDAGES/DRESSINGS) IMPLANT
DRSG OPSITE POSTOP 4X6 (GAUZE/BANDAGES/DRESSINGS) IMPLANT
DRSG OPSITE POSTOP 4X8 (GAUZE/BANDAGES/DRESSINGS) ×3 IMPLANT
ELECT REM PT RETURN 15FT ADLT (MISCELLANEOUS) ×3 IMPLANT
GAUZE 4X4 16PLY RFD (DISPOSABLE) ×3 IMPLANT
GLOVE SURG ENC MOIS LTX SZ6 (GLOVE) ×6 IMPLANT
GLOVE SURG ENC MOIS LTX SZ6.5 (GLOVE) ×6 IMPLANT
GOWN STRL REUS W/ TWL LRG LVL3 (GOWN DISPOSABLE) ×4 IMPLANT
GOWN STRL REUS W/TWL LRG LVL3 (GOWN DISPOSABLE) ×6
HEMOSTAT ARISTA ABSORB 3G PWDR (HEMOSTASIS) IMPLANT
KIT BASIN OR (CUSTOM PROCEDURE TRAY) ×3 IMPLANT
KIT SIGMOIDOSCOPE (SET/KITS/TRAYS/PACK) ×3 IMPLANT
KIT TURNOVER KIT A (KITS) IMPLANT
LIGASURE IMPACT 36 18CM CVD LR (INSTRUMENTS) ×3 IMPLANT
LOOP VESSEL MAXI BLUE (MISCELLANEOUS) IMPLANT
LUBRICANT JELLY K Y 4OZ (MISCELLANEOUS) ×3 IMPLANT
NEEDLE HYPO 21X1.5 SAFETY (NEEDLE) ×6 IMPLANT
NEEDLE HYPO 22GX1.5 SAFETY (NEEDLE) IMPLANT
NS IRRIG 1000ML POUR BTL (IV SOLUTION) ×6 IMPLANT
PACK GENERAL/GYN (CUSTOM PROCEDURE TRAY) ×3 IMPLANT
PENCIL SMOKE EVACUATOR (MISCELLANEOUS) IMPLANT
RETRACTOR WND ALEXIS 25 LRG (MISCELLANEOUS) IMPLANT
RTRCTR WOUND ALEXIS 18CM MED (MISCELLANEOUS)
RTRCTR WOUND ALEXIS 25CM LRG (MISCELLANEOUS)
SCRUB CHG 4% DYNA-HEX 4OZ (MISCELLANEOUS) IMPLANT
SHEET LAVH (DRAPES) ×3 IMPLANT
SLEEVE SUCTION CATH 165 (SLEEVE) ×3 IMPLANT
SPONGE LAP 18X18 RF (DISPOSABLE) IMPLANT
SURGIFLO W/THROMBIN 8M KIT (HEMOSTASIS) IMPLANT
SUT MNCRL AB 4-0 PS2 18 (SUTURE) ×6 IMPLANT
SUT PDS AB 1 TP1 96 (SUTURE) ×6 IMPLANT
SUT VIC AB 0 CT1 27 (SUTURE) ×3
SUT VIC AB 0 CT1 27XBRD ANTBC (SUTURE) ×2 IMPLANT
SUT VIC AB 0 CT1 36 (SUTURE) ×15 IMPLANT
SUT VIC AB 2-0 CT1 27 (SUTURE) ×6
SUT VIC AB 2-0 CT1 36 (SUTURE) ×6 IMPLANT
SUT VIC AB 2-0 CT1 TAPERPNT 27 (SUTURE) ×4 IMPLANT
SUT VIC AB 2-0 CT2 27 (SUTURE) ×6 IMPLANT
SUT VIC AB 2-0 SH 27 (SUTURE) ×3
SUT VIC AB 2-0 SH 27X BRD (SUTURE) ×2 IMPLANT
SUT VIC AB 3-0 CTX 36 (SUTURE) IMPLANT
SUT VIC AB 3-0 SH 18 (SUTURE) IMPLANT
SUT VIC AB 3-0 SH 27 (SUTURE) ×6
SUT VIC AB 3-0 SH 27X BRD (SUTURE) ×4 IMPLANT
SUT VIC AB 4-0 PS2 27 (SUTURE) ×3 IMPLANT
SYR 30ML LL (SYRINGE) ×6 IMPLANT
TOWEL OR 17X26 10 PK STRL BLUE (TOWEL DISPOSABLE) ×3 IMPLANT
TOWEL OR NON WOVEN STRL DISP B (DISPOSABLE) ×3 IMPLANT
TRAY FOLEY MTR SLVR 16FR STAT (SET/KITS/TRAYS/PACK) ×3 IMPLANT
UNDERPAD 30X36 HEAVY ABSORB (UNDERPADS AND DIAPERS) ×3 IMPLANT

## 2020-10-04 NOTE — Op Note (Signed)
OPERATIVE NOTE  Pre-operative Diagnosis: Advanced ovarian cancer, s/p 4 cycles of NACT  Post-operative Diagnosis: same as above  Operation: Exlap, radical tumor debulking including omentectomy, BSO, trachelectomy  Surgeon: [name]  Assistant Surgeon: Lahoma Crocker MD (an MD assistant was necessary for tissue manipulation, management of robotic instrumentation, retraction and positioning due to the complexity of the case and hospital policies).   Anesthesia: GET  Urine Output: 250cc  Operative Findings: On EUA, cervix not definitively appreciated. Nodularity and somewhat fixed mass noted in the cul de sac. On intra-abdominal entry, normal and smooth diaphragm, stomach, liver edge. Omentum densely adherent to the transverse colon with two 3-5cm cystic masses vs treated tumor within the infracolic portion of the mass.  One of these masses was densely adherent to the anterior abdominal wall in the left upper abdomen.  Otherwise, omentum without evidence of disease. Small bowel normal in appearance. No adenopathy. No ascites. Bilateral tubes normal appearing. Ovaries in conglomerate mass with cervix. Mass densely adherent to the rectum posteriorly. Bladder adherent anteriorly to the cervix and superior to the cervix/ovarian mass conglomeration. After resection of the cervix and bilateral adnexa, the remaining nodules were able to be removed from the rectum.  Bubble test was negative for disruption of the rectum.  No palpable or visible tumor at the end of surgery (R0 resection)  Estimated Blood Loss: 400    Total IV Fluids: see I&O flowsheet         Specimens: omentum, cervix, bilateral tubes and ovaries         Complications:  None apparent; patient tolerated the procedure well.         Disposition: PACU - hemodynamically stable.  Procedure Details  The patient was seen in the Holding Room. The risks, benefits, complications, treatment options, and expected outcomes were  discussed with the patient.  The patient concurred with the proposed plan, giving informed consent.  The site of surgery properly noted/marked. The patient was identified as Amber Gray and the procedure verified as a BSO and tumor debulking.   After induction of anesthesia, the patient was draped and prepped in the usual sterile manner.  She was prepped and draped in the normal sterile fashion in the dorsal lithotomy position in padded Allen stirrups with good attention paid to support of the lower back and lower extremities. Position was adjusted for appropriate support. A Foley catheter was placed to gravity.   A midline vertical incision was made and carried through the subcutaneous tissue to the fascia. The fascial incision was made and extended superiorally. The rectus muscles were separated. The peritoneum was identified and entered. Peritoneal incision was extended longitudinally.  The abdominal cavity was entered sharply and without incident.   After the abdominal cavity had been inspected and palpated, attention was turned to the cystic omental mass adherent to the anterior abdominal wall.  With traction on the peritoneum and fascia, monopolar electrocautery was used to create a plane between the rectus muscle and cystic mass, sacrificing the peritoneum.  Once freed, attention was turned to an omentectomy.  Monopolar electrocautery and LigaSure device were used to perform an omentectomy.  The other cystic mass was quite adherent to the transverse colon.  Sharp dissection was used to meticulously dissect the tumor free from the colon.  The transverse colon was intact on inspection.  A Bookwalter retractor was then placed. A survey of the pelvis revealed the above findings. After packing the small bowel into the upper abdomen, we began  the procedure by entering the  pelvic sidewall just posterior to the right round ligament. The pararectal space was developed and the retroperitoneum developed  up to the level of the common iliac artery.  The course of the ureter was identified with ease. The IP was then skeletonized, cauterized and transected with the LigaSure device.  The same was performed on the left after monopolar electrocautery was used to free the sigmoid epiploica from the left sidewall.   Kelly clamps were placed on both remnants of the round ligaments. The remnant round ligaments were transected with the bovie. The anterior peritoneal reflection was incised and the bladder was dissected off the cervix.  An assistant placed a sponge stick in the vagina to identify the cervicovaginal junction.  The posterior peritoneum was cauterized and transected to below the level of the cervix.  The remnants of the uterine vessels were skeletonized bilaterally , then cauterized and transected using the LigaSure device.  Entrance was made into the vagina anteriorly, with with the guidance of the sponge stick in the vagina.  Curved clamps were then placed on each lateral aspect of the cervix.  The tissue superior to the clamp was cut and this was suture-ligated with an 0 Vicryl stitch.  After assuring the rectum and tumor posteriorly were free of the cervix, a curved clamp was placed across the posterior aspect of the vagina, just inferior to the cervix.  The specimen was then amputated, and the cervix and bilateral adnexa handed off the field.  The vaginal cuff angle were suture ligated with 0-Vicryl suture. The vaginal cuff was then closed with a running 0 Vicryl stitch.  Attention was then turned to the rectum.  There were several areas of tumor adherent to the surface of the rectum, that were easily either bluntly or sharply dissected free from the rectum.  The rectum including serosa did not appear to be disrupted.  The pelvis was copiously irrigated. Hemostasis was observed.  Given the adherence of tumor to the rectum and initial thought that the patient would require a rectosigmoid resection, decision  was made to perform a bubble test.  Proctoscope was entered into the rectum and after the pelvis was filled with saline and the sigmoid clamped, air was placed within the rectum.  Bubble test was negative.  Given raw surfaces within the pelvis, Hemoblast was used and pressure held for 3 minutes.  The fascia was reapproximated with 0 looped PDS using a total of two sutures, tied in the midline. The subcutaneous layer was then irrigated copiously.  Quarter percent Marcaine was then injected for local anesthesia.  The subcutaneous layer was reapproximated with interrupted 2-0 Vicryl. The subcutaneous layer was then reapproximated with a running 4-0 Vicryl.  The skin was closed with 4-0 Monocryl in subcuticular fashion.  Dermabond was then used in a honeycomb dressing applied.   The vagina was swabbed with  minimal bleeding noted.   All sponge, lap and needle counts were correct x  3.   The patient was transferred to the recovery room in stable condition.  Eugene Garnet, MD

## 2020-10-04 NOTE — Anesthesia Procedure Notes (Signed)
Procedure Name: Intubation Date/Time: 10/04/2020 12:16 PM Performed by: Victoriano Lain, CRNA Pre-anesthesia Checklist: Patient identified, Emergency Drugs available, Suction available, Patient being monitored and Timeout performed Patient Re-evaluated:Patient Re-evaluated prior to induction Oxygen Delivery Method: Circle system utilized Preoxygenation: Pre-oxygenation with 100% oxygen Induction Type: IV induction Ventilation: Mask ventilation without difficulty Laryngoscope Size: Mac and 3 Grade View: Grade I Tube type: Oral Tube size: 7.0 mm Number of attempts: 1 Airway Equipment and Method: Stylet Placement Confirmation: ETT inserted through vocal cords under direct vision,  positive ETCO2 and breath sounds checked- equal and bilateral Secured at: 21 cm Tube secured with: Tape Dental Injury: Teeth and Oropharynx as per pre-operative assessment

## 2020-10-04 NOTE — Brief Op Note (Signed)
10/04/2020  3:31 PM  PATIENT:  Amber Gray  80 y.o. female  PRE-OPERATIVE DIAGNOSIS:  OVARIAN CANCER  POST-OPERATIVE DIAGNOSIS:  OVARIAN CANCER  PROCEDURE:  Procedure(s): RACICAL OVARIAN CANCER DEBULKING INCLUDING BILATERAL  SALPINGO OOPHORECTOMY AND TRACHELECTOMY (Bilateral) OMENTECTOMY (N/A) TUMOR DEBULKING (N/A)  SURGEON:  Surgeon(s) and Role:    Carver Fila, MD - Primary    * Antionette Char, MD - Assisting  ANESTHESIA:   general  EBL:  350 mL   BLOOD ADMINISTERED:none  DRAINS: none   LOCAL MEDICATIONS USED:  MARCAINE     SPECIMEN:  Cervix, bilateral adnexa, omentum  DISPOSITION OF SPECIMEN:  PATHOLOGY  COUNTS:  YES  TOURNIQUET:  * No tourniquets in log *  DICTATION: .Note written in EPIC  PLAN OF CARE: Admit to inpatient   PATIENT DISPOSITION:  PACU - hemodynamically stable.   Delay start of Pharmacological VTE agent (>24hrs) due to surgical blood loss or risk of bleeding: no

## 2020-10-04 NOTE — Anesthesia Postprocedure Evaluation (Signed)
Anesthesia Post Note  Patient: Amber Gray  Procedure(s) Performed: RACICAL OVARIAN CANCER DEBULKING INCLUDING BILATERAL  SALPINGO OOPHORECTOMY AND TRACHELECTOMY (Bilateral ) OMENTECTOMY (N/A ) TUMOR DEBULKING (N/A )     Patient location during evaluation: PACU Anesthesia Type: General Level of consciousness: awake and sedated Pain management: pain level controlled Vital Signs Assessment: post-procedure vital signs reviewed and stable Respiratory status: spontaneous breathing Cardiovascular status: stable Postop Assessment: no apparent nausea or vomiting Anesthetic complications: no   No complications documented.  Last Vitals:  Vitals:   10/04/20 1600 10/04/20 1615  BP: 131/66 133/63  Pulse: 60 61  Resp: 14 15  Temp:  36.4 C  SpO2: 93% 93%    Last Pain:  Vitals:   10/04/20 1615  TempSrc:   PainSc: 0-No pain                 Caren Macadam

## 2020-10-04 NOTE — Anesthesia Preprocedure Evaluation (Signed)
Anesthesia Evaluation  Patient identified by MRN, date of birth, ID band Patient awake    Reviewed: Allergy & Precautions, NPO status , Patient's Chart, lab work & pertinent test results  History of Anesthesia Complications (+) PONV and history of anesthetic complications  Airway Mallampati: I       Dental no notable dental hx.    Pulmonary former smoker,    Pulmonary exam normal        Cardiovascular hypertension, Normal cardiovascular exam     Neuro/Psych PSYCHIATRIC DISORDERS Anxiety Depression negative neurological ROS     GI/Hepatic hiatal hernia, GERD  Medicated,  Endo/Other  diabetes, Type 2, Oral Hypoglycemic AgentsHypothyroidism   Renal/GU Renal disease  negative genitourinary   Musculoskeletal   Abdominal Normal abdominal exam  (+)   Peds  Hematology  (+) anemia ,   Anesthesia Other Findings : 2D Echo, Color Doppler, Cardiac Doppler and Intracardiac       Opacification Agent   Indications:  Pulmonary embolus    History:    Patient has no prior history of Echocardiogram  examinations.         Signs/Symptoms:Chest Pain and Shortness of Breath; Risk         Factors:Former Smoker, Diabetes and Hypertension. CKD.    Sonographer:  Clayton Lefort RDCS (AE)  Referring Phys: [id number] [place]    1. Left ventricular ejection fraction, by estimation, is 60 to 65%. The  left ventricle has normal function. The left ventricle has no regional  wall motion abnormalities. There is mild left ventricular hypertrophy.  Left ventricular diastolic parameters  are consistent with Grade I diastolic dysfunction (impaired relaxation).  There is the interventricular septum is flattened in systole and diastole,  consistent with right ventricular pressure and volume overload.  2. Right ventricular systolic function is moderately reduced. The right  ventricular size is  moderately enlarged. There is mildly elevated  pulmonary artery systolic pressure.  3. The mitral valve is normal in structure. Trivial mitral valve  regurgitation.  4. The aortic valve is normal in structure. Aortic valve regurgitation is  trivial. Mild to moderate aortic valve sclerosis/calcification is present,  without any evidence of aortic stenosis.   FINDINGS  Left Ventricle: Left ventricular ejection fraction, by estimation, is 60  to 65%. The left ventricle has normal function. The left ventricle has no  regional wall motion abnormalities. Definity contrast agent was given IV  to delineate the left ventricular  endocardial borders. The left ventricular internal cavity size was normal  in size. There is mild left ventricular hypertrophy. The interventricular  septum is flattened in systole and diastole, consistent with right  ventricular pressure and volume  overload. Left ventricular diastolic parameters are consistent with Grade  I diastolic dysfunction (impaired relaxation).   Right Ventricle: The right ventricular size is moderately enlarged. No  increase in right ventricular wall thickness. Right ventricular systolic  function is moderately reduced. There is mildly elevated pulmonary artery  systolic pressure. The tricuspid  regurgitant velocity is 2.92 m/s, and with an assumed right atrial  pressure of 10 mmHg, the estimated right ventricular systolic pressure is  [id number] mmHg.   Left Atrium: Left atrial size was normal in size.   Right Atrium: Right atrial size was not assessed.   Pericardium: There is no evidence of pericardial effusion.   Mitral Valve: The mitral valve is normal in structure. Trivial mitral  valve regurgitation.   Tricuspid Valve: The tricuspid valve is normal in structure. Tricuspid  valve regurgitation is mild.   Aortic Valve: The aortic valve is normal in structure.. There is mild  thickening of the aortic valve. Aortic valve  regurgitation is trivial.  Mild to moderate aortic valve sclerosis/calcification is present, without  any evidence of aortic stenosis. There  is mild thickening of the aortic valve. Aortic valve mean gradient  measures 3.0 mmHg. Aortic valve peak gradient measures 6.1 mmHg. Aortic  valve area, by VTI measures 1.84 cm.   Pulmonic Valve: The pulmonic valve was grossly normal. Pulmonic valve  regurgitation is mild.   Aorta: The aortic root and ascending aorta are structurally normal, with  no evidence of dilitation.   IAS/Shunts: No atrial level shunt detected by color flow Doppler.     LEFT VENTRICLE  PLAX 2D  LVIDd:     2.70 cm Diastology  LVIDs:     2.00 cm LV e' lateral:  6.09 cm/s  LV PW:     1.30 cm LV E/e' lateral: 8.0  LV IVS:    1.20 cm LV e' medial:  4.46 cm/s  LVOT diam:   1.65 cm LV E/e' medial: 10.9  LV SV:     45  LV SV Index:  29  LVOT Area:   2.14 cm     RIGHT VENTRICLE       IVC  RV Basal diam: 2.70 cm   IVC diam: 1.60 cm  RV S prime:   10.40 cm/s  TAPSE (M-mode): 1.8 cm   LEFT ATRIUM      Index    RIGHT ATRIUM      Index  LA diam:   2.60 cm 1.67 cm/m RA Area:   13.40 cm  LA Vol (A4C): 28.1 ml 18.10 ml/m RA Volume:  35.20 ml 22.67 ml/m  AORTIC VALVE  AV Area (Vmax):  1.93 cm  AV Area (Vmean):  1.74 cm  AV Area (VTI):   1.84 cm  AV Vmax:      123.00 cm/s  AV Vmean:     79.700 cm/s  AV VTI:      0.243 m  AV Peak Grad:   6.1 mmHg  AV Mean Grad:   3.0 mmHg  LVOT Vmax:     111.00 cm/s  LVOT Vmean:    64.800 cm/s  LVOT VTI:     0.209 m  LVOT/AV VTI ratio: 0.86    AORTA  Ao Root diam: 3.00 cm  Ao Asc diam: 3.10 cm   MITRAL VALVE        TRICUSPID VALVE  MV Area (PHT): 2.62 cm  TR Peak grad:  34.1 mmHg  MV Decel Time: 289 msec  TR Vmax:    292.00 cm/s  MV E velocity: 48.80 cm/s  MV A velocity: 84.00 cm/s SHUNTS  MV  E/A ratio: 0.58    Systemic VTI: 0.21 m               Systemic Diam: 1.65 cm   Glori Bickers MD  Electronically signed by Glori Bickers MD  Signature Date/Time: 05/21/2020/2:27:06 PM      Final    Imaging Info  ECHOCARDIOGRAM COMPLETE (Order [id number]) on 05/21/20  Study History  ECHOCARDIOGRAM COMPLETE (Order [id number]) on 05/21/20  Syngo Images  Show images for ECHOCARDIOGRAM COMPLETE  Images on [employer]  Show images for Gasper Lloyd  Performing Technologist/Nurse  Performing Technologist/Nurse: Marybelle Killings, RT   Reason for Exam Priority: Routine Not on file  Patient Data  Height  60  in  BP  127/73 mmHg     Surgical History   Surgical History   No past medical history on file.   Other Surgical History   Procedure Laterality Date Comment Source ANKLE ARTHROSCOPY Right 03/29/2020 Procedure: RIGHT ANKLE ARTHROSCOPY AND DEBRIDEMENT; Surgeon: Nadara Mustard, MD; Location: [facility]; Service: Orthopedics; Laterality: Right;  BACK SURGERY     BREAST BIOPSY Left    CATARACT EXTRACTION W/ INTRAOCULAR LENS IMPLANT, BILATERAL Bilateral    COLONOSCOPY  10/25/2005 normal  DILATION AND CURETTAGE OF UTERUS     IR IMAGING GUIDED PORT INSERTION  05/24/2020   IR PARACENTESIS  05/24/2020   PARTIAL HYSTERECTOMY  1968   POSTERIOR CERVICAL FUSION/FORAMINOTOMY   "fused 4,5,6"  TONSILLECTOMY       Implants    No active implants to display in this view.  Encounter-Level Documents on 05/20/2020:  Document on 05/25/2020 2:12 PM by Laddie Aquas, RN: IP After Visit Summary Document on 05/25/2020 4:44 AM by Eben Burow, RN: ED PB Billing Extract Scan on 05/25/2020 9:48 AM by Default, Provider, MD Scan on 05/25/2020 7:11 AM by Default, Provider, MD Electronic signature on 05/20/2020 4:59 PM - 1 of 3 e-signatures recorded Scan on 06/03/2020 1:39 PM by Karen Kays D: cert mail return reciept Scan  on 05/24/2020 3:30 PM by Edward Qualia Scan on 05/24/2020 12:17 PM by Karen Kays D: cert mail info Scan on 05/24/2020 12:16 PM by Dorothea Glassman Documents on 05/20/2020:  Scan on 05/24/2020 3:38 PM by Duwaine Maxin, RT Scan on 05/24/2020 2:35 PM by Campbell Stall: CT Guided LEFT Omental mass biopsy Scan on 05/23/2020 11:21 AM by Default, Provider, MD Scan on 05/21/2020 3:16 PM by Default, Provider, MD Scan on 05/21/2020 3:16 PM by Default, Provider, MD Scan on 05/21/2020 2:27 PM by Default, Provider, MD Scan on 05/21/2020 11:22 AM by Fanny Skates, RT    Resulted by:  Signed Date/Time  Phone Pager Dolores Patty 05/21/2020 2:27 PM [phone]   External Result Report  External Result Report  ECHOCARDIOGRAM COMPLETE: Patient Communication  Add Comments Add Notifications   Existing Charges  Charge Line Charge Code Status Charge Trigger Charge Type None         Reproductive/Obstetrics                             Anesthesia Physical Anesthesia Plan  ASA: II  Anesthesia Plan: General   Post-op Pain Management:    Induction:   PONV Risk Score and Plan: 4 or greater and Ondansetron and Treatment may vary due to age or medical condition  Airway Management Planned: Oral ETT  Additional Equipment: None  Intra-op Plan:   Post-operative Plan: Extubation in OR  Informed Consent: I have reviewed the patients History and Physical, chart, labs and discussed the procedure including the risks, benefits and alternatives for the proposed anesthesia with the patient or authorized representative who has indicated his/her understanding and acceptance.     Dental advisory given  Plan Discussed with: CRNA  Anesthesia Plan Comments:         Anesthesia Quick Evaluation

## 2020-10-04 NOTE — Transfer of Care (Signed)
Immediate Anesthesia Transfer of Care Note  Patient: Amber Gray  Procedure(s) Performed: RACICAL OVARIAN CANCER DEBULKING INCLUDING BILATERAL  SALPINGO OOPHORECTOMY AND TRACHELECTOMY (Bilateral ) OMENTECTOMY (N/A ) TUMOR DEBULKING (N/A )  Patient Location: PACU  Anesthesia Type:General  Level of Consciousness: awake, alert , oriented and patient cooperative  Airway & Oxygen Therapy: Patient Spontanous Breathing and Patient connected to face mask oxygen  Post-op Assessment: Report given to RN, Post -op Vital signs reviewed and stable and Patient moving all extremities X 4  Post vital signs: Reviewed and stable  Last Vitals:  Vitals Value Taken Time  BP 136/67 10/04/20 1545  Temp    Pulse 66 10/04/20 1546  Resp 13 10/04/20 1546  SpO2 98 % 10/04/20 1546  Vitals shown include unvalidated device data.  Last Pain:  Vitals:   10/04/20 1020  TempSrc: Oral  PainSc: 0-No pain      Patients Stated Pain Goal: 3 (10/04/20 1020)  Complications: No complications documented.

## 2020-10-04 NOTE — Interval H&P Note (Signed)
History and Physical Interval Note:  10/04/2020 11:23 AM  Amber Gray  has presented today for surgery, with the diagnosis of OVARIAN CANCER.  The various methods of treatment have been discussed with the patient and family. After consideration of risks, benefits and other options for treatment, the patient has consented to  Procedure(s): OPEN SALPINGO OOPHORECTOMY (Bilateral) OMENTECTOMY (N/A) TUMOR DEBULKING (N/A) as a surgical intervention.  The patient's history has been reviewed, patient examined, no change in status, stable for surgery.  I have reviewed the patient's chart and labs.  Questions were answered to the patient's satisfaction.     Carver Fila

## 2020-10-05 ENCOUNTER — Encounter (HOSPITAL_COMMUNITY): Payer: Self-pay | Admitting: Gynecologic Oncology

## 2020-10-05 LAB — CBC
HCT: 29.1 % — ABNORMAL LOW (ref 36.0–46.0)
Hemoglobin: 9.4 g/dL — ABNORMAL LOW (ref 12.0–15.0)
MCH: 31.3 pg (ref 26.0–34.0)
MCHC: 32.3 g/dL (ref 30.0–36.0)
MCV: 97 fL (ref 80.0–100.0)
Platelets: 277 10*3/uL (ref 150–400)
RBC: 3 MIL/uL — ABNORMAL LOW (ref 3.87–5.11)
RDW: 16.7 % — ABNORMAL HIGH (ref 11.5–15.5)
WBC: 8.8 10*3/uL (ref 4.0–10.5)
nRBC: 0 % (ref 0.0–0.2)

## 2020-10-05 LAB — GLUCOSE, CAPILLARY
Glucose-Capillary: 235 mg/dL — ABNORMAL HIGH (ref 70–99)
Glucose-Capillary: 191 mg/dL — ABNORMAL HIGH (ref 70–99)
Glucose-Capillary: 60 mg/dL — ABNORMAL LOW (ref 70–99)
Glucose-Capillary: 70 mg/dL (ref 70–99)
Glucose-Capillary: 110 mg/dL — ABNORMAL HIGH (ref 70–99)

## 2020-10-05 LAB — BASIC METABOLIC PANEL
Anion gap: 11 (ref 5–15)
BUN: 32 mg/dL — ABNORMAL HIGH (ref 8–23)
CO2: 23 mmol/L (ref 22–32)
Calcium: 8.5 mg/dL — ABNORMAL LOW (ref 8.9–10.3)
Chloride: 103 mmol/L (ref 98–111)
Creatinine, Ser: 0.98 mg/dL (ref 0.44–1.00)
GFR, Estimated: 58 mL/min — ABNORMAL LOW (ref >60)
Glucose, Bld: 236 mg/dL — ABNORMAL HIGH (ref 70–99)
Potassium: 3.9 mmol/L (ref 3.5–5.1)
Sodium: 137 mmol/L (ref 135–145)

## 2020-10-05 MED ORDER — BISACODYL 10 MG RE SUPP
10.0000 mg | Freq: Every day | RECTAL | Status: DC
  Administered 2020-10-06 – 2020-10-07 (×2): 10 mg via RECTAL
  Filled 2020-10-05: qty 1

## 2020-10-05 MED ORDER — ENOXAPARIN SODIUM 40 MG/0.4ML ~~LOC~~ SOLN
40.0000 mg | SUBCUTANEOUS | Status: DC
  Administered 2020-10-05 – 2020-10-07 (×3): 40 mg via SUBCUTANEOUS
  Filled 2020-10-05 (×3): qty 0.4

## 2020-10-05 MED ORDER — BISACODYL 10 MG RE SUPP
10.0000 mg | Freq: Every day | RECTAL | Status: DC | PRN
  Filled 2020-10-05: qty 1

## 2020-10-05 MED ORDER — INSULIN ASPART 100 UNIT/ML ~~LOC~~ SOLN
0.0000 [IU] | Freq: Three times a day (TID) | SUBCUTANEOUS | Status: DC
  Administered 2020-10-05: 3 [IU] via SUBCUTANEOUS
  Administered 2020-10-06: 2 [IU] via SUBCUTANEOUS

## 2020-10-05 MED ORDER — INSULIN ASPART 100 UNIT/ML ~~LOC~~ SOLN: 0.0000 [IU] | Freq: Every day | SUBCUTANEOUS | Status: DC

## 2020-10-05 NOTE — Progress Notes (Signed)
Patient ID: Amber Gray, female   DOB: Mar 18, 1941, 80 y.o.   MRN: [id number]  1 Day Post-Op Procedure(s) (LRB): RACICAL OVARIAN CANCER DEBULKING INCLUDING BILATERAL  SALPINGO OOPHORECTOMY AND TRACHELECTOMY (Bilateral) OMENTECTOMY (N/A) TUMOR DEBULKING (N/A)  Subjective: Patient reports incisional pain.    Objective: Vital signs in last 24 hours: Temp:  [97.5 F (36.4 C)-98.3 F (36.8 C)] 98 F (36.7 C) (01/05 0603) Pulse Rate:  [52-70] 70 (01/05 0603) Resp:  [12-20] 14 (01/05 0603) BP: (109-150)/(58-68) 109/64 (01/05 0603) SpO2:  [93 %-100 %] 97 % (01/05 0603) Weight:  [47.5 kg] 47.5 kg (01/04 1849) Last BM Date: 10/03/20  Intake/Output from previous day: 01/04 0701 - 01/05 0700 In: 1900 [I.V.:1800; IV Piggyback:100] Out: 1325 [Urine:975; Blood:350]  Physical Examination: General: alert GI: soft, non-tender; bowel sounds normal; no masses,  no organomegaly, incision: clean, dry and intact and mild distension Extremities: extremities normal, atraumatic, no cyanosis or edema and Homans sign is negative, no sign of DVT Vaginal Bleeding: none   Labs:     CBG (last 3)  Recent Labs    10/04/20 1715 10/04/20 2141 10/05/20 0753  GLUCAP 207* 246* 235*     Assessment:  80 y.o. s/p Procedure(s): RACICAL OVARIAN CANCER DEBULKING INCLUDING BILATERAL  SALPINGO OOPHORECTOMY AND TRACHELECTOMY OMENTECTOMY TUMOR DEBULKING: stable  Pain:  Pain is controlled on oral medications.  CV: Hypertension: B/Ps in range  GI:  Tolerating po: Yes     Endo: Diabetes mellitus Type II, under fair control.   Prophylaxis: pharmacologic prophylaxis (with any of the following: enoxaparin (Lovenox) 40mg  SQ 2 hours prior to surgery then every day).  Plan: Review labs Encourage ambulation Adjust correction insulin regimen: goal for CBGs 140 - 180 K pad Saline lock IV .   LOS: 1 day    , MD 10/05/2020, 9:36 AM

## 2020-10-05 NOTE — Progress Notes (Signed)
Hypoglycemic Event  CBG: 60  Treatment: pt tolerated 4oz apple juice, per protocol and is eating her supper now.   Symptoms: pt stated she felt slightly dizzy at time of event, now stabilized with no symptoms.   Follow-up CBG: Time: 1655 CBG Result: 70  Possible Reasons for Event: pt did not tolerate any oral intake this afternoon.   Comments/MD notified: MD made aware.     Amber Gray

## 2020-10-06 LAB — CBC
HCT: 27.3 % — ABNORMAL LOW (ref 36.0–46.0)
Hemoglobin: 8.6 g/dL — ABNORMAL LOW (ref 12.0–15.0)
MCH: 30.8 pg (ref 26.0–34.0)
MCHC: 31.5 g/dL (ref 30.0–36.0)
MCV: 97.8 fL (ref 80.0–100.0)
Platelets: 257 10*3/uL (ref 150–400)
RBC: 2.79 MIL/uL — ABNORMAL LOW (ref 3.87–5.11)
RDW: 16.5 % — ABNORMAL HIGH (ref 11.5–15.5)
WBC: 7.7 10*3/uL (ref 4.0–10.5)
nRBC: 0 % (ref 0.0–0.2)

## 2020-10-06 LAB — BASIC METABOLIC PANEL
Anion gap: 9 (ref 5–15)
BUN: 25 mg/dL — ABNORMAL HIGH (ref 8–23)
CO2: 24 mmol/L (ref 22–32)
Calcium: 8.6 mg/dL — ABNORMAL LOW (ref 8.9–10.3)
Chloride: 105 mmol/L (ref 98–111)
Creatinine, Ser: 1.14 mg/dL — ABNORMAL HIGH (ref 0.44–1.00)
GFR, Estimated: 49 mL/min — ABNORMAL LOW (ref >60)
Glucose, Bld: 93 mg/dL (ref 70–99)
Potassium: 3.7 mmol/L (ref 3.5–5.1)
Sodium: 138 mmol/L (ref 135–145)

## 2020-10-06 LAB — GLUCOSE, CAPILLARY
Glucose-Capillary: 121 mg/dL — ABNORMAL HIGH (ref 70–99)
Glucose-Capillary: 129 mg/dL — ABNORMAL HIGH (ref 70–99)
Glucose-Capillary: 81 mg/dL (ref 70–99)
Glucose-Capillary: 78 mg/dL (ref 70–99)

## 2020-10-06 LAB — SURGICAL PATHOLOGY

## 2020-10-06 NOTE — Progress Notes (Signed)
Inpatient Diabetes Program Recommendations  AACE/ADA: New Consensus Statement on Inpatient Glycemic Control (2015)  Target Ranges:  Prepandial:   less than 140 mg/dL      Peak postprandial:   less than 180 mg/dL (1-2 hours)      Critically ill patients:  140 - 180 mg/dL   Lab Results  Component Value Date   GLUCAP 121 (H) 10/06/2020   HGBA1C 7.1 (H) 05/21/2020    Review of Glycemic Control Results for Amber Gray, Amber Gray (MRN [id number]) as of 10/06/2020 09:54  Ref. Range 10/05/2020 16:40 10/05/2020 17:09 10/05/2020 21:45 10/06/2020 07:32  Glucose-Capillary Latest Ref Range: 70 - 99 mg/dL 60 (L) 70 086 (H) 578 (H)   Diabetes history: Type 2 DM Outpatient Diabetes medications: Jardiance 10 mg QD, Amaryl 1 mg QD, Tradjenta 5 mg QD Current orders for Inpatient glycemic control: Novolog 0-15 units TID, Novolog 0-5 units QHS, Jardiance 10 mg QD, Amaryl 1 mg QD, Tradjenta 5 mg QD  Inpatient Diabetes Program Recommendations:    Noted patient experienced hypoglycemia following Novolog 3 units. Consider decreasing correction to Novolog 0-6 units TID.   Thanks, Lujean Rave, MSN, RNC-OB Diabetes Coordinator [phone] (8a-5p)

## 2020-10-06 NOTE — Progress Notes (Signed)
After multiple conversations and encouragement from RN and nurse tech patient agreed to walk in the hall once. Patient walked about 100 ft with front wheel walker. Patient tolerated the walk well, only complaint mentioned to RN was in reference to pain in abdominal area which she understood is to be expected. RN also encouraged patient to sit in chair for meals throughout the shift  and after walk  But patient declined stating "I don't want to push myself too hard." No complaints of dizziness or dyspnea while walking. Continuing with current care plan and will update as necessary.

## 2020-10-06 NOTE — Progress Notes (Signed)
2 Days Post-Op Procedure(s) (LRB): RACICAL OVARIAN CANCER DEBULKING INCLUDING BILATERAL  SALPINGO OOPHORECTOMY AND TRACHELECTOMY (Bilateral) OMENTECTOMY (N/A) TUMOR DEBULKING (N/A)  Subjective: Patient reports mild right sided abdominal soreness. States when ambulating, the pain is aggravated. No nausea or emesis reported. Tolerating diet. No flatus or BM reported. Ambulating in the halls. No concerns voiced.     Objective: Vital signs in last 24 hours: Temp:  [97.8 F (36.6 C)-98.3 F (36.8 C)] 98.3 F (36.8 C) (01/06 0528) Pulse Rate:  [61-64] 64 (01/06 0528) Resp:  [14-17] 14 (01/06 0528) BP: (93-103)/(52-57) 103/57 (01/06 0528) SpO2:  [95 %-96 %] 96 % (01/06 0528) Last BM Date: 10/03/20  Intake/Output from previous day: 01/05 0701 - 01/06 0700 In: 1582.5 [P.O.:360; I.V.:1222.5] Out: 550 [Urine:550]  Physical Examination: General: alert, cooperative and no distress Resp: clear to auscultation bilaterally Cardio: regular rate and rhythm, S1, S2 normal, no murmur, click, rub or gallop GI: incision: midline abdominal incision with op site dressing in place with no drainage noted underneath and abdomen slightly distended and firm in the lower quadrants (more on the left), hypoactive bowel sounds, slightly tympanic Extremities: extremities normal, atraumatic, no cyanosis or edema  Labs: WBC/Hgb/Hct/Plts:  8.8/9.4/29.1/277 (01/05 0915) BUN/Cr/glu/ALT/AST/amyl/lip:  32/0.98/--/--/--/--/-- (01/05 0915)  Assessment: 80 y.o. 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  Heme: Pending labs from this am.  CV: BP and HR stable. Continue to monitor.  GI:  Tolerating po: Yes. Antiemetics ordered as needed.   GU: Voiding adequate amounts.     FEN: Am labs pending. No critical labs from 10/05/20 labs  Endo: Diabetes mellitus Type  II, under good control..  CBG:  CBG (last 3)  Recent Labs    10/05/20 1709 10/05/20 2145 10/06/20 0732  GLUCAP 70 110* 121*    Prophylaxis: SCDs and lovenox ordered  Plan: Continue plan of care Encourage ambulation, IS use Am labs pending   LOS: 2 days    Melissa D Cross 10/06/2020, 8:19 AM

## 2020-10-07 LAB — CBC
HCT: 26.7 % — ABNORMAL LOW (ref 36.0–46.0)
Hemoglobin: 8.4 g/dL — ABNORMAL LOW (ref 12.0–15.0)
MCH: 30.4 pg (ref 26.0–34.0)
MCHC: 31.5 g/dL (ref 30.0–36.0)
MCV: 96.7 fL (ref 80.0–100.0)
Platelets: 235 10*3/uL (ref 150–400)
RBC: 2.76 MIL/uL — ABNORMAL LOW (ref 3.87–5.11)
RDW: 16.4 % — ABNORMAL HIGH (ref 11.5–15.5)
WBC: 6.6 10*3/uL (ref 4.0–10.5)
nRBC: 0 % (ref 0.0–0.2)

## 2020-10-07 LAB — BASIC METABOLIC PANEL
Anion gap: 10 (ref 5–15)
BUN: 23 mg/dL (ref 8–23)
CO2: 24 mmol/L (ref 22–32)
Calcium: 8.4 mg/dL — ABNORMAL LOW (ref 8.9–10.3)
Chloride: 105 mmol/L (ref 98–111)
Creatinine, Ser: 1 mg/dL (ref 0.44–1.00)
GFR, Estimated: 57 mL/min — ABNORMAL LOW (ref >60)
Glucose, Bld: 74 mg/dL (ref 70–99)
Potassium: 3.4 mmol/L — ABNORMAL LOW (ref 3.5–5.1)
Sodium: 139 mmol/L (ref 135–145)

## 2020-10-07 LAB — GLUCOSE, CAPILLARY
Glucose-Capillary: 72 mg/dL (ref 70–99)
Glucose-Capillary: 128 mg/dL — ABNORMAL HIGH (ref 70–99)

## 2020-10-07 LAB — PHOSPHORUS: Phosphorus: 4 mg/dL (ref 2.5–4.6)

## 2020-10-07 LAB — MAGNESIUM: Magnesium: 2.4 mg/dL (ref 1.7–2.4)

## 2020-10-07 NOTE — Discharge Summary (Signed)
Physician Discharge Summary  Patient ID: Amber Gray MRN: [id number] DOB/AGE: [birth year] 80 y.o.  Admit date: 10/04/2020 Discharge date: 10/07/2020  Admission Diagnoses: Ovarian cancer [facility])  Discharge Diagnoses:  Principal Problem:   Ovarian cancer [facility])   Discharged Condition:  The patient is in good condition and stable for discharge.   Hospital Course: On 10/04/2020, the patient underwent the following: Procedure(s): RADICAL OVARIAN CANCER DEBULKING INCLUDING BILATERAL SALPINGO OOPHORECTOMY AND TRACHELECTOMY OMENTECTOMY TUMOR DEBULKING.  The postoperative course was uneventful.  She was discharged to home on postoperative day 3 tolerating a regular diet, voiding, pain controlled, passing flatus.  Consults: None  Significant Diagnostic Studies: None, labs performed  Treatments: surgery: see above  Discharge Exam: Blood pressure (!) 111/55, pulse (!) 51, temperature 97.8 F (36.6 C), temperature source Oral, resp. rate 14, height 5' (1.524 m), weight 113 lb 12.1 oz (51.6 kg), SpO2 97 %. General appearance: alert, cooperative and no distress Resp: clear to auscultation bilaterally Cardio: regular rate and rhythm, S1, S2 normal, no murmur, click, rub or gallop GI: abdomen soft, active bowel sounds Extremities: extremities normal, atraumatic, no cyanosis or edema Incision/Wound: Midline incision to the abdomen with op site dressing in place with no active drainage noted  Disposition: Discharge disposition: 01-Home or Self Care       Discharge Instructions    Call MD for:  difficulty breathing, headache or visual disturbances   Complete by: As directed    Call MD for:  extreme fatigue   Complete by: As directed    Call MD for:  hives   Complete by: As directed    Call MD for:  persistant dizziness or light-headedness   Complete by: As directed    Call MD for:  persistant nausea and vomiting   Complete by: As directed    Call MD for:  redness, tenderness, or signs  of infection (pain, swelling, redness, odor or green/yellow discharge around incision site)   Complete by: As directed    Call MD for:  severe uncontrolled pain   Complete by: As directed    Call MD for:  temperature >100.4   Complete by: As directed    Diet - low sodium heart healthy   Complete by: As directed    Discharge instructions   Complete by: As directed    YOU CAN RESUME YOUR ELIQUIS TOMORROW BUT ONLY TAKE ONE TABLET ONCE A DAY FOR 1 WEEK AFTER SURGERY. Starting Tuesday, Jan 11, you can begin taking the medication twice a day as previously prescribed.   Discharge wound care:   Complete by: As directed    You can remove the white honeycomb dressing over your incision 5 days after surgery and you do not need to reapply a new dressing   Driving Restrictions   Complete by: As directed    No driving for 2 weeks from surgery if you were cleared to drive before surgery.  Do not take narcotics and drive.   Increase activity slowly   Complete by: As directed    Lifting restrictions   Complete by: As directed    No lifting greater than 10 lbs.   Sexual Activity Restrictions   Complete by: As directed    No sexual activity, nothing in the vagina, for 6 weeks.     Allergies as of 10/07/2020      Reactions   Metformin And Related Other (See Comments)   Pain in legs      Medication List    STOP  taking these medications   apixaban 2.5 MG Tabs tablet Commonly known as: ELIQUIS     TAKE these medications   acetaminophen 325 MG tablet Commonly known as: TYLENOL Take 650 mg by mouth every 6 (six) hours as needed for mild pain.   albuterol 108 (90 Base) MCG/ACT inhaler Commonly known as: VENTOLIN HFA Inhale 2 puffs into the lungs every 6 (six) hours as needed for wheezing.   dexamethasone 4 MG tablet Commonly known as: DECADRON Take 2 tabs at the night before chemotherapy, every 3 weeks, by mouth x 6 cycles What changed:   how much to take  how to take this  when to take  this  additional instructions   empagliflozin 10 MG Tabs tablet Commonly known as: JARDIANCE Take 10 mg by mouth every other day.   furosemide 40 MG tablet Commonly known as: LASIX Take 40 mg by mouth daily.   glimepiride 1 MG tablet Commonly known as: AMARYL Take 1 mg by mouth daily.   levothyroxine 88 MCG tablet Commonly known as: SYNTHROID Take 88 mcg by mouth daily before breakfast.   lidocaine-prilocaine cream Commonly known as: EMLA Apply 1 application topically daily as needed.   Linzess 72 MCG capsule Generic drug: linaclotide Take 72 mcg by mouth daily as needed (Constipation).   melatonin 5 MG Tabs Take 5 mg by mouth at bedtime as needed (Sleep).   omeprazole 20 MG capsule Commonly known as: PRILOSEC Take 20 mg by mouth daily.   ondansetron 8 MG tablet Commonly known as: ZOFRAN Take 1 tablet (8 mg total) by mouth every 8 (eight) hours as needed for nausea.   prochlorperazine 10 MG tablet Commonly known as: COMPAZINE Take 1 tablet (10 mg total) by mouth every 6 (six) hours as needed for nausea or vomiting.   senna-docusate 8.6-50 MG tablet Commonly known as: Senokot-S Take 2 tablets by mouth at bedtime. For AFTER surgery, do not take if having diarrhea   sertraline 50 MG tablet Commonly known as: ZOLOFT Take 50 mg by mouth daily.   simvastatin 40 MG tablet Commonly known as: ZOCOR Take 40 mg by mouth daily.   Tradjenta 5 MG Tabs tablet Generic drug: linagliptin Take 5 mg by mouth daily.   traMADol 50 MG tablet Commonly known as: ULTRAM Take 1 tablet (50 mg total) by mouth every 6 (six) hours as needed for severe pain. For AFTER surgery only, do not take and drive            Discharge Care Instructions  (From admission, onward)         Start     Ordered   10/07/20 0000  Discharge wound care:       Comments: You can remove the white honeycomb dressing over your incision 5 days after surgery and you do not need to reapply a new dressing    10/07/20 1259          Follow-up Information    Lafonda Mosses, MD Follow up on 10/24/2020.   Specialty: Gynecologic Oncology Why: 1pm at the [facility] for post op check. Contact information: [place] [address] [phone]               Greater than thirty minutes were spend for face to face discharge instructions and discharge orders/summary in EPIC.   Signed: Dorothyann Gibbs 10/07/2020, 1:06 PM

## 2020-10-07 NOTE — Discharge Instructions (Addendum)
Since we discussed your final pathology this am, Dr. Berline Lopes has cancelled the phone call visit for Monday.  Closely monitor your blood sugar levels at home since it has been running on the lower end at times.   10/07/2020  Return to work: 4-6 weeks if applicable  Starting tomorrow, January 8, you can begin taking your ELIQUIS 2.5 tablet ONCE a day (not twice a day). After one week from surgery, starting January 11, you can increase your Eliquis back to your usual dosing of 2.5 MG twice a day.  5 days after surgery, you can remove the dressing over your incision and you will not need to reapply a new dressing. You have surgical glue on your incision that will peel off on its own.  Activity: 1. Be up and out of the bed during the day.  Take a nap if needed.  You may walk up steps but be careful and use the hand rail.  Stair climbing will tire you more than you think, you may need to stop part way and rest.   2. No lifting or straining for 6 weeks.  3. No driving for 2 week(s) from surgery if you were cleared to drive before.  Do not drive if you are taking narcotic pain medicine.  4. Shower daily.  Use soap and water not directly on your incision and pat dry; don't rub.  No tub baths until cleared by your surgeon.   5. No sexual activity and nothing in the vagina for 6 weeks.  6. You may experience a small amount of clear drainage from your incision, which is normal.  If the drainage persists or increases, please call the office.  7. You may experience vaginal spotting after surgery or around the 6-8 week mark from surgery when the stitches at the top of the vagina begin to dissolve.  The spotting is normal but if you experience heavy bleeding, call our office.  8. Take Tylenol or ibuprofen first for pain and only use narcotic pain medication for severe pain not relieved by the Tylenol or Ibuprofen.  Monitor your Tylenol intake to a max of 4,000 mg.  Diet: 1. Low sodium Heart Healthy Diet  is recommended.  2. It is safe to use a laxative, such as Miralax or Colace, if you have difficulty moving your bowels. You can take Sennakot at bedtime every evening to keep bowel movements regular and to prevent constipation.    Wound Care: 1. Keep clean and dry.  Shower daily.  Reasons to call the Doctor:  Fever - Oral temperature greater than 100.4 degrees Fahrenheit  Foul-smelling vaginal discharge  Difficulty urinating  Nausea and vomiting  Increased pain at the site of the incision that is unrelieved with pain medicine.  Difficulty breathing with or without chest pain  New calf pain especially if only on one side  Sudden, continuing increased vaginal bleeding with or without clots.   Contacts: For questions or concerns you should contact:  Dr. Jeral Pinch at [phone]  Joylene John, NP at [phone]  After Hours: call [phone] and have the GYN Oncologist paged/contacted   Hypoglycemia  Hypoglycemia is when the sugar (glucose) level in your blood is too low. Signs of low blood sugar may include:  Feeling: ? Hungry. ? Worried or nervous (anxious). ? Sweaty and clammy. ? Confused. ? Dizzy. ? Sleepy. ? Sick to your stomach (nauseous).  Having: ? A fast heartbeat. ? A headache. ? A change in your vision. ? Tingling or  no feeling (numbness) around your mouth, lips, or tongue. ? Jerky movements that you cannot control (seizure).  Having trouble with: ? Moving (coordination). ? Sleeping. ? Passing out (fainting). ? Getting upset easily (irritability). Low blood sugar can happen to people who have diabetes and people who do not have diabetes. Low blood sugar can happen quickly, and it can be an emergency. Treating low blood sugar Low blood sugar is often treated by eating or drinking something sugary right away, such as:  Fruit juice, 4-6 oz (120-150 mL).  Regular soda (not diet soda), 4-6 oz (120-150 mL).  Low-fat milk, 4 oz (120  mL).  Several pieces of hard candy.  Sugar or honey, 1 Tbsp (15 mL). Treating low blood sugar if you have diabetes If you can think clearly and swallow safely, follow the 15:15 rule:  Take 15 grams of a fast-acting carb (carbohydrate). Talk with your doctor about how much you should take.  Always keep a source of fast-acting carb with you, such as: ? Sugar tablets (glucose pills). Take 3-4 pills. ? 6-8 pieces of hard candy. ? 4-6 oz (120-150 mL) of fruit juice. ? 4-6 oz (120-150 mL) of regular (not diet) soda. ? 1 Tbsp (15 mL) honey or sugar.  Check your blood sugar 15 minutes after you take the carb.  If your blood sugar is still at or below 70 mg/dL (3.9 mmol/L), take 15 grams of a carb again.  If your blood sugar does not go above 70 mg/dL (3.9 mmol/L) after 3 tries, get help right away.  After your blood sugar goes back to normal, eat a meal or a snack within 1 hour.  Treating very low blood sugar If your blood sugar is at or below 54 mg/dL (3 mmol/L), you have very low blood sugar (severe hypoglycemia). This may also cause:  Passing out.  Jerky movements you cannot control (seizure).  Losing consciousness (coma). This is an emergency. Do not wait to see if the symptoms will go away. Get medical help right away. Call your local emergency services (911 in the [place] Do not drive yourself to the hospital. If you have very low blood sugar and you cannot eat or drink, you may need a glucagon shot (injection). A family member or friend should learn how to check your blood sugar and how to give you a glucagon shot. Ask your doctor if you need to have a glucagon shot kit at home. Follow these instructions at home: General instructions  Take over-the-counter and prescription medicines only as told by your doctor.  Stay aware of your blood sugar as told by your doctor.  Limit alcohol intake to no more than 1 drink a day for nonpregnant women and 2 drinks a day for men. One drink  equals 12 oz of beer (355 mL), 5 oz of wine (148 mL), or 1 oz of hard liquor (44 mL).  Keep all follow-up visits as told by your doctor. This is important. If you have diabetes:   Follow your diabetes care plan as told by your doctor. Make sure you: ? Know the signs of low blood sugar. ? Take your medicines as told. ? Follow your exercise and meal plan. ? Eat on time. Do not skip meals. ? Check your blood sugar as often as told by your doctor. Always check it before and after exercise. ? Follow your sick day plan when you cannot eat or drink normally. Make this plan ahead of time with your doctor.  Share your diabetes care plan with: ? Your work or school. ? People you live with.  Check your pee (urine) for ketones: ? When you are sick. ? As told by your doctor.  Carry a card or wear jewelry that says you have diabetes. Contact a doctor if:  You have trouble keeping your blood sugar in your target range.  You have low blood sugar often. Get help right away if:  You still have symptoms after you eat or drink something sugary.  Your blood sugar is at or below 54 mg/dL (3 mmol/L).  You have jerky movements that you cannot control.  You pass out. These symptoms may be an emergency. Do not wait to see if the symptoms will go away. Get medical help right away. Call your local emergency services (911 in the [place] Do not drive yourself to the hospital. Summary  Hypoglycemia happens when the level of sugar (glucose) in your blood is too low.  Low blood sugar can happen to people who have diabetes and people who do not have diabetes. Low blood sugar can happen quickly, and it can be an emergency.  Make sure you know the signs of low blood sugar and know how to treat it.  Always keep a source of sugar (fast-acting carb) with you to treat low blood sugar. This information is not intended to replace advice given to you by your health care provider. Make sure you discuss any  questions you have with your health care provider. Document Revised: 01/08/2019 Document Reviewed: 10/21/2015 Elsevier Patient Education  2020 [employer].

## 2020-10-07 NOTE — Care Management Important Message (Signed)
Important Message  Patient Details IM Letter given to the Patient. Name: Amber Gray MRN: [id number] Date of Birth: Aug 22, 1941   Medicare Important Message Given:  Yes     Kerin Salen 10/07/2020, 10:52 AM

## 2020-10-07 NOTE — Progress Notes (Signed)
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 Tolerated ambulating in the hall yesterday. No nausea or emesis reported. Tolerating diet. Passing flatus. No BM reported. No concerns voiced.     Objective: Vital signs in last 24 hours: Temp:  [97.8 F (36.6 C)-98.9 F (37.2 C)] 97.8 F (36.6 C) (01/07 0509) Pulse Rate:  [51-75] 51 (01/07 0509) Resp:  [14-16] 14 (01/07 0509) BP: (102-114)/(55-64) 111/55 (01/07 0509) SpO2:  [97 %-99 %] 97 % (01/07 0509) Weight:  [113 lb 12.1 oz (51.6 kg)] 113 lb 12.1 oz (51.6 kg) (01/07 0533) Last BM Date: 10/03/20  Intake/Output from previous day: 01/06 0701 - 01/07 0700 In: 538 [P.O.:538] Out: 1300 [Urine:1300]  Physical Examination: General: alert, cooperative and no distress Resp: clear to auscultation bilaterally Cardio: regular rate and rhythm, S1, S2 normal, no murmur, click, rub or gallop GI: incision: midline abdominal incision with op site dressing in place with no drainage noted underneath and abdomen less distended, active bowel sounds Extremities: extremities normal, atraumatic, no cyanosis or edema  Labs: WBC/Hgb/Hct/Plts:  6.6/8.4/26.7/235 (01/07 5465) BUN/Cr/glu/ALT/AST/amyl/lip:  23/1.00/--/--/--/--/-- (01/07 0354)  Assessment: 80 y.o. 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  Heme: Hgb 8.4 and Hct 26.7 this am-stable.  CV: BP and HR stable. Continue to monitor.  GI:  Tolerating po: Yes. Antiemetics ordered as needed.   GU: Voiding adequate amounts.     FEN: No critical labs from 10/07/20 labs  Endo: Diabetes mellitus Type II, under good control.  CBG:  CBG (last 3)  Recent Labs     10/06/20 1634 10/06/20 2112 10/07/20 0732  GLUCAP 81 78 72    Prophylaxis: SCDs and lovenox ordered  Plan: Plan to increase mobility Plan for possible discharge later today per Dr. Berline Lopes   LOS: 3 days    Amber Gray 10/07/2020, 9:29 AM

## 2020-10-10 ENCOUNTER — Telehealth: Payer: Self-pay

## 2020-10-10 ENCOUNTER — Ambulatory Visit: Payer: Medicare Other | Admitting: Gynecologic Oncology

## 2020-10-10 NOTE — Telephone Encounter (Signed)
Called patient, no answer or voicemail set up.  TC to [facility] [place], states patient does not always answer her phone d/t spam calls. Ms. Ardis Hughs stated she saw patient yesterday and she is doing as expected with some incision discomfort and swelling.  Ms. Ardis Hughs will call patient to ask her to answer.  Returned call to patient 10 minutes after initial call, still no answer.   1509-No answer to 3rd call.  1630-No answer to 4th call.

## 2020-10-21 ENCOUNTER — Encounter: Payer: Self-pay | Admitting: Gynecologic Oncology

## 2020-10-21 NOTE — Progress Notes (Signed)
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  CT imaging also revealed abdominal mass.  Subsequent biopsy confirmed diagnosis of ovarian cancer   05/21/2020 Procedure   Successful ultrasound guided diagnostic and therapeutic left thoracentesis yielding 580 cc of pleural fluid.   05/21/2020 Imaging   1. Large saddle pulmonary embolus with CT evidence of right heart strain (RV/LV ratio of 1.6) consistent with at least submassive (intermediate risk) PE. The presence of right heart strain has been associated with an increased risk of morbidity and mortality. 2. Large left and small right pleural effusions. 3. Large amount of peritoneal nodularity, consistent with peritoneal carcinomatosis. 4. Large midline pelvic mass, measuring up to 12.9 x 8.7 x 9.7 cm. 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   05/21/2020 Pathology Results   Clinical History: None provided  Specimen Submitted:  A. PLEURAL FLUID,LEFT, THORACENTESIS:    FINAL MICROSCOPIC DIAGNOSIS:  - Reactive mesothelial cells present   05/24/2020 Procedure   1. Successful placement of a right internal jugular approach power injectable Port-A-Cath. The catheter is ready for immediate use. 2. Successful ultrasound-guided paracentesis yielding 2.2 L of serous, slightly blood tinged ascitic fluid.   05/24/2020 Pathology Results   A. OMENTUM, NEEDLE CORE BIOPSY:  -  High-grade carcinoma  -  See comment   COMMENT:   By immunohistochemistry, the neoplastic cells are positive for cytokeratin 7 and PAX 8 but negative  for cytokeratin 20, CDX2 and TTF-1. 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    05/30/2020 Cancer Staging   Staging form: Ovary, Fallopian Tube, and Primary Peritoneal Carcinoma, AJCC 8th Edition - Clinical stage from 05/30/2020: FIGO Stage IIIC (cT3, cN0, cM0) - Signed by [name], MD on 05/30/2020   06/10/2020 Tumor Marker   Patient's tumor was tested for the following markers: CA-125. Results of the tumor marker test revealed 27,432.   06/14/2020 -  Chemotherapy   The patient had carboplatin and taxol for chemotherapy treatment.     07/04/2020 Tumor Marker   Patient's tumor was tested for the following markers: CA-125 Results of the tumor marker test revealed [id number]   08/29/2020 Imaging   CT chest:   1. No evidence of thoracic metastasis. 2. Resolution of proximal pulmonary emboli.   CT abdomen pelvis:   1. Marked reduction in volume of bulky pelvic mass. Nodularity remains along the uterine fundus. 2. Marked reduction in the nodular portions of the omental thickening. Cystic portions of the omental metastasis remain. 3. Decrease in volume of free fluid in the abdomen pelvis. 4. No new nodularity in the peritoneal space.     08/30/2020 Tumor Marker   Patient's tumor was tested for the following markers: CA-125 Results of the tumor marker test revealed 359     Interval History: Patient reports doing well since surgery.  She endorses having a good appetite without any nausea or emesis.  She reports regular bowel function although is having diarrhea today.  She is still using medication to help keep her bowels regular.  She denies any urinary symptoms.  She denies  any significant abdominal or pelvic pain.  She denies vaginal bleeding or discharge.  Past Medical/Surgical History: Past Medical History:  Diagnosis Date  . Anxiety   . Arthritis    "all over my body" (05/27/2017)  . Chronic lower back  pain   . Depression   . Dyspnea    allergy related SOB  . Frequent UTI   . GERD (gastroesophageal reflux disease)   . History of blood transfusion    "when I had my neck fusion"  . History of hiatal hernia   . Hypercholesteremia   . Hypertension   . Hypothyroid   . Pneumonia 2000s X 1; 05/27/2017  . PONV (postoperative nausea and vomiting)   . Seasonal allergies   . Type II diabetes mellitus ([place])     Past Surgical History:  Procedure Laterality Date  . ANKLE ARTHROSCOPY Right 03/29/2020   Procedure: RIGHT ANKLE ARTHROSCOPY AND DEBRIDEMENT;  Surgeon: Newt Minion, MD;  Location: [facility];  Service: Orthopedics;  Laterality: Right;  . BACK SURGERY    . BREAST BIOPSY Left   . CATARACT EXTRACTION W/ INTRAOCULAR LENS  IMPLANT, BILATERAL Bilateral   . COLONOSCOPY  10/25/2005   normal  . DEBULKING N/A 10/04/2020   Procedure: TUMOR DEBULKING;  Surgeon: Lafonda Mosses, MD;  Location: [facility];  Service: Gynecology;  Laterality: N/A;  . DILATION AND CURETTAGE OF UTERUS    . IR IMAGING GUIDED PORT INSERTION  05/24/2020  . IR PARACENTESIS  05/24/2020  . OMENTECTOMY N/A 10/04/2020   Procedure: OMENTECTOMY;  Surgeon: Lafonda Mosses, MD;  Location: [facility];  Service: Gynecology;  Laterality: N/A;  . PARTIAL HYSTERECTOMY  1968  . POSTERIOR CERVICAL FUSION/FORAMINOTOMY     "fused 4,5,6"  . SALPINGOOPHORECTOMY Bilateral 10/04/2020   Procedure: RACICAL OVARIAN CANCER DEBULKING INCLUDING BILATERAL  SALPINGO OOPHORECTOMY AND TRACHELECTOMY;  Surgeon: Lafonda Mosses, MD;  Location: [facility];  Service: Gynecology;  Laterality: Bilateral;  . TONSILLECTOMY      Family History  Problem Relation Age of Onset  . Alzheimer's disease Mother   . Heart attack Father   . Heart disease Father   . Dwarfism Sister   . Colon cancer Neg Hx   . Colon polyps Neg Hx   . Esophageal cancer Neg Hx   . Rectal cancer Neg Hx   . Stomach cancer Neg Hx     Social History   Socioeconomic  History  . Marital status: Divorced    Spouse name: Not on file  . Number of children: Not on file  . Years of education: Not on file  . Highest education level: Not on file  Occupational History  . Not on file  Tobacco Use  . Smoking status: Former Smoker    Packs/day: 1.00    Years: 6.00    Pack years: 6.00    Types: Cigarettes    Quit date: 10/01/1964    Years since quitting: 56.1  . Smokeless tobacco: Never Used  Vaping Use  . Vaping Use: Never used  Substance and Sexual Activity  . Alcohol use: No  . Drug use: No  . Sexual activity: Not Currently  Other Topics Concern  . Not on file  Social History Narrative  . Not on file   Social Determinants of Health   Financial Resource Strain: Not on file  Food Insecurity: Not on file  Transportation Needs: Not on file  Physical Activity: Not on file  Stress: Not on file  Social Connections:  Not on file    Current Medications:  Current Outpatient Medications:  .  acetaminophen (TYLENOL) 325 MG tablet, Take 650 mg by mouth every 6 (six) hours as needed for mild pain., Disp: , Rfl:  .  empagliflozin (JARDIANCE) 10 MG TABS tablet, Take 10 mg by mouth every other day., Disp: , Rfl:  .  furosemide (LASIX) 40 MG tablet, Take 40 mg by mouth daily. , Disp: , Rfl: 11 .  glimepiride (AMARYL) 1 MG tablet, Take 1 mg by mouth daily. , Disp: , Rfl:  .  levothyroxine (SYNTHROID) 88 MCG tablet, Take 88 mcg by mouth daily before breakfast. , Disp: , Rfl:  .  LINZESS 72 MCG capsule, Take 72 mcg by mouth daily as needed (Constipation)., Disp: , Rfl:  .  melatonin 5 MG TABS, Take 5 mg by mouth at bedtime as needed (Sleep)., Disp: , Rfl:  .  senna-docusate (SENOKOT-S) 8.6-50 MG tablet, Take 2 tablets by mouth at bedtime. For AFTER surgery, do not take if having diarrhea, Disp: 30 tablet, Rfl: 0 .  sertraline (ZOLOFT) 50 MG tablet, Take 50 mg by mouth daily., Disp: , Rfl: 0 .  simvastatin (ZOCOR) 40 MG tablet, Take 40 mg by mouth daily., Disp: ,  Rfl: 3 .  TRADJENTA 5 MG TABS tablet, Take 5 mg by mouth daily after supper., Disp: , Rfl: 3 .  albuterol (PROVENTIL HFA;VENTOLIN HFA) 108 (90 BASE) MCG/ACT inhaler, Inhale 2 puffs into the lungs every 6 (six) hours as needed for wheezing. (Patient not taking: Reported on 10/21/2020), Disp: , Rfl:  .  dexamethasone (DECADRON) 4 MG tablet, Take 2 tabs at the night before chemotherapy, every 3 weeks, by mouth x 6 cycles (Patient not taking: Reported on 10/21/2020), Disp: 12 tablet, Rfl: 0 .  lidocaine-prilocaine (EMLA) cream, Apply 1 application topically daily as needed. (Patient not taking: Reported on 10/21/2020), Disp: 30 g, Rfl: 11 .  omeprazole (PRILOSEC) 20 MG capsule, Take 20 mg by mouth daily. (Patient not taking: Reported on 10/21/2020), Disp: , Rfl:  .  ondansetron (ZOFRAN) 8 MG tablet, Take 1 tablet (8 mg total) by mouth every 8 (eight) hours as needed for nausea. (Patient not taking: Reported on 10/21/2020), Disp: 30 tablet, Rfl: 3 .  prochlorperazine (COMPAZINE) 10 MG tablet, Take 1 tablet (10 mg total) by mouth every 6 (six) hours as needed for nausea or vomiting. (Patient not taking: No sig reported), Disp: 30 tablet, Rfl: 1  Review of Systems: Denies appetite changes, fevers, chills, fatigue, unexplained weight changes. Denies hearing loss, neck lumps or masses, mouth sores, ringing in ears or voice changes. Denies cough or wheezing.  Denies shortness of breath. Denies chest pain or palpitations. Denies leg swelling. Denies abdominal distention, pain, blood in stools, constipation, nausea, vomiting, or early satiety. Denies pain with intercourse, dysuria, frequency, hematuria or incontinence. Denies hot flashes, pelvic pain, vaginal bleeding or vaginal discharge.   Denies joint pain, back pain or muscle pain/cramps. Denies itching, rash, or wounds. Denies dizziness, headaches, numbness or seizures. Denies swollen lymph nodes or glands, denies easy bruising or bleeding. Denies anxiety,  depression, confusion, or decreased concentration.  Physical Exam: BP 129/61 (BP Location: Left Arm, Patient Position: Sitting)   Pulse 60   Temp 97.6 F (36.4 C) (Oral)   Resp 18   Ht 5' (1.524 m)   Wt 104 lb (47.2 kg)   SpO2 100% Comment: RA  BMI 20.31 kg/m  General: Alert, oriented, no acute distress. HEENT: Normocephalic, atraumatic, sclera anicteric.  Chest: Unlabored breathing on room air. Abdomen: soft, nontender.  Normoactive bowel sounds.  No masses or hepatosplenomegaly appreciated.  Well-healed laparotomy. Extremities: Grossly normal range of motion.  Warm, well perfused.  No edema bilaterally. Skin: No rashes or lesions noted. GU: Normal appearing external genitalia without erythema, excoriation, or lesions.  Speculum exam reveals atrophic vaginal mucosa, cuff intact, no bleeding, sutures visible.  Bimanual exam reveals cuff intact, no fluctuance.  Laboratory & Radiologic Studies: A. OMENTUM, OMENTECTOMY:  - Residual high grade serous carcinoma status post neoadjuvant  treatment.   B. UTERUS, CERVIX, BILATERAL FALLOPIAN TUBES AND OVARIES AND RECTAL  IMPLANT, TOTAL HYSTERECTOMY AND BILATERAL SALPINGO-OOPHORECTOMY AND  EXCISION OF RECTAL IMPLANT:  - Residual high grade serous carcinoma status post neoadjuvant  treatment.  - Tumor involves left ovary, right fallopian tube, uterine serosa,  uterine wall (myometrium) and rectal implant.  - See oncology table.   ONCOLOGY TABLE:   OVARY or FALLOPIAN TUBE or PRIMARY PERITONEUM: Resection   Procedure: Total hysterectomy and bilateral salpingo-oophorectomy,  Excision of rectal implant  Specimen Integrity: Intact  Tumor Site: Left ovary  Tumor Size: Multifocal, largest focus measures 2.1 cm  Histologic Type: Serous carcinoma  Histologic Grade: High grade  Ovarian Surface Involvement: Present, left ovary  Fallopian Tube Surface Involvement: Present, right fallopian tube  Implants: Present, rectum  Other Tissue/ Organ  Involvement: Uterine serosa, uterine wall  (myometrium)  Largest Extrapelvic Peritoneal Focus: 5 cm  Peritoneal/Ascitic Fluid Involvement: Not applicable  Chemotherapy Response Score (CRS): CRS2 (moderate response)  Regional Lymph Nodes: Not applicable (no regional lymph nodes submitted  or found)  Distant Metastasis:    Distant Site(s) Involved: Not applicable  Pathologic Stage Classification (pTNM, AJCC 8th Edition): ypT3c, pN not  assigned  Additional Findings: Mild acute and chronic cervicitis. Attenuated  inactive endometrium with scattered cystically dilated glands.  Leiomyomata.  Ancillary Studies: Can be performed upon request  Representative Tumor Block: A5  Comment: Dr. Melina Copa reviewed select slides.  Assessment & Plan: Amber Gray is a 80 y.o. 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  Patient is doing very well from a postoperative standpoint and meeting all milestones.  We discussed continued activity restrictions until she is 6 weeks out from surgery.  Discussed expectations as she continues healing.  I discussed genetic testing today.  Patient is understanding of my recommendation and amenable to me placing a referral.  We discussed the importance of this both for her as well as her family members.  We also discussed that this may have implications for her ovarian cancer treatment.  Patient will see Dr. Alvy Bimler next week for reinitiation of treatment.  22 minutes of total time was spent for this patient encounter, including preparation, face-to-face counseling with the patient and coordination of care, and documentation of the encounter.  Jeral Pinch, MD  Division of Gynecologic Oncology  Department of Obstetrics and Gynecology  [facility]

## 2020-10-24 ENCOUNTER — Other Ambulatory Visit: Payer: Self-pay

## 2020-10-24 ENCOUNTER — Inpatient Hospital Stay: Payer: Medicare Other | Attending: Hematology and Oncology | Admitting: Gynecologic Oncology

## 2020-10-24 ENCOUNTER — Encounter: Payer: Self-pay | Admitting: Oncology

## 2020-10-24 ENCOUNTER — Encounter: Payer: Self-pay | Admitting: Gynecologic Oncology

## 2020-10-24 ENCOUNTER — Other Ambulatory Visit: Payer: Self-pay | Admitting: Hematology and Oncology

## 2020-10-24 VITALS — BP 129/61 | HR 60 | Temp 97.6°F | Resp 18 | Ht 60.0 in | Wt 104.0 lb

## 2020-10-24 DIAGNOSIS — C569 Malignant neoplasm of unspecified ovary: Secondary | ICD-10-CM

## 2020-10-24 DIAGNOSIS — C7982 Secondary malignant neoplasm of genital organs: Secondary | ICD-10-CM

## 2020-10-24 DIAGNOSIS — C786 Secondary malignant neoplasm of retroperitoneum and peritoneum: Secondary | ICD-10-CM

## 2020-10-24 DIAGNOSIS — C562 Malignant neoplasm of left ovary: Secondary | ICD-10-CM

## 2020-10-24 NOTE — Patient Instructions (Signed)
You are healing very well from surgery! Remember, lifting restrictions are in place until you are 6 weeks out from surgery. And nothing in the vagina until 8 weeks after surgery. I will see you back once you have finished your chemotherapy, alternating visits with Dr. Alvy Bimler.

## 2020-10-24 NOTE — Progress Notes (Signed)
Met with Amber Gray and her daughter after her post op appointment with Dr. Berline Lopes.  Went over plan to continue chemotherapy and for genetic counseling.  Advised them that the schedulers will call with an appointment to see Dr. Alvy Bimler on 2/3 and for chemo on 2/4.

## 2020-10-26 ENCOUNTER — Telehealth: Payer: Self-pay | Admitting: Hematology and Oncology

## 2020-10-26 NOTE — Telephone Encounter (Signed)
Called pt per 1/24 sch msg - no answer - no voicemail set up - message sent to [facility]

## 2020-11-02 ENCOUNTER — Encounter: Payer: Self-pay | Admitting: Genetic Counselor

## 2020-11-02 ENCOUNTER — Telehealth: Payer: Self-pay | Admitting: Hematology and Oncology

## 2020-11-02 ENCOUNTER — Encounter: Payer: Self-pay | Admitting: Hematology and Oncology

## 2020-11-02 ENCOUNTER — Other Ambulatory Visit: Payer: Self-pay

## 2020-11-02 ENCOUNTER — Inpatient Hospital Stay: Payer: Medicare Other | Attending: Genetic Counselor | Admitting: Genetic Counselor

## 2020-11-02 ENCOUNTER — Inpatient Hospital Stay: Payer: Medicare Other

## 2020-11-02 ENCOUNTER — Inpatient Hospital Stay (HOSPITAL_BASED_OUTPATIENT_CLINIC_OR_DEPARTMENT_OTHER): Payer: Medicare Other | Admitting: Hematology and Oncology

## 2020-11-02 DIAGNOSIS — C569 Malignant neoplasm of unspecified ovary: Secondary | ICD-10-CM

## 2020-11-02 DIAGNOSIS — Z5111 Encounter for antineoplastic chemotherapy: Secondary | ICD-10-CM | POA: Diagnosis present

## 2020-11-02 DIAGNOSIS — N183 Chronic kidney disease, stage 3 unspecified: Secondary | ICD-10-CM | POA: Diagnosis not present

## 2020-11-02 DIAGNOSIS — D61818 Other pancytopenia: Secondary | ICD-10-CM

## 2020-11-02 DIAGNOSIS — R634 Abnormal weight loss: Secondary | ICD-10-CM

## 2020-11-02 DIAGNOSIS — C562 Malignant neoplasm of left ovary: Secondary | ICD-10-CM | POA: Insufficient documentation

## 2020-11-02 DIAGNOSIS — I2692 Saddle embolus of pulmonary artery without acute cor pulmonale: Secondary | ICD-10-CM

## 2020-11-02 LAB — CBC WITH DIFFERENTIAL (CANCER CENTER ONLY)
Abs Immature Granulocytes: 0.01 10*3/uL (ref 0.00–0.07)
Basophils Absolute: 0.1 10*3/uL (ref 0.0–0.1)
Basophils Relative: 1 %
Eosinophils Absolute: 0.2 10*3/uL (ref 0.0–0.5)
Eosinophils Relative: 3 %
HCT: 32.7 % — ABNORMAL LOW (ref 36.0–46.0)
Hemoglobin: 10.5 g/dL — ABNORMAL LOW (ref 12.0–15.0)
Immature Granulocytes: 0 %
Lymphocytes Relative: 29 %
Lymphs Abs: 1.7 10*3/uL (ref 0.7–4.0)
MCH: 29.2 pg (ref 26.0–34.0)
MCHC: 32.1 g/dL (ref 30.0–36.0)
MCV: 91.1 fL (ref 80.0–100.0)
Monocytes Absolute: 0.5 10*3/uL (ref 0.1–1.0)
Monocytes Relative: 9 %
Neutro Abs: 3.5 10*3/uL (ref 1.7–7.7)
Neutrophils Relative %: 58 %
Platelet Count: 214 10*3/uL (ref 150–400)
RBC: 3.59 MIL/uL — ABNORMAL LOW (ref 3.87–5.11)
RDW: 14.3 % (ref 11.5–15.5)
WBC Count: 6 10*3/uL (ref 4.0–10.5)
nRBC: 0 % (ref 0.0–0.2)

## 2020-11-02 LAB — CMP (CANCER CENTER ONLY)
ALT: 16 U/L (ref 0–44)
AST: 20 U/L (ref 15–41)
Albumin: 3.8 g/dL (ref 3.5–5.0)
Alkaline Phosphatase: 80 U/L (ref 38–126)
Anion gap: 10 (ref 5–15)
BUN: 21 mg/dL (ref 8–23)
CO2: 29 mmol/L (ref 22–32)
Calcium: 9.5 mg/dL (ref 8.9–10.3)
Chloride: 103 mmol/L (ref 98–111)
Creatinine: 0.98 mg/dL (ref 0.44–1.00)
GFR, Estimated: 58 mL/min — ABNORMAL LOW (ref >60)
Glucose, Bld: 126 mg/dL — ABNORMAL HIGH (ref 70–99)
Potassium: 3.7 mmol/L (ref 3.5–5.1)
Sodium: 142 mmol/L (ref 135–145)
Total Bilirubin: 0.4 mg/dL (ref 0.3–1.2)
Total Protein: 8.4 g/dL — ABNORMAL HIGH (ref 6.5–8.1)

## 2020-11-02 NOTE — Assessment & Plan Note (Signed)
The patient has complete resection with no residual disease She is recovering well from treatment, albeit with some profound weight loss We will proceed with treatment as scheduled this week I will adjust the dose of her chemotherapy accordingly For her next treatment, the patient will be going out of town and I will delay her next cycle a little bit per patient request I recommend 3 more cycles of treatment after surgery for adjuvant reason She has seen genetic counselor today We reviewed results of genetic testing in her next visit

## 2020-11-02 NOTE — Telephone Encounter (Signed)
Scheduled appts per 2/2 sch msg. Gave pt a print out of AVS.

## 2020-11-02 NOTE — Progress Notes (Signed)
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 She is recovering well from treatment, albeit with some profound weight loss We will proceed with treatment as scheduled this week I will adjust the dose of her chemotherapy accordingly For her next treatment, the patient will be going out of town and I will delay her next cycle a little bit per patient request I recommend 3 more cycles of treatment after surgery for adjuvant reason She has seen genetic counselor today We reviewed results of genetic testing in her next visit  Weight loss She has recent profound weight loss We discussed the importance of frequent oral intake I will adjust the dose of her chemotherapy accordingly  Pancytopenia, acquired ([place]) Her pancytopenia has improved We will observe closely We will continue dose adjustment as before  CKD (chronic kidney disease), stage III Renal function is stable I will adjust the dose of carboplatin accordingly  Acute pulmonary embolism ([place]) She has resume anticoagulation therapy She needs to be on anticoagulation therapy for minimum 1 year   No orders of the defined types were placed in this encounter.   All questions were answered. The patient knows to call the clinic with any problems, questions or concerns. The total time spent in the appointment was 40 minutes encounter with patients including review of chart and various tests results, discussions about plan of care and coordination of care plan   [name], MD 11/02/2020 3:34 PM  INTERVAL HISTORY: Please see below for problem oriented charting. She returns with her daughter for further follow-up She is seen after recent surgery, to resume chemotherapy She is recovering well, although she has lost quite a bit of weight since last time I  saw her Denies recent nausea or constipation She denies much postsurgical pain The patient denies any recent signs or symptoms of bleeding such as spontaneous epistaxis, hematuria or hematochezia. 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  CT imaging also revealed abdominal mass.  Subsequent biopsy confirmed diagnosis of ovarian cancer   05/21/2020 Procedure   Successful ultrasound guided diagnostic and therapeutic left thoracentesis yielding 580 cc of pleural fluid.   05/21/2020 Imaging   1. Large saddle pulmonary embolus with CT evidence of right heart strain (RV/LV ratio of 1.6) consistent with at least submassive (intermediate risk) PE. The presence of right heart strain has been associated with an increased risk of morbidity and mortality. 2. Large left and small right pleural effusions. 3. Large amount of peritoneal nodularity, consistent with peritoneal carcinomatosis. 4. Large midline pelvic mass, measuring up to 12.9 x 8.7 x 9.7 cm. 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   05/21/2020 Pathology Results   Clinical History: None provided  Specimen Submitted:  A. PLEURAL FLUID,LEFT, THORACENTESIS:    FINAL MICROSCOPIC DIAGNOSIS:  - Reactive mesothelial cells present   05/24/2020 Procedure   1. Successful placement of a right internal jugular approach power injectable Port-A-Cath. The catheter is ready for immediate use. 2. Successful  ultrasound-guided paracentesis yielding 2.2 L of serous, slightly blood tinged ascitic fluid.   05/24/2020 Pathology Results   A. OMENTUM, NEEDLE CORE BIOPSY:  -  High-grade  carcinoma  -  See comment   COMMENT:   By immunohistochemistry, the neoplastic cells are positive for cytokeratin 7 and PAX 8 but negative for cytokeratin 20, CDX2 and TTF-1. 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    05/30/2020 Cancer Staging   Staging form: Ovary, Fallopian Tube, and Primary Peritoneal Carcinoma, AJCC 8th Edition - Clinical stage from 05/30/2020: FIGO Stage IIIC (cT3, cN0, cM0) - Signed by [name], MD on 05/30/2020   06/10/2020 Tumor Marker   Patient's tumor was tested for the following markers: CA-125. Results of the tumor marker test revealed 27,432.   06/14/2020 -  Chemotherapy   The patient had carboplatin and taxol for chemotherapy treatment.     07/04/2020 Tumor Marker   Patient's tumor was tested for the following markers: CA-125 Results of the tumor marker test revealed [id number]   08/29/2020 Imaging   CT chest:   1. No evidence of thoracic metastasis. 2. Resolution of proximal pulmonary emboli.   CT abdomen pelvis:   1. Marked reduction in volume of bulky pelvic mass. Nodularity remains along the uterine fundus. 2. Marked reduction in the nodular portions of the omental thickening. Cystic portions of the omental metastasis remain. 3. Decrease in volume of free fluid in the abdomen pelvis. 4. No new nodularity in the peritoneal space.     08/30/2020 Tumor Marker   Patient's tumor was tested for the following markers: CA-125 Results of the tumor marker test revealed [id number]   10/04/2020 Surgery   Operation: Exlap, radical tumor debulking including omentectomy, BSO, trachelectomy   Surgeon: [name]    Operative Findings: On EUA, cervix not definitively appreciated. Nodularity and somewhat fixed mass noted in the cul de sac. On intra-abdominal entry, normal and smooth diaphragm, stomach, liver edge. Omentum densely adherent to the transverse colon with two 3-5cm cystic  masses vs treated tumor within the infracolic portion of the mass.  One of these masses was densely adherent to the anterior abdominal wall in the left upper abdomen.  Otherwise, omentum without evidence of disease. Small bowel normal in appearance. No adenopathy. No ascites. Bilateral tubes normal appearing. Ovaries in conglomerate mass with cervix. Mass densely adherent to the rectum posteriorly. Bladder adherent anteriorly to the cervix and superior to the cervix/ovarian mass conglomeration. After resection of the cervix and bilateral adnexa, the remaining nodules were able to be removed from the rectum.  Bubble test was negative for disruption of the rectum.  No palpable or visible tumor at the end of surgery (R0 resection)     10/04/2020 Pathology Results   SURGICAL PATHOLOGY  CASE: [id number]  PATIENT: Amber Gray  Surgical Pathology Report   FINAL MICROSCOPIC DIAGNOSIS:   A. 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   B. 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  - Tumor involves left ovary, right fallopian tube, uterine serosa, uterine wall (myometrium) and rectal implant.  - See oncology table.   ONCOLOGY TABLE:   OVARY or FALLOPIAN TUBE or PRIMARY PERITONEUM: Resection   Procedure: Total hysterectomy and bilateral salpingo-oophorectomy,  Excision of rectal implant  Specimen  Integrity: Intact  Tumor Site: Left ovary  Tumor Size: Multifocal, largest focus measures 2.1 cm  Histologic Type: Serous carcinoma  Histologic Grade: High grade  Ovarian Surface Involvement: Present, left ovary  Fallopian Tube Surface Involvement: Present, right fallopian tube  Implants: Present, rectum  Other Tissue/ Organ Involvement: Uterine serosa,  uterine wall  (myometrium)  Largest Extrapelvic Peritoneal Focus: 5 cm  Peritoneal/Ascitic Fluid Involvement: Not applicable  Chemotherapy Response Score (CRS): CRS2 (moderate response)  Regional Lymph Nodes: Not applicable (no regional lymph nodes submitted  or found)  Distant Metastasis:       Distant Site(s) Involved: Not applicable  Pathologic Stage Classification (pTNM, AJCC 8th Edition): ypT3c, pN not assigned  Additional Findings: Mild acute and chronic cervicitis.  Attenuated inactive endometrium with scattered cystically dilated glands.  Leiomyomata.      REVIEW OF SYSTEMS:   Constitutional: Denies fevers, chills  Eyes: Denies blurriness of vision Ears, nose, mouth, throat, and face: Denies mucositis or sore throat Respiratory: Denies cough, dyspnea or wheezes Cardiovascular: Denies palpitation, chest discomfort or lower extremity swelling Gastrointestinal:  Denies nausea, heartburn or change in bowel habits Skin: Denies abnormal skin rashes Lymphatics: Denies new lymphadenopathy or easy bruising Neurological:Denies numbness, tingling or new weaknesses Behavioral/Psych: Mood is stable, no new changes  All other systems were reviewed with the patient and are negative.  I have reviewed the past medical history, past surgical history, social history and family history with the patient and they are unchanged from previous note.  ALLERGIES:  is allergic to metformin and related.  MEDICATIONS:  Current Outpatient Medications  Medication Sig Dispense Refill  . acetaminophen (TYLENOL) 325 MG tablet Take 650 mg by mouth every 6 (six) hours as needed for mild pain.    Marland Kitchen albuterol (PROVENTIL HFA;VENTOLIN HFA) 108 (90 BASE) MCG/ACT inhaler Inhale 2 puffs into the lungs every 6 (six) hours as needed for wheezing. (Patient not taking: Reported on 10/21/2020)    . dexamethasone (DECADRON) 4 MG tablet Take 2 tabs at the night before chemotherapy, every 3 weeks, by mouth x 6 cycles  (Patient not taking: Reported on 10/21/2020) 12 tablet 0  . empagliflozin (JARDIANCE) 10 MG TABS tablet Take 10 mg by mouth every other day.    . furosemide (LASIX) 40 MG tablet Take 40 mg by mouth daily.   11  . glimepiride (AMARYL) 1 MG tablet Take 1 mg by mouth daily.     Marland Kitchen levothyroxine (SYNTHROID) 88 MCG tablet Take 88 mcg by mouth daily before breakfast.     . lidocaine-prilocaine (EMLA) cream Apply 1 application topically daily as needed. (Patient not taking: Reported on 10/21/2020) 30 g 11  . LINZESS 72 MCG capsule Take 72 mcg by mouth daily as needed (Constipation).    . melatonin 5 MG TABS Take 5 mg by mouth at bedtime as needed (Sleep).    Marland Kitchen omeprazole (PRILOSEC) 20 MG capsule Take 20 mg by mouth daily. (Patient not taking: Reported on 10/21/2020)    . ondansetron (ZOFRAN) 8 MG tablet Take 1 tablet (8 mg total) by mouth every 8 (eight) hours as needed for nausea. (Patient not taking: Reported on 10/21/2020) 30 tablet 3  . prochlorperazine (COMPAZINE) 10 MG tablet Take 1 tablet (10 mg total) by mouth every 6 (six) hours as needed for nausea or vomiting. (Patient not taking: No sig reported) 30 tablet 1  . senna-docusate (SENOKOT-S) 8.6-50 MG tablet Take 2 tablets by mouth at bedtime. For AFTER surgery, do not take if having diarrhea  30 tablet 0  . sertraline (ZOLOFT) 50 MG tablet Take 50 mg by mouth daily.  0  . simvastatin (ZOCOR) 40 MG tablet Take 40 mg by mouth daily.  3  . TRADJENTA 5 MG TABS tablet Take 5 mg by mouth daily after supper.  3   No current facility-administered medications for this visit.    PHYSICAL EXAMINATION: ECOG PERFORMANCE STATUS: 2 - Symptomatic, <50% confined to bed  Vitals:   11/02/20 1448  BP: (!) 124/41  Pulse: (!) 52  Resp: 15  Temp: (!) 97.3 F (36.3 C)  SpO2: 100%   Filed Weights   11/02/20 1448  Weight: 100 lb (45.4 kg)    GENERAL:alert, no distress and comfortable SKIN: skin color, texture, turgor are normal, no rashes or significant  lesions EYES: normal, Conjunctiva are pink and non-injected, sclera clear OROPHARYNX:no exudate, no erythema and lips, buccal mucosa, and tongue normal  NECK: supple, thyroid normal size, non-tender, without nodularity LYMPH:  no palpable lymphadenopathy in the cervical, axillary or inguinal LUNGS: clear to auscultation and percussion with normal breathing effort HEART: regular rate & rhythm and no murmurs and no lower extremity edema ABDOMEN:abdomen soft, non-tender and normal bowel sounds.  Noted well-healed surgical scar Musculoskeletal:no cyanosis of digits and no clubbing  NEURO: alert & oriented x 3 with fluent speech, no focal motor/sensory deficits  LABORATORY DATA:  I have reviewed the data as listed    Component Value Date/Time   NA 142 11/02/2020 1417   K 3.7 11/02/2020 1417   CL 103 11/02/2020 1417   CO2 29 11/02/2020 1417   GLUCOSE 126 (H) 11/02/2020 1417   BUN 21 11/02/2020 1417   CREATININE 0.98 11/02/2020 1417   CALCIUM 9.5 11/02/2020 1417   PROT 8.4 (H) 11/02/2020 1417   ALBUMIN 3.8 11/02/2020 1417   AST 20 11/02/2020 1417   ALT 16 11/02/2020 1417   ALKPHOS 80 11/02/2020 1417   BILITOT 0.4 11/02/2020 1417   GFRNONAA 58 (L) 11/02/2020 1417   GFRAA 59 (L) 07/04/2020 1217    No results found for: SPEP, UPEP  Lab Results  Component Value Date   WBC 6.0 11/02/2020   NEUTROABS 3.5 11/02/2020   HGB 10.5 (L) 11/02/2020   HCT 32.7 (L) 11/02/2020   MCV 91.1 11/02/2020   PLT 214 11/02/2020      Chemistry      Component Value Date/Time   NA 142 11/02/2020 1417   K 3.7 11/02/2020 1417   CL 103 11/02/2020 1417   CO2 29 11/02/2020 1417   BUN 21 11/02/2020 1417   CREATININE 0.98 11/02/2020 1417      Component Value Date/Time   CALCIUM 9.5 11/02/2020 1417   ALKPHOS 80 11/02/2020 1417   AST 20 11/02/2020 1417   ALT 16 11/02/2020 1417   BILITOT 0.4 11/02/2020 1417

## 2020-11-02 NOTE — Assessment & Plan Note (Signed)
She has recent profound weight loss We discussed the importance of frequent oral intake I will adjust the dose of her chemotherapy accordingly

## 2020-11-02 NOTE — Assessment & Plan Note (Signed)
Her pancytopenia has improved We will observe closely We will continue dose adjustment as before

## 2020-11-02 NOTE — Assessment & Plan Note (Signed)
She has resume anticoagulation therapy She needs to be on anticoagulation therapy for minimum 1 year

## 2020-11-02 NOTE — Progress Notes (Signed)
REFERRING PROVIDER: [name], MD [address] [place],   [address]  PRIMARY PROVIDER:  [name], MD  PRIMARY REASON FOR VISIT:  1. Ovarian CA, unspecified laterality ([place])      HISTORY OF PRESENT ILLNESS:   Ms. Shiroma, a 80 y.o. female, was seen for a [place] cancer genetics consultation at the request of Dr. Virgina Jock due to a personal history of ovarian cancer.  Ms. Herard presents to clinic today to discuss the possibility of a hereditary predisposition to cancer, genetic testing, and to further clarify her future cancer risks, as well as potential cancer risks for family members.   In August 2021, at the age of 62, Ms. Meenach was diagnosed with ovarian cancer. The treatment plan includes chemotherapy and surgery.      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  CT imaging also revealed abdominal mass.  Subsequent biopsy confirmed diagnosis of ovarian cancer   05/21/2020 Procedure   Successful ultrasound guided diagnostic and therapeutic left thoracentesis yielding 580 cc of pleural fluid.   05/21/2020 Imaging   1. Large saddle pulmonary embolus with CT evidence of right heart strain (RV/LV ratio of 1.6) consistent with at least submassive (intermediate risk) PE. The presence of right heart strain has been associated with an increased risk of morbidity and mortality. 2. Large left and small right pleural effusions. 3. Large amount of peritoneal nodularity, consistent with peritoneal carcinomatosis. 4. Large midline pelvic mass, measuring up to 12.9 x 8.7 x 9.7 cm. 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   05/21/2020 Pathology Results   Clinical  History: None provided  Specimen Submitted:  A. PLEURAL FLUID,LEFT, THORACENTESIS:    FINAL MICROSCOPIC DIAGNOSIS:  - Reactive mesothelial cells present   05/24/2020 Procedure   1. Successful placement of a right internal jugular approach power injectable Port-A-Cath. The catheter is ready for immediate use. 2. Successful ultrasound-guided paracentesis yielding 2.2 L of serous, slightly blood tinged ascitic fluid.   05/24/2020 Pathology Results   A. OMENTUM, NEEDLE CORE BIOPSY:  -  High-grade carcinoma  -  See comment   COMMENT:   By immunohistochemistry, the neoplastic cells are positive for cytokeratin 7 and PAX 8 but negative for cytokeratin 20, CDX2 and TTF-1. 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    05/30/2020 Cancer Staging   Staging form: Ovary, Fallopian Tube, and Primary Peritoneal Carcinoma, AJCC 8th Edition - Clinical stage from 05/30/2020: FIGO Stage IIIC (cT3, cN0, cM0) - Signed by [name], MD on 05/30/2020   06/10/2020 Tumor Marker   Patient's tumor was tested for the following markers: CA-125. Results of the tumor marker test revealed 27,432.   06/14/2020 -  Chemotherapy   The patient had carboplatin and taxol for chemotherapy treatment.     07/04/2020 Tumor Marker   Patient's tumor was tested for the following markers: CA-125 Results of the tumor marker test revealed [id number]   08/29/2020 Imaging   CT chest:   1. No evidence of thoracic metastasis. 2. Resolution of proximal pulmonary emboli.   CT abdomen pelvis:   1. Marked reduction in volume of bulky pelvic mass. Nodularity remains along the  uterine fundus. 2. Marked reduction in the nodular portions of the omental thickening. Cystic portions of the omental metastasis remain. 3. Decrease in volume of free fluid in the abdomen pelvis. 4. No new nodularity in the peritoneal space.     08/30/2020 Tumor Marker   Patient's tumor  was tested for the following markers: CA-125 Results of the tumor marker test revealed 359      RISK FACTORS:  Menarche was at age 10.  First live birth at age 11.  OCP use for approximately 5 years.  Ovaries intact: no.  Hysterectomy: yes.  Menopausal status: postmenopausal.  HRT use: 0 years. Colonoscopy: yes; normal. Mammogram within the last year: no. Number of breast biopsies: 1. Up to date with pelvic exams: yes. Any excessive radiation exposure in the past: no  Past Medical History:  Diagnosis Date  . Anxiety   . Arthritis    "all over my body" (05/27/2017)  . Chronic lower back pain   . Depression   . Dyspnea    allergy related SOB  . Frequent UTI   . GERD (gastroesophageal reflux disease)   . History of blood transfusion    "when I had my neck fusion"  . History of hiatal hernia   . Hypercholesteremia   . Hypertension   . Hypothyroid   . Pneumonia 2000s X 1; 05/27/2017  . PONV (postoperative nausea and vomiting)   . Seasonal allergies   . Type II diabetes mellitus ([place])     Past Surgical History:  Procedure Laterality Date  . ANKLE ARTHROSCOPY Right 03/29/2020   Procedure: RIGHT ANKLE ARTHROSCOPY AND DEBRIDEMENT;  Surgeon: Newt Minion, MD;  Location: [facility];  Service: Orthopedics;  Laterality: Right;  . BACK SURGERY    . BREAST BIOPSY Left   . CATARACT EXTRACTION W/ INTRAOCULAR LENS  IMPLANT, BILATERAL Bilateral   . COLONOSCOPY  10/25/2005   normal  . DEBULKING N/A 10/04/2020   Procedure: TUMOR DEBULKING;  Surgeon: Lafonda Mosses, MD;  Location: [facility];  Service: Gynecology;  Laterality: N/A;  . DILATION AND CURETTAGE OF UTERUS    . IR IMAGING GUIDED PORT INSERTION  05/24/2020  . IR PARACENTESIS  05/24/2020  . OMENTECTOMY N/A 10/04/2020   Procedure: OMENTECTOMY;  Surgeon: Lafonda Mosses, MD;  Location: [facility];  Service: Gynecology;  Laterality: N/A;  . PARTIAL HYSTERECTOMY  1968  . POSTERIOR CERVICAL FUSION/FORAMINOTOMY      "fused 4,5,6"  . SALPINGOOPHORECTOMY Bilateral 10/04/2020   Procedure: RACICAL OVARIAN CANCER DEBULKING INCLUDING BILATERAL  SALPINGO OOPHORECTOMY AND TRACHELECTOMY;  Surgeon: Lafonda Mosses, MD;  Location: [facility];  Service: Gynecology;  Laterality: Bilateral;  . TONSILLECTOMY      Social History   Socioeconomic History  . Marital status: Divorced    Spouse name: Not on file  . Number of children: Not on file  . Years of education: Not on file  . Highest education level: Not on file  Occupational History  . Not on file  Tobacco Use  . Smoking status: Former Smoker    Packs/day: 1.00    Years: 6.00    Pack years: 6.00    Types: Cigarettes    Quit date: 10/01/1964    Years since quitting: 56.1  . Smokeless tobacco: Never Used  Vaping Use  . Vaping Use: Never used  Substance and Sexual Activity  . Alcohol use: No  . Drug use: No  . Sexual activity: Not Currently  Other Topics Concern  .  Not on file  Social History Narrative  . Not on file   Social Determinants of Health   Financial Resource Strain: Not on file  Food Insecurity: Not on file  Transportation Needs: Not on file  Physical Activity: Not on file  Stress: Not on file  Social Connections: Not on file     FAMILY HISTORY:  We obtained a detailed, 4-generation family history.  Significant diagnoses are listed below: Family History  Problem Relation Age of Onset  . Alzheimer's disease Mother   . Heart attack Father   . Heart disease Father   . Dwarfism Sister   . Colon cancer Neg Hx   . Colon polyps Neg Hx   . Esophageal cancer Neg Hx   . Rectal cancer Neg Hx   . Stomach cancer Neg Hx     The patient has one daughter who is cancer free.  She had one sister who had achondroplasia but did not have cancer.  Both parents are deceased from non-cancer related issues.  The patient's mother died from complications of dementia.  She had three sisters and two brothers who were cancer free.  The maternal  grandparents are deceased.  The patient's father died of pneumonia.  He had four brothers and three sisters who were cancer free. The paternal grandparents are deceased.  Ms. Rohlman is unaware of previous family history of genetic testing for hereditary cancer risks. Patient's maternal ancestors are of Caucasian descent, and paternal ancestors are of Caucasian descent. There is no reported Ashkenazi Jewish ancestry. There is no known consanguinity.  GENETIC COUNSELING ASSESSMENT: Ms. Padmore is a 80 y.o. female with a personal history of ovarian cancer which is somewhat suggestive of a hereditary cancer syndrome and predisposition to cancer given the diagnosis of ovarian cancer. We, therefore, discussed and recommended the following at today's visit.   DISCUSSION: We discussed that up to 20% of ovarian cancer is hereditary, with most cases associated with BRCA mutations.  There are other genes that can be associated with hereditary ovarian cancer syndromes.  These include BRIP1, RAD51C, RAD51D and Lynch syndrome genes.  We discussed that testing is beneficial for several reasons including knowing how to follow individuals after completing their treatment, identifying whether potential treatment options such as PARP inhibitors would be beneficial, and understand if other family members could be at risk for cancer and allow them to undergo genetic testing.   We reviewed the characteristics, features and inheritance patterns of hereditary cancer syndromes. We also discussed genetic testing, including the appropriate family members to test, the process of testing, insurance coverage and turn-around-time for results. We discussed the implications of a negative, positive, carrier and/or variant of uncertain significant result. We recommended Ms. Sedam pursue genetic testing for the CancerNext-HRD gene panel. The CancerNext gene panel offered by [employer] includes sequencing and rearrangement analysis for  the following 34 genes:   APC, ATM, BARD1, BMPR1A, BRCA1, BRCA2, BRIP1, CDH1, CDK4, CDKN2A, CHEK2, DICER1, HOXB13, EPCAM, GREM1, MLH1, MRE11A, MSH2, MSH6, MUTYH, NBN, NF1, PALB2, PMS2, POLD1, POLE, PTEN, RAD50, RAD51C, RAD51D, SMAD4, SMARCA4, STK11, and TP53.    Testing will also be performed on the ovarian tumor to determine HRD status and PARP inhibitor eligibility.  Based on Ms. Higbie's personal history of cancer, she meets medical criteria for genetic testing. Despite that she meets criteria, she may still have an out of pocket cost. We discussed that if her out of pocket cost for testing is over $100, the laboratory will call and  confirm whether she wants to proceed with testing.  If the out of pocket cost of testing is less than $100 she will be billed by the genetic testing laboratory.   PLAN: After considering the risks, benefits, and limitations, Ms. Holcomb provided informed consent to pursue genetic testing and the blood sample was sent to [facility] for analysis of the CancerNext-HRD test. Results should be available within approximately 2-3 weeks' time, at which point they will be disclosed by telephone to Ms. Standley, as will any additional recommendations warranted by these results. Ms. Desilva will receive a summary of her genetic counseling visit and a copy of her results once available. This information will also be available in Epic.   Lastly, we encouraged Ms. Dunford to remain in contact with cancer genetics annually so that we can continuously update the family history and inform her of any changes in cancer genetics and testing that may be of benefit for this family.   Ms. Rockefeller questions were answered to her satisfaction today. Our contact information was provided should additional questions or concerns arise. Thank you for the referral and allowing [place] to share in the care of your patient.   Emmaleigh Longo P. Fl[place]ne [place], [place], [facility] Licensed, [email or web address] phone: [phone]  The patient was seen for a total of 30 minutes in face-to-face genetic counseling.  This patient was discussed with Drs. Magrinat, Lindi Adie and/or Burr Medico who agrees with the above.    _______________________________________________________________________ For Office Staff:  Number of people involved in session: 2 Was an Intern/ student involved with case: no

## 2020-11-02 NOTE — Assessment & Plan Note (Signed)
Renal function is stable I will adjust the dose of carboplatin accordingly 

## 2020-11-03 ENCOUNTER — Ambulatory Visit: Payer: Medicare Other | Admitting: Hematology and Oncology

## 2020-11-03 ENCOUNTER — Other Ambulatory Visit: Payer: Medicare Other

## 2020-11-03 LAB — CA 125: Cancer Antigen (CA) 125: 29.9 U/mL (ref 0.0–38.1)

## 2020-11-04 ENCOUNTER — Inpatient Hospital Stay: Payer: Medicare Other

## 2020-11-04 ENCOUNTER — Other Ambulatory Visit: Payer: Self-pay

## 2020-11-04 VITALS — BP 129/63 | HR 52 | Temp 98.0°F | Resp 18

## 2020-11-04 DIAGNOSIS — C562 Malignant neoplasm of left ovary: Secondary | ICD-10-CM

## 2020-11-04 DIAGNOSIS — Z5111 Encounter for antineoplastic chemotherapy: Secondary | ICD-10-CM | POA: Diagnosis not present

## 2020-11-04 MED ORDER — DIPHENHYDRAMINE HCL 50 MG/ML IJ SOLN
12.5000 mg | Freq: Once | INTRAMUSCULAR | Status: AC
  Administered 2020-11-04: 12.5 mg via INTRAVENOUS

## 2020-11-04 MED ORDER — FAMOTIDINE IN NACL 20-0.9 MG/50ML-% IV SOLN
20.0000 mg | Freq: Once | INTRAVENOUS | Status: AC
  Administered 2020-11-04: 20 mg via INTRAVENOUS

## 2020-11-04 MED ORDER — SODIUM CHLORIDE 0.9 % IV SOLN
150.0000 mg | Freq: Once | INTRAVENOUS | Status: AC
  Administered 2020-11-04: 150 mg via INTRAVENOUS
  Filled 2020-11-04: qty 150

## 2020-11-04 MED ORDER — FAMOTIDINE IN NACL 20-0.9 MG/50ML-% IV SOLN
INTRAVENOUS | Status: AC
  Filled 2020-11-04: qty 50

## 2020-11-04 MED ORDER — HEPARIN SOD (PORK) LOCK FLUSH 100 UNIT/ML IV SOLN
500.0000 [IU] | Freq: Once | INTRAVENOUS | Status: AC | PRN
  Administered 2020-11-04: 500 [IU]
  Filled 2020-11-04: qty 5

## 2020-11-04 MED ORDER — SODIUM CHLORIDE 0.9 % IV SOLN
10.0000 mg | Freq: Once | INTRAVENOUS | Status: AC
  Administered 2020-11-04: 10 mg via INTRAVENOUS
  Filled 2020-11-04: qty 10

## 2020-11-04 MED ORDER — PALONOSETRON HCL INJECTION 0.25 MG/5ML
0.2500 mg | Freq: Once | INTRAVENOUS | Status: AC
  Administered 2020-11-04: 0.25 mg via INTRAVENOUS

## 2020-11-04 MED ORDER — CARBOPLATIN CHEMO INJECTION 450 MG/45ML
228.8000 mg | Freq: Once | INTRAVENOUS | Status: AC
Start: 2020-11-04 — End: 2020-11-04
  Administered 2020-11-04: 230 mg via INTRAVENOUS
  Filled 2020-11-04: qty 23

## 2020-11-04 MED ORDER — SODIUM CHLORIDE 0.9 % IV SOLN
Freq: Once | INTRAVENOUS | Status: AC
  Filled 2020-11-04: qty 250

## 2020-11-04 MED ORDER — DIPHENHYDRAMINE HCL 50 MG/ML IJ SOLN
INTRAMUSCULAR | Status: AC
  Filled 2020-11-04: qty 1

## 2020-11-04 MED ORDER — SODIUM CHLORIDE 0.9 % IV SOLN
140.0000 mg/m2 | Freq: Once | INTRAVENOUS | Status: AC
  Administered 2020-11-04: 192 mg via INTRAVENOUS
  Filled 2020-11-04: qty 32

## 2020-11-04 MED ORDER — SODIUM CHLORIDE 0.9% FLUSH
10.0000 mL | INTRAVENOUS | Status: DC | PRN
  Administered 2020-11-04: 10 mL
  Filled 2020-11-04: qty 10

## 2020-11-04 MED ORDER — PALONOSETRON HCL INJECTION 0.25 MG/5ML
INTRAVENOUS | Status: AC
  Filled 2020-11-04: qty 5

## 2020-11-04 NOTE — Patient Instructions (Signed)
[facility] Discharge Instructions for Patients Receiving Chemotherapy  Today you received the following chemotherapy agents: paclitaxel (TAXOL)/carboplatin   To help prevent nausea and vomiting after your treatment, we encourage you to take your nausea medication as directed.   If you develop nausea and vomiting that is not controlled by your nausea medication, call the clinic.   BELOW ARE SYMPTOMS THAT SHOULD BE REPORTED IMMEDIATELY:  *FEVER GREATER THAN 100.5 F  *CHILLS WITH OR WITHOUT FEVER  NAUSEA AND VOMITING THAT IS NOT CONTROLLED WITH YOUR NAUSEA MEDICATION  *UNUSUAL SHORTNESS OF BREATH  *UNUSUAL BRUISING OR BLEEDING  TENDERNESS IN MOUTH AND THROAT WITH OR WITHOUT PRESENCE OF ULCERS  *URINARY PROBLEMS  *BOWEL PROBLEMS  UNUSUAL RASH Items with * indicate a potential emergency and should be followed up as soon as possible.  Feel free to call the clinic should you have any questions or concerns. The clinic phone number is [phone].  Please show the CHEMO ALERT CARD at check-in to the Emergency Department and triage nurse.   

## 2020-11-11 ENCOUNTER — Other Ambulatory Visit: Payer: Self-pay

## 2020-11-11 ENCOUNTER — Telehealth: Payer: Self-pay

## 2020-11-11 DIAGNOSIS — E860 Dehydration: Secondary | ICD-10-CM

## 2020-11-11 NOTE — Telephone Encounter (Signed)
Called back. She is complaining of nausea and vomiting that started yesterday. She is vomiting small amounts now. She has not taken her Zofran or compazine for nausea.  Instructed to take Zofran for nausea now/ and if needed every 8 hours. Instructed to take compazine in between if needed. Offered IV fluids today. She refused IV fluids and said her daughter is working and she does not want to bother her. Offered to call her daughter, she declined and does not want me to call her daughter.  She ask that I call her back in 1 hour. She is going to take the Zofran and see if it helps.

## 2020-11-11 NOTE — Telephone Encounter (Signed)
Called back. She is able to keep the Zofran down. She declined offer for IV fluids today. She does not want to bother her daughter. She is going to get her daughter to get some Gatorade/ Pedialyte and start drinking when she feels better. Offered IV fluids tomorrow. She will call daughter and I will call her back.

## 2020-11-11 NOTE — Telephone Encounter (Signed)
Called back and given appt time for 1030 am tomorrow for IV fluids. She said she feels better and is aware of appt time.

## 2020-11-11 NOTE — Telephone Encounter (Signed)
Received a message from GYN office that she called complaining of nausea/ vomiting.

## 2020-11-12 ENCOUNTER — Inpatient Hospital Stay: Payer: Medicare Other

## 2020-11-12 ENCOUNTER — Other Ambulatory Visit: Payer: Self-pay

## 2020-11-12 VITALS — BP 123/58 | HR 64 | Temp 98.0°F

## 2020-11-12 DIAGNOSIS — R112 Nausea with vomiting, unspecified: Secondary | ICD-10-CM

## 2020-11-12 DIAGNOSIS — Z5111 Encounter for antineoplastic chemotherapy: Secondary | ICD-10-CM | POA: Diagnosis not present

## 2020-11-12 DIAGNOSIS — C562 Malignant neoplasm of left ovary: Secondary | ICD-10-CM

## 2020-11-12 DIAGNOSIS — E860 Dehydration: Secondary | ICD-10-CM

## 2020-11-12 MED ORDER — ONDANSETRON HCL 4 MG/2ML IJ SOLN
INTRAMUSCULAR | Status: AC
  Filled 2020-11-12: qty 4

## 2020-11-12 MED ORDER — SODIUM CHLORIDE 0.9% FLUSH
10.0000 mL | Freq: Once | INTRAVENOUS | Status: AC
  Administered 2020-11-12: 10 mL
  Filled 2020-11-12: qty 10

## 2020-11-12 MED ORDER — ONDANSETRON HCL 4 MG/2ML IJ SOLN
8.0000 mg | Freq: Once | INTRAMUSCULAR | Status: AC
  Administered 2020-11-12: 8 mg via INTRAVENOUS

## 2020-11-12 MED ORDER — SODIUM CHLORIDE 0.9 % IV SOLN
INTRAVENOUS | Status: AC
  Filled 2020-11-12 (×2): qty 250

## 2020-11-12 MED ORDER — HEPARIN SOD (PORK) LOCK FLUSH 100 UNIT/ML IV SOLN
500.0000 [IU] | Freq: Once | INTRAVENOUS | Status: AC
  Administered 2020-11-12: 500 [IU]
  Filled 2020-11-12: qty 5

## 2020-11-12 NOTE — Progress Notes (Signed)
Dr. Julien Nordmann aware that patient and daughter stated patient has been vomiting since 2000 on 11/10/20 (MD Alvy Bimler was notified yesterday and made this IVF appt). Patient came in for 2 hours of IVF but began vomiting 90 minutes in, not violent or projectile but a constant spitting up of fluid/vomit. Advised her to stop drinking her fluid at bedside and to rest gut and let the Zofran try to work with the IVF. Daughter aware if this does not stop she will need to go the ER upon discharge from here. They verbalized understanding. VS were 138/64 and HR 67 when she began vomiting.   Now at 1400- pt remained for a little while past fluid to see if Zofran helped. She did stop vomiting and her daughter and her will decide if she will go to ER. VS stable and charted. Diet and home anti-emetics discussed with patient/daughter. Advised them certainly if they go home and she starts again, she needs to go to ER.

## 2020-11-12 NOTE — Patient Instructions (Signed)
Rehydration, Adult Rehydration is the replacement of body fluids, salts, and minerals (electrolytes) that are lost during dehydration. Dehydration is when there is not enough water or other fluids in the body. This happens when you lose more fluids than you take in. Common causes of dehydration include:  Not drinking enough fluids. This can occur when you are ill or doing activities that require a lot of energy, especially in hot weather.  Conditions that cause loss of water or other fluids, such as diarrhea, vomiting, sweating, or urinating a lot.  Other illnesses, such as fever or infection.  Certain medicines, such as those that remove excess fluid from the body (diuretics). Symptoms of mild or moderate dehydration may include thirst, dry lips and mouth, and dizziness. Symptoms of severe dehydration may include increased heart rate, confusion, fainting, and not urinating. For severe dehydration, you may need to get fluids through an IV at the hospital. For mild or moderate dehydration, you can usually rehydrate at home by drinking certain fluids as told by your health care provider. What are the risks? Generally, rehydration is safe. However, taking in too much fluid (overhydration) can be a problem. This is rare. Overhydration can cause an electrolyte imbalance, kidney failure, or a decrease in salt (sodium) levels in the body. Supplies needed You will need an oral rehydration solution (ORS) if your health care provider tells you to use one. This is a drink to treat dehydration. It can be found in pharmacies and retail stores. How to rehydrate Fluids Follow instructions from your health care provider for rehydration. The kind of fluid and the amount you should drink depend on your condition. In general, you should choose drinks that you prefer.  If told by your health care provider, drink an ORS. ? Make an ORS by following instructions on the package. ? Start by drinking small amounts,  about  cup (120 mL) every 5-10 minutes. ? Slowly increase how much you drink until you have taken the amount recommended by your health care provider.  Drink enough clear fluids to keep your urine pale yellow. If you were told to drink an ORS, finish it first, then start slowly drinking other clear fluids. Drink fluids such as: ? Water. This includes sparkling water and flavored water. Drinking only water can lead to having too little sodium in your body (hyponatremia). Follow the advice of your health care provider. ? Water from ice chips you suck on. ? Fruit juice with water you add to it (diluted). ? Sports drinks. ? Hot or cold herbal teas. ? Broth-based soups. ? Milk or milk products. Food Follow instructions from your health care provider about what to eat while you rehydrate. Your health care provider may recommend that you slowly begin eating regular foods in small amounts.  Eat foods that contain a healthy balance of electrolytes, such as bananas, oranges, potatoes, tomatoes, and spinach.  Avoid foods that are greasy or contain a lot of sugar. In some cases, you may get nutrition through a feeding tube that is passed through your nose and into your stomach (nasogastric tube, or NG tube). This may be done if you have uncontrolled vomiting or diarrhea.   Beverages to avoid Certain beverages may make dehydration worse. While you rehydrate, avoid drinking alcohol.   How to tell if you are recovering from dehydration You may be recovering from dehydration if:  You are urinating more often than before you started rehydrating.  Your urine is pale yellow.  Your energy level   improves.  You vomit less frequently.  You have diarrhea less frequently.  Your appetite improves or returns to normal.  You feel less dizzy or less light-headed.  Your skin tone and color start to look more normal. Follow these instructions at home:  Take over-the-counter and prescription medicines only  as told by your health care provider.  Do not take sodium tablets. Doing this can lead to having too much sodium in your body (hypernatremia). Contact a health care provider if:  You continue to have symptoms of mild or moderate dehydration, such as: ? Thirst. ? Dry lips. ? Slightly dry mouth. ? Dizziness. ? Dark urine or less urine than normal. ? Muscle cramps.  You continue to vomit or have diarrhea. Get help right away if you:  Have symptoms of dehydration that get worse.  Have a fever.  Have a severe headache.  Have been vomiting and the following happens: ? Your vomiting gets worse or does not go away. ? Your vomit includes blood or green matter (bile). ? You cannot eat or drink without vomiting.  Have problems with urination or bowel movements, such as: ? Diarrhea that gets worse or does not go away. ? Blood in your stool (feces). This may cause stool to look black and tarry. ? Not urinating, or urinating only a small amount of very dark urine, within 6-8 hours.  Have trouble breathing.  Have symptoms that get worse with treatment. These symptoms may represent a serious problem that is an emergency. Do not wait to see if the symptoms will go away. Get medical help right away. Call your local emergency services (911 in the [place] Do not drive yourself to the hospital. Summary  Rehydration is the replacement of body fluids and minerals (electrolytes) that are lost during dehydration.  Follow instructions from your health care provider for rehydration. The kind of fluid and amount you should drink depend on your condition.  Slowly increase how much you drink until you have taken the amount recommended by your health care provider.  Contact your health care provider if you continue to show signs of mild or moderate dehydration. This information is not intended to replace advice given to you by your health care provider. Make sure you discuss any questions you have with  your health care provider. Document Revised: 11/18/2019 Document Reviewed: 09/28/2019 Elsevier Patient Education  2021 Elsevier Inc.  

## 2020-11-14 ENCOUNTER — Telehealth: Payer: Self-pay

## 2020-11-14 NOTE — Telephone Encounter (Signed)
Attempted to call. Unable to leave a message due to voicemail not set up.

## 2020-11-14 NOTE — Telephone Encounter (Signed)
Called back. She is feeling much better after IV fluids on Saturday. She is able to eat and drink. Instructed to take compazine or Zofran if needed for nausea/vomiting and to call the office back She verbalized understanding. She said she thinks the chemo is too strong and made her sick. Instructed to bring her daughter to the next appt and talk with Dr. Alvy Bimler.

## 2020-11-14 NOTE — Telephone Encounter (Signed)
-----   Message from [name], MD sent at 11/14/2020  9:50 AM EST ----- Regarding: can you call and ask how she is feeling?

## 2020-12-08 ENCOUNTER — Other Ambulatory Visit: Payer: Self-pay | Admitting: Hematology and Oncology

## 2020-12-08 ENCOUNTER — Inpatient Hospital Stay: Payer: Medicare Other

## 2020-12-08 ENCOUNTER — Other Ambulatory Visit: Payer: Self-pay

## 2020-12-08 ENCOUNTER — Inpatient Hospital Stay: Payer: Medicare Other | Attending: Hematology and Oncology | Admitting: Hematology and Oncology

## 2020-12-08 ENCOUNTER — Telehealth: Payer: Self-pay | Admitting: Hematology and Oncology

## 2020-12-08 ENCOUNTER — Encounter: Payer: Self-pay | Admitting: Hematology and Oncology

## 2020-12-08 VITALS — BP 125/50 | HR 51 | Temp 97.5°F | Resp 18 | Ht 60.0 in | Wt 97.6 lb

## 2020-12-08 DIAGNOSIS — Z5111 Encounter for antineoplastic chemotherapy: Secondary | ICD-10-CM | POA: Diagnosis not present

## 2020-12-08 DIAGNOSIS — R634 Abnormal weight loss: Secondary | ICD-10-CM | POA: Diagnosis not present

## 2020-12-08 DIAGNOSIS — C569 Malignant neoplasm of unspecified ovary: Secondary | ICD-10-CM

## 2020-12-08 DIAGNOSIS — T451X5A Adverse effect of antineoplastic and immunosuppressive drugs, initial encounter: Secondary | ICD-10-CM

## 2020-12-08 DIAGNOSIS — C562 Malignant neoplasm of left ovary: Secondary | ICD-10-CM

## 2020-12-08 DIAGNOSIS — N183 Chronic kidney disease, stage 3 unspecified: Secondary | ICD-10-CM | POA: Diagnosis not present

## 2020-12-08 DIAGNOSIS — D6481 Anemia due to antineoplastic chemotherapy: Secondary | ICD-10-CM | POA: Diagnosis not present

## 2020-12-08 LAB — CBC WITH DIFFERENTIAL (CANCER CENTER ONLY)
Abs Immature Granulocytes: 0.01 10*3/uL (ref 0.00–0.07)
Basophils Absolute: 0 10*3/uL (ref 0.0–0.1)
Basophils Relative: 1 %
Eosinophils Absolute: 0.4 10*3/uL (ref 0.0–0.5)
Eosinophils Relative: 7 %
HCT: 31.7 % — ABNORMAL LOW (ref 36.0–46.0)
Hemoglobin: 10.2 g/dL — ABNORMAL LOW (ref 12.0–15.0)
Immature Granulocytes: 0 %
Lymphocytes Relative: 30 %
Lymphs Abs: 1.6 10*3/uL (ref 0.7–4.0)
MCH: 28.2 pg (ref 26.0–34.0)
MCHC: 32.2 g/dL (ref 30.0–36.0)
MCV: 87.6 fL (ref 80.0–100.0)
Monocytes Absolute: 0.5 10*3/uL (ref 0.1–1.0)
Monocytes Relative: 8 %
Neutro Abs: 2.9 10*3/uL (ref 1.7–7.7)
Neutrophils Relative %: 54 %
Platelet Count: 203 10*3/uL (ref 150–400)
RBC: 3.62 MIL/uL — ABNORMAL LOW (ref 3.87–5.11)
RDW: 15.2 % (ref 11.5–15.5)
WBC Count: 5.3 10*3/uL (ref 4.0–10.5)
nRBC: 0 % (ref 0.0–0.2)

## 2020-12-08 LAB — CMP (CANCER CENTER ONLY)
ALT: 22 U/L (ref 0–44)
AST: 24 U/L (ref 15–41)
Albumin: 3.7 g/dL (ref 3.5–5.0)
Alkaline Phosphatase: 74 U/L (ref 38–126)
Anion gap: 7 (ref 5–15)
BUN: 16 mg/dL (ref 8–23)
CO2: 29 mmol/L (ref 22–32)
Calcium: 9 mg/dL (ref 8.9–10.3)
Chloride: 102 mmol/L (ref 98–111)
Creatinine: 0.94 mg/dL (ref 0.44–1.00)
GFR, Estimated: >60 mL/min (ref >60)
Glucose, Bld: 119 mg/dL — ABNORMAL HIGH (ref 70–99)
Potassium: 3.3 mmol/L — ABNORMAL LOW (ref 3.5–5.1)
Sodium: 138 mmol/L (ref 135–145)
Total Bilirubin: 0.4 mg/dL (ref 0.3–1.2)
Total Protein: 7.7 g/dL (ref 6.5–8.1)

## 2020-12-08 MED ORDER — SODIUM CHLORIDE 0.9 % IV SOLN
Freq: Once | INTRAVENOUS | Status: AC
  Filled 2020-12-08: qty 250

## 2020-12-08 MED ORDER — SODIUM CHLORIDE 0.9% FLUSH
10.0000 mL | Freq: Once | INTRAVENOUS | Status: AC
  Administered 2020-12-08: 10 mL
  Filled 2020-12-08: qty 10

## 2020-12-08 MED ORDER — PALONOSETRON HCL INJECTION 0.25 MG/5ML
INTRAVENOUS | Status: AC
  Filled 2020-12-08: qty 5

## 2020-12-08 MED ORDER — SODIUM CHLORIDE 0.9 % IV SOLN
10.0000 mg | Freq: Once | INTRAVENOUS | Status: AC
  Administered 2020-12-08: 10 mg via INTRAVENOUS
  Filled 2020-12-08: qty 10

## 2020-12-08 MED ORDER — SODIUM CHLORIDE 0.9 % IV SOLN
150.0000 mg | Freq: Once | INTRAVENOUS | Status: AC
  Administered 2020-12-08: 150 mg via INTRAVENOUS
  Filled 2020-12-08: qty 150

## 2020-12-08 MED ORDER — HEPARIN SOD (PORK) LOCK FLUSH 100 UNIT/ML IV SOLN
500.0000 [IU] | Freq: Once | INTRAVENOUS | Status: DC
  Filled 2020-12-08: qty 5

## 2020-12-08 MED ORDER — PALONOSETRON HCL INJECTION 0.25 MG/5ML
0.2500 mg | Freq: Once | INTRAVENOUS | Status: AC
  Administered 2020-12-08: 0.25 mg via INTRAVENOUS

## 2020-12-08 MED ORDER — SODIUM CHLORIDE 0.9 % IV SOLN
225.6000 mg | Freq: Once | INTRAVENOUS | Status: AC
Start: 2020-12-08 — End: 2020-12-08
  Administered 2020-12-08: 230 mg via INTRAVENOUS
  Filled 2020-12-08: qty 23

## 2020-12-08 NOTE — Assessment & Plan Note (Signed)
Renal function is stable I will adjust the dose of carboplatin accordingly

## 2020-12-08 NOTE — Patient Instructions (Signed)
[facility] Discharge Instructions for Patients Receiving Chemotherapy  Today you received the following chemotherapy agents: carboplatin.  To help prevent nausea and vomiting after your treatment, we encourage you to take your nausea medication as directed.   If you develop nausea and vomiting that is not controlled by your nausea medication, call the clinic.   BELOW ARE SYMPTOMS THAT SHOULD BE REPORTED IMMEDIATELY:  *FEVER GREATER THAN 100.5 F  *CHILLS WITH OR WITHOUT FEVER  NAUSEA AND VOMITING THAT IS NOT CONTROLLED WITH YOUR NAUSEA MEDICATION  *UNUSUAL SHORTNESS OF BREATH  *UNUSUAL BRUISING OR BLEEDING  TENDERNESS IN MOUTH AND THROAT WITH OR WITHOUT PRESENCE OF ULCERS  *URINARY PROBLEMS  *BOWEL PROBLEMS  UNUSUAL RASH Items with * indicate a potential emergency and should be followed up as soon as possible.  Feel free to call the clinic should you have any questions or concerns. The clinic phone number is [phone].  Please show the CHEMO ALERT CARD at check-in to the Emergency Department and triage nurse.   

## 2020-12-08 NOTE — Assessment & Plan Note (Signed)
She continues to have progressive weight loss As above, I plan to discontinue paclitaxel and do single agent carboplatin and will adjust according to her current weight

## 2020-12-08 NOTE — Patient Instructions (Signed)

## 2020-12-08 NOTE — Progress Notes (Signed)
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 The patient denies recent history of bleeding such as epistaxis, hematuria or hematochezia. She is asymptomatic from the anemia. I will observe for now.  She does not require transfusion now. I will continue the chemotherapy at current dose without dosage adjustment.  If the anemia gets progressive worse in the future, I might have to delay her treatment or adjust the chemotherapy dose.   Weight loss She continues to have progressive weight loss As above, I plan to discontinue paclitaxel and do single agent carboplatin and will adjust according to her current weight   Orders Placed This Encounter  Procedures   CT ABDOMEN PELVIS W CONTRAST    Standing Status:   Future    Standing Expiration Date:   12/08/2021    Order Specific Question:   If indicated for the ordered procedure, I authorize the administration of contrast media per Radiology protocol    Answer:   Yes    Order Specific Question:   Preferred imaging location?    Answer:   [facility]    Order Specific Question:   Radiology Contrast Protocol - do NOT remove file path    Answer:   [email or web address]     All questions were answered. The patient knows to call the clinic with any problems, questions or concerns. The total time spent in the appointment was 30 minutes encounter with patients including review of chart and various tests results, discussions about plan of care and coordination of care plan   [name], MD 12/08/2020 1:33 PM  INTERVAL HISTORY: Please see below for problem oriented charting. She returns for cycle 6 of treatment She has progressive weight loss She suffered from severe nausea and vomiting with last cycle of treatment She denies pain The patient denies any recent signs or symptoms of bleeding such as spontaneous epistaxis, hematuria or hematochezia. 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  CT imaging also revealed abdominal mass.  Subsequent biopsy confirmed diagnosis of ovarian cancer   05/21/2020 Procedure   Successful ultrasound guided diagnostic and therapeutic left thoracentesis yielding 580 cc of pleural fluid.   05/21/2020 Imaging   1. Large saddle pulmonary embolus with CT evidence of right heart strain (RV/LV ratio of 1.6) consistent with at least submassive (intermediate risk) PE. The presence of right heart strain has been associated with an increased risk of morbidity and mortality. 2. Large left and small right pleural effusions. 3. Large amount of peritoneal nodularity, consistent with peritoneal carcinomatosis.  4. Large midline pelvic mass, measuring up to 12.9 x 8.7 x 9.7 cm. 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   05/21/2020 Pathology Results   Clinical History: None provided   Specimen Submitted:  A. PLEURAL FLUID,LEFT, THORACENTESIS:    FINAL MICROSCOPIC DIAGNOSIS:  - Reactive mesothelial cells present   05/24/2020 Procedure   1. Successful placement of a right internal jugular approach power injectable Port-A-Cath. The catheter is ready for immediate use. 2. Successful ultrasound-guided paracentesis yielding 2.2 L of serous, slightly blood tinged ascitic fluid.   05/24/2020 Pathology Results   A. OMENTUM, NEEDLE CORE BIOPSY:  -  High-grade carcinoma  -  See comment   COMMENT:   By immunohistochemistry, the neoplastic cells are positive for cytokeratin 7 and PAX 8 but negative for cytokeratin 20, CDX2 and TTF-1. 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    05/30/2020 Cancer Staging   Staging form: Ovary, Fallopian Tube, and Primary Peritoneal Carcinoma, AJCC 8th Edition - Clinical stage from 05/30/2020: FIGO Stage IIIC (cT3, cN0, cM0) - Signed by [name], MD on 05/30/2020   06/10/2020 Tumor Marker   Patient's tumor was tested for the following markers: CA-125. Results of the tumor marker test revealed 27,432.   06/14/2020 -  Chemotherapy   The patient had carboplatin and taxol for chemotherapy treatment.     07/04/2020 Tumor Marker   Patient's tumor was tested for the following markers: CA-125 Results of the tumor marker test revealed [id number]   08/29/2020 Imaging   CT chest:   1. No evidence of thoracic metastasis. 2. Resolution of proximal pulmonary emboli.   CT abdomen pelvis:   1. Marked reduction in volume of bulky pelvic mass. Nodularity remains along the uterine fundus. 2. Marked reduction in the nodular portions of the omental thickening. Cystic portions of the omental metastasis remain. 3. Decrease in volume of free fluid in the abdomen pelvis. 4. No new nodularity in the peritoneal space.     08/30/2020 Tumor Marker   Patient's tumor was tested for the  following markers: CA-125 Results of the tumor marker test revealed 359   10/04/2020 Surgery   Operation: Exlap, radical tumor debulking including omentectomy, BSO, trachelectomy   Surgeon: [name]    Operative Findings: On EUA, cervix not definitively appreciated. Nodularity and somewhat fixed mass noted in the cul de sac. On intra-abdominal entry, normal and smooth diaphragm, stomach, liver edge. Omentum densely adherent to the transverse colon with two 3-5cm cystic masses vs treated tumor within the infracolic portion of the mass.  One of these masses was densely adherent to the anterior abdominal wall in the left upper abdomen.  Otherwise, omentum without evidence of disease. Small bowel normal in appearance. No adenopathy. No ascites. Bilateral tubes normal appearing. Ovaries in conglomerate mass with cervix. Mass densely adherent to the rectum posteriorly. Bladder adherent anteriorly to the cervix and superior to the cervix/ovarian mass conglomeration. After resection of the cervix and bilateral adnexa, the remaining nodules were able to be removed from the rectum.  Bubble test was negative for disruption of the rectum.  No palpable or visible tumor at the end of surgery (R0 resection)     10/04/2020 Pathology Results   SURGICAL PATHOLOGY  CASE: [id number]  PATIENT: Amber Gray  Surgical Pathology Report   FINAL MICROSCOPIC DIAGNOSIS:   A. 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   B. 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  - Tumor involves left ovary, right fallopian tube, uterine serosa, uterine wall (myometrium) and rectal implant.  - See oncology table.   ONCOLOGY TABLE:   OVARY or FALLOPIAN TUBE or PRIMARY  PERITONEUM: Resection   Procedure: Total hysterectomy and bilateral salpingo-oophorectomy,  Excision of rectal implant  Specimen Integrity: Intact  Tumor Site: Left ovary  Tumor Size: Multifocal, largest focus measures 2.1 cm  Histologic Type: Serous carcinoma  Histologic Grade: High grade  Ovarian Surface Involvement: Present, left ovary  Fallopian Tube Surface Involvement: Present, right fallopian tube  Implants: Present, rectum  Other Tissue/ Organ Involvement: Uterine serosa, uterine wall  (myometrium)  Largest Extrapelvic Peritoneal Focus: 5 cm  Peritoneal/Ascitic Fluid Involvement: Not applicable  Chemotherapy Response Score (CRS): CRS2 (moderate response)  Regional Lymph Nodes: Not applicable (no regional lymph nodes submitted  or found)  Distant Metastasis:       Distant Site(s) Involved: Not applicable  Pathologic Stage Classification (pTNM, AJCC 8th Edition): ypT3c, pN not assigned  Additional Findings: Mild acute and chronic cervicitis.  Attenuated inactive endometrium with scattered cystically dilated glands.  Leiomyomata.    11/02/2020 Tumor Marker   Patient's tumor was tested for the following markers: CA-125 Results of the tumor marker test revealed 29.9.     REVIEW OF SYSTEMS:   Constitutional: Denies fevers, chills  Eyes: Denies blurriness of vision Ears, nose, mouth, throat, and face: Denies mucositis or sore throat Respiratory: Denies cough, dyspnea or wheezes Cardiovascular: Denies palpitation, chest discomfort or lower extremity swelling Gastrointestinal:  Denies nausea, heartburn or change in bowel habits Skin: Denies abnormal skin rashes Lymphatics: Denies new lymphadenopathy or easy bruising Neurological:Denies numbness, tingling or new weaknesses Behavioral/Psych: Mood is stable, no new changes  All other systems were reviewed with the patient and are negative.  I have reviewed the past medical history, past surgical history, social history and  family history with the patient and they are unchanged from previous note.  ALLERGIES:  is allergic to metformin and related.  MEDICATIONS:  Current Outpatient Medications  Medication Sig Dispense Refill   acetaminophen (TYLENOL) 325 MG tablet Take 650 mg by mouth every 6 (six) hours as needed for mild pain.     empagliflozin (JARDIANCE) 10 MG TABS tablet Take 10 mg by mouth every other day.     furosemide (LASIX) 40 MG tablet Take 40 mg by mouth daily.   11   levothyroxine (SYNTHROID) 88 MCG tablet Take 88 mcg by mouth daily before breakfast.      lidocaine-prilocaine (EMLA) cream Apply 1 application topically daily as needed. (Patient not taking: No sig reported) 30 g 11   LINZESS 72 MCG capsule Take 72 mcg by mouth daily as needed (Constipation).     melatonin 5 MG TABS Take 5 mg by mouth at bedtime as needed (Sleep).     omeprazole (PRILOSEC) 20 MG capsule Take 20 mg by mouth daily. (Patient not taking: No sig reported)     ondansetron (ZOFRAN) 8 MG tablet Take 1 tablet (8 mg total) by mouth every 8 (eight) hours as needed for nausea. (Patient not taking: No sig reported) 30 tablet 3   prochlorperazine (COMPAZINE) 10 MG tablet Take 1 tablet (10 mg total) by mouth every 6 (six) hours as needed  for nausea or vomiting. (Patient not taking: No sig reported) 30 tablet 1   senna-docusate (SENOKOT-S) 8.6-50 MG tablet Take 2 tablets by mouth at bedtime. For AFTER surgery, do not take if having diarrhea 30 tablet 0   sertraline (ZOLOFT) 50 MG tablet Take 50 mg by mouth daily.  0   simvastatin (ZOCOR) 40 MG tablet Take 40 mg by mouth daily.  3   TRADJENTA 5 MG TABS tablet Take 5 mg by mouth daily after supper.  3   No current facility-administered medications for this visit.   Facility-Administered Medications Ordered in Other Visits  Medication Dose Route Frequency Provider Last Rate Last Admin   CARBOplatin (PARAPLATIN) 230 mg in sodium chloride 0.9 % 250 mL chemo infusion  230  mg Intravenous Once Alvy Bimler, Maryln Eastham, MD       dexamethasone (DECADRON) 10 mg in sodium chloride 0.9 % 50 mL IVPB  10 mg Intravenous Once Alvy Bimler, Shayleigh Bouldin, MD       fosaprepitant (EMEND) 150 mg in sodium chloride 0.9 % 145 mL IVPB  150 mg Intravenous Once Alvy Bimler, Omega Slager, MD        PHYSICAL EXAMINATION: ECOG PERFORMANCE STATUS: 2 - Symptomatic, <50% confined to bed  Vitals:   12/08/20 1243  BP: (!) 125/50  Pulse: (!) 51  Resp: 18  Temp: (!) 97.5 F (36.4 C)  SpO2: 100%   Filed Weights   12/08/20 1243  Weight: 97 lb 9.6 oz (44.3 kg)    GENERAL:alert, no distress and comfortable.  She looks thin and emaciated  NEURO: alert & oriented x 3 with fluent speech, no focal motor/sensory deficits  LABORATORY DATA:  I have reviewed the data as listed    Component Value Date/Time   NA 138 12/08/2020 1205   K 3.3 (L) 12/08/2020 1205   CL 102 12/08/2020 1205   CO2 29 12/08/2020 1205   GLUCOSE 119 (H) 12/08/2020 1205   BUN 16 12/08/2020 1205   CREATININE 0.94 12/08/2020 1205   CALCIUM 9.0 12/08/2020 1205   PROT 7.7 12/08/2020 1205   ALBUMIN 3.7 12/08/2020 1205   AST 24 12/08/2020 1205   ALT 22 12/08/2020 1205   ALKPHOS 74 12/08/2020 1205   BILITOT 0.4 12/08/2020 1205   GFRNONAA >60 12/08/2020 1205   GFRAA 59 (L) 07/04/2020 1217    No results found for: SPEP, UPEP  Lab Results  Component Value Date   WBC 5.3 12/08/2020   NEUTROABS 2.9 12/08/2020   HGB 10.2 (L) 12/08/2020   HCT 31.7 (L) 12/08/2020   MCV 87.6 12/08/2020   PLT 203 12/08/2020      Chemistry      Component Value Date/Time   NA 138 12/08/2020 1205   K 3.3 (L) 12/08/2020 1205   CL 102 12/08/2020 1205   CO2 29 12/08/2020 1205   BUN 16 12/08/2020 1205   CREATININE 0.94 12/08/2020 1205      Component Value Date/Time   CALCIUM 9.0 12/08/2020 1205   ALKPHOS 74 12/08/2020 1205   AST 24 12/08/2020 1205   ALT 22 12/08/2020 1205   BILITOT 0.4 12/08/2020 1205

## 2020-12-08 NOTE — Assessment & Plan Note (Signed)
The patient has complete resection with no residual disease She continues to have progressive weight loss and poor tolerance to last cycle of treatment After much discussion about the risk and benefits of discontinuation of treatment, were in agreement to stop paclitaxel and today we will proceed with single agent carboplatin only Next month, I plan to order CT imaging for new baseline Genetic testing result is pending

## 2020-12-08 NOTE — Telephone Encounter (Signed)
Scheduled appts per 3/10 sch msg. Gave pt a print out of AVS.

## 2020-12-08 NOTE — Assessment & Plan Note (Signed)

## 2020-12-09 ENCOUNTER — Inpatient Hospital Stay: Payer: Medicare Other

## 2020-12-09 LAB — CA 125: Cancer Antigen (CA) 125: 22.1 U/mL (ref 0.0–38.1)

## 2020-12-13 ENCOUNTER — Telehealth: Payer: Self-pay | Admitting: Genetic Counselor

## 2020-12-13 ENCOUNTER — Ambulatory Visit: Payer: Self-pay | Admitting: Genetic Counselor

## 2020-12-13 ENCOUNTER — Encounter: Payer: Self-pay | Admitting: Genetic Counselor

## 2020-12-13 DIAGNOSIS — Z1379 Encounter for other screening for genetic and chromosomal anomalies: Secondary | ICD-10-CM | POA: Insufficient documentation

## 2020-12-13 NOTE — Telephone Encounter (Signed)
Revealed negative genetic testing.  Discussed that we do not know why she has ovarian cancer.  It could be due to a different gene that we are not testing, or maybe our current technology may not be able to pick something up.  It will be important for her to keep in contact with genetics to keep up with whether additional testing may be needed.

## 2020-12-13 NOTE — Progress Notes (Signed)
HPI:  Amber Gray was previously seen in the [place] clinic due to a personal history of ovarian cancer and concerns regarding a hereditary predisposition to cancer. Please refer to our prior cancer genetics clinic note for more information regarding our discussion, assessment and recommendations, at the time. Amber Gray recent genetic test results were disclosed to her, as were recommendations warranted by these results. These results and recommendations are discussed in more detail below.  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  CT imaging also revealed abdominal mass.  Subsequent biopsy confirmed diagnosis of ovarian cancer   05/21/2020 Procedure   Successful ultrasound guided diagnostic and therapeutic left thoracentesis yielding 580 cc of pleural fluid.   05/21/2020 Imaging   1. Large saddle pulmonary embolus with CT evidence of right heart strain (RV/LV ratio of 1.6) consistent with at least submassive (intermediate risk) PE. The presence of right heart strain has been associated with an increased risk of morbidity and mortality. 2. Large left and small right pleural effusions. 3. Large amount of peritoneal nodularity, consistent with peritoneal carcinomatosis. 4. Large midline pelvic mass, measuring up to 12.9 x 8.7 x 9.7 cm. 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   05/21/2020 Pathology Results   Clinical History: None provided  Specimen Submitted:  A. PLEURAL FLUID,LEFT, THORACENTESIS:    FINAL MICROSCOPIC DIAGNOSIS:  - Reactive mesothelial cells present   05/24/2020 Procedure   1. Successful placement of a right internal jugular approach power injectable  Port-A-Cath. The catheter is ready for immediate use. 2. Successful ultrasound-guided paracentesis yielding 2.2 L of serous, slightly blood tinged ascitic fluid.   05/24/2020 Pathology Results   A. OMENTUM, NEEDLE CORE BIOPSY:  -  High-grade carcinoma  -  See comment   COMMENT:   By immunohistochemistry, the neoplastic cells are positive for cytokeratin 7 and PAX 8 but negative for cytokeratin 20, CDX2 and TTF-1. 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    05/30/2020 Cancer Staging   Staging form: Ovary, Fallopian Tube, and Primary Peritoneal Carcinoma, AJCC 8th Edition - Clinical stage from 05/30/2020: FIGO Stage IIIC (cT3, cN0, cM0) - Signed by [name], MD on 05/30/2020   06/10/2020 Tumor Marker   Patient's tumor was tested for the following markers: CA-125. Results of the tumor marker test revealed 27,432.   06/14/2020 -  Chemotherapy   The patient had carboplatin and taxol for chemotherapy treatment.     07/04/2020 Tumor Marker   Patient's tumor was tested for the following markers: CA-125 Results of the tumor marker test revealed [id number]   08/29/2020 Imaging   CT chest:   1. No evidence of thoracic metastasis. 2. Resolution of proximal pulmonary emboli.   CT abdomen pelvis:   1. Marked reduction in volume of bulky pelvic mass. Nodularity remains along the uterine fundus. 2. Marked reduction in the nodular portions of the omental thickening. Cystic portions of the omental metastasis remain. 3. Decrease in volume of free fluid in the abdomen pelvis. 4. No new nodularity in the peritoneal space.     08/30/2020 Tumor Marker   Patient's tumor was tested for the following markers: CA-125 Results  of the tumor marker test revealed 359   10/04/2020 Surgery   Operation: Exlap, radical tumor debulking including omentectomy, BSO, trachelectomy   Surgeon: [name]    Operative Findings: On  EUA, cervix not definitively appreciated. Nodularity and somewhat fixed mass noted in the cul de sac. On intra-abdominal entry, normal and smooth diaphragm, stomach, liver edge. Omentum densely adherent to the transverse colon with two 3-5cm cystic masses vs treated tumor within the infracolic portion of the mass.  One of these masses was densely adherent to the anterior abdominal wall in the left upper abdomen.  Otherwise, omentum without evidence of disease. Small bowel normal in appearance. No adenopathy. No ascites. Bilateral tubes normal appearing. Ovaries in conglomerate mass with cervix. Mass densely adherent to the rectum posteriorly. Bladder adherent anteriorly to the cervix and superior to the cervix/ovarian mass conglomeration. After resection of the cervix and bilateral adnexa, the remaining nodules were able to be removed from the rectum.  Bubble test was negative for disruption of the rectum.  No palpable or visible tumor at the end of surgery (R0 resection)     10/04/2020 Pathology Results   SURGICAL PATHOLOGY  CASE: [id number]  PATIENT: Amber Gray  Surgical Pathology Report   FINAL MICROSCOPIC DIAGNOSIS:   A. 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   B. 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  - Tumor involves left ovary, right fallopian tube, uterine serosa, uterine wall (myometrium) and rectal implant.  - See oncology table.   ONCOLOGY TABLE:   OVARY or FALLOPIAN TUBE or PRIMARY PERITONEUM: Resection   Procedure: Total hysterectomy and bilateral salpingo-oophorectomy,  Excision of rectal implant  Specimen Integrity: Intact  Tumor Site: Left ovary  Tumor Size: Multifocal, largest focus measures 2.1 cm  Histologic  Type: Serous carcinoma  Histologic Grade: High grade  Ovarian Surface Involvement: Present, left ovary  Fallopian Tube Surface Involvement: Present, right fallopian tube  Implants: Present, rectum  Other Tissue/ Organ Involvement: Uterine serosa, uterine wall  (myometrium)  Largest Extrapelvic Peritoneal Focus: 5 cm  Peritoneal/Ascitic Fluid Involvement: Not applicable  Chemotherapy Response Score (CRS): CRS2 (moderate response)  Regional Lymph Nodes: Not applicable (no regional lymph nodes submitted  or found)  Distant Metastasis:       Distant Site(s) Involved: Not applicable  Pathologic Stage Classification (pTNM, AJCC 8th Edition): ypT3c, pN not assigned  Additional Findings: Mild acute and chronic cervicitis.  Attenuated inactive endometrium with scattered cystically dilated glands.  Leiomyomata.    11/02/2020 Tumor Marker   Patient's tumor was tested for the following markers: CA-125 Results of the tumor marker test revealed 29.9.   12/09/2020 Tumor Marker   Patient's tumor was tested for the following markers: CA-125 Results of the tumor marker test revealed 22.1   12/12/2020 Genetic Testing   Negative genetic testing on the CancerNext panel.  The CancerNext gene panel offered by [employer] includes sequencing and rearrangement analysis for the following 34 genes:   APC, ATM, BARD1, BMPR1A, BRCA1, BRCA2, BRIP1, CDH1, CDK4, CDKN2A, CHEK2, DICER1, HOXB13, EPCAM, GREM1, MLH1, MRE11A, MSH2, MSH6, MUTYH, NBN, NF1, PALB2, PMS2, POLD1, POLE, PTEN, RAD50, RAD51C, RAD51D, SMAD4, SMARCA4, STK11, and TP53.    Negative genetic testing on the [facility] tumor testing, suggesting that the patient's tumor will not be more sensitive to PARP inhibitors.  The  report date is December 12, 2020.     FAMILY HISTORY:  We obtained a detailed, 4-generation family history.  Significant diagnoses are listed below: Family History  Problem Relation Age of Onset  . Alzheimer's disease Mother   . Heart attack  Father   . Heart disease Father   . Dwarfism Sister   . Colon cancer Neg Hx   . Colon polyps Neg Hx   . Esophageal cancer Neg Hx   . Rectal cancer Neg Hx   . Stomach cancer Neg Hx     The patient has one daughter who is cancer free.  She had one sister who had achondroplasia but did not have cancer.  Both parents are deceased from non-cancer related issues.  The patient's mother died from complications of dementia.  She had three sisters and two brothers who were cancer free.  The maternal grandparents are deceased.  The patient's father died of pneumonia.  He had four brothers and three sisters who were cancer free. The paternal grandparents are deceased.  Ms. Lucado is unaware of previous family history of genetic testing for hereditary cancer risks. Patient's maternal ancestors are of Caucasian descent, and paternal ancestors are of Caucasian descent. There is no reported Ashkenazi Jewish ancestry. There is no known consanguinity.  GENETIC TEST RESULTS: Genetic testing reported out on December 12, 2020 through the [facility] cancer panel found no pathogenic mutations. The CancerNext gene panel offered by [employer] includes sequencing and rearrangement analysis for the following 34 genes:   APC, ATM, BARD1, BMPR1A, BRCA1, BRCA2, BRIP1, CDH1, CDK4, CDKN2A, CHEK2, DICER1, HOXB13, EPCAM, GREM1, MLH1, MRE11A, MSH2, MSH6, MUTYH, NBN, NF1, PALB2, PMS2, POLD1, POLE, PTEN, RAD50, RAD51C, RAD51D, SMAD4, SMARCA4, STK11, and TP53. The test report has been scanned into EPIC and is located under the Molecular Pathology section of the Results Review tab.  A portion of the result report is included below for reference.     We discussed with Ms. Magadan that because current genetic testing is not perfect, it is possible there may be a gene mutation in one of these genes that current testing cannot detect, but that chance is small.  We also discussed, that there could be another gene that has not yet  been discovered, or that we have not yet tested, that is responsible for the cancer diagnoses in the family. It is also possible there is a hereditary cause for the cancer in the family that Ms. Morford did not inherit and therefore was not identified in her testing.  Therefore, it is important to remain in touch with cancer genetics in the future so that we can continue to offer Ms. Tarkowski the most up to date genetic testing.   ADDITIONAL GENETIC TESTING: We discussed with Ms. Sayler that her genetic testing was fairly extensive.  If there are genes identified to increase cancer risk that can be analyzed in the future, we would be happy to discuss and coordinate this testing at that time.    CANCER SCREENING RECOMMENDATIONS: Ms. Sleep test result is considered negative (normal).  This means that we have not identified a hereditary cause for her personal history of ovarian cancer at this time. Most cancers happen by chance and this negative test suggests that her cancer may fall into this category.    While reassuring, this does not definitively rule out a hereditary predisposition to cancer. It is still possible that there could be genetic mutations that are undetectable by current technology. There could be genetic  mutations in genes that have not been tested or identified to increase cancer risk.  Therefore, it is recommended she continue to follow the cancer management and screening guidelines provided by her oncology and primary healthcare provider.   An individual's cancer risk and medical management are not determined by genetic test results alone. Overall cancer risk assessment incorporates additional factors, including personal medical history, family history, and any available genetic information that may result in a personalized plan for cancer prevention and surveillance  RECOMMENDATIONS FOR FAMILY MEMBERS:  Individuals in this family might be at some increased risk of developing cancer,  over the general population risk, simply due to the family history of cancer.  We recommended women in this family have a yearly mammogram beginning at age 27, or 32 years younger than the earliest onset of cancer, an annual clinical breast exam, and perform monthly breast self-exams. Women in this family should also have a gynecological exam as recommended by their primary provider. All family members should be referred for colonoscopy starting at age 21.  FOLLOW-UP: Lastly, we discussed with Ms. Bright that cancer genetics is a rapidly advancing field and it is possible that new genetic tests will be appropriate for her and/or her family members in the future. We encouraged her to remain in contact with cancer genetics on an annual basis so we can update her personal and family histories and let her know of advances in cancer genetics that may benefit this family.   Our contact number was provided. Ms. Reuter questions were answered to her satisfaction, and she knows she is welcome to call us at anytime with additional questions or concerns.   Roma Kayser, [place], [facility] Licensed, Certified Genetic Counselor [email or web address]

## 2021-01-12 ENCOUNTER — Other Ambulatory Visit: Payer: Self-pay

## 2021-01-12 ENCOUNTER — Ambulatory Visit (HOSPITAL_COMMUNITY)
Admission: RE | Admit: 2021-01-12 | Discharge: 2021-01-12 | Disposition: A | Discharge status: Home / Self Care | Payer: Medicare Other | Source: Ambulatory Visit | Attending: Hematology and Oncology | Admitting: Hematology and Oncology

## 2021-01-12 ENCOUNTER — Inpatient Hospital Stay: Payer: Medicare Other

## 2021-01-12 ENCOUNTER — Inpatient Hospital Stay: Payer: Medicare Other | Attending: Hematology and Oncology

## 2021-01-12 DIAGNOSIS — C562 Malignant neoplasm of left ovary: Secondary | ICD-10-CM | POA: Insufficient documentation

## 2021-01-12 DIAGNOSIS — C569 Malignant neoplasm of unspecified ovary: Secondary | ICD-10-CM

## 2021-01-12 LAB — CMP (CANCER CENTER ONLY)
ALT: 22 U/L (ref 0–44)
AST: 23 U/L (ref 15–41)
Albumin: 3.9 g/dL (ref 3.5–5.0)
Alkaline Phosphatase: 83 U/L (ref 38–126)
Anion gap: 13 (ref 5–15)
BUN: 24 mg/dL — ABNORMAL HIGH (ref 8–23)
CO2: 29 mmol/L (ref 22–32)
Calcium: 9.5 mg/dL (ref 8.9–10.3)
Chloride: 101 mmol/L (ref 98–111)
Creatinine: 1.01 mg/dL — ABNORMAL HIGH (ref 0.44–1.00)
GFR, Estimated: 56 mL/min — ABNORMAL LOW (ref >60)
Glucose, Bld: 116 mg/dL — ABNORMAL HIGH (ref 70–99)
Potassium: 3.3 mmol/L — ABNORMAL LOW (ref 3.5–5.1)
Sodium: 143 mmol/L (ref 135–145)
Total Bilirubin: 0.4 mg/dL (ref 0.3–1.2)
Total Protein: 8 g/dL (ref 6.5–8.1)

## 2021-01-12 LAB — CBC WITH DIFFERENTIAL (CANCER CENTER ONLY)
Abs Immature Granulocytes: 0.01 10*3/uL (ref 0.00–0.07)
Basophils Absolute: 0 10*3/uL (ref 0.0–0.1)
Basophils Relative: 1 %
Eosinophils Absolute: 0.1 10*3/uL (ref 0.0–0.5)
Eosinophils Relative: 2 %
HCT: 31.6 % — ABNORMAL LOW (ref 36.0–46.0)
Hemoglobin: 10.3 g/dL — ABNORMAL LOW (ref 12.0–15.0)
Immature Granulocytes: 0 %
Lymphocytes Relative: 39 %
Lymphs Abs: 1.7 10*3/uL (ref 0.7–4.0)
MCH: 28.8 pg (ref 26.0–34.0)
MCHC: 32.6 g/dL (ref 30.0–36.0)
MCV: 88.3 fL (ref 80.0–100.0)
Monocytes Absolute: 0.6 10*3/uL (ref 0.1–1.0)
Monocytes Relative: 12 %
Neutro Abs: 2 10*3/uL (ref 1.7–7.7)
Neutrophils Relative %: 46 %
Platelet Count: 294 10*3/uL (ref 150–400)
RBC: 3.58 MIL/uL — ABNORMAL LOW (ref 3.87–5.11)
RDW: 17.4 % — ABNORMAL HIGH (ref 11.5–15.5)
WBC Count: 4.4 10*3/uL (ref 4.0–10.5)
nRBC: 0 % (ref 0.0–0.2)

## 2021-01-12 MED ORDER — IOHEXOL 300 MG/ML  SOLN
75.0000 mL | Freq: Once | INTRAMUSCULAR | Status: AC | PRN
  Administered 2021-01-12: 75 mL via INTRAVENOUS

## 2021-01-12 MED ORDER — SODIUM CHLORIDE 0.9% FLUSH
10.0000 mL | Freq: Once | INTRAVENOUS | Status: AC
Start: 2021-01-12 — End: 2021-01-12
  Administered 2021-01-12: 10 mL
  Filled 2021-01-12: qty 10

## 2021-01-12 MED ORDER — HEPARIN SOD (PORK) LOCK FLUSH 100 UNIT/ML IV SOLN
INTRAVENOUS | Status: AC
  Filled 2021-01-12: qty 5

## 2021-01-12 MED ORDER — HEPARIN SOD (PORK) LOCK FLUSH 100 UNIT/ML IV SOLN
500.0000 [IU] | Freq: Once | INTRAVENOUS | Status: AC
  Administered 2021-01-12: 500 [IU] via INTRAVENOUS

## 2021-01-13 ENCOUNTER — Inpatient Hospital Stay (HOSPITAL_BASED_OUTPATIENT_CLINIC_OR_DEPARTMENT_OTHER): Payer: Medicare Other | Admitting: Hematology and Oncology

## 2021-01-13 ENCOUNTER — Encounter: Payer: Self-pay | Admitting: Hematology and Oncology

## 2021-01-13 ENCOUNTER — Other Ambulatory Visit: Payer: Self-pay | Admitting: Hematology and Oncology

## 2021-01-13 DIAGNOSIS — K5909 Other constipation: Secondary | ICD-10-CM

## 2021-01-13 DIAGNOSIS — N183 Chronic kidney disease, stage 3 unspecified: Secondary | ICD-10-CM | POA: Diagnosis not present

## 2021-01-13 DIAGNOSIS — D6481 Anemia due to antineoplastic chemotherapy: Secondary | ICD-10-CM

## 2021-01-13 DIAGNOSIS — C562 Malignant neoplasm of left ovary: Secondary | ICD-10-CM

## 2021-01-13 DIAGNOSIS — T451X5A Adverse effect of antineoplastic and immunosuppressive drugs, initial encounter: Secondary | ICD-10-CM

## 2021-01-13 DIAGNOSIS — I2692 Saddle embolus of pulmonary artery without acute cor pulmonale: Secondary | ICD-10-CM

## 2021-01-13 LAB — CA 125: Cancer Antigen (CA) 125: 25.7 U/mL (ref 0.0–38.1)

## 2021-01-13 NOTE — Assessment & Plan Note (Signed)
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

## 2021-01-13 NOTE — Progress Notes (Signed)
[place] OFFICE PROGRESS NOTE  Patient Care Team: Amber Baton, MD as PCP - General (Internal Medicine)  ASSESSMENT & PLAN:  Ovarian CA, left [facility]) I have reviewed recent blood work and CT imaging with the patient and her daughter I gave them copies of test results Her tumor marker has normalized I have reviewed CT imaging extensively and disagree with interpretation by radiologist I have also reviewed the CT imaging with GYN surgeon Overall, she has no signs of residual disease I recommend close observation with history and physical examination every 3 months, alternating between myself and GYN surgeon as well as tumor marker monitoring She is educated signs and symptoms to watch out for cancer recurrence  We discussed the risk and benefits of port flushes and maintenance versus port removal  Anemia due to antineoplastic chemotherapy She has persistent anemia after chemotherapy, likely secondary to recent treatment She is not symptomatic Observe for now  CKD (chronic kidney disease), stage III She has intermittent elevated serum creatinine Observe for now  Acute pulmonary embolism ([place]) She was diagnosed with submassive PE at original presentation She needs to continue anticoagulation therapy for minimum 1 year before discontinuation  Other constipation CT imaging shows signs of constipation, that could explain her mild intermittent abdominal discomfort We discussed the importance of regular laxative therapy to avoid constipation   No orders of the defined types were placed in this encounter.   All questions were answered. The patient knows to call the clinic with any problems, questions or concerns. The total time spent in the appointment was 30 minutes encounter with patients including review of chart and various tests results, discussions about plan of care and coordination of care plan   Amber Lark, MD 01/13/2021 12:24 PM  INTERVAL HISTORY: Please  see below for problem oriented charting. She returns for further follow-up with her daughter Her energy level is excellent since discontinuation of chemotherapy She complained of mild intermittent abdominal discomfort but had large bowel movement after CT imaging The patient denies any recent signs or symptoms of bleeding such as spontaneous epistaxis, hematuria or hematochezia. 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  CT imaging also revealed abdominal mass.  Subsequent biopsy confirmed diagnosis of ovarian cancer   05/21/2020 Procedure   Successful ultrasound guided diagnostic and therapeutic left thoracentesis yielding 580 cc of pleural fluid.   05/21/2020 Imaging   1. Large saddle pulmonary embolus with CT evidence of right heart strain (RV/LV ratio of 1.6) consistent with at least submassive (intermediate risk) PE. The presence of right heart strain has been associated with an increased risk of morbidity and mortality. 2. Large left and small right pleural effusions. 3. Large amount of peritoneal nodularity, consistent with peritoneal carcinomatosis. 4. Large midline pelvic mass, measuring up to 12.9 x 8.7 x 9.7 cm. 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   05/21/2020 Pathology Results   Clinical History: None provided  Specimen Submitted:  A. PLEURAL FLUID,LEFT, THORACENTESIS:    FINAL MICROSCOPIC  DIAGNOSIS:  - Reactive mesothelial cells present   05/24/2020 Procedure   1.  Successful placement of a right internal jugular approach power injectable Port-A-Cath. The catheter is ready for immediate use. 2. Successful ultrasound-guided paracentesis yielding 2.2 L of serous, slightly blood tinged ascitic fluid.   05/24/2020 Pathology Results   A. OMENTUM, NEEDLE CORE BIOPSY:  -  High-grade carcinoma  -  See comment   COMMENT:   By immunohistochemistry, the neoplastic cells are positive for cytokeratin 7 and PAX 8 but negative for cytokeratin 20, CDX2 and TTF-1. 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    05/30/2020 Cancer Staging   Staging form: Ovary, Fallopian Tube, and Primary Peritoneal Carcinoma, AJCC 8th Edition - Clinical stage from 05/30/2020: FIGO Stage IIIC (cT3, cN0, cM0) - Signed by Amber Lark, MD on 05/30/2020   06/10/2020 Tumor Marker   Patient's tumor was tested for the following markers: CA-125. Results of the tumor marker test revealed 27,432.   06/14/2020 - 12/08/2020 Chemotherapy   The patient had carboplatin and taxol for chemotherapy treatment.     07/04/2020 Tumor Marker   Patient's tumor was tested for the following markers: CA-125 Results of the tumor marker test revealed [id number]   08/29/2020 Imaging   CT chest:   1. No evidence of thoracic metastasis. 2. Resolution of proximal pulmonary emboli.   CT abdomen pelvis:   1. Marked reduction in volume of bulky pelvic mass. Nodularity remains along the uterine fundus. 2. Marked reduction in the nodular portions of the omental thickening. Cystic portions of the omental metastasis remain. 3. Decrease in volume of free fluid in the abdomen pelvis. 4. No new nodularity in the peritoneal space.     08/30/2020 Tumor Marker   Patient's tumor was tested for the following markers: CA-125 Results of the tumor marker test revealed 359   10/04/2020 Surgery   Operation: Exlap, radical tumor debulking including omentectomy,  BSO, trachelectomy   Surgeon: [name]    Operative Findings: On EUA, cervix not definitively appreciated. Nodularity and somewhat fixed mass noted in the cul de sac. On intra-abdominal entry, normal and smooth diaphragm, stomach, liver edge. Omentum densely adherent to the transverse colon with two 3-5cm cystic masses vs treated tumor within the infracolic portion of the mass.  One of these masses was densely adherent to the anterior abdominal wall in the left upper abdomen.  Otherwise, omentum without evidence of disease. Small bowel normal in appearance. No adenopathy. No ascites. Bilateral tubes normal appearing. Ovaries in conglomerate mass with cervix. Mass densely adherent to the rectum posteriorly. Bladder adherent anteriorly to the cervix and superior to the cervix/ovarian mass conglomeration. After resection of the cervix and bilateral adnexa, the remaining nodules were able to be removed from the rectum.  Bubble test was negative for disruption of the rectum.  No palpable or visible tumor at the end of surgery (R0 resection)     10/04/2020 Pathology Results   SURGICAL PATHOLOGY  CASE: [id number]  PATIENT: Amber Gray  Surgical Pathology Report   FINAL MICROSCOPIC DIAGNOSIS:   A. 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   B. 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  - Tumor involves left ovary, right fallopian tube, uterine serosa, uterine wall (myometrium) and rectal implant.  -  See oncology table.   ONCOLOGY TABLE:   OVARY or FALLOPIAN TUBE or PRIMARY PERITONEUM: Resection   Procedure: Total hysterectomy and bilateral salpingo-oophorectomy,  Excision of rectal implant  Specimen Integrity: Intact  Tumor Site:  Left ovary  Tumor Size: Multifocal, largest focus measures 2.1 cm  Histologic Type: Serous carcinoma  Histologic Grade: High grade  Ovarian Surface Involvement: Present, left ovary  Fallopian Tube Surface Involvement: Present, right fallopian tube  Implants: Present, rectum  Other Tissue/ Organ Involvement: Uterine serosa, uterine wall  (myometrium)  Largest Extrapelvic Peritoneal Focus: 5 cm  Peritoneal/Ascitic Fluid Involvement: Not applicable  Chemotherapy Response Score (CRS): CRS2 (moderate response)  Regional Lymph Nodes: Not applicable (no regional lymph nodes submitted  or found)  Distant Metastasis:       Distant Site(s) Involved: Not applicable  Pathologic Stage Classification (pTNM, AJCC 8th Edition): ypT3c, pN not assigned  Additional Findings: Mild acute and chronic cervicitis.  Attenuated inactive endometrium with scattered cystically dilated glands.  Leiomyomata.    11/02/2020 Tumor Marker   Patient's tumor was tested for the following markers: CA-125 Results of the tumor marker test revealed 29.9.   12/09/2020 Tumor Marker   Patient's tumor was tested for the following markers: CA-125 Results of the tumor marker test revealed 22.1   12/12/2020 Genetic Testing   Negative genetic testing on the CancerNext panel.  The CancerNext gene panel offered by [employer] includes sequencing and rearrangement analysis for the following 34 genes:   APC, ATM, BARD1, BMPR1A, BRCA1, BRCA2, BRIP1, CDH1, CDK4, CDKN2A, CHEK2, DICER1, HOXB13, EPCAM, GREM1, MLH1, MRE11A, MSH2, MSH6, MUTYH, NBN, NF1, PALB2, PMS2, POLD1, POLE, PTEN, RAD50, RAD51C, RAD51D, SMAD4, SMARCA4, STK11, and TP53.    Negative genetic testing on the HRD tumor testing, suggesting that the patient's tumor will not be more sensitive to PARP inhibitors.  The report date is December 12, 2020.   01/12/2021 Tumor Marker   Patient's tumor was tested for the following markers: CA-125 Results of the tumor marker test revealed  25.7   01/13/2021 Imaging   1. Status post interval hysterectomy and bilateral oophorectomy. 2. Status post interval omentectomy, with excision of previously noted cystic lesions along the ventral left hemiabdomen. 3. There is persistent nodularity and peritoneal thickening in the left upper quadrant, consistent with unchanged peritoneal metastatic disease. 4. No evidence of lymphadenopathy or new metastatic disease in the abdomen or pelvis on this postoperative baseline 5. Moderate hiatal hernia with intrathoracic position of the gastric fundus.   Aortic Atherosclerosis (ICD10-I70.0).     REVIEW OF SYSTEMS:   Constitutional: Denies fevers, chills  Eyes: Denies blurriness of vision Ears, nose, mouth, throat, and face: Denies mucositis or sore throat Respiratory: Denies cough, dyspnea or wheezes Cardiovascular: Denies palpitation, chest discomfort or lower extremity swelling Skin: Denies abnormal skin rashes Lymphatics: Denies new lymphadenopathy or easy bruising Neurological:Denies numbness, tingling or new weaknesses Behavioral/Psych: Mood is stable, no new changes  All other systems were reviewed with the patient and are negative.  I have reviewed the past medical history, past surgical history, social history and family history with the patient and they are unchanged from previous note.  ALLERGIES:  is allergic to metformin and related.  MEDICATIONS:  Current Outpatient Medications  Medication Sig Dispense Refill  . apixaban (ELIQUIS) 2.5 MG TABS tablet Take 2.5 mg by mouth 2 (two) times daily.    Marland Kitchen acetaminophen (TYLENOL) 325 MG tablet Take 650 mg by mouth every 6 (six) hours as needed for mild pain.    Marland Kitchen  empagliflozin (JARDIANCE) 10 MG TABS tablet Take 10 mg by mouth every other day.    . furosemide (LASIX) 40 MG tablet Take 40 mg by mouth daily.   11  . levothyroxine (SYNTHROID) 88 MCG tablet Take 88 mcg by mouth daily before breakfast.     . lidocaine-prilocaine (EMLA)  cream Apply 1 application topically daily as needed. (Patient not taking: No sig reported) 30 g 11  . LINZESS 72 MCG capsule Take 72 mcg by mouth daily as needed (Constipation).    . melatonin 5 MG TABS Take 5 mg by mouth at bedtime as needed (Sleep).    Marland Kitchen omeprazole (PRILOSEC) 20 MG capsule Take 20 mg by mouth daily. (Patient not taking: No sig reported)    . senna-docusate (SENOKOT-S) 8.6-50 MG tablet Take 2 tablets by mouth at bedtime. For AFTER surgery, do not take if having diarrhea 30 tablet 0  . sertraline (ZOLOFT) 50 MG tablet Take 50 mg by mouth daily.  0  . simvastatin (ZOCOR) 40 MG tablet Take 40 mg by mouth daily.  3  . TRADJENTA 5 MG TABS tablet Take 5 mg by mouth daily after supper.  3   No current facility-administered medications for this visit.    PHYSICAL EXAMINATION: ECOG PERFORMANCE STATUS: 1 - Symptomatic but completely ambulatory  Vitals:   01/13/21 1206  BP: (!) 132/59  Pulse: (!) 51  Resp: 18  Temp: 97.8 F (36.6 C)  SpO2: 100%   Filed Weights   01/13/21 1206  Weight: 94 lb 12.8 oz (43 kg)    GENERAL:alert, no distress and comfortable Musculoskeletal:no cyanosis of digits and no clubbing  NEURO: alert & oriented x 3 with fluent speech, no focal motor/sensory deficits  LABORATORY DATA:  I have reviewed the data as listed    Component Value Date/Time   NA 143 01/12/2021 1226   K 3.3 (L) 01/12/2021 1226   CL 101 01/12/2021 1226   CO2 29 01/12/2021 1226   GLUCOSE 116 (H) 01/12/2021 1226   BUN 24 (H) 01/12/2021 1226   CREATININE 1.01 (H) 01/12/2021 1226   CALCIUM 9.5 01/12/2021 1226   PROT 8.0 01/12/2021 1226   ALBUMIN 3.9 01/12/2021 1226   AST 23 01/12/2021 1226   ALT 22 01/12/2021 1226   ALKPHOS 83 01/12/2021 1226   BILITOT 0.4 01/12/2021 1226   GFRNONAA 56 (L) 01/12/2021 1226   GFRAA 59 (L) 07/04/2020 1217    No results found for: SPEP, UPEP  Lab Results  Component Value Date   WBC 4.4 01/12/2021   NEUTROABS 2.0 01/12/2021   HGB 10.3  (L) 01/12/2021   HCT 31.6 (L) 01/12/2021   MCV 88.3 01/12/2021   PLT 294 01/12/2021      Chemistry      Component Value Date/Time   NA 143 01/12/2021 1226   K 3.3 (L) 01/12/2021 1226   CL 101 01/12/2021 1226   CO2 29 01/12/2021 1226   BUN 24 (H) 01/12/2021 1226   CREATININE 1.01 (H) 01/12/2021 1226      Component Value Date/Time   CALCIUM 9.5 01/12/2021 1226   ALKPHOS 83 01/12/2021 1226   AST 23 01/12/2021 1226   ALT 22 01/12/2021 1226   BILITOT 0.4 01/12/2021 1226       RADIOGRAPHIC STUDIES: I have reviewed multiple imaging studies with the patient and her daughter I have personally reviewed the radiological images as listed and agreed with the findings in the report. CT ABDOMEN PELVIS W CONTRAST  Result Date: 01/13/2021  CLINICAL DATA:  Left ovarian cancer, assess treatment response, status post hysterectomy 10/04/2020, ongoing chemotherapy EXAM: CT ABDOMEN AND PELVIS WITH CONTRAST TECHNIQUE: Multidetector CT imaging of the abdomen and pelvis was performed using the standard protocol following bolus administration of intravenous contrast. CONTRAST:  102m OMNIPAQUE IOHEXOL 300 MG/ML SOLN, additional oral enteric contrast COMPARISON:  CT abdomen pelvis, 08/29/2020 FINDINGS: Lower chest: No acute abnormality. Moderate hiatal hernia with intrathoracic position of the gastric fundus. Hepatobiliary: No solid liver abnormality is seen. No gallstones, gallbladder wall thickening, or biliary dilatation. Pancreas: Unremarkable. No pancreatic ductal dilatation or surrounding inflammatory changes. Spleen: Normal in size without significant abnormality. Adrenals/Urinary Tract: Adrenal glands are unremarkable. Kidneys are normal, without renal calculi, solid lesion, or hydronephrosis. Bladder is unremarkable. Stomach/Bowel: Stomach is within normal limits. Appendix is not clearly visualized. No evidence of bowel wall thickening, distention, or inflammatory changes. Generally large burden of stool  throughout the colon and rectum, stool balls in the rectum. Status post interval omentectomy, with excision of previously noted cystic lesions along the ventral left hemiabdomen (series 2, image 51). Vascular/Lymphatic: Scattered aortic atherosclerosis. No enlarged abdominal or pelvic lymph nodes. Reproductive: Status post interval hysterectomy and bilateral oophorectomy. Other: No abdominal wall hernia or abnormality. No abdominopelvic ascites. There is persistent nodularity and peritoneal thickening in the left upper quadrant (series 2, image 22). Musculoskeletal: No acute or significant osseous findings. IMPRESSION: 1. Status post interval hysterectomy and bilateral oophorectomy. 2. Status post interval omentectomy, with excision of previously noted cystic lesions along the ventral left hemiabdomen. 3. There is persistent nodularity and peritoneal thickening in the left upper quadrant, consistent with unchanged peritoneal metastatic disease. 4. No evidence of lymphadenopathy or new metastatic disease in the abdomen or pelvis on this postoperative baseline 5. Moderate hiatal hernia with intrathoracic position of the gastric fundus. Aortic Atherosclerosis (ICD10-I70.0). Electronically Signed   By: AEddie CandleM.D.   On: 01/13/2021 09:18

## 2021-01-13 NOTE — Assessment & Plan Note (Signed)
She has intermittent elevated serum creatinine Observe for now

## 2021-01-13 NOTE — Assessment & Plan Note (Signed)
CT imaging shows signs of constipation, that could explain her mild intermittent abdominal discomfort We discussed the importance of regular laxative therapy to avoid constipation

## 2021-01-13 NOTE — Assessment & Plan Note (Signed)
She was diagnosed with submassive PE at original presentation She needs to continue anticoagulation therapy for minimum 1 year before discontinuation

## 2021-01-13 NOTE — Assessment & Plan Note (Addendum)
I have reviewed recent blood work and CT imaging with the patient and her daughter I gave them copies of test results Her tumor marker has normalized I have reviewed CT imaging extensively and disagree with interpretation by radiologist I have also reviewed the CT imaging with GYN surgeon Overall, she has no signs of residual disease I recommend close observation with history and physical examination every 3 months, alternating between myself and GYN surgeon as well as tumor marker monitoring She is educated signs and symptoms to watch out for cancer recurrence  We discussed the risk and benefits of port flushes and maintenance versus port removal

## 2021-01-16 IMAGING — CT CT CHEST-ABD-PELV W/ CM
3 of 5 series · 15 of 36 positions shown, 17 images · IV contrast (OMNIPAQUE)
Comparison: None.

CLINICAL DATA: High-grade carcinoma with bulky pelvic mass and
omental metastasis.

EXAM:
CT CHEST, ABDOMEN, AND PELVIS WITH CONTRAST
TECHNIQUE: Multidetector CT imaging of the chest, abdomen and pelvis was
performed following the standard protocol during bolus
administration of intravenous contrast.
CONTRAST:  100mL OMNIPAQUE IOHEXOL 300 MG/ML  SOLN

[Series 2: cap with · axial · 0.70mm/px · z∈[+1273,+1748]mm · 10 of 117 slices shown, 12 images]
[im 11/117  mediastinal]
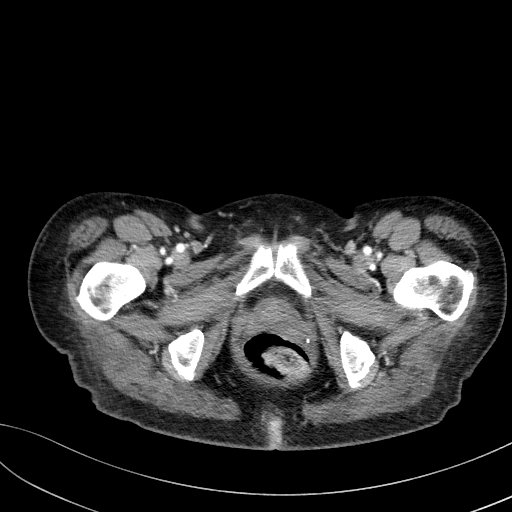
[im 11/117  bone]
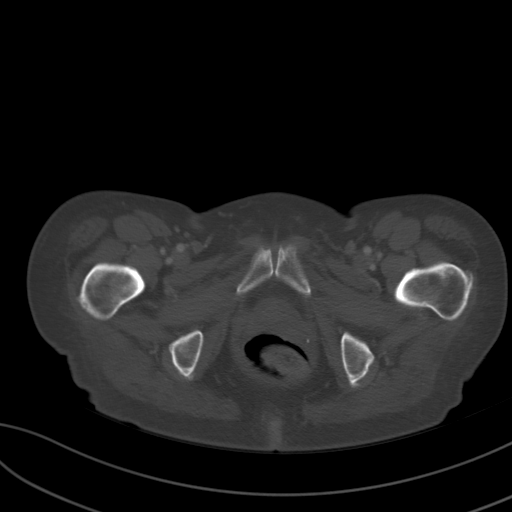
[im 22/117  mediastinal]
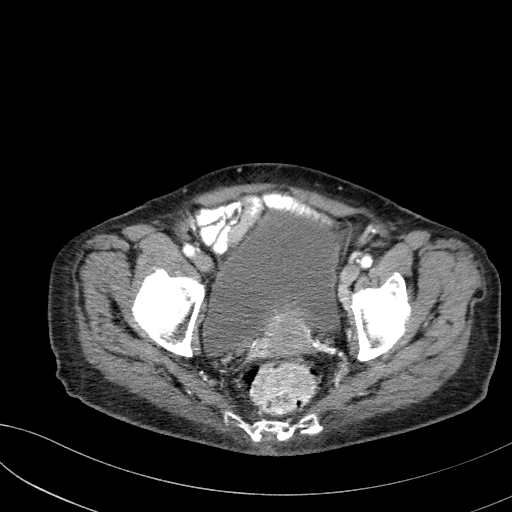
[im 32/117  mediastinal]
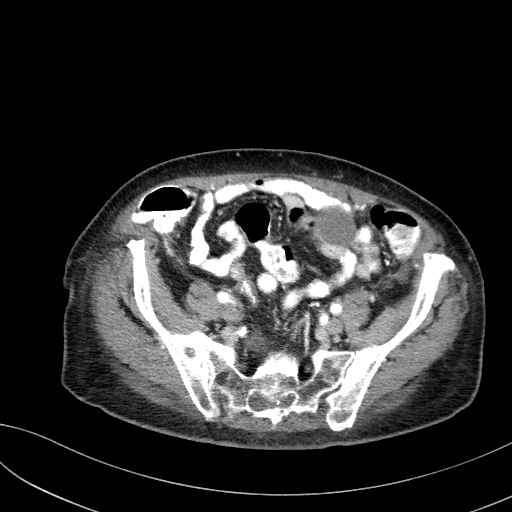
[im 43/117  mediastinal]
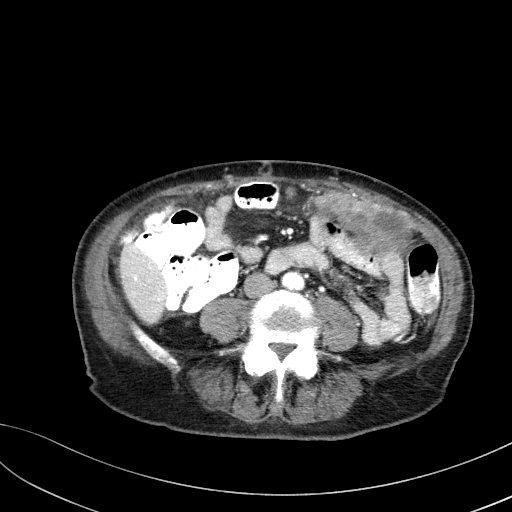
[im 53/117  mediastinal]
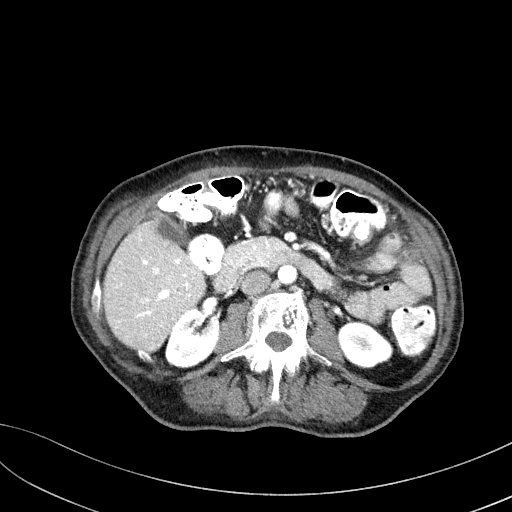
[im 64/117  mediastinal]
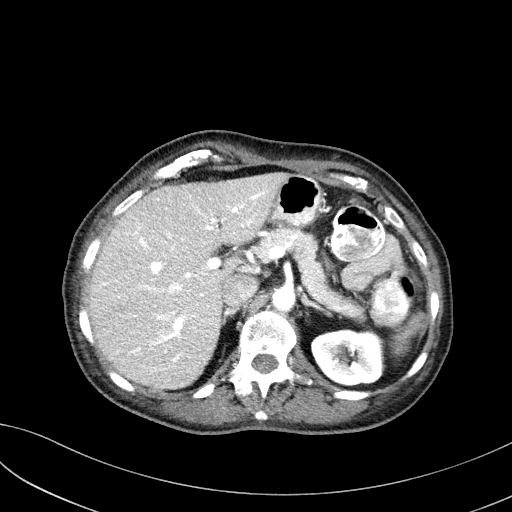
[im 74/117  mediastinal]
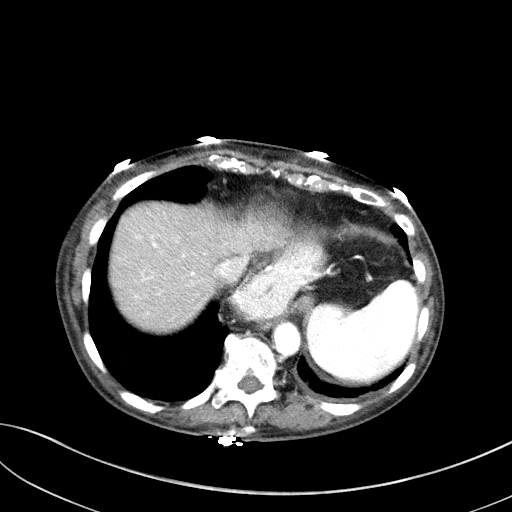
[im 85/117  mediastinal]
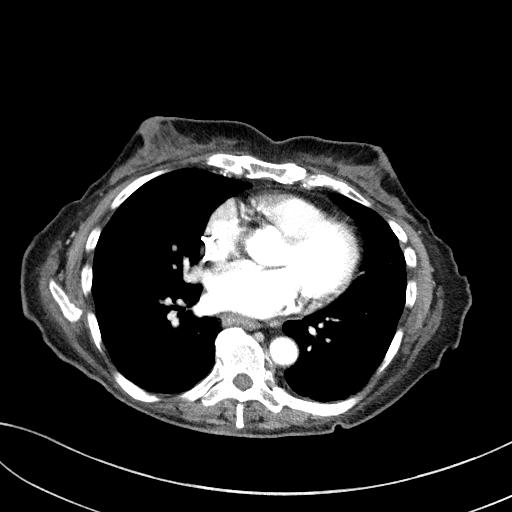
[im 95/117  mediastinal]
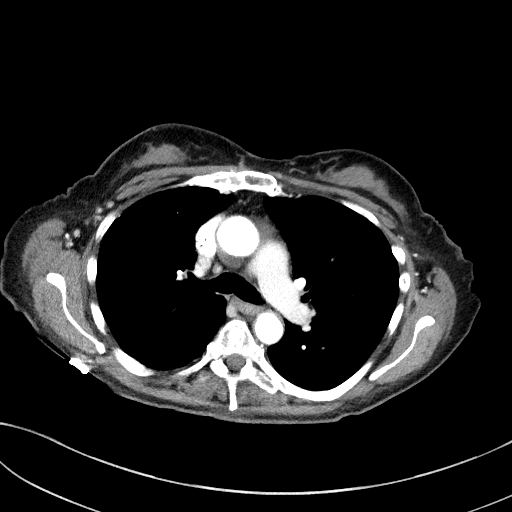
[im 95/117  bone]
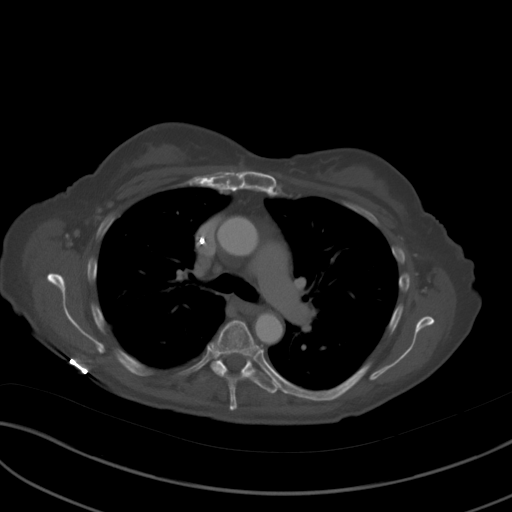
[im 106/117  mediastinal]
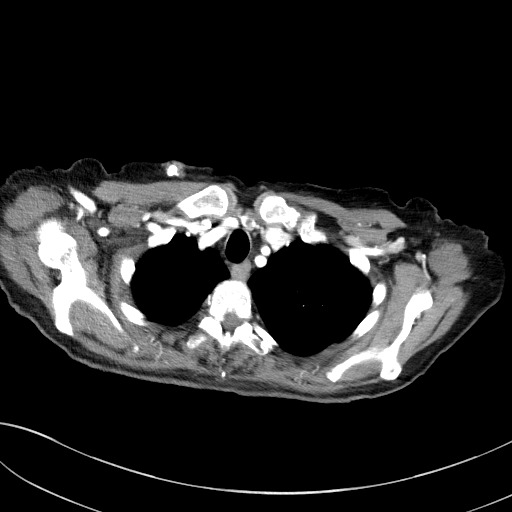

[Series 4: coronals · coronal · 1.00mm/px · 3 of 105 slices shown]
[im 21/105  mediastinal]
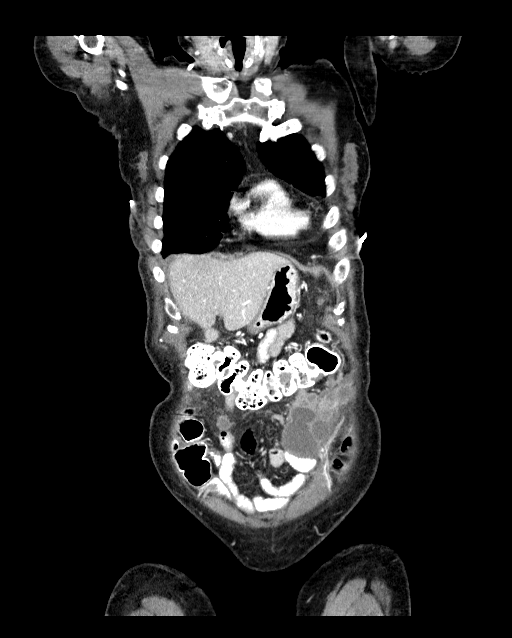
[im 42/105  mediastinal]
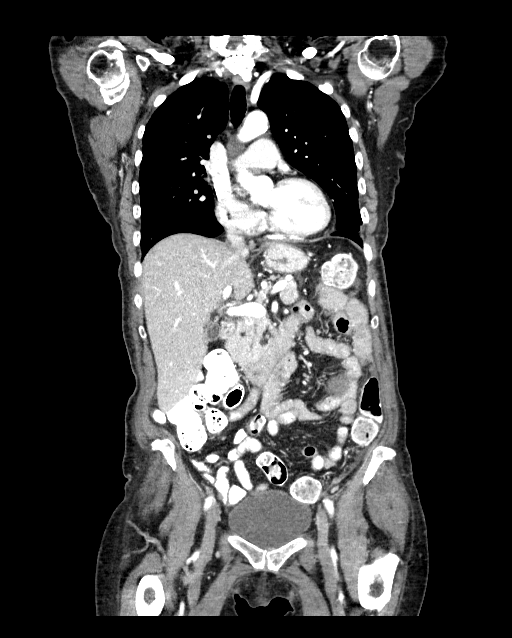
[im 63/105  mediastinal]
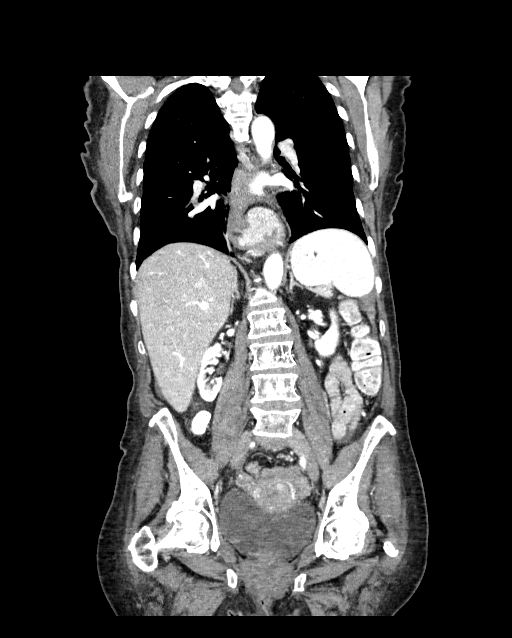

[Series 6: lung · axial · 0.70mm/px · z∈[+1553,+1593]mm · 2 of 124 slices shown]
[im 11/124  bone]
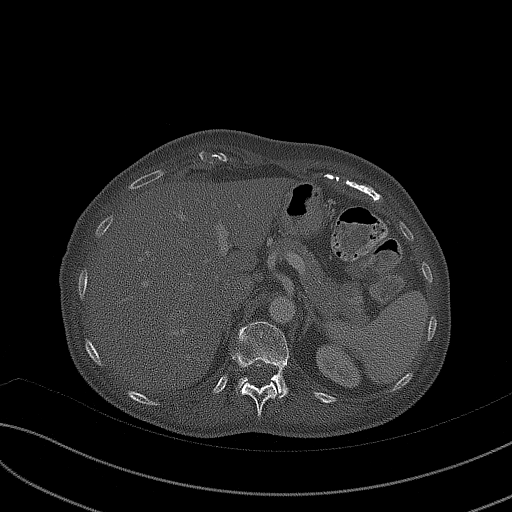
[im 31/124  bone]
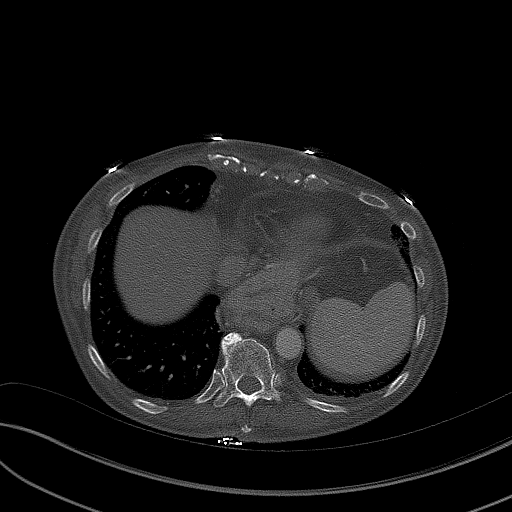

[15 of 36 positions shown; findings below may reference images not displayed]

FINDINGS: CT CHEST FINDINGS

Cardiovascular: Resolution of the proximal pulmonary emboli
demonstrated in the main pulmonary arteries on comparison exam. No
pericardial fluid. Heart is normal.

Mediastinum/Nodes: No axillary supraclavicular adenopathy no
mediastinal adenopathy. Large hiatal hernia present.

Lungs/Pleura: No suspicious pulmonary nodules

Musculoskeletal: Degenerative spurring of the spine. No aggressive
osseous lesion

CT ABDOMEN AND PELVIS FINDINGS

Hepatobiliary: No focal hepatic lesion. No biliary ductal
dilatation. Gallbladder is normal. Common bile duct is normal.

Pancreas: Pancreas is normal. No ductal dilatation. No pancreatic
inflammation.

Spleen: Normal spleen

Adrenals/urinary tract: Adrenal glands and kidneys are normal. The
ureters and bladder normal.

Stomach/Bowel: Stomach, small-bowel and cecum are normal. The
appendix is not identified but there is no pericecal inflammation to
suggest appendicitis. The colon and rectosigmoid colon are normal.

Vascular/Lymphatic: Abdominal aorta normal caliber. No
retroperitoneal adenopathy.

Reproductive: Marked reduction in the bulky pelvic mass now
measuring 7.4 x 3.4 cm (image 91/series 2) decreased from 13 cm by
8.7 cm. Remaining nodularity resides along the uterine fundus.
Ovaries not identified.

Other: Marked reduction of the peritoneal nodularity in the LEFT
ventral abdominal wall . The nodular portions of the omental
thickening are significantly reduced. The cystic portion of the
omental metastasis are persistent. For example LEFT mid abdomen
ventral cystic collection measuring 5 cm image 73/2 compares to
cm. The nodular portion previously seen lateral along the cystic
portion has since resolved.

Likewise more inferior ventral peritoneal space cystic lesion
measuring 6.3 cm (image 82/2) compares to 6.3 cm. The nodule portion
along the ventral aspect the cystic mass is significantly decreased.

No new nodularity in the peritoneal space.

Interval reduction in the free fluid along the RIGHT pericolic
gutter liver.

Musculoskeletal: No aggressive osseous lesion.
IMPRESSION: CT chest:

1. No evidence of thoracic metastasis.
2. Resolution of proximal pulmonary emboli.

CT abdomen pelvis:

1. Marked reduction in volume of bulky pelvic mass. Nodularity
remains along the uterine fundus.
2. Marked reduction in the nodular portions of the omental
thickening. Cystic portions of the omental metastasis remain.
3. Decrease in volume of free fluid in the abdomen pelvis.
4. No new nodularity in the peritoneal space.

## 2021-01-31 ENCOUNTER — Telehealth: Payer: Self-pay | Admitting: Oncology

## 2021-01-31 NOTE — Telephone Encounter (Signed)
Blanch Media (daughter) called and asked if Shaquna should be having labs drawn at her port flush appointment on 02/23/21 and 05/18/21.  She would also like to move the appointments to Friday's during the summer if possible due to her work schedule.

## 2021-01-31 NOTE — Telephone Encounter (Signed)
Yes to add labs As long as the port is flushed at least within 8 weeks we can move her appt to accommodate her

## 2021-01-31 NOTE — Telephone Encounter (Signed)
Called Blanch Media back with new appointments.  She verbalized understanding and agreement.

## 2021-02-23 ENCOUNTER — Other Ambulatory Visit: Payer: Medicare Other

## 2021-02-24 ENCOUNTER — Other Ambulatory Visit: Payer: Self-pay

## 2021-02-24 ENCOUNTER — Telehealth: Payer: Self-pay | Admitting: *Deleted

## 2021-02-24 ENCOUNTER — Inpatient Hospital Stay: Payer: Medicare Other | Attending: Hematology and Oncology

## 2021-02-24 DIAGNOSIS — C569 Malignant neoplasm of unspecified ovary: Secondary | ICD-10-CM

## 2021-02-24 DIAGNOSIS — C561 Malignant neoplasm of right ovary: Secondary | ICD-10-CM | POA: Insufficient documentation

## 2021-02-24 DIAGNOSIS — C562 Malignant neoplasm of left ovary: Secondary | ICD-10-CM

## 2021-02-24 LAB — CMP (CANCER CENTER ONLY)
ALT: 12 U/L (ref 0–44)
AST: 19 U/L (ref 15–41)
Albumin: 3.4 g/dL — ABNORMAL LOW (ref 3.5–5.0)
Alkaline Phosphatase: 72 U/L (ref 38–126)
Anion gap: 10 (ref 5–15)
BUN: 27 mg/dL — ABNORMAL HIGH (ref 8–23)
CO2: 25 mmol/L (ref 22–32)
Calcium: 8.9 mg/dL (ref 8.9–10.3)
Chloride: 106 mmol/L (ref 98–111)
Creatinine: 0.89 mg/dL (ref 0.44–1.00)
GFR, Estimated: >60 mL/min (ref >60)
Glucose, Bld: 101 mg/dL — ABNORMAL HIGH (ref 70–99)
Potassium: 4.3 mmol/L (ref 3.5–5.1)
Sodium: 141 mmol/L (ref 135–145)
Total Bilirubin: 0.3 mg/dL (ref 0.3–1.2)
Total Protein: 7.2 g/dL (ref 6.5–8.1)

## 2021-02-24 LAB — CBC WITH DIFFERENTIAL (CANCER CENTER ONLY)
Abs Immature Granulocytes: 0.04 10*3/uL (ref 0.00–0.07)
Basophils Absolute: 0 10*3/uL (ref 0.0–0.1)
Basophils Relative: 0 %
Eosinophils Absolute: 5.3 10*3/uL — ABNORMAL HIGH (ref 0.0–0.5)
Eosinophils Relative: 45 %
HCT: 33 % — ABNORMAL LOW (ref 36.0–46.0)
Hemoglobin: 10.7 g/dL — ABNORMAL LOW (ref 12.0–15.0)
Immature Granulocytes: 0 %
Lymphocytes Relative: 16 %
Lymphs Abs: 1.8 10*3/uL (ref 0.7–4.0)
MCH: 29.4 pg (ref 26.0–34.0)
MCHC: 32.4 g/dL (ref 30.0–36.0)
MCV: 90.7 fL (ref 80.0–100.0)
Monocytes Absolute: 0.5 10*3/uL (ref 0.1–1.0)
Monocytes Relative: 4 %
Neutro Abs: 4 10*3/uL (ref 1.7–7.7)
Neutrophils Relative %: 35 %
Platelet Count: 181 10*3/uL (ref 150–400)
RBC: 3.64 MIL/uL — ABNORMAL LOW (ref 3.87–5.11)
RDW: 16.6 % — ABNORMAL HIGH (ref 11.5–15.5)
WBC Count: 11.7 10*3/uL — ABNORMAL HIGH (ref 4.0–10.5)
nRBC: 0 % (ref 0.0–0.2)

## 2021-02-24 MED ORDER — SODIUM CHLORIDE 0.9% FLUSH
10.0000 mL | Freq: Once | INTRAVENOUS | Status: AC
  Administered 2021-02-24: 10 mL
  Filled 2021-02-24: qty 10

## 2021-02-24 MED ORDER — HEPARIN SOD (PORK) LOCK FLUSH 100 UNIT/ML IV SOLN
500.0000 [IU] | Freq: Once | INTRAVENOUS | Status: AC
  Administered 2021-02-24: 500 [IU]
  Filled 2021-02-24: qty 5

## 2021-02-24 NOTE — Telephone Encounter (Signed)
Called and left the patient a message to call the office back yesterday and today. Patient needs to be scheduled to see Dr Denman George in July

## 2021-02-25 LAB — CA 125: Cancer Antigen (CA) 125: 46.7 U/mL — ABNORMAL HIGH (ref 0.0–38.1)

## 2021-02-28 ENCOUNTER — Telehealth: Payer: Self-pay

## 2021-02-28 ENCOUNTER — Encounter: Payer: Self-pay | Admitting: Hematology and Oncology

## 2021-02-28 NOTE — Telephone Encounter (Signed)
Attempted to contact patient regarding her recent CA 125 result and to setup an appointment with Dr. Berline Lopes. Unable to leave voicemail. Will attempt to call again.

## 2021-02-28 NOTE — Telephone Encounter (Signed)
Spoke with patient regarding her recent labs and explained that Alvy Bimler would like her to follow up with Dr. Berline Lopes. Scheduled appointment for Friday 6/10. Patient requested a Friday so her daughter could drive her. Patient verbalized understanding and will call if she has any questions.

## 2021-03-01 ENCOUNTER — Telehealth: Payer: Self-pay

## 2021-03-01 NOTE — Telephone Encounter (Signed)
Unable to get in touch with [facility] or leave voicemail. Reached out to daughter Blanch Media and Patent attorney.

## 2021-03-01 NOTE — Telephone Encounter (Signed)
Spoke with Patients daughter Blanch Media about the elevated WBC. Blanch Media said a few days prior to her mom having the labs drawn that she had some sort of reaction. Blanch Media stated that her moms face was swollen, including eyes, sinus and jaw line. The patient also had red patches/rash on her stomach and bra line. Blanch Media said it has improved with claritin. Notified Joylene John, NP of symptoms. Advised if symptoms are persistent or getting worse to follow up with PCP. We will draw labs before her appointment on 6/10. Blanch Media verbalized understanding and will call with any questions or concerns.

## 2021-03-01 NOTE — Telephone Encounter (Signed)
Attempted to call Amber Gray regarding her recent CBC results and to setup a lab appointment prior to her appointment on 6/10. Unable to leave voicemail, will try to call again later.

## 2021-03-10 ENCOUNTER — Other Ambulatory Visit: Payer: Self-pay | Admitting: Gynecologic Oncology

## 2021-03-10 ENCOUNTER — Encounter: Payer: Self-pay | Admitting: Gynecologic Oncology

## 2021-03-10 ENCOUNTER — Inpatient Hospital Stay: Payer: Medicare Other | Attending: Hematology and Oncology

## 2021-03-10 ENCOUNTER — Inpatient Hospital Stay (HOSPITAL_BASED_OUTPATIENT_CLINIC_OR_DEPARTMENT_OTHER): Payer: Medicare Other | Admitting: Gynecologic Oncology

## 2021-03-10 ENCOUNTER — Other Ambulatory Visit: Payer: Self-pay

## 2021-03-10 VITALS — BP 129/67 | HR 60 | Temp 97.4°F | Resp 16 | Ht 60.0 in | Wt 101.4 lb

## 2021-03-10 DIAGNOSIS — Z7901 Long term (current) use of anticoagulants: Secondary | ICD-10-CM | POA: Insufficient documentation

## 2021-03-10 DIAGNOSIS — E7800 Pure hypercholesterolemia, unspecified: Secondary | ICD-10-CM | POA: Diagnosis not present

## 2021-03-10 DIAGNOSIS — M1990 Unspecified osteoarthritis, unspecified site: Secondary | ICD-10-CM | POA: Diagnosis not present

## 2021-03-10 DIAGNOSIS — Z79899 Other long term (current) drug therapy: Secondary | ICD-10-CM | POA: Insufficient documentation

## 2021-03-10 DIAGNOSIS — C786 Secondary malignant neoplasm of retroperitoneum and peritoneum: Secondary | ICD-10-CM | POA: Insufficient documentation

## 2021-03-10 DIAGNOSIS — F419 Anxiety disorder, unspecified: Secondary | ICD-10-CM | POA: Insufficient documentation

## 2021-03-10 DIAGNOSIS — I10 Essential (primary) hypertension: Secondary | ICD-10-CM | POA: Insufficient documentation

## 2021-03-10 DIAGNOSIS — C569 Malignant neoplasm of unspecified ovary: Secondary | ICD-10-CM

## 2021-03-10 DIAGNOSIS — Z86718 Personal history of other venous thrombosis and embolism: Secondary | ICD-10-CM | POA: Diagnosis not present

## 2021-03-10 DIAGNOSIS — F32A Depression, unspecified: Secondary | ICD-10-CM | POA: Insufficient documentation

## 2021-03-10 DIAGNOSIS — C562 Malignant neoplasm of left ovary: Secondary | ICD-10-CM | POA: Insufficient documentation

## 2021-03-10 DIAGNOSIS — R971 Elevated cancer antigen 125 [CA 125]: Secondary | ICD-10-CM

## 2021-03-10 DIAGNOSIS — E039 Hypothyroidism, unspecified: Secondary | ICD-10-CM | POA: Diagnosis not present

## 2021-03-10 DIAGNOSIS — Z90710 Acquired absence of both cervix and uterus: Secondary | ICD-10-CM | POA: Insufficient documentation

## 2021-03-10 DIAGNOSIS — Z7984 Long term (current) use of oral hypoglycemic drugs: Secondary | ICD-10-CM | POA: Diagnosis not present

## 2021-03-10 DIAGNOSIS — Z9079 Acquired absence of other genital organ(s): Secondary | ICD-10-CM | POA: Insufficient documentation

## 2021-03-10 DIAGNOSIS — Z9221 Personal history of antineoplastic chemotherapy: Secondary | ICD-10-CM | POA: Insufficient documentation

## 2021-03-10 DIAGNOSIS — Z86711 Personal history of pulmonary embolism: Secondary | ICD-10-CM | POA: Insufficient documentation

## 2021-03-10 DIAGNOSIS — E119 Type 2 diabetes mellitus without complications: Secondary | ICD-10-CM | POA: Insufficient documentation

## 2021-03-10 DIAGNOSIS — K219 Gastro-esophageal reflux disease without esophagitis: Secondary | ICD-10-CM | POA: Diagnosis not present

## 2021-03-10 DIAGNOSIS — R3915 Urgency of urination: Secondary | ICD-10-CM | POA: Insufficient documentation

## 2021-03-10 DIAGNOSIS — Z90722 Acquired absence of ovaries, bilateral: Secondary | ICD-10-CM | POA: Diagnosis not present

## 2021-03-10 LAB — CMP (CANCER CENTER ONLY)
ALT: 16 U/L (ref 0–44)
AST: 21 U/L (ref 15–41)
Albumin: 3.8 g/dL (ref 3.5–5.0)
Alkaline Phosphatase: 87 U/L (ref 38–126)
Anion gap: 9 (ref 5–15)
BUN: 29 mg/dL — ABNORMAL HIGH (ref 8–23)
CO2: 26 mmol/L (ref 22–32)
Calcium: 9.4 mg/dL (ref 8.9–10.3)
Chloride: 106 mmol/L (ref 98–111)
Creatinine: 1.2 mg/dL — ABNORMAL HIGH (ref 0.44–1.00)
GFR, Estimated: 46 mL/min — ABNORMAL LOW (ref >60)
Glucose, Bld: 146 mg/dL — ABNORMAL HIGH (ref 70–99)
Potassium: 4.3 mmol/L (ref 3.5–5.1)
Sodium: 141 mmol/L (ref 135–145)
Total Bilirubin: 0.4 mg/dL (ref 0.3–1.2)
Total Protein: 8.4 g/dL — ABNORMAL HIGH (ref 6.5–8.1)

## 2021-03-10 LAB — CBC WITH DIFFERENTIAL (CANCER CENTER ONLY)
Abs Immature Granulocytes: 0.01 10*3/uL (ref 0.00–0.07)
Basophils Absolute: 0 10*3/uL (ref 0.0–0.1)
Basophils Relative: 1 %
Eosinophils Absolute: 2.4 10*3/uL — ABNORMAL HIGH (ref 0.0–0.5)
Eosinophils Relative: 31 %
HCT: 36.4 % (ref 36.0–46.0)
Hemoglobin: 11.7 g/dL — ABNORMAL LOW (ref 12.0–15.0)
Immature Granulocytes: 0 %
Lymphocytes Relative: 19 %
Lymphs Abs: 1.5 10*3/uL (ref 0.7–4.0)
MCH: 29.9 pg (ref 26.0–34.0)
MCHC: 32.1 g/dL (ref 30.0–36.0)
MCV: 93.1 fL (ref 80.0–100.0)
Monocytes Absolute: 0.5 10*3/uL (ref 0.1–1.0)
Monocytes Relative: 7 %
Neutro Abs: 3.3 10*3/uL (ref 1.7–7.7)
Neutrophils Relative %: 42 %
Platelet Count: 212 10*3/uL (ref 150–400)
RBC: 3.91 MIL/uL (ref 3.87–5.11)
RDW: 15.7 % — ABNORMAL HIGH (ref 11.5–15.5)
WBC Count: 7.7 10*3/uL (ref 4.0–10.5)
nRBC: 0 % (ref 0.0–0.2)

## 2021-03-10 NOTE — Patient Instructions (Signed)
Good to see you today!  I will call you with your blood work when I get it back over the weekend.  If your tumor marker is similar or decreased, then we will continue to watch it.  If it has continued to increase, then you and I will discuss the benefits of getting a CT scan.

## 2021-03-10 NOTE — Progress Notes (Signed)
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  CT imaging also revealed abdominal mass.  Subsequent biopsy confirmed diagnosis of ovarian cancer   05/21/2020 Procedure   Successful ultrasound guided diagnostic and therapeutic left thoracentesis yielding 580 cc of pleural fluid.   05/21/2020 Imaging   1. Large saddle pulmonary embolus with CT evidence of right heart strain (RV/LV ratio of 1.6) consistent with at least submassive (intermediate risk) PE. The presence of right heart strain has been associated with an increased risk of morbidity and mortality. 2. Large left and small right pleural effusions. 3. Large amount of peritoneal nodularity, consistent with peritoneal carcinomatosis. 4. Large midline pelvic mass, measuring up to 12.9 x 8.7 x 9.7 cm. 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   05/21/2020 Pathology Results   Clinical History: None provided  Specimen Submitted:  A. PLEURAL FLUID,LEFT, THORACENTESIS:    FINAL MICROSCOPIC DIAGNOSIS:  - Reactive mesothelial cells present   05/24/2020 Procedure   1. Successful placement of a right internal jugular approach power injectable Port-A-Cath. The catheter is ready for immediate use. 2. Successful ultrasound-guided paracentesis yielding 2.2 L of serous, slightly blood tinged ascitic fluid.   05/24/2020 Pathology Results   A. OMENTUM, NEEDLE CORE BIOPSY:  -  High-grade carcinoma  -  See comment   COMMENT:   By  immunohistochemistry, the neoplastic cells are positive for cytokeratin 7 and PAX 8 but negative for cytokeratin 20, CDX2 and TTF-1. 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    05/30/2020 Cancer Staging   Staging form: Ovary, Fallopian Tube, and Primary Peritoneal Carcinoma, AJCC 8th Edition - Clinical stage from 05/30/2020: FIGO Stage IIIC (cT3, cN0, cM0) - Signed by [name], MD on 05/30/2020    06/10/2020 Tumor Marker   Patient's tumor was tested for the following markers: CA-125. Results of the tumor marker test revealed 27,432.   06/14/2020 - 12/08/2020 Chemotherapy   The patient had carboplatin and taxol for chemotherapy treatment.     07/04/2020 Tumor Marker   Patient's tumor was tested for the following markers: CA-125 Results of the tumor marker test revealed 18334   08/29/2020 Imaging   CT chest:   1. No evidence of thoracic metastasis. 2. Resolution of proximal pulmonary emboli.   CT abdomen pelvis:   1. Marked reduction in volume of bulky pelvic mass. Nodularity remains along the uterine fundus. 2. Marked reduction in the nodular portions of the omental thickening. Cystic portions of the omental metastasis remain. 3. Decrease in volume of free fluid in the abdomen pelvis. 4. No new nodularity in the peritoneal space.     08/30/2020 Tumor Marker   Patient's tumor was tested for the following markers: CA-125 Results of the tumor marker test revealed 359   10/04/2020 Surgery   Operation: Exlap, radical tumor debulking including omentectomy, BSO, trachelectomy   Surgeon: [name]    Operative Findings: On EUA, cervix not definitively appreciated. Nodularity and somewhat fixed mass noted in the cul  de sac. On intra-abdominal entry, normal and smooth diaphragm, stomach, liver edge. Omentum densely adherent to the transverse colon with two 3-5cm cystic masses vs treated tumor within the  infracolic portion of the mass.  One of these masses was densely adherent to the anterior abdominal wall in the left upper abdomen.  Otherwise, omentum without evidence of disease. Small bowel normal in appearance. No adenopathy. No ascites. Bilateral tubes normal appearing. Ovaries in conglomerate mass with cervix. Mass densely adherent to the rectum posteriorly. Bladder adherent anteriorly to the cervix and superior to the cervix/ovarian mass conglomeration. After resection of the cervix and bilateral adnexa, the remaining nodules were able to be removed from the rectum.  Bubble test was negative for disruption of the rectum.  No palpable or visible tumor at the end of surgery (R0 resection)     10/04/2020 Pathology Results   SURGICAL PATHOLOGY  CASE: [id number]  PATIENT: Amber Gray  Surgical Pathology Report   FINAL MICROSCOPIC DIAGNOSIS:   A. 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   B. 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  - Tumor involves left ovary, right fallopian tube, uterine serosa, uterine wall (myometrium) and rectal implant.  - See oncology table.   ONCOLOGY TABLE:   OVARY or FALLOPIAN TUBE or PRIMARY PERITONEUM: Resection   Procedure: Total hysterectomy and bilateral salpingo-oophorectomy,  Excision of rectal implant  Specimen Integrity: Intact  Tumor Site: Left ovary  Tumor Size: Multifocal, largest focus measures 2.1 cm  Histologic Type: Serous carcinoma  Histologic Grade: High grade  Ovarian Surface Involvement: Present, left ovary  Fallopian Tube Surface Involvement: Present, right fallopian tube  Implants: Present, rectum  Other Tissue/ Organ Involvement: Uterine serosa, uterine wall  (myometrium)   Largest Extrapelvic Peritoneal Focus: 5 cm  Peritoneal/Ascitic Fluid Involvement: Not applicable  Chemotherapy Response Score (CRS): CRS2 (moderate response)  Regional Lymph Nodes: Not applicable (no regional lymph nodes submitted  or found)  Distant Metastasis:       Distant Site(s) Involved: Not applicable  Pathologic Stage Classification (pTNM, AJCC 8th Edition): ypT3c, pN not assigned  Additional Findings: Mild acute and chronic cervicitis.  Attenuated inactive endometrium with scattered cystically dilated glands.  Leiomyomata.    11/02/2020 Tumor Marker   Patient's tumor was tested for the following markers: CA-125 Results of the tumor marker test revealed 29.9.   12/09/2020 Tumor Marker   Patient's tumor was tested for the following markers: CA-125 Results of the tumor marker test revealed 22.1   12/12/2020 Genetic Testing   Negative genetic testing on the CancerNext panel.  The CancerNext gene panel offered by [employer] includes sequencing and rearrangement analysis for the following 34 genes:   APC, ATM, BARD1, BMPR1A, BRCA1, BRCA2, BRIP1, CDH1, CDK4, CDKN2A, CHEK2, DICER1, HOXB13, EPCAM, GREM1, MLH1, MRE11A, MSH2, MSH6, MUTYH, NBN, NF1, PALB2, PMS2, POLD1, POLE, PTEN, RAD50, RAD51C, RAD51D, SMAD4, SMARCA4, STK11, and TP53.    Negative genetic testing on the HRD tumor testing, suggesting that the patient's tumor will not be more sensitive to PARP inhibitors.  The report date is December 12, 2020.   01/12/2021 Tumor Marker   Patient's tumor was tested for the following markers: CA-125 Results of the tumor marker test revealed 25.7   01/13/2021 Imaging   1. Status post interval hysterectomy and bilateral oophorectomy. 2. Status post interval  omentectomy, with excision of previously noted cystic lesions along the ventral left hemiabdomen. 3. There is persistent nodularity and peritoneal thickening in the left upper quadrant, consistent with unchanged peritoneal metastatic  disease. 4. No evidence of lymphadenopathy or new metastatic disease in the abdomen or pelvis on this postoperative baseline 5. Moderate hiatal hernia with intrathoracic position of the gastric fundus.   Aortic Atherosclerosis (ICD10-I70.0).   02/24/2021 Tumor Marker   Patient's tumor was tested for the following markers: CA-125 Results of the tumor marker test revealed 46.7     Interval History: The patient presents today after finishing adjuvant chemotherapy in March.  She recovered well from surgery and has been doing well since end of chemotherapy.  She most recently came in for blood work at the end of May and at that time her Ca1 25 had increased from 61 to almost 46.  The patient describes having what sounds like an allergic reaction with face swelling and splotches all over her body which she thinks was due to a change in detergent around the time of her recent blood work.  She reports improvement in her appetite, denies any nausea or emesis.  She reports intermittent diarrhea and constipation but overall states that she has normal bowel function.  She denies any vaginal bleeding or discharge.  She has some urinary urgency, otherwise denies urinary symptoms.  She occasionally has some right-sided lower pelvic pain when she is active, otherwise denies significant abdominal or pelvic pain.  Past Medical/Surgical History: Past Medical History:  Diagnosis Date   Anxiety    Arthritis    "all over my body" (05/27/2017)   Chronic lower back pain    Depression    Dyspnea    allergy related SOB   Frequent UTI    GERD (gastroesophageal reflux disease)    History of blood transfusion    "when I had my neck fusion"   History of hiatal hernia    Hypercholesteremia    Hypertension    Hypothyroid    Pneumonia 2000s X 1; 05/27/2017   PONV (postoperative nausea and vomiting)    Seasonal allergies    Type II diabetes mellitus ([place])     Past Surgical History:  Procedure Laterality Date    ANKLE ARTHROSCOPY Right 03/29/2020   Procedure: RIGHT ANKLE ARTHROSCOPY AND DEBRIDEMENT;  Surgeon: Newt Minion, MD;  Location: [facility];  Service: Orthopedics;  Laterality: Right;   BACK SURGERY     BREAST BIOPSY Left    CATARACT EXTRACTION W/ INTRAOCULAR LENS  IMPLANT, BILATERAL Bilateral    COLONOSCOPY  10/25/2005   normal   DEBULKING N/A 10/04/2020   Procedure: TUMOR DEBULKING;  Surgeon: Lafonda Mosses, MD;  Location: [facility];  Service: Gynecology;  Laterality: N/A;   DILATION AND CURETTAGE OF UTERUS     IR IMAGING GUIDED PORT INSERTION  05/24/2020   IR PARACENTESIS  05/24/2020   OMENTECTOMY N/A 10/04/2020   Procedure: OMENTECTOMY;  Surgeon: Lafonda Mosses, MD;  Location: [facility];  Service: Gynecology;  Laterality: N/A;   PARTIAL HYSTERECTOMY  1968   POSTERIOR CERVICAL FUSION/FORAMINOTOMY     "fused 4,5,6"   SALPINGOOPHORECTOMY Bilateral 10/04/2020   Procedure: RACICAL OVARIAN CANCER DEBULKING INCLUDING BILATERAL  SALPINGO OOPHORECTOMY AND TRACHELECTOMY;  Surgeon: Lafonda Mosses, MD;  Location: [facility];  Service: Gynecology;  Laterality: Bilateral;   TONSILLECTOMY      Family History  Problem Relation Age of Onset   Alzheimer's disease Mother    Heart  attack Father    Heart disease Father    Dwarfism Sister    Colon cancer Neg Hx    Colon polyps Neg Hx    Esophageal cancer Neg Hx    Rectal cancer Neg Hx    Stomach cancer Neg Hx     Social History   Socioeconomic History   Marital status: Divorced    Spouse name: Not on file   Number of children: Not on file   Years of education: Not on file   Highest education level: Not on file  Occupational History   Not on file  Tobacco Use   Smoking status: Former    Packs/day: 1.00    Years: 6.00    Pack years: 6.00    Types: Cigarettes    Quit date: 10/01/1964    Years since quitting: 56.4   Smokeless tobacco: Never  Vaping Use   Vaping Use: Never used  Substance and Sexual Activity   Alcohol  use: No   Drug use: No   Sexual activity: Not Currently  Other Topics Concern   Not on file  Social History Narrative   Not on file   Social Determinants of Health   Financial Resource Strain: Not on file  Food Insecurity: Not on file  Transportation Needs: Not on file  Physical Activity: Not on file  Stress: Not on file  Social Connections: Not on file    Current Medications:  Current Outpatient Medications:    acetaminophen (TYLENOL) 325 MG tablet, Take 650 mg by mouth every 6 (six) hours as needed for mild pain., Disp: , Rfl:    apixaban (ELIQUIS) 2.5 MG TABS tablet, Take 2.5 mg by mouth 2 (two) times daily., Disp: , Rfl:    empagliflozin (JARDIANCE) 10 MG TABS tablet, Take 10 mg by mouth every other day., Disp: , Rfl:    furosemide (LASIX) 40 MG tablet, Take 40 mg by mouth daily. , Disp: , Rfl: 11   levothyroxine (SYNTHROID) 88 MCG tablet, Take 88 mcg by mouth daily before breakfast. , Disp: , Rfl:    lidocaine-prilocaine (EMLA) cream, Apply 1 application topically daily as needed. (Patient not taking: No sig reported), Disp: 30 g, Rfl: 11   LINZESS 72 MCG capsule, Take 72 mcg by mouth daily as needed (Constipation)., Disp: , Rfl:    melatonin 5 MG TABS, Take 5 mg by mouth at bedtime as needed (Sleep)., Disp: , Rfl:    omeprazole (PRILOSEC) 20 MG capsule, Take 20 mg by mouth daily. (Patient not taking: No sig reported), Disp: , Rfl:    senna-docusate (SENOKOT-S) 8.6-50 MG tablet, Take 2 tablets by mouth at bedtime. For AFTER surgery, do not take if having diarrhea, Disp: 30 tablet, Rfl: 0   sertraline (ZOLOFT) 50 MG tablet, Take 50 mg by mouth daily., Disp: , Rfl: 0   simvastatin (ZOCOR) 40 MG tablet, Take 40 mg by mouth daily., Disp: , Rfl: 3   TRADJENTA 5 MG TABS tablet, Take 5 mg by mouth daily after supper., Disp: , Rfl: 3  Review of Systems: Pertinent positives include fatigue, voice changes, cough, shortness of breath, leg swelling, palpitations, constipation, diarrhea,  joint pain, pruritus, rash, dizziness, headache, bruising/bleeding easily, anxiety, depression, confusion, decreased concentration. Denies appetite changes, fevers, chills, unexplained weight changes. Denies hearing loss, neck lumps or masses, mouth sores, ringing in ears. Denies wheezing.  Denies chest pain.  Denies abdominal distention, pain, blood in stools, nausea, vomiting, or early satiety. Denies pain with intercourse, dysuria, frequency,  hematuria or incontinence. Denies hot flashes, pelvic pain, vaginal bleeding or vaginal discharge.   Denies back pain or muscle pain/cramps. Denies rash, or wounds. Denies numbness or seizures. Denies swollen lymph nodes or glands.  Physical Exam: BP 129/67 (BP Location: Left Arm, Patient Position: Sitting)   Pulse 60   Temp (!) 97.4 F (36.3 C) (Tympanic)   Resp 16   Ht 5' (1.524 m)   Wt 101 lb 6.4 oz (46 kg)   SpO2 100%   BMI 19.80 kg/m  General: Alert, oriented, no acute distress. HEENT: Normocephalic, atraumatic, sclera anicteric. Chest: Clear to auscultation bilaterally.  Port site clean, bandage in place over the port. Abdomen: soft, nontender.  Normoactive bowel sounds.  Mildly distended, nontympanic.  No masses or hepatosplenomegaly appreciated.  Well-healed scar. Extremities: Grossly normal range of motion.  Warm, well perfused.  No edema bilaterally. Lymphatics: No cervical, supraclavicular, or inguinal adenopathy. GU: Normal appearing external genitalia without erythema, excoriation, or lesions.  Bimanual exam reveals no masses or nodularity, cuff intact.  Rectovaginal exam confirms findings.  Laboratory & Radiologic Studies: CBC    Component Value Date/Time   WBC 7.7 03/10/2021 1420   WBC 6.6 10/07/2020 0625   RBC 3.91 03/10/2021 1420   HGB 11.7 (L) 03/10/2021 1420   HCT 36.4 03/10/2021 1420   PLT 212 03/10/2021 1420   MCV 93.1 03/10/2021 1420   MCH 29.9 03/10/2021 1420   MCHC 32.1 03/10/2021 1420   RDW 15.7 (H)  03/10/2021 1420   LYMPHSABS 1.5 03/10/2021 1420   MONOABS 0.5 03/10/2021 1420   EOSABS 2.4 (H) 03/10/2021 1420   BASOSABS 0.0 03/10/2021 1420   CMP Latest Ref Rng & Units 03/10/2021 02/24/2021 01/12/2021  Glucose 70 - 99 mg/dL 146(H) 101(H) 116(H)  BUN 8 - 23 mg/dL 29(H) 27(H) 24(H)  Creatinine 0.44 - 1.00 mg/dL 1.20(H) 0.89 1.01(H)  Sodium 135 - 145 mmol/L 141 141 143  Potassium 3.5 - 5.1 mmol/L 4.3 4.3 3.3(L)  Chloride 98 - 111 mmol/L 106 106 101  CO2 22 - 32 mmol/L $RemoveB'26 25 29  'CroSbBhK$ Calcium 8.9 - 10.3 mg/dL 9.4 8.9 9.5  Total Protein 6.5 - 8.1 g/dL 8.4(H) 7.2 8.0  Total Bilirubin 0.3 - 1.2 mg/dL 0.4 0.3 0.4  Alkaline Phos 38 - 126 U/L 87 72 83  AST 15 - 41 U/L $Remo'21 19 23  'vXnCv$ ALT 0 - 44 U/L $Remo'16 12 22   'mQNjA$ Assessment & Plan: Amber Gray is a 80 y.o. woman with advanced stage high-grade serous ovarian cancer who finished adjuvant treatment in March now presenting with mild elevation of her CA-125.  Overall the patient has recovered well from treatment and is relatively asymptomatic.  We discussed that CA-125 may signal progressive disease.  Given her leukocytosis at the time of her labs recently, I want to make sure that this was not falsely elevated in the setting of acute inflammation.  She describes symptoms that are consistent with a systemic allergic reaction, possibly to detergent.  CBC today shows normalization of her white blood cell count.  A CA125 will be repeated.  I will call her over the weekend to discuss.  If CA125 continues to increase, then we will discuss the benefits of obtaining a CT scan.  32 minutes of total time was spent for this patient encounter, including preparation, face-to-face counseling with the patient and coordination of care, and documentation of the encounter.  Jeral Pinch, MD  Division of Gynecologic Oncology  Department of Obstetrics and Gynecology  [facility]  [facility]

## 2021-03-11 ENCOUNTER — Telehealth: Payer: Self-pay | Admitting: Gynecologic Oncology

## 2021-03-11 LAB — CA 125: Cancer Antigen (CA) 125: 82.9 U/mL — ABNORMAL HIGH (ref 0.0–38.1)

## 2021-03-11 NOTE — Telephone Encounter (Signed)
Called the patient to discuss CA-125 results. No answer, unable to leave VM. Called daughter. Left VM. Will plan to try them again tomorrow.  [name] Gynecologic Oncology

## 2021-03-13 ENCOUNTER — Other Ambulatory Visit: Payer: Self-pay | Admitting: Gynecologic Oncology

## 2021-03-13 ENCOUNTER — Telehealth: Payer: Self-pay | Admitting: *Deleted

## 2021-03-13 DIAGNOSIS — C569 Malignant neoplasm of unspecified ovary: Secondary | ICD-10-CM

## 2021-03-13 NOTE — Progress Notes (Signed)
I called patient's daughter and discussed CA-125 results. Discussed options in the setting of platinum-resistant recurrent disease. We discussed surveillance given relatively asymptomatic versus CT scan now to assess disease burden. The patient has voiced to her daughter that she does not want any further chemotherapy. The daughter would like to move forward with a CT scan but is very much supportive of her mother's desire not to undergo further treatment. We reviewed that this can still mean support through symptom control to maintain QOL.  Will order CT and have office call to schedule.  [name] Gynecologic Oncology

## 2021-03-13 NOTE — Telephone Encounter (Signed)
Per Dr Berline Lopes scheduled a CT C/A/P for this Friday 6/17 per patient family request. Called and spoke with the patient's daughter, Blanch Media and gave the appt date/time. Explained to come to the cancer center any day this week before Friday and go to scheduling for the contrast and instructions

## 2021-03-17 ENCOUNTER — Other Ambulatory Visit: Payer: Self-pay

## 2021-03-17 ENCOUNTER — Ambulatory Visit (HOSPITAL_COMMUNITY)
Admission: RE | Admit: 2021-03-17 | Discharge: 2021-03-17 | Disposition: A | Discharge status: Home / Self Care | Payer: Medicare Other | Source: Ambulatory Visit | Attending: Gynecologic Oncology | Admitting: Gynecologic Oncology

## 2021-03-17 DIAGNOSIS — C569 Malignant neoplasm of unspecified ovary: Secondary | ICD-10-CM | POA: Diagnosis not present

## 2021-03-17 DIAGNOSIS — I700 Atherosclerosis of aorta: Secondary | ICD-10-CM | POA: Insufficient documentation

## 2021-03-17 DIAGNOSIS — K449 Diaphragmatic hernia without obstruction or gangrene: Secondary | ICD-10-CM | POA: Insufficient documentation

## 2021-03-17 DIAGNOSIS — R918 Other nonspecific abnormal finding of lung field: Secondary | ICD-10-CM | POA: Insufficient documentation

## 2021-03-17 MED ORDER — SODIUM CHLORIDE (PF) 0.9 % IJ SOLN
INTRAMUSCULAR | Status: AC
  Filled 2021-03-17: qty 50

## 2021-03-17 MED ORDER — IOHEXOL 300 MG/ML  SOLN
75.0000 mL | Freq: Once | INTRAMUSCULAR | Status: AC | PRN
  Administered 2021-03-17: 75 mL via INTRAVENOUS

## 2021-03-20 ENCOUNTER — Telehealth: Payer: Self-pay | Admitting: Gynecologic Oncology

## 2021-03-20 NOTE — Telephone Encounter (Signed)
I called Amber Gray, patient's daughter, and discussed CT findings with show low volume metastatic disease (discussed new lung findings, omental and pelvic findings). Amber Gray has voiced previously that she did not want additional chemotherapy, but Amber Gray is going to speak with her in light of CT regarding whether she would like to come in for visit with Dr. Alvy Bimler to discuss possible treatment options for platinum-resistant ovarian cancer. We also discussed the support provided and focus on quality of life is Amber Gray does not want to pursue additional cancer-directed therapy. Amber Gray will call my office after she speaks with her mother to let us know how they'd like to proceed.  [name] Gynecologic Oncology

## 2021-03-23 ENCOUNTER — Telehealth: Payer: Self-pay | Admitting: *Deleted

## 2021-03-23 NOTE — Telephone Encounter (Signed)
Patient's daughter called and left a message stating "mom is interested in meeting with Dr Alvy Bimler about treatment."  Called the daughter back and explained that the message was given to Dr Berline Lopes and Dr Alvy Bimler.

## 2021-03-24 ENCOUNTER — Encounter: Payer: Self-pay | Admitting: Hematology and Oncology

## 2021-03-24 ENCOUNTER — Telehealth: Payer: Self-pay | Admitting: *Deleted

## 2021-03-24 NOTE — Telephone Encounter (Signed)
Per Dr Alvy Bimler scheduled the patient for a new patient on 7/1 at 2:20 pm. Called that patient's daughter and gave the new patient appt with new port flush appt before

## 2021-03-31 ENCOUNTER — Encounter: Payer: Self-pay | Admitting: Hematology and Oncology

## 2021-03-31 ENCOUNTER — Telehealth: Payer: Self-pay

## 2021-03-31 ENCOUNTER — Inpatient Hospital Stay (HOSPITAL_BASED_OUTPATIENT_CLINIC_OR_DEPARTMENT_OTHER): Payer: Medicare Other | Admitting: Hematology and Oncology

## 2021-03-31 ENCOUNTER — Inpatient Hospital Stay: Payer: Medicare Other | Admitting: Hematology and Oncology

## 2021-03-31 ENCOUNTER — Other Ambulatory Visit: Payer: Self-pay

## 2021-03-31 ENCOUNTER — Inpatient Hospital Stay: Payer: Medicare Other | Attending: Hematology and Oncology

## 2021-03-31 DIAGNOSIS — C562 Malignant neoplasm of left ovary: Secondary | ICD-10-CM

## 2021-03-31 DIAGNOSIS — Z7189 Other specified counseling: Secondary | ICD-10-CM | POA: Diagnosis not present

## 2021-03-31 DIAGNOSIS — Z66 Do not resuscitate: Secondary | ICD-10-CM | POA: Insufficient documentation

## 2021-03-31 DIAGNOSIS — R918 Other nonspecific abnormal finding of lung field: Secondary | ICD-10-CM | POA: Insufficient documentation

## 2021-03-31 DIAGNOSIS — C569 Malignant neoplasm of unspecified ovary: Secondary | ICD-10-CM

## 2021-03-31 LAB — CBC WITH DIFFERENTIAL (CANCER CENTER ONLY)
Abs Immature Granulocytes: 0.01 10*3/uL (ref 0.00–0.07)
Basophils Absolute: 0 10*3/uL (ref 0.0–0.1)
Basophils Relative: 1 %
Eosinophils Absolute: 0.3 10*3/uL (ref 0.0–0.5)
Eosinophils Relative: 5 %
HCT: 35.6 % — ABNORMAL LOW (ref 36.0–46.0)
Hemoglobin: 11.5 g/dL — ABNORMAL LOW (ref 12.0–15.0)
Immature Granulocytes: 0 %
Lymphocytes Relative: 26 %
Lymphs Abs: 1.6 10*3/uL (ref 0.7–4.0)
MCH: 29.7 pg (ref 26.0–34.0)
MCHC: 32.3 g/dL (ref 30.0–36.0)
MCV: 92 fL (ref 80.0–100.0)
Monocytes Absolute: 0.6 10*3/uL (ref 0.1–1.0)
Monocytes Relative: 9 %
Neutro Abs: 3.7 10*3/uL (ref 1.7–7.7)
Neutrophils Relative %: 59 %
Platelet Count: 196 10*3/uL (ref 150–400)
RBC: 3.87 MIL/uL (ref 3.87–5.11)
RDW: 14 % (ref 11.5–15.5)
WBC Count: 6.1 10*3/uL (ref 4.0–10.5)
nRBC: 0 % (ref 0.0–0.2)

## 2021-03-31 LAB — CMP (CANCER CENTER ONLY)
ALT: 21 U/L (ref 0–44)
AST: 21 U/L (ref 15–41)
Albumin: 3.8 g/dL (ref 3.5–5.0)
Alkaline Phosphatase: 89 U/L (ref 38–126)
Anion gap: 11 (ref 5–15)
BUN: 32 mg/dL — ABNORMAL HIGH (ref 8–23)
CO2: 24 mmol/L (ref 22–32)
Calcium: 9 mg/dL (ref 8.9–10.3)
Chloride: 107 mmol/L (ref 98–111)
Creatinine: 1.25 mg/dL — ABNORMAL HIGH (ref 0.44–1.00)
GFR, Estimated: 44 mL/min — ABNORMAL LOW (ref >60)
Glucose, Bld: 130 mg/dL — ABNORMAL HIGH (ref 70–99)
Potassium: 3.8 mmol/L (ref 3.5–5.1)
Sodium: 142 mmol/L (ref 135–145)
Total Bilirubin: 0.3 mg/dL (ref 0.3–1.2)
Total Protein: 8.2 g/dL — ABNORMAL HIGH (ref 6.5–8.1)

## 2021-03-31 MED ORDER — SODIUM CHLORIDE 0.9% FLUSH
10.0000 mL | Freq: Once | INTRAVENOUS | Status: AC
  Administered 2021-03-31: 10 mL
  Filled 2021-03-31: qty 10

## 2021-03-31 MED ORDER — HEPARIN SOD (PORK) LOCK FLUSH 100 UNIT/ML IV SOLN
500.0000 [IU] | Freq: Once | INTRAVENOUS | Status: AC
  Administered 2021-03-31: 500 [IU]
  Filled 2021-03-31: qty 5

## 2021-03-31 NOTE — Telephone Encounter (Signed)
-----   Message from [name], MD sent at 03/31/2021  2:49 PM EDT ----- Pls set up palliative care with hospice service for her [facility]

## 2021-03-31 NOTE — Patient Instructions (Signed)
Implanted Port Home Guide An implanted port is a device that is placed under the skin. It is usually placed in the chest. The device can be used to give IV medicine, to take blood, or for dialysis. You may have an implanted port if: You need IV medicine that would be irritating to the small veins in your hands or arms. You need IV medicines, such as antibiotics, for a long period of time. You need IV nutrition for a long period of time. You need dialysis. When you have a port, your health care provider can choose to use the port instead of veins in your arms for these procedures. You may have fewer limitations when using a port than you would if you used other types of long-term IVs, and you will likely be able to return to normal activities afteryour incision heals. An implanted port has two main parts: Reservoir. The reservoir is the part where a needle is inserted to give medicines or draw blood. The reservoir is round. After it is placed, it appears as a small, raised area under your skin. Catheter. The catheter is a thin, flexible tube that connects the reservoir to a vein. Medicine that is inserted into the reservoir goes into the catheter and then into the vein. How is my port accessed? To access your port: A numbing cream may be placed on the skin over the port site. Your health care provider will put on a mask and sterile gloves. The skin over your port will be cleaned carefully with a germ-killing soap and allowed to dry. Your health care provider will gently pinch the port and insert a needle into it. Your health care provider will check for a blood return to make sure the port is in the vein and is not clogged. If your port needs to remain accessed to get medicine continuously (constant infusion), your health care provider will place a clear bandage (dressing) over the needle site. The dressing and needle will need to be changed every week, or as told by your health care provider. What  is flushing? Flushing helps keep the port from getting clogged. Follow instructions from your health care provider about how and when to flush the port. Ports are usually flushed with saline solution or a medicine called heparin. The need for flushing will depend on how the port is used: If the port is only used from time to time to give medicines or draw blood, the port may need to be flushed: Before and after medicines have been given. Before and after blood has been drawn. As part of routine maintenance. Flushing may be recommended every 4-6 weeks. If a constant infusion is running, the port may not need to be flushed. Throw away any syringes in a disposal container that is meant for sharp items (sharps container). You can buy a sharps container from a pharmacy, or you can make one by using an empty hard plastic bottle with a cover. How long will my port stay implanted? The port can stay in for as long as your health care provider thinks it is needed. When it is time for the port to come out, a surgery will be done to remove it. The surgery will be similar to the procedure that was done to putthe port in. Follow these instructions at home:  Flush your port as told by your health care provider. If you need an infusion over several days, follow instructions from your health care provider about how to take   care of your port site. Make sure you: Wash your hands with soap and water before you change your dressing. If soap and water are not available, use alcohol-based hand sanitizer. Change your dressing as told by your health care provider. Place any used dressings or infusion bags into a plastic bag. Throw that bag in the trash. Keep the dressing that covers the needle clean and dry. Do not get it wet. Do not use scissors or sharp objects near the tube. Keep the tube clamped, unless it is being used. Check your port site every day for signs of infection. Check for: Redness, swelling, or  pain. Fluid or blood. Pus or a bad smell. Protect the skin around the port site. Avoid wearing bra straps that rub or irritate the site. Protect the skin around your port from seat belts. Place a soft pad over your chest if needed. Bathe or shower as told by your health care provider. The site may get wet as long as you are not actively receiving an infusion. Return to your normal activities as told by your health care provider. Ask your health care provider what activities are safe for you. Carry a medical alert card or wear a medical alert bracelet at all times. This will let health care providers know that you have an implanted port in case of an emergency. Get help right away if: You have redness, swelling, or pain at the port site. You have fluid or blood coming from your port site. You have pus or a bad smell coming from the port site. You have a fever. Summary Implanted ports are usually placed in the chest for long-term IV access. Follow instructions from your health care provider about flushing the port and changing bandages (dressings). Take care of the area around your port by avoiding clothing that puts pressure on the area, and by watching for signs of infection. Protect the skin around your port from seat belts. Place a soft pad over your chest if needed. Get help right away if you have a fever or you have redness, swelling, pain, drainage, or a bad smell at the port site. This information is not intended to replace advice given to you by your health care provider. Make sure you discuss any questions you have with your healthcare provider. Document Revised: 02/01/2020 Document Reviewed: 02/01/2020 Elsevier Patient Education  2022 [employer] Inc[employer]  

## 2021-03-31 NOTE — Progress Notes (Signed)
[facility] OFFICE PROGRESS NOTE  Patient Care Team: [name], MD as PCP - General (Internal Medicine)  ASSESSMENT & PLAN:  Ovarian CA, left [facility]) I have reviewed her blood work and imaging studies Unfortunately, the patient have rapid relapse of disease In comparison with her CT scan from mid April, within 2 months, she had rapid progression of disease She also have cluster of abnormal lung nodules, suspicious of metastatic disease to the lung She is frail She is aware that any form of treatment moving forward is palliative in nature Expected benefit from palliative chemotherapy in the setting of carboplatin refractory disease is 20% or less After long discussion, she is in agreement with palliative care and hospice service We will set up the referral and will inform her primary care physician about this decision  Goals of care, counseling/discussion The patient has rapid disease relapse She is platinum refractory She is aware of poor prognosis and understood why surgery and radiation therapy are not indicated She has made informed decision not to pursue further chemotherapy She is in agreement with palliative care and hospice service; we will set up referral We discussed CODE STATUS and she agreed for DNR I gave her a DNR order We discussed prognosis She would likely succumb to her disease in under 6 months  No orders of the defined types were placed in this encounter.   All questions were answered. The patient knows to call the clinic with any problems, questions or concerns. The total time spent in the appointment was 40 minutes encounter with patients including review of chart and various tests results, discussions about plan of care and coordination of care plan   Amber Lark, MD 03/31/2021 2:47 PM  INTERVAL HISTORY: Please see below for problem oriented charting. 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  CT imaging also revealed abdominal mass.  Subsequent biopsy confirmed diagnosis of ovarian cancer   05/21/2020 Procedure   Successful ultrasound guided diagnostic and therapeutic left thoracentesis yielding 580 cc of pleural fluid.   05/21/2020 Imaging   1. Large saddle pulmonary embolus with CT evidence of right heart strain (RV/LV ratio of 1.6) consistent with at least submassive (intermediate risk) PE. The presence of right heart strain has been associated with an increased risk of morbidity and mortality. 2. Large left and small right pleural effusions. 3. Large amount of peritoneal nodularity, consistent with peritoneal carcinomatosis. 4. Large midline pelvic mass, measuring up to 12.9 x 8.7 x 9.7 cm. 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   05/21/2020 Pathology Results   Clinical History: None provided  Specimen Submitted:  A. PLEURAL FLUID,LEFT, THORACENTESIS:    FINAL MICROSCOPIC DIAGNOSIS:  - Reactive mesothelial cells present   05/24/2020 Procedure   1. Successful placement of a right internal jugular approach power injectable Port-A-Cath. The catheter is ready  for immediate use. 2. Successful ultrasound-guided paracentesis yielding 2.2 L of serous, slightly blood tinged ascitic fluid.   05/24/2020 Pathology Results   A. OMENTUM, NEEDLE CORE  BIOPSY:  -  High-grade carcinoma  -  See comment   COMMENT:   By immunohistochemistry, the neoplastic cells are positive for cytokeratin 7 and PAX 8 but negative for cytokeratin 20, CDX2 and TTF-1. 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    05/30/2020 Cancer Staging   Staging form: Ovary, Fallopian Tube, and Primary Peritoneal Carcinoma, AJCC 8th Edition - Clinical stage from 05/30/2020: FIGO Stage IIIC (cT3, cN0, cM0) - Signed by Amber Lark, MD on 05/30/2020    06/10/2020 Tumor Marker   Patient's tumor was tested for the following markers: CA-125. Results of the tumor marker test revealed 27,432.   06/14/2020 - 12/08/2020 Chemotherapy   The patient had carboplatin and taxol for chemotherapy treatment.     07/04/2020 Tumor Marker   Patient's tumor was tested for the following markers: CA-125 Results of the tumor marker test revealed [id number]   08/29/2020 Imaging   CT chest:   1. No evidence of thoracic metastasis. 2. Resolution of proximal pulmonary emboli.   CT abdomen pelvis:   1. Marked reduction in volume of bulky pelvic mass. Nodularity remains along the uterine fundus. 2. Marked reduction in the nodular portions of the omental thickening. Cystic portions of the omental metastasis remain. 3. Decrease in volume of free fluid in the abdomen pelvis. 4. No new nodularity in the peritoneal space.     08/30/2020 Tumor Marker   Patient's tumor was tested for the following markers: CA-125 Results of the tumor marker test revealed 359   10/04/2020 Surgery   Operation: Exlap, radical tumor debulking including omentectomy, BSO, trachelectomy   Surgeon: [name]    Operative Findings: On EUA, cervix not definitively appreciated. Nodularity and somewhat fixed mass noted in the cul de sac. On intra-abdominal entry, normal and smooth diaphragm, stomach, liver edge. Omentum densely adherent to the  transverse colon with two 3-5cm cystic masses vs treated tumor within the infracolic portion of the mass.  One of these masses was densely adherent to the anterior abdominal wall in the left upper abdomen.  Otherwise, omentum without evidence of disease. Small bowel normal in appearance. No adenopathy. No ascites. Bilateral tubes normal appearing. Ovaries in conglomerate mass with cervix. Mass densely adherent to the rectum posteriorly. Bladder adherent anteriorly to the cervix and superior to the cervix/ovarian mass conglomeration. After resection of the cervix and bilateral adnexa, the remaining nodules were able to be removed from the rectum.  Bubble test was negative for disruption of the rectum.  No palpable or visible tumor at the end of surgery (R0 resection)     10/04/2020 Pathology Results   SURGICAL PATHOLOGY  CASE: [id number]  PATIENT: [facility]  Surgical Pathology Report   FINAL MICROSCOPIC DIAGNOSIS:   A. 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   B. 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  - Tumor involves left ovary, right fallopian tube, uterine serosa, uterine wall (myometrium) and rectal implant.  - See oncology table.   ONCOLOGY TABLE:   OVARY or FALLOPIAN TUBE or PRIMARY PERITONEUM: Resection   Procedure: Total hysterectomy and bilateral salpingo-oophorectomy,  Excision of rectal implant  Specimen Integrity: Intact  Tumor Site: Left ovary  Tumor Size: Multifocal, largest focus measures 2.1 cm  Histologic Type: Serous carcinoma  Histologic Grade: High grade  Ovarian Surface Involvement: Present, left ovary  Fallopian Tube Surface Involvement: Present, right fallopian tube  Implants: Present, rectum  Other  Tissue/ Organ Involvement: Uterine serosa, uterine wall  (myometrium)  Largest Extrapelvic Peritoneal Focus: 5 cm  Peritoneal/Ascitic Fluid Involvement: Not applicable  Chemotherapy Response Score (CRS): CRS2 (moderate response)  Regional Lymph Nodes: Not applicable (no regional lymph nodes submitted  or found)  Distant Metastasis:       Distant Site(s) Involved: Not applicable  Pathologic Stage Classification (pTNM, AJCC 8th Edition): ypT3c, pN not assigned  Additional Findings: Mild acute and chronic cervicitis.  Attenuated inactive endometrium with scattered cystically dilated glands.  Leiomyomata.    11/02/2020 Tumor Marker   Patient's tumor was tested for the following markers: CA-125 Results of the tumor marker test revealed 29.9.   12/09/2020 Tumor Marker   Patient's tumor was tested for the following markers: CA-125 Results of the tumor marker test revealed 22.1   12/12/2020 Genetic Testing   Negative genetic testing on the CancerNext panel.  The CancerNext gene panel offered by [employer] includes sequencing and rearrangement analysis for the following 34 genes:   APC, ATM, BARD1, BMPR1A, BRCA1, BRCA2, BRIP1, CDH1, CDK4, CDKN2A, CHEK2, DICER1, HOXB13, EPCAM, GREM1, MLH1, MRE11A, MSH2, MSH6, MUTYH, NBN, NF1, PALB2, PMS2, POLD1, POLE, PTEN, RAD50, RAD51C, RAD51D, SMAD4, SMARCA4, STK11, and TP53.    Negative genetic testing on the HRD tumor testing, suggesting that the patient's tumor will not be more sensitive to PARP inhibitors.  The report date is December 12, 2020.   01/12/2021 Tumor Marker   Patient's tumor was tested for the following markers: CA-125 Results of the tumor marker test revealed 25.7   01/13/2021 Imaging   1. Status post interval hysterectomy and bilateral oophorectomy. 2. Status post interval omentectomy, with excision of previously noted cystic lesions along the ventral left hemiabdomen. 3. There is persistent nodularity and peritoneal thickening in the left  upper quadrant, consistent with unchanged peritoneal metastatic disease. 4. No evidence of lymphadenopathy or new metastatic disease in the abdomen or pelvis on this postoperative baseline 5. Moderate hiatal hernia with intrathoracic position of the gastric fundus.   Aortic Atherosclerosis (ICD10-I70.0).   02/24/2021 Tumor Marker   Patient's tumor was tested for the following markers: CA-125 Results of the tumor marker test revealed 46.7   03/10/2021 Tumor Marker   Patient's tumor was tested for the following markers: CA-125. Results of the tumor marker test revealed 82.9.   03/17/2021 Imaging   1. Cluster of pulmonary nodules in the superior segment of the right lower lobe, new since the prior CT and concerning for metastatic disease in this patient with rising tumor markers. 2. Interval increase in the size and number of the omental nodules in the left upper abdomen compared to the prior CT. 3. Several new and enlarging left pelvic nodules as described. 4. Moderate size hiatal hernia. 5. Aortic Atherosclerosis (ICD10-I70.0).       REVIEW OF SYSTEMS:   Constitutional: Denies fevers, chills or abnormal weight loss Eyes: Denies blurriness of vision Ears, nose, mouth, throat, and face: Denies mucositis or sore throat Respiratory: Denies cough, dyspnea or wheezes Cardiovascular: Denies palpitation, chest discomfort or lower extremity swelling Skin: Denies abnormal skin rashes Lymphatics: Denies new lymphadenopathy or easy bruising Neurological:Denies numbness, tingling or new weaknesses Behavioral/Psych: Mood  is stable, no new changes  All other systems were reviewed with the patient and are negative.  I have reviewed the past medical history, past surgical history, social history and family history with the patient and they are unchanged from previous note.  ALLERGIES:  is allergic to metformin and related.  MEDICATIONS:  Current Outpatient Medications  Medication Sig Dispense  Refill   acetaminophen (TYLENOL) 325 MG tablet Take 650 mg by mouth every 6 (six) hours as needed for mild pain.     apixaban (ELIQUIS) 2.5 MG TABS tablet Take 2.5 mg by mouth 2 (two) times daily.     empagliflozin (JARDIANCE) 10 MG TABS tablet Take 10 mg by mouth every other day.     furosemide (LASIX) 40 MG tablet Take 40 mg by mouth daily.   11   levothyroxine (SYNTHROID) 88 MCG tablet Take 88 mcg by mouth daily before breakfast.      lidocaine-prilocaine (EMLA) cream Apply 1 application topically daily as needed. (Patient not taking: No sig reported) 30 g 11   LINZESS 72 MCG capsule Take 72 mcg by mouth daily as needed (Constipation).     melatonin 5 MG TABS Take 5 mg by mouth at bedtime as needed (Sleep).     omeprazole (PRILOSEC) 20 MG capsule Take 20 mg by mouth daily. (Patient not taking: No sig reported)     senna-docusate (SENOKOT-S) 8.6-50 MG tablet Take 2 tablets by mouth at bedtime. For AFTER surgery, do not take if having diarrhea 30 tablet 0   sertraline (ZOLOFT) 50 MG tablet Take 50 mg by mouth daily.  0   simvastatin (ZOCOR) 40 MG tablet Take 40 mg by mouth daily.  3   TRADJENTA 5 MG TABS tablet Take 5 mg by mouth daily after supper.  3   No current facility-administered medications for this visit.    PHYSICAL EXAMINATION: ECOG PERFORMANCE STATUS: 1 - Symptomatic but completely ambulatory  Vitals:   03/31/21 1418  BP: (!) 138/58  Pulse: 62  Resp: 18  Temp: (!) 97.4 F (36.3 C)  SpO2: 100%   Filed Weights   03/31/21 1418  Weight: 105 lb 12.8 oz (48 kg)    GENERAL:alert, no distress and comfortable NEURO: alert & oriented x 3 with fluent speech, no focal motor/sensory deficits  LABORATORY DATA:  I have reviewed the data as listed    Component Value Date/Time   NA 142 03/31/2021 1404   K 3.8 03/31/2021 1404   CL 107 03/31/2021 1404   CO2 24 03/31/2021 1404   GLUCOSE 130 (H) 03/31/2021 1404   BUN 32 (H) 03/31/2021 1404   CREATININE 1.25 (H) 03/31/2021 1404    CALCIUM 9.0 03/31/2021 1404   PROT 8.2 (H) 03/31/2021 1404   ALBUMIN 3.8 03/31/2021 1404   AST 21 03/31/2021 1404   ALT 21 03/31/2021 1404   ALKPHOS 89 03/31/2021 1404   BILITOT 0.3 03/31/2021 1404   GFRNONAA 44 (L) 03/31/2021 1404   GFRAA 59 (L) 07/04/2020 1217    No results found for: SPEP, UPEP  Lab Results  Component Value Date   WBC 6.1 03/31/2021   NEUTROABS 3.7 03/31/2021   HGB 11.5 (L) 03/31/2021   HCT 35.6 (L) 03/31/2021   MCV 92.0 03/31/2021   PLT 196 03/31/2021      Chemistry      Component Value Date/Time   NA 142 03/31/2021 1404   K 3.8 03/31/2021 1404   CL 107 03/31/2021 1404   CO2 24 03/31/2021 1404  BUN 32 (H) 03/31/2021 1404   CREATININE 1.25 (H) 03/31/2021 1404      Component Value Date/Time   CALCIUM 9.0 03/31/2021 1404   ALKPHOS 89 03/31/2021 1404   AST 21 03/31/2021 1404   ALT 21 03/31/2021 1404   BILITOT 0.3 03/31/2021 1404       RADIOGRAPHIC STUDIES: I have reviewed multiple imaging studies with the patient and her daughter I have personally reviewed the radiological images as listed and agreed with the findings in the report. CT CHEST ABDOMEN PELVIS W CONTRAST  Result Date: 03/19/2021 CLINICAL DATA:  80 year old female with ovarian cancer. Increasing CA 125. EXAM: CT CHEST, ABDOMEN, AND PELVIS WITH CONTRAST TECHNIQUE: Multidetector CT imaging of the chest, abdomen and pelvis was performed following the standard protocol during bolus administration of intravenous contrast. CONTRAST:  53m OMNIPAQUE IOHEXOL 300 MG/ML  SOLN COMPARISON:  CT abdomen pelvis dated 01/12/2021 and CT dated 08/29/2020. FINDINGS: CT CHEST FINDINGS Cardiovascular: No cardiomegaly or pericardial effusion. Mild atherosclerotic calcification of the thoracic aorta. No aneurysmal dilatation or dissection. The central pulmonary arteries appear patent. Right-sided Port-A-Cath with tip at the cavoatrial junction. Mediastinum/Nodes: No hilar or mediastinal adenopathy. There is a  moderate size hiatal hernia. Mild thickened appearance of the esophagus may be related to underdistention or represent mild esophagitis related to reflux. No mediastinal fluid collection. Lungs/Pleura: There is a cluster of pulmonary nodules in the superior segment of the right lower lobe with the largest nodule measuring 8 mm (49/4) and new since the CT of 08/29/2020. Although this may represent an inflammatory/infectious etiology, findings concerning for metastatic disease in this patient with rising tumor markers. No focal consolidation, pleural effusion, or pneumothorax. The central airways are patent. Musculoskeletal: Degenerative changes of the spine. No acute osseous pathology. Partially visualized lower cervical ACDF. CT ABDOMEN PELVIS FINDINGS No intra-abdominal free air or free fluid. Hepatobiliary: No focal liver abnormality is seen. No gallstones, gallbladder wall thickening, or biliary dilatation. Pancreas: Unremarkable. No pancreatic ductal dilatation or surrounding inflammatory changes. Spleen: Normal in size without focal abnormality. Adrenals/Urinary Tract: The adrenal glands are unremarkable. The kidneys, visualized ureters, and urinary bladder appear unremarkable. Stomach/Bowel: Moderate size hiatal hernia. There is no bowel obstruction or active inflammation. The appendix is normal. Vascular/Lymphatic: Mild aortoiliac atherosclerotic disease. The IVC is unremarkable. No portal venous gas. No retroperitoneal adenopathy. Interval increased in the size of the nodules of the left omental anterior and inferior to the spleen measuring approximately 3.2 x 1.5 cm (previously 1.6 x 0.8 cm). Several additional omental implants noted in the left upper abdomen (50/2), new since the prior CT. Small omental nodule in the anterior abdomen measures 4 mm (62/2). There is thickening of the left upper peritoneum versus small fluid lateral to the spleen. Reproductive: Hysterectomy. There is a 9 x 10 mm nodule in the  left hemipelvis (86/2), increased in size since the prior CT. Additional rounded nodule along the left pelvic sidewall measures 16 x 13 mm (76/2) and 8 x 10 mm (87/2). Other: None Musculoskeletal: Osteopenia with degenerative changes of the spine. Acute osseous pathology. IMPRESSION: 1. Cluster of pulmonary nodules in the superior segment of the right lower lobe, new since the prior CT and concerning for metastatic disease in this patient with rising tumor markers. 2. Interval increase in the size and number of the omental nodules in the left upper abdomen compared to the prior CT. 3. Several new and enlarging left pelvic nodules as described. 4. Moderate size hiatal hernia. 5. Aortic Atherosclerosis (ICD10-I70.0).  Electronically Signed   By: Anner Crete M.D.   On: 03/19/2021 18:39

## 2021-03-31 NOTE — Assessment & Plan Note (Signed)
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

## 2021-03-31 NOTE — Assessment & Plan Note (Signed)
The patient has rapid disease relapse She is platinum refractory She is aware of poor prognosis and understood why surgery and radiation therapy are not indicated She has made informed decision not to pursue further chemotherapy She is in agreement with palliative care and hospice service; we will set up referral We discussed CODE STATUS and she agreed for DNR I gave her a DNR order We discussed prognosis She would likely succumb to her disease in under 6 months

## 2021-03-31 NOTE — Telephone Encounter (Signed)
Called referral to [facility]. Given info to [place] with intake. They will reach out to [place].

## 2021-04-01 LAB — CA 125: Cancer Antigen (CA) 125: 195 U/mL — ABNORMAL HIGH (ref 0.0–38.1)

## 2021-04-04 ENCOUNTER — Ambulatory Visit: Payer: Medicare Other | Admitting: Hematology and Oncology

## 2021-04-04 ENCOUNTER — Telehealth: Payer: Self-pay

## 2021-04-04 NOTE — Telephone Encounter (Signed)
-----   Message from [name], MD sent at 04/04/2021  9:15 AM EDT ----- Her daughter wanted a phone call about her CA-125  Tell her is high Is she set up with hospice yet?

## 2021-04-04 NOTE — Telephone Encounter (Signed)
Called and given below message to daughter. She verbalized understanding. Given CA-125 results. Hospice will be calling today to set up a time to come out.

## 2021-04-06 ENCOUNTER — Other Ambulatory Visit: Payer: Medicare Other

## 2021-04-07 ENCOUNTER — Other Ambulatory Visit: Payer: Medicare Other

## 2021-05-12 ENCOUNTER — Encounter: Payer: Self-pay | Admitting: Hematology and Oncology

## 2021-05-18 ENCOUNTER — Telehealth: Payer: Self-pay | Admitting: Hematology and Oncology

## 2021-05-18 ENCOUNTER — Encounter: Payer: Self-pay | Admitting: Hematology and Oncology

## 2021-05-18 NOTE — Telephone Encounter (Signed)
Pt's daughter called in to cancel all of pt's upcoming appts. She said her mother has decided to stop her care here. I sent a msg to MD letting her know of pt's decision. Appts were cancelled.

## 2021-05-19 ENCOUNTER — Inpatient Hospital Stay: Payer: Medicare Other

## 2021-05-23 ENCOUNTER — Other Ambulatory Visit: Payer: Self-pay

## 2021-05-23 ENCOUNTER — Encounter: Payer: Self-pay | Admitting: Orthopaedic Surgery

## 2021-05-23 ENCOUNTER — Ambulatory Visit (INDEPENDENT_AMBULATORY_CARE_PROVIDER_SITE_OTHER): Payer: Medicare Other | Admitting: Orthopaedic Surgery

## 2021-05-23 VITALS — BP 149/69 | HR 59 | Ht 60.0 in | Wt 106.3 lb

## 2021-05-23 DIAGNOSIS — M1711 Unilateral primary osteoarthritis, right knee: Secondary | ICD-10-CM | POA: Diagnosis not present

## 2021-05-23 DIAGNOSIS — M170 Bilateral primary osteoarthritis of knee: Secondary | ICD-10-CM

## 2021-05-23 NOTE — Progress Notes (Signed)
Office Visit Note   Patient: Amber Gray           Date of Birth: 10-25-1940           MRN: [id number] Visit Date: 05/23/2021              Requested by: [name], [address] [place],  [place] [id number] PCP: [name], MD   Assessment & Plan: Visit Diagnoses:  1. Bilateral primary osteoarthritis of knee     Plan: Right knee injection performed she can return if she has ongoing problems and if so we will obtain some standing films including femur and knee joint.  She reported some relief after the injection hopefully this will help with her ambulation.  Follow-Up Instructions: No follow-ups on file.   Orders:  Orders Placed This Encounter  Procedures   Large Joint Inj   No orders of the defined types were placed in this encounter.     Procedures: Large Joint Inj: R knee on 05/23/2021 5:08 PM Indications: pain and joint swelling Details: 22 G 1.5 in needle, anterolateral approach  Arthrogram: No  Medications: 40 mg methylPREDNISolone acetate 40 MG/ML; 0.5 mL lidocaine 1 %; 4 mL bupivacaine 0.25 % Outcome: tolerated well, no immediate complications Procedure, treatment alternatives, risks and benefits explained, specific risks discussed. Consent was given by the patient. Immediately prior to procedure a time out was called to verify the correct patient, procedure, equipment, support staff and site/side marked as required. Patient was prepped and draped in the usual sterile fashion.      Clinical Data: No additional findings.   Subjective: Chief Complaint  Patient presents with   Right Knee - Pain   Left Knee - Pain    HPI 80 year old female with right left knee pain.  She states the right knee had popped some and had some difficulty walking.  Previously she had injection in January and states it worked really well and this was in 2020.  Patient states she thought about asking for gel injections that she had her that had been helpful with her  friend.  She not had any true locking.  No hip pain no numbness.  She does have problems with type 2 diabetes stage III kidney disease, ovarian cancer stage IV.  Review of Systems all the systems are noncontributory other than as mentioned above.   Objective: Vital Signs: BP (!) 149/69   Pulse (!) 59   Ht 5' (1.524 m)   Wt 106 lb 4.8 oz (48.2 kg)   BMI 20.76 kg/m   Physical Exam Constitutional:      Appearance: She is well-developed.  HENT:     Head: Normocephalic.     Right Ear: External ear normal.     Left Ear: External ear normal. There is no impacted cerumen.  Eyes:     Pupils: Pupils are equal, round, and reactive to light.  Neck:     Thyroid: No thyromegaly.     Trachea: No tracheal deviation.  Cardiovascular:     Rate and Rhythm: Normal rate.  Pulmonary:     Effort: Pulmonary effort is normal.  Abdominal:     Palpations: Abdomen is soft.  Musculoskeletal:     Cervical back: No rigidity.  Skin:    General: Skin is warm and dry.  Neurological:     Mental Status: She is alert and oriented to person, place, and time.  Psychiatric:        Behavior: Behavior normal.  Ortho Exam patient has tenderness with range of motion of the right knee trace effusion.  Medial joint line tenderness lateral joint line tenderness.  Palpable osteophytes.  Specialty Comments:  No specialty comments available.  Imaging: No results found.   PMFS History: Patient Active Problem List   Diagnosis Date Noted   Goals of care, counseling/discussion 03/31/2021   Genetic testing 12/13/2020   Anemia due to antineoplastic chemotherapy 12/08/2020   Ovarian cancer ([place]) 10/04/2020   Weight loss 08/31/2020   Pancytopenia, acquired ([place]) 07/25/2020   Deficiency anemia 07/25/2020   Generalized weakness 07/25/2020   Bilateral leg edema 06/22/2020   Chronic nausea 06/22/2020   Ovarian CA, left ([place]) 05/30/2020   Other constipation 05/30/2020   Preventive measure 05/30/2020   Acute  pulmonary embolism ([place]) 05/21/2020   Acute respiratory failure with hypoxia ([place]) 05/21/2020   AKI (acute kidney injury) ([place]) 05/21/2020   Lactic acidosis 05/21/2020   Impingement of right ankle joint    Achilles tendinitis, right leg 12/01/2018   Synovitis of knee 02/05/2018   Bilateral primary osteoarthritis of knee 10/15/2017   Sepsis due to pneumonia ([place]) 05/27/2017   CKD (chronic kidney disease), stage III ([place]) 05/27/2017   Diabetes mellitus type 2, uncontrolled ([place]) 05/27/2017   Hypothyroid 05/27/2017   Hypercholesteremia 05/27/2017   Acute hypokalemia 05/27/2017   HTN (hypertension) 05/27/2017   Community acquired pneumonia of left lower lobe of lung    Diabetes mellitus with complication ([place])    Type 2 diabetes mellitus ([place]) 06/27/2015   Hypertension 06/27/2015   Hypothyroidism 06/27/2015   Chest pain 06/27/2015   Precordial chest pain    Past Medical History:  Diagnosis Date   Anxiety    Arthritis    "all over my body" (05/27/2017)   Chronic lower back pain    Depression    Dyspnea    allergy related SOB   Frequent UTI    GERD (gastroesophageal reflux disease)    History of blood transfusion    "when I had my neck fusion"   History of hiatal hernia    Hypercholesteremia    Hypertension    Hypothyroid    Pneumonia 2000s X 1; 05/27/2017   PONV (postoperative nausea and vomiting)    Seasonal allergies    Type II diabetes mellitus ([place])     Family History  Problem Relation Age of Onset   Alzheimer's disease Mother    Heart attack Father    Heart disease Father    Dwarfism Sister    Colon cancer Neg Hx    Colon polyps Neg Hx    Esophageal cancer Neg Hx    Rectal cancer Neg Hx    Stomach cancer Neg Hx     Past Surgical History:  Procedure Laterality Date   ANKLE ARTHROSCOPY Right 03/29/2020   Procedure: RIGHT ANKLE ARTHROSCOPY AND DEBRIDEMENT;  Surgeon: Newt Minion, MD;  Location: [facility];  Service: Orthopedics;  Laterality:  Right;   BACK SURGERY     BREAST BIOPSY Left    CATARACT EXTRACTION W/ INTRAOCULAR LENS  IMPLANT, BILATERAL Bilateral    COLONOSCOPY  10/25/2005   normal   DEBULKING N/A 10/04/2020   Procedure: TUMOR DEBULKING;  Surgeon: Lafonda Mosses, MD;  Location: [facility];  Service: Gynecology;  Laterality: N/A;   DILATION AND CURETTAGE OF UTERUS     IR IMAGING GUIDED PORT INSERTION  05/24/2020   IR PARACENTESIS  05/24/2020   OMENTECTOMY N/A 10/04/2020   Procedure: OMENTECTOMY;  Surgeon: Lafonda Mosses, MD;  Location: [facility];  Service: Gynecology;  Laterality: N/A;   PARTIAL HYSTERECTOMY  1968   POSTERIOR CERVICAL FUSION/FORAMINOTOMY     "fused 4,5,6"   SALPINGOOPHORECTOMY Bilateral 10/04/2020   Procedure: RACICAL OVARIAN CANCER DEBULKING INCLUDING BILATERAL  SALPINGO OOPHORECTOMY AND TRACHELECTOMY;  Surgeon: Lafonda Mosses, MD;  Location: [facility];  Service: Gynecology;  Laterality: Bilateral;   TONSILLECTOMY     Social History   Occupational History   Not on file  Tobacco Use   Smoking status: Former    Packs/day: 1.00    Years: 6.00    Pack years: 6.00    Types: Cigarettes    Quit date: 10/01/1964    Years since quitting: 56.6   Smokeless tobacco: Never  Vaping Use   Vaping Use: Never used  Substance and Sexual Activity   Alcohol use: No   Drug use: No   Sexual activity: Not Currently

## 2021-05-24 MED ORDER — LIDOCAINE HCL 1 % IJ SOLN
0.5000 mL | INTRAMUSCULAR | Status: AC | PRN
  Administered 2021-05-23: .5 mL

## 2021-05-24 MED ORDER — BUPIVACAINE HCL 0.25 % IJ SOLN
4.0000 mL | INTRAMUSCULAR | Status: AC | PRN
  Administered 2021-05-23: 4 mL via INTRA_ARTICULAR

## 2021-05-24 MED ORDER — METHYLPREDNISOLONE ACETATE 40 MG/ML IJ SUSP
40.0000 mg | INTRAMUSCULAR | Status: AC | PRN
  Administered 2021-05-23: 40 mg via INTRA_ARTICULAR

## 2021-06-23 ENCOUNTER — Encounter: Payer: Self-pay | Admitting: Hematology and Oncology

## 2021-06-29 ENCOUNTER — Other Ambulatory Visit: Payer: Medicare Other

## 2021-06-30 ENCOUNTER — Other Ambulatory Visit: Payer: Medicare Other

## 2021-06-30 ENCOUNTER — Telehealth: Payer: Self-pay | Admitting: Oncology

## 2021-06-30 ENCOUNTER — Ambulatory Visit: Payer: Medicare Other | Admitting: Gynecologic Oncology

## 2021-06-30 NOTE — Telephone Encounter (Signed)
Called Blanch Media (daughter) and rescheduled appointment to 07/14/2021. She said Cj is in hospice but that it may be good to follow up with Dr. Berline Lopes due to some questions that Neda is asking about whether her cancer is growing.

## 2021-07-12 ENCOUNTER — Encounter: Payer: Self-pay | Admitting: Hematology and Oncology

## 2021-07-13 ENCOUNTER — Encounter: Payer: Self-pay | Admitting: Hematology and Oncology

## 2021-07-14 ENCOUNTER — Inpatient Hospital Stay: Attending: Hematology and Oncology | Admitting: Gynecologic Oncology

## 2021-07-14 ENCOUNTER — Other Ambulatory Visit: Payer: Self-pay

## 2021-07-14 ENCOUNTER — Encounter: Payer: Self-pay | Admitting: Gynecologic Oncology

## 2021-07-14 VITALS — BP 132/67 | HR 64 | Temp 97.6°F | Resp 18 | Ht 60.0 in | Wt 110.8 lb

## 2021-07-14 DIAGNOSIS — Z86718 Personal history of other venous thrombosis and embolism: Secondary | ICD-10-CM | POA: Insufficient documentation

## 2021-07-14 DIAGNOSIS — R197 Diarrhea, unspecified: Secondary | ICD-10-CM | POA: Diagnosis not present

## 2021-07-14 DIAGNOSIS — Z79899 Other long term (current) drug therapy: Secondary | ICD-10-CM | POA: Diagnosis not present

## 2021-07-14 DIAGNOSIS — Z7901 Long term (current) use of anticoagulants: Secondary | ICD-10-CM | POA: Diagnosis not present

## 2021-07-14 DIAGNOSIS — C786 Secondary malignant neoplasm of retroperitoneum and peritoneum: Secondary | ICD-10-CM | POA: Diagnosis not present

## 2021-07-14 DIAGNOSIS — C562 Malignant neoplasm of left ovary: Secondary | ICD-10-CM | POA: Diagnosis present

## 2021-07-14 DIAGNOSIS — Z9221 Personal history of antineoplastic chemotherapy: Secondary | ICD-10-CM | POA: Insufficient documentation

## 2021-07-14 DIAGNOSIS — Z90722 Acquired absence of ovaries, bilateral: Secondary | ICD-10-CM | POA: Insufficient documentation

## 2021-07-14 DIAGNOSIS — Z90710 Acquired absence of both cervix and uterus: Secondary | ICD-10-CM | POA: Insufficient documentation

## 2021-07-14 DIAGNOSIS — C569 Malignant neoplasm of unspecified ovary: Secondary | ICD-10-CM

## 2021-07-14 NOTE — Patient Instructions (Signed)
Great to see you! I'm glad you are feeling so well.  Absolutely fine with me for you to get COVID (or other vaccines).  Please call if/when you would like an inperson visit or phone visit.

## 2021-07-14 NOTE — Progress Notes (Signed)
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  CT imaging also revealed abdominal mass.  Subsequent biopsy confirmed diagnosis of ovarian cancer   05/21/2020 Procedure   Successful ultrasound guided diagnostic and therapeutic left thoracentesis yielding 580 cc of pleural fluid.   05/21/2020 Imaging   1. Large saddle pulmonary embolus with CT evidence of right heart strain (RV/LV ratio of 1.6) consistent with at least submassive (intermediate risk) PE. The presence of right heart strain has been associated with an increased risk of morbidity and mortality. 2. Large left and small right pleural effusions. 3. Large amount of peritoneal nodularity, consistent with peritoneal carcinomatosis. 4. Large midline pelvic mass, measuring up to 12.9 x 8.7 x 9.7 cm. 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   05/21/2020 Pathology Results   Clinical History: None provided  Specimen Submitted:  A. PLEURAL FLUID,LEFT, THORACENTESIS:    FINAL MICROSCOPIC DIAGNOSIS:  - Reactive mesothelial cells present   05/24/2020 Procedure   1. Successful placement of a right internal jugular approach power injectable Port-A-Cath. The catheter is ready for immediate use. 2. Successful ultrasound-guided paracentesis yielding 2.2 L of serous, slightly blood tinged ascitic fluid.   05/24/2020 Pathology Results   A. OMENTUM, NEEDLE CORE BIOPSY:  -  High-grade carcinoma  -  See comment   COMMENT:   By immunohistochemistry,  the neoplastic cells are positive for cytokeratin 7 and PAX 8 but negative for cytokeratin 20, CDX2 and TTF-1. 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    05/30/2020 Cancer Staging   Staging form: Ovary, Fallopian Tube, and Primary Peritoneal Carcinoma, AJCC 8th Edition - Clinical stage from 05/30/2020: FIGO Stage IIIC (cT3, cN0, cM0) - Signed by [name], MD on 05/30/2020   06/10/2020 Tumor Marker   Patient's tumor was tested for the following markers: CA-125. Results of the tumor marker test revealed 27,432.   06/14/2020 - 12/08/2020 Chemotherapy   The patient had carboplatin and taxol for chemotherapy treatment.     07/04/2020 Tumor Marker   Patient's tumor was tested for the following markers: CA-125 Results of the tumor marker test revealed [id number]   08/29/2020 Imaging   CT chest:   1. No evidence of thoracic metastasis. 2. Resolution of proximal pulmonary emboli.   CT abdomen pelvis:   1. Marked reduction in volume of bulky pelvic mass. Nodularity remains along the uterine fundus. 2. Marked reduction in the nodular portions of the omental thickening. Cystic portions of the omental metastasis remain. 3. Decrease in volume of free fluid in the abdomen pelvis. 4. No new nodularity in the peritoneal space.     08/30/2020 Tumor Marker   Patient's tumor was tested for the following markers: CA-125 Results of the tumor marker test revealed 359   10/04/2020 Surgery   Operation: Exlap, radical tumor debulking including omentectomy, BSO, trachelectomy   Surgeon: [name]    Operative Findings: On EUA, cervix not definitively appreciated. Nodularity and somewhat fixed mass noted in the cul de  sac. On intra-abdominal entry, normal and smooth diaphragm, stomach, liver edge. Omentum densely adherent to the transverse colon with two 3-5cm cystic masses vs treated tumor within the infracolic portion of the  mass.  One of these masses was densely adherent to the anterior abdominal wall in the left upper abdomen.  Otherwise, omentum without evidence of disease. Small bowel normal in appearance. No adenopathy. No ascites. Bilateral tubes normal appearing. Ovaries in conglomerate mass with cervix. Mass densely adherent to the rectum posteriorly. Bladder adherent anteriorly to the cervix and superior to the cervix/ovarian mass conglomeration. After resection of the cervix and bilateral adnexa, the remaining nodules were able to be removed from the rectum.  Bubble test was negative for disruption of the rectum.  No palpable or visible tumor at the end of surgery (R0 resection)     10/04/2020 Pathology Results   SURGICAL PATHOLOGY  CASE: [id number]  PATIENT: Amber Gray  Surgical Pathology Report   FINAL MICROSCOPIC DIAGNOSIS:   A. 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   B. 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  - Tumor involves left ovary, right fallopian tube, uterine serosa, uterine wall (myometrium) and rectal implant.  - See oncology table.   ONCOLOGY TABLE:   OVARY or FALLOPIAN TUBE or PRIMARY PERITONEUM: Resection   Procedure: Total hysterectomy and bilateral salpingo-oophorectomy,  Excision of rectal implant  Specimen Integrity: Intact  Tumor Site: Left ovary  Tumor Size: Multifocal, largest focus measures 2.1 cm  Histologic Type: Serous carcinoma  Histologic Grade: High grade  Ovarian Surface Involvement: Present, left ovary  Fallopian Tube Surface Involvement: Present, right fallopian tube  Implants: Present, rectum  Other Tissue/ Organ Involvement: Uterine serosa, uterine wall  (myometrium)  Largest Extrapelvic Peritoneal  Focus: 5 cm  Peritoneal/Ascitic Fluid Involvement: Not applicable  Chemotherapy Response Score (CRS): CRS2 (moderate response)  Regional Lymph Nodes: Not applicable (no regional lymph nodes submitted  or found)  Distant Metastasis:       Distant Site(s) Involved: Not applicable  Pathologic Stage Classification (pTNM, AJCC 8th Edition): ypT3c, pN not assigned  Additional Findings: Mild acute and chronic cervicitis.  Attenuated inactive endometrium with scattered cystically dilated glands.  Leiomyomata.    11/02/2020 Tumor Marker   Patient's tumor was tested for the following markers: CA-125 Results of the tumor marker test revealed 29.9.   12/09/2020 Tumor Marker   Patient's tumor was tested for the following markers: CA-125 Results of the tumor marker test revealed 22.1   12/12/2020 Genetic Testing   Negative genetic testing on the CancerNext panel.  The CancerNext gene panel offered by [employer] includes sequencing and rearrangement analysis for the following 34 genes:   APC, ATM, BARD1, BMPR1A, BRCA1, BRCA2, BRIP1, CDH1, CDK4, CDKN2A, CHEK2, DICER1, HOXB13, EPCAM, GREM1, MLH1, MRE11A, MSH2, MSH6, MUTYH, NBN, NF1, PALB2, PMS2, POLD1, POLE, PTEN, RAD50, RAD51C, RAD51D, SMAD4, SMARCA4, STK11, and TP53.    Negative genetic testing on the HRD tumor testing, suggesting that the patient's tumor will not be more sensitive to PARP inhibitors.  The report date is December 12, 2020.   01/12/2021 Tumor Marker   Patient's tumor was tested for the following markers: CA-125 Results of the tumor marker test revealed 25.7   01/13/2021 Imaging   1. Status post interval hysterectomy and bilateral oophorectomy. 2. Status post interval omentectomy,  with excision of previously noted cystic lesions along the ventral left hemiabdomen. 3. There is persistent nodularity and peritoneal thickening in the left upper quadrant, consistent with unchanged peritoneal metastatic disease. 4. No evidence of lymphadenopathy  or new metastatic disease in the abdomen or pelvis on this postoperative baseline 5. Moderate hiatal hernia with intrathoracic position of the gastric fundus.   Aortic Atherosclerosis (ICD10-I70.0).   02/24/2021 Tumor Marker   Patient's tumor was tested for the following markers: CA-125 Results of the tumor marker test revealed 46.7   03/10/2021 Tumor Marker   Patient's tumor was tested for the following markers: CA-125. Results of the tumor marker test revealed 82.9.   03/17/2021 Imaging   1. Cluster of pulmonary nodules in the superior segment of the right lower lobe, new since the prior CT and concerning for metastatic disease in this patient with rising tumor markers. 2. Interval increase in the size and number of the omental nodules in the left upper abdomen compared to the prior CT. 3. Several new and enlarging left pelvic nodules as described. 4. Moderate size hiatal hernia. 5. Aortic Atherosclerosis (ICD10-I70.0).       Interval History: Patient presents for follow-up today with her daughter.  She notes overall doing very well.  Since August, she denies any abdominal or pelvic pain.  She endorses a good appetite and has been able to gain some weight.  She denies any nausea or emesis.  She has intermittent diarrhea.  She describes having 2-3 bowel movements a day, which are mostly solid, although at times loose or diarrhea in consistency.  She denies any bladder symptoms.  She denies any vaginal bleeding or discharge.  She has been doing a boost as well as a Glucerna.  She was switched to Glucerna in the hopes that this might decrease her diarrhea.  She has some right knee pain and is going to be evaluated next week for possible repeat steroid shot.  She has had steroid injections before and has had significant relief from them.  Past Medical/Surgical History: Past Medical History:  Diagnosis Date   Anxiety    Arthritis    "all over my body" (05/27/2017)   Chronic lower back pain     Depression    Dyspnea    allergy related SOB   Frequent UTI    GERD (gastroesophageal reflux disease)    History of blood transfusion    "when I had my neck fusion"   History of hiatal hernia    Hypercholesteremia    Hypertension    Hypothyroid    Pneumonia 2000s X 1; 05/27/2017   PONV (postoperative nausea and vomiting)    Seasonal allergies    Type II diabetes mellitus ([place])     Past Surgical History:  Procedure Laterality Date   ANKLE ARTHROSCOPY Right 03/29/2020   Procedure: RIGHT ANKLE ARTHROSCOPY AND DEBRIDEMENT;  Surgeon: Newt Minion, MD;  Location: [facility];  Service: Orthopedics;  Laterality: Right;   BACK SURGERY     BREAST BIOPSY Left    CATARACT EXTRACTION W/ INTRAOCULAR LENS  IMPLANT, BILATERAL Bilateral    COLONOSCOPY  10/25/2005   normal   DEBULKING N/A 10/04/2020   Procedure: TUMOR DEBULKING;  Surgeon: Lafonda Mosses, MD;  Location: [facility];  Service: Gynecology;  Laterality: N/A;   DILATION AND CURETTAGE OF UTERUS     IR IMAGING GUIDED PORT INSERTION  05/24/2020   IR PARACENTESIS  05/24/2020   OMENTECTOMY N/A 10/04/2020   Procedure: OMENTECTOMY;  Surgeon: Lafonda Mosses, MD;  Location: [facility];  Service: Gynecology;  Laterality: N/A;   PARTIAL HYSTERECTOMY  1968   POSTERIOR CERVICAL FUSION/FORAMINOTOMY     "fused 4,5,6"   SALPINGOOPHORECTOMY Bilateral 10/04/2020   Procedure: RACICAL OVARIAN CANCER DEBULKING INCLUDING BILATERAL  SALPINGO OOPHORECTOMY AND TRACHELECTOMY;  Surgeon: Lafonda Mosses, MD;  Location: [facility];  Service: Gynecology;  Laterality: Bilateral;   TONSILLECTOMY      Family History  Problem Relation Age of Onset   Alzheimer's disease Mother    Heart attack Father    Heart disease Father    Dwarfism Sister    Colon cancer Neg Hx    Colon polyps Neg Hx    Esophageal cancer Neg Hx    Rectal cancer Neg Hx    Stomach cancer Neg Hx     Social History   Socioeconomic History   Marital status: Divorced     Spouse name: Not on file   Number of children: Not on file   Years of education: Not on file   Highest education level: Not on file  Occupational History   Not on file  Tobacco Use   Smoking status: Former    Packs/day: 1.00    Years: 6.00    Pack years: 6.00    Types: Cigarettes    Quit date: 10/01/1964    Years since quitting: 56.8   Smokeless tobacco: Never  Vaping Use   Vaping Use: Never used  Substance and Sexual Activity   Alcohol use: No   Drug use: No   Sexual activity: Not Currently  Other Topics Concern   Not on file  Social History Narrative   Not on file   Social Determinants of Health   Financial Resource Strain: Not on file  Food Insecurity: Not on file  Transportation Needs: Not on file  Physical Activity: Not on file  Stress: Not on file  Social Connections: Not on file    Current Medications:  Current Outpatient Medications:    acetaminophen (TYLENOL) 325 MG tablet, Take 650 mg by mouth every 6 (six) hours as needed for mild pain., Disp: , Rfl:    apixaban (ELIQUIS) 2.5 MG TABS tablet, Take 2.5 mg by mouth 2 (two) times daily., Disp: , Rfl:    furosemide (LASIX) 40 MG tablet, Take 40 mg by mouth daily. , Disp: , Rfl: 11   levothyroxine (SYNTHROID) 88 MCG tablet, Take 88 mcg by mouth daily before breakfast. , Disp: , Rfl:    lidocaine-prilocaine (EMLA) cream, Apply 1 application topically daily as needed., Disp: 30 g, Rfl: 11   omeprazole (PRILOSEC) 20 MG capsule, Take 20 mg by mouth daily., Disp: , Rfl:    sertraline (ZOLOFT) 50 MG tablet, Take 50 mg by mouth daily., Disp: , Rfl: 0   simvastatin (ZOCOR) 40 MG tablet, Take 40 mg by mouth daily., Disp: , Rfl: 3   temazepam (RESTORIL) 7.5 MG capsule, Take 7.5 mg by mouth at bedtime., Disp: , Rfl:    TRADJENTA 5 MG TABS tablet, Take 5 mg by mouth daily after supper., Disp: , Rfl: 3   empagliflozin (JARDIANCE) 10 MG TABS tablet, Take 10 mg by mouth every other day. (Patient not taking: Reported on  07/14/2021), Disp: , Rfl:    LINZESS 72 MCG capsule, Take 72 mcg by mouth daily as needed (Constipation). (Patient not taking: Reported on 07/14/2021), Disp: , Rfl:    melatonin 5 MG TABS, Take 5 mg by mouth at bedtime as needed (  Sleep). (Patient not taking: Reported on 07/14/2021), Disp: , Rfl:    senna-docusate (SENOKOT-S) 8.6-50 MG tablet, Take 2 tablets by mouth at bedtime. For AFTER surgery, do not take if having diarrhea (Patient not taking: Reported on 07/14/2021), Disp: 30 tablet, Rfl: 0  Review of Systems: Pertinent positives as per HPI.  Voice changes, and anxiety also endorsed. Denies appetite changes, fevers, chills, fatigue, unexplained weight changes. Denies hearing loss, neck lumps or masses, mouth sores, ringing in ears. Denies cough or wheezing.  Denies shortness of breath. Denies chest pain or palpitations. Denies leg swelling. Denies abdominal distention, pain, blood in stools, constipation, nausea, vomiting, or early satiety. Denies pain with intercourse, dysuria, frequency, hematuria or incontinence. Denies hot flashes, pelvic pain, vaginal bleeding or vaginal discharge.   Denies back pain or muscle pain/cramps. Denies itching, rash, or wounds. Denies dizziness, headaches, numbness or seizures. Denies swollen lymph nodes or glands, denies easy bruising or bleeding. Denies depression, confusion, or decreased concentration.  Physical Exam: BP 132/67 (BP Location: Left Arm, Patient Position: Sitting)   Pulse 64   Temp 97.6 F (36.4 C) (Oral)   Resp 18   Ht 5' (1.524 m)   Wt 110 lb 12.8 oz (50.3 kg)   SpO2 100%   BMI 21.64 kg/m  General: Alert, oriented, no acute distress. HEENT: Normocephalic, atraumatic, sclera anicteric. Chest: Unlabored breathing on room air.  Laboratory & Radiologic Studies: None new  Assessment & Plan: Amber Gray is a 80 y.o. woman with platinum resistant ovarian cancer now on hospice who is overall doing very well.  Patient  has excellent quality of life at the moment and appears and feels better than she has since I met her prior to initiation of treatment.  We discussed today that the cancer will continue to progress and that she will ultimately develop symptoms, but I am very happy to hear how well she has been doing.  She and her daughter are both finding having hospice services beneficial.  She has some questions about diarrhea, which we discussed can sometimes be a sign of partial bowel obstruction.  She is unable to go to church and sometimes does not want to leave the house given fear that she will have an accident.  I think it would be reasonable for her to use an antidiarrhea medication prior to leaving the house.  Discussed some other dietary changes that may help with her bowel function.  She also asked about getting vaccines.  From my standpoint, she can get any and all vaccines that she wants including COVID and pneumonia.  She had a reaction to the flu vaccine last year and we discussed that she could have a reaction again this year if she chooses to get the flu vaccine.  In terms of follow-up, I offered that we could schedule follow-up, either in person or by phone.  We could also plan that if the patient would like to discuss anything more to see me, that she or her daughter could call the office to schedule follow-up as needed.  They prefer the second option and will let us know when and if they would like to have a follow-up visit or phone visit with me.  38 minutes of total time was spent for this patient encounter, including preparation, face-to-face counseling with the patient and coordination of care, and documentation of the encounter.  Jeral Pinch, MD  Division of Gynecologic Oncology  Department of Obstetrics and Gynecology  [facility]

## 2021-07-18 ENCOUNTER — Ambulatory Visit: Payer: Medicare Other | Admitting: Orthopaedic Surgery

## 2021-07-21 ENCOUNTER — Ambulatory Visit (INDEPENDENT_AMBULATORY_CARE_PROVIDER_SITE_OTHER): Payer: Medicare Other | Admitting: Orthopaedic Surgery

## 2021-07-21 ENCOUNTER — Encounter: Payer: Self-pay | Admitting: Orthopaedic Surgery

## 2021-07-21 ENCOUNTER — Other Ambulatory Visit: Payer: Self-pay

## 2021-07-21 DIAGNOSIS — M5459 Other low back pain: Secondary | ICD-10-CM | POA: Diagnosis not present

## 2021-07-21 DIAGNOSIS — M25551 Pain in right hip: Secondary | ICD-10-CM

## 2021-07-21 DIAGNOSIS — M659 Synovitis and tenosynovitis, unspecified: Secondary | ICD-10-CM | POA: Diagnosis not present

## 2021-07-21 DIAGNOSIS — M25561 Pain in right knee: Secondary | ICD-10-CM

## 2021-07-21 NOTE — Progress Notes (Signed)
Office Visit Note   Patient: Amber Gray           Date of Birth: [birth year]           MRN: [id number] Visit Date: 07/21/2021              Requested by: [name], [place],  [place] [id number] PCP: [name], MD   Assessment & Plan: Visit Diagnoses:  1. Synovitis of right knee   2. Synovitis of knee     Plan: Knee injection performed.  If she has recurrent symptoms she can return we will obtain hip x-rays on the right and also knee x-rays on the right.  Post injection she got good relief.  Follow-Up Instructions: No follow-ups on file.   Orders:  Orders Placed This Encounter  Procedures   Large Joint Inj: R knee   No orders of the defined types were placed in this encounter.     Procedures: Large Joint Inj: R knee on 07/21/2021 2:34 PM Indications: pain and joint swelling Details: 22 G 1.5 in needle, anterolateral approach  Arthrogram: No  Medications: 40 mg methylPREDNISolone acetate 40 MG/ML; 0.5 mL lidocaine 1 %; 4 mL bupivacaine 0.25 % Outcome: tolerated well, no immediate complications Procedure, treatment alternatives, risks and benefits explained, specific risks discussed. Consent was given by the patient. Immediately prior to procedure a time out was called to verify the correct patient, procedure, equipment, support staff and site/side marked as required. Patient was prepped and draped in the usual sterile fashion.      Clinical Data: No additional findings.   Subjective: Chief Complaint  Patient presents with   Right Knee - Pain   Lower Back - Pain   Right Hip - Pain    HPI 80 year old female with metastatic ovarian cancer who had stopped chemo due to side effects and weight loss now gaining some weight presents with recurrent problems of the right knee.  Previous injection given in 2020.  She denies any groin pain.  Mild knee swelling with activities and states the previous injection 2 months ago did help her knee.  She  is requesting repeat injection.  She denies numbness or tingling in her foot.  She is here with her sister.  Review of Systems all other systems updated noncontributory.   Objective: Vital Signs: There were no vitals taken for this visit.  Physical Exam Constitutional:      Appearance: She is well-developed.  HENT:     Head: Normocephalic.     Right Ear: External ear normal.     Left Ear: External ear normal. There is no impacted cerumen.  Eyes:     Pupils: Pupils are equal, round, and reactive to light.  Neck:     Thyroid: No thyromegaly.     Trachea: No tracheal deviation.  Cardiovascular:     Rate and Rhythm: Normal rate.  Pulmonary:     Effort: Pulmonary effort is normal.  Abdominal:     Palpations: Abdomen is soft.  Musculoskeletal:     Cervical back: No rigidity.  Skin:    General: Skin is warm and dry.  Neurological:     Mental Status: She is alert and oriented to person, place, and time.  Psychiatric:        Behavior: Behavior normal.    Ortho Exam collateral ligaments are stable in her knee she has some mild crepitus with flexion extension worse on the right knee than left knee.  Negative  logroll the hips.  No pain with internal rotation of the hip.  Specialty Comments:  No specialty comments available.  Imaging: No results found.   PMFS History: Patient Active Problem List   Diagnosis Date Noted   Goals of care, counseling/discussion 03/31/2021   Genetic testing 12/13/2020   Anemia due to antineoplastic chemotherapy 12/08/2020   Ovarian cancer ([facility]) 10/04/2020   Weight loss 08/31/2020   Pancytopenia, acquired ([facility]) 07/25/2020   Deficiency anemia 07/25/2020   Generalized weakness 07/25/2020   Bilateral leg edema 06/22/2020   Chronic nausea 06/22/2020   Ovarian CA, left ([facility]) 05/30/2020   Other constipation 05/30/2020   Preventive measure 05/30/2020   Acute pulmonary embolism ([facility]) 05/21/2020   Acute respiratory failure with hypoxia ([facility])  05/21/2020   AKI (acute kidney injury) ([facility]) 05/21/2020   Lactic acidosis 05/21/2020   Impingement of right ankle joint    Achilles tendinitis, right leg 12/01/2018   Synovitis of knee 02/05/2018   Bilateral primary osteoarthritis of knee 10/15/2017   Sepsis due to pneumonia ([facility]) 05/27/2017   CKD (chronic kidney disease), stage III ([facility]) 05/27/2017   Diabetes mellitus type 2, uncontrolled 05/27/2017   Hypothyroid 05/27/2017   Hypercholesteremia 05/27/2017   Acute hypokalemia 05/27/2017   HTN (hypertension) 05/27/2017   Community acquired pneumonia of left lower lobe of lung    Diabetes mellitus with complication ([facility])    Type 2 diabetes mellitus ([facility]) 06/27/2015   Hypertension 06/27/2015   Hypothyroidism 06/27/2015   Chest pain 06/27/2015   Precordial chest pain    Past Medical History:  Diagnosis Date   Anxiety    Arthritis    "all over my body" (05/27/2017)   Chronic lower back pain    Depression    Dyspnea    allergy related SOB   Frequent UTI    GERD (gastroesophageal reflux disease)    History of blood transfusion    "when I had my neck fusion"   History of hiatal hernia    Hypercholesteremia    Hypertension    Hypothyroid    Pneumonia 2000s X 1; 05/27/2017   PONV (postoperative nausea and vomiting)    Seasonal allergies    Type II diabetes mellitus ([facility])     Family History  Problem Relation Age of Onset   Alzheimer's disease Mother    Heart attack Father    Heart disease Father    Dwarfism Sister    Colon cancer Neg Hx    Colon polyps Neg Hx    Esophageal cancer Neg Hx    Rectal cancer Neg Hx    Stomach cancer Neg Hx     Past Surgical History:  Procedure Laterality Date   ANKLE ARTHROSCOPY Right 03/29/2020   Procedure: RIGHT ANKLE ARTHROSCOPY AND DEBRIDEMENT;  Surgeon: Newt Minion, MD;  Location: [facility];  Service: Orthopedics;  Laterality: Right;   BACK SURGERY     BREAST BIOPSY Left    CATARACT EXTRACTION W/ INTRAOCULAR LENS   IMPLANT, BILATERAL Bilateral    COLONOSCOPY  10/25/2005   normal   DEBULKING N/A 10/04/2020   Procedure: TUMOR DEBULKING;  Surgeon: Lafonda Mosses, MD;  Location: [facility];  Service: Gynecology;  Laterality: N/A;   DILATION AND CURETTAGE OF UTERUS     IR IMAGING GUIDED PORT INSERTION  05/24/2020   IR PARACENTESIS  05/24/2020   OMENTECTOMY N/A 10/04/2020   Procedure: OMENTECTOMY;  Surgeon: Lafonda Mosses, MD;  Location: [facility];  Service: Gynecology;  Laterality: N/A;  PARTIAL HYSTERECTOMY  1968   POSTERIOR CERVICAL FUSION/FORAMINOTOMY     "fused 4,5,6"   SALPINGOOPHORECTOMY Bilateral 10/04/2020   Procedure: RACICAL OVARIAN CANCER DEBULKING INCLUDING BILATERAL  SALPINGO OOPHORECTOMY AND TRACHELECTOMY;  Surgeon: Lafonda Mosses, MD;  Location: [facility];  Service: Gynecology;  Laterality: Bilateral;   TONSILLECTOMY     Social History   Occupational History   Not on file  Tobacco Use   Smoking status: Former    Packs/day: 1.00    Years: 6.00    Pack years: 6.00    Types: Cigarettes    Quit date: 10/01/1964    Years since quitting: 56.8   Smokeless tobacco: Never  Vaping Use   Vaping Use: Never used  Substance and Sexual Activity   Alcohol use: No   Drug use: No   Sexual activity: Not Currently

## 2021-07-24 MED ORDER — BUPIVACAINE HCL 0.25 % IJ SOLN
4.0000 mL | INTRAMUSCULAR | Status: AC | PRN
  Administered 2021-07-21: 4 mL via INTRA_ARTICULAR

## 2021-07-24 MED ORDER — LIDOCAINE HCL 1 % IJ SOLN
0.5000 mL | INTRAMUSCULAR | Status: AC | PRN
  Administered 2021-07-21: .5 mL

## 2021-07-24 MED ORDER — METHYLPREDNISOLONE ACETATE 40 MG/ML IJ SUSP
40.0000 mg | INTRAMUSCULAR | Status: AC | PRN
  Administered 2021-07-21: 40 mg via INTRA_ARTICULAR

## 2021-08-10 ENCOUNTER — Ambulatory Visit: Payer: Medicare Other | Admitting: Hematology and Oncology

## 2021-08-10 ENCOUNTER — Other Ambulatory Visit: Payer: Medicare Other

## 2021-08-17 ENCOUNTER — Encounter: Payer: Self-pay | Admitting: Hematology and Oncology

## 2021-09-07 ENCOUNTER — Encounter: Payer: Self-pay | Admitting: Hematology and Oncology

## 2021-09-21 ENCOUNTER — Other Ambulatory Visit (HOSPITAL_COMMUNITY): Payer: Self-pay | Admitting: Hospice and Palliative Medicine

## 2021-09-21 ENCOUNTER — Encounter: Payer: Self-pay | Admitting: Hematology and Oncology

## 2021-09-21 DIAGNOSIS — R180 Malignant ascites: Secondary | ICD-10-CM

## 2021-09-22 ENCOUNTER — Ambulatory Visit (HOSPITAL_COMMUNITY): Payer: Medicare Other

## 2021-09-22 ENCOUNTER — Other Ambulatory Visit: Payer: Self-pay

## 2021-09-22 ENCOUNTER — Encounter: Payer: Self-pay | Admitting: Hematology and Oncology

## 2021-09-22 ENCOUNTER — Ambulatory Visit (HOSPITAL_COMMUNITY)
Admission: RE | Admit: 2021-09-22 | Discharge: 2021-09-22 | Disposition: A | Discharge status: Home / Self Care | Payer: Medicare Other | Source: Ambulatory Visit | Attending: Hospice and Palliative Medicine | Admitting: Hospice and Palliative Medicine

## 2021-09-22 DIAGNOSIS — R188 Other ascites: Secondary | ICD-10-CM | POA: Diagnosis not present

## 2021-09-22 DIAGNOSIS — R180 Malignant ascites: Secondary | ICD-10-CM

## 2021-09-22 HISTORY — PX: IR PARACENTESIS: IMG2679

## 2021-09-22 MED ORDER — LIDOCAINE HCL 1 % IJ SOLN
INTRAMUSCULAR | Status: AC
  Filled 2021-09-22: qty 20

## 2021-09-22 MED ORDER — LIDOCAINE HCL 1 % IJ SOLN
INTRAMUSCULAR | Status: DC | PRN
  Administered 2021-09-22: 20 mL

## 2021-09-22 NOTE — Procedures (Signed)
Ultrasound-guided herapeutic paracentesis performed yielding 2.9 liters of amber colored fluid.  No immediate complications. EBL is none.

## 2021-09-28 ENCOUNTER — Encounter: Payer: Self-pay | Admitting: Hematology and Oncology

## 2021-10-27 ENCOUNTER — Ambulatory Visit: Payer: Medicare Other | Admitting: Surgery

## 2021-11-01 DEATH — deceased

## 2021-11-13 ENCOUNTER — Encounter: Payer: Self-pay | Admitting: Hematology and Oncology

## 2021-11-21 ENCOUNTER — Encounter: Payer: Self-pay | Admitting: Hematology and Oncology

## 2023-08-21 NOTE — Telephone Encounter (Signed)
Telephone call
